# Patient Record
Sex: Female | Born: 1935 | Race: Black or African American | Hispanic: No | Smoking: Never smoker
Health system: Southern US, Community
[De-identification: ages and names within clinical notes are randomized; demographics above are authoritative.]

## PROBLEM LIST (undated history)

## (undated) ENCOUNTER — Emergency Department (HOSPITAL_COMMUNITY): Admission: EM | Payer: Medicare Other | Source: Home / Self Care

## (undated) DIAGNOSIS — I739 Peripheral vascular disease, unspecified: Secondary | ICD-10-CM

## (undated) DIAGNOSIS — F39 Unspecified mood [affective] disorder: Secondary | ICD-10-CM

## (undated) DIAGNOSIS — M1990 Unspecified osteoarthritis, unspecified site: Secondary | ICD-10-CM

## (undated) DIAGNOSIS — E7800 Pure hypercholesterolemia, unspecified: Secondary | ICD-10-CM

## (undated) DIAGNOSIS — I872 Venous insufficiency (chronic) (peripheral): Secondary | ICD-10-CM

## (undated) DIAGNOSIS — I69398 Other sequelae of cerebral infarction: Principal | ICD-10-CM

## (undated) DIAGNOSIS — R1310 Dysphagia, unspecified: Secondary | ICD-10-CM

## (undated) DIAGNOSIS — N390 Urinary tract infection, site not specified: Secondary | ICD-10-CM

## (undated) DIAGNOSIS — N189 Chronic kidney disease, unspecified: Secondary | ICD-10-CM

## (undated) DIAGNOSIS — R42 Dizziness and giddiness: Secondary | ICD-10-CM

## (undated) DIAGNOSIS — I6529 Occlusion and stenosis of unspecified carotid artery: Secondary | ICD-10-CM

## (undated) DIAGNOSIS — K219 Gastro-esophageal reflux disease without esophagitis: Secondary | ICD-10-CM

## (undated) DIAGNOSIS — T4145XA Adverse effect of unspecified anesthetic, initial encounter: Secondary | ICD-10-CM

## (undated) DIAGNOSIS — M109 Gout, unspecified: Secondary | ICD-10-CM

## (undated) DIAGNOSIS — I10 Essential (primary) hypertension: Secondary | ICD-10-CM

## (undated) DIAGNOSIS — T8859XA Other complications of anesthesia, initial encounter: Secondary | ICD-10-CM

## (undated) DIAGNOSIS — I69359 Hemiplegia and hemiparesis following cerebral infarction affecting unspecified side: Secondary | ICD-10-CM

## (undated) DIAGNOSIS — E1140 Type 2 diabetes mellitus with diabetic neuropathy, unspecified: Secondary | ICD-10-CM

## (undated) HISTORY — DX: Chronic kidney disease, unspecified: N18.9

## (undated) HISTORY — DX: Venous insufficiency (chronic) (peripheral): I87.2

## (undated) HISTORY — DX: Occlusion and stenosis of unspecified carotid artery: I65.29

## (undated) HISTORY — PX: ABDOMINAL HYSTERECTOMY: SHX81

## (undated) HISTORY — DX: Other sequelae of cerebral infarction: I69.398

## (undated) HISTORY — DX: Dizziness and giddiness: R42

## (undated) HISTORY — DX: Unspecified mood (affective) disorder: F39

## (undated) HISTORY — DX: Hemiplegia and hemiparesis following cerebral infarction affecting unspecified side: I69.359

## (undated) HISTORY — PX: CARDIAC CATHETERIZATION: SHX172

## (undated) HISTORY — DX: Unspecified osteoarthritis, unspecified site: M19.90

## (undated) HISTORY — DX: Dysphagia, unspecified: R13.10

## (undated) HISTORY — DX: Gastro-esophageal reflux disease without esophagitis: K21.9

## (undated) HISTORY — PX: HEMORRHOID BANDING: SHX5850

---

## 1997-08-10 ENCOUNTER — Emergency Department (HOSPITAL_COMMUNITY): Admission: EM | Admit: 1997-08-10 | Discharge: 1997-08-10 | Payer: Self-pay | Admitting: Emergency Medicine

## 2000-03-13 ENCOUNTER — Ambulatory Visit (HOSPITAL_COMMUNITY): Admission: RE | Admit: 2000-03-13 | Discharge: 2000-03-13 | Payer: Self-pay | Admitting: Family Medicine

## 2000-03-13 ENCOUNTER — Encounter: Payer: Self-pay | Admitting: Family Medicine

## 2000-03-18 ENCOUNTER — Emergency Department (HOSPITAL_COMMUNITY): Admission: EM | Admit: 2000-03-18 | Discharge: 2000-03-18 | Payer: Self-pay | Admitting: Emergency Medicine

## 2000-03-18 ENCOUNTER — Encounter: Payer: Self-pay | Admitting: Emergency Medicine

## 2000-03-20 ENCOUNTER — Emergency Department (HOSPITAL_COMMUNITY): Admission: EM | Admit: 2000-03-20 | Discharge: 2000-03-20 | Payer: Self-pay | Admitting: Emergency Medicine

## 2000-03-20 ENCOUNTER — Encounter: Payer: Self-pay | Admitting: Emergency Medicine

## 2000-04-10 ENCOUNTER — Encounter: Payer: Self-pay | Admitting: *Deleted

## 2000-04-10 ENCOUNTER — Emergency Department (HOSPITAL_COMMUNITY): Admission: EM | Admit: 2000-04-10 | Discharge: 2000-04-10 | Payer: Self-pay | Admitting: Emergency Medicine

## 2000-11-13 ENCOUNTER — Ambulatory Visit (HOSPITAL_COMMUNITY): Admission: RE | Admit: 2000-11-13 | Discharge: 2000-11-13 | Payer: Self-pay | Admitting: Family Medicine

## 2001-01-05 ENCOUNTER — Emergency Department (HOSPITAL_COMMUNITY): Admission: EM | Admit: 2001-01-05 | Discharge: 2001-01-05 | Payer: Self-pay

## 2001-01-09 ENCOUNTER — Encounter: Admission: RE | Admit: 2001-01-09 | Discharge: 2001-04-09 | Payer: Self-pay | Admitting: Family Medicine

## 2001-01-18 ENCOUNTER — Inpatient Hospital Stay (HOSPITAL_COMMUNITY): Admission: EM | Admit: 2001-01-18 | Discharge: 2001-01-19 | Payer: Self-pay

## 2001-04-26 ENCOUNTER — Encounter: Payer: Self-pay | Admitting: Emergency Medicine

## 2001-04-26 ENCOUNTER — Emergency Department (HOSPITAL_COMMUNITY): Admission: EM | Admit: 2001-04-26 | Discharge: 2001-04-26 | Payer: Self-pay | Admitting: Emergency Medicine

## 2001-05-27 ENCOUNTER — Emergency Department (HOSPITAL_COMMUNITY): Admission: EM | Admit: 2001-05-27 | Discharge: 2001-05-27 | Payer: Self-pay | Admitting: Emergency Medicine

## 2002-02-06 ENCOUNTER — Encounter: Payer: Self-pay | Admitting: Family Medicine

## 2002-02-06 ENCOUNTER — Ambulatory Visit (HOSPITAL_COMMUNITY): Admission: RE | Admit: 2002-02-06 | Discharge: 2002-02-06 | Payer: Self-pay | Admitting: Family Medicine

## 2004-01-25 ENCOUNTER — Encounter: Admission: RE | Admit: 2004-01-25 | Discharge: 2004-01-25 | Payer: Self-pay | Admitting: Family Medicine

## 2004-05-25 ENCOUNTER — Emergency Department (HOSPITAL_COMMUNITY): Admission: EM | Admit: 2004-05-25 | Discharge: 2004-05-25 | Payer: Self-pay | Admitting: Family Medicine

## 2004-07-23 ENCOUNTER — Observation Stay (HOSPITAL_COMMUNITY): Admission: EM | Admit: 2004-07-23 | Discharge: 2004-07-25 | Payer: Self-pay | Admitting: Emergency Medicine

## 2004-07-24 ENCOUNTER — Encounter: Payer: Self-pay | Admitting: Internal Medicine

## 2004-08-24 ENCOUNTER — Ambulatory Visit (HOSPITAL_COMMUNITY): Admission: RE | Admit: 2004-08-24 | Discharge: 2004-08-24 | Payer: Self-pay | Admitting: Orthopedic Surgery

## 2004-12-13 ENCOUNTER — Ambulatory Visit (HOSPITAL_COMMUNITY): Admission: RE | Admit: 2004-12-13 | Discharge: 2004-12-13 | Payer: Self-pay | Admitting: Neurosurgery

## 2004-12-19 ENCOUNTER — Inpatient Hospital Stay (HOSPITAL_COMMUNITY): Admission: AD | Admit: 2004-12-19 | Discharge: 2004-12-23 | Payer: Self-pay | Admitting: Internal Medicine

## 2004-12-19 ENCOUNTER — Ambulatory Visit: Payer: Self-pay | Admitting: Internal Medicine

## 2005-05-21 ENCOUNTER — Ambulatory Visit: Payer: Self-pay | Admitting: Infectious Diseases

## 2005-05-30 ENCOUNTER — Ambulatory Visit: Payer: Self-pay | Admitting: Infectious Diseases

## 2005-06-18 ENCOUNTER — Emergency Department (HOSPITAL_COMMUNITY): Admission: EM | Admit: 2005-06-18 | Discharge: 2005-06-19 | Payer: Self-pay | Admitting: Emergency Medicine

## 2005-07-23 ENCOUNTER — Emergency Department (HOSPITAL_COMMUNITY): Admission: EM | Admit: 2005-07-23 | Discharge: 2005-07-23 | Payer: Self-pay | Admitting: Emergency Medicine

## 2005-10-13 ENCOUNTER — Emergency Department (HOSPITAL_COMMUNITY): Admission: EM | Admit: 2005-10-13 | Discharge: 2005-10-14 | Payer: Self-pay | Admitting: Emergency Medicine

## 2005-10-27 ENCOUNTER — Encounter: Admission: RE | Admit: 2005-10-27 | Discharge: 2005-10-27 | Payer: Self-pay | Admitting: Neurosurgery

## 2005-12-19 ENCOUNTER — Ambulatory Visit (HOSPITAL_COMMUNITY): Admission: RE | Admit: 2005-12-19 | Discharge: 2005-12-19 | Payer: Self-pay | Admitting: Anesthesiology

## 2005-12-26 ENCOUNTER — Ambulatory Visit (HOSPITAL_COMMUNITY): Admission: RE | Admit: 2005-12-26 | Discharge: 2005-12-26 | Payer: Self-pay | Admitting: Anesthesiology

## 2006-02-22 ENCOUNTER — Encounter: Admission: RE | Admit: 2006-02-22 | Discharge: 2006-02-22 | Payer: Self-pay | Admitting: Family Medicine

## 2006-03-28 ENCOUNTER — Encounter: Admission: RE | Admit: 2006-03-28 | Discharge: 2006-03-28 | Payer: Self-pay | Admitting: Anesthesiology

## 2006-05-16 ENCOUNTER — Encounter: Admission: RE | Admit: 2006-05-16 | Discharge: 2006-05-16 | Payer: Self-pay | Admitting: Family Medicine

## 2006-05-19 ENCOUNTER — Encounter: Admission: RE | Admit: 2006-05-19 | Discharge: 2006-05-19 | Payer: Self-pay | Admitting: Family Medicine

## 2006-06-05 ENCOUNTER — Ambulatory Visit: Payer: Self-pay | Admitting: Vascular Surgery

## 2006-06-06 ENCOUNTER — Ambulatory Visit (HOSPITAL_COMMUNITY): Admission: RE | Admit: 2006-06-06 | Discharge: 2006-06-06 | Payer: Self-pay | Admitting: Gastroenterology

## 2006-06-06 ENCOUNTER — Encounter (INDEPENDENT_AMBULATORY_CARE_PROVIDER_SITE_OTHER): Payer: Self-pay | Admitting: Specialist

## 2006-08-22 ENCOUNTER — Inpatient Hospital Stay (HOSPITAL_COMMUNITY): Admission: EM | Admit: 2006-08-22 | Discharge: 2006-08-26 | Payer: Self-pay | Admitting: Emergency Medicine

## 2006-08-23 ENCOUNTER — Encounter (INDEPENDENT_AMBULATORY_CARE_PROVIDER_SITE_OTHER): Payer: Self-pay | Admitting: Gastroenterology

## 2006-08-28 ENCOUNTER — Ambulatory Visit: Payer: Self-pay | Admitting: Internal Medicine

## 2007-06-10 ENCOUNTER — Emergency Department (HOSPITAL_COMMUNITY): Admission: EM | Admit: 2007-06-10 | Discharge: 2007-06-10 | Payer: Self-pay | Admitting: *Deleted

## 2008-02-06 ENCOUNTER — Emergency Department (HOSPITAL_COMMUNITY): Admission: EM | Admit: 2008-02-06 | Discharge: 2008-02-06 | Payer: Self-pay | Admitting: Emergency Medicine

## 2008-05-04 ENCOUNTER — Emergency Department (HOSPITAL_COMMUNITY): Admission: EM | Admit: 2008-05-04 | Discharge: 2008-05-04 | Payer: Self-pay | Admitting: Emergency Medicine

## 2008-06-21 ENCOUNTER — Emergency Department (HOSPITAL_COMMUNITY): Admission: EM | Admit: 2008-06-21 | Discharge: 2008-06-22 | Payer: Self-pay | Admitting: Emergency Medicine

## 2009-01-29 ENCOUNTER — Emergency Department (HOSPITAL_COMMUNITY): Admission: EM | Admit: 2009-01-29 | Discharge: 2009-01-29 | Payer: Self-pay | Admitting: Emergency Medicine

## 2009-02-05 ENCOUNTER — Emergency Department (HOSPITAL_COMMUNITY): Admission: EM | Admit: 2009-02-05 | Discharge: 2009-02-05 | Payer: Self-pay | Admitting: Emergency Medicine

## 2009-03-11 ENCOUNTER — Emergency Department (HOSPITAL_COMMUNITY): Admission: EM | Admit: 2009-03-11 | Discharge: 2009-03-12 | Payer: Self-pay | Admitting: Emergency Medicine

## 2009-03-22 ENCOUNTER — Emergency Department (HOSPITAL_COMMUNITY): Admission: EM | Admit: 2009-03-22 | Discharge: 2009-03-22 | Payer: Self-pay | Admitting: Emergency Medicine

## 2009-12-24 ENCOUNTER — Emergency Department (HOSPITAL_COMMUNITY): Admission: EM | Admit: 2009-12-24 | Discharge: 2009-12-24 | Payer: Self-pay | Admitting: Emergency Medicine

## 2010-03-26 ENCOUNTER — Encounter: Payer: Self-pay | Admitting: Family Medicine

## 2010-05-17 LAB — BASIC METABOLIC PANEL
CO2: 28 mEq/L (ref 19–32)
Calcium: 9.5 mg/dL (ref 8.4–10.5)
Creatinine, Ser: 1.13 mg/dL (ref 0.4–1.2)
Glucose, Bld: 136 mg/dL — ABNORMAL HIGH (ref 70–99)
Sodium: 143 mEq/L (ref 135–145)

## 2010-05-17 LAB — DIFFERENTIAL
Basophils Relative: 0 % (ref 0–1)
Eosinophils Absolute: 0.1 10*3/uL (ref 0.0–0.7)
Eosinophils Relative: 2 % (ref 0–5)
Monocytes Relative: 5 % (ref 3–12)
Neutrophils Relative %: 64 % (ref 43–77)

## 2010-05-17 LAB — URINALYSIS, ROUTINE W REFLEX MICROSCOPIC
Bilirubin Urine: NEGATIVE
Glucose, UA: NEGATIVE mg/dL
Hgb urine dipstick: NEGATIVE
Ketones, ur: NEGATIVE mg/dL
Protein, ur: NEGATIVE mg/dL

## 2010-05-17 LAB — CBC
Hemoglobin: 13.3 g/dL (ref 12.0–15.0)
MCH: 29.8 pg (ref 26.0–34.0)
MCHC: 33.3 g/dL (ref 30.0–36.0)
MCV: 89.7 fL (ref 78.0–100.0)

## 2010-05-20 LAB — URINALYSIS, ROUTINE W REFLEX MICROSCOPIC
Glucose, UA: 250 mg/dL — AB
Hgb urine dipstick: NEGATIVE
Ketones, ur: 15 mg/dL — AB
Specific Gravity, Urine: 1.028 (ref 1.005–1.030)
pH: 5 (ref 5.0–8.0)

## 2010-05-20 LAB — CBC
HCT: 39.6 % (ref 36.0–46.0)
Hemoglobin: 13.1 g/dL (ref 12.0–15.0)
MCV: 91.2 fL (ref 78.0–100.0)
Platelets: 166 10*3/uL (ref 150–400)
RDW: 16 % — ABNORMAL HIGH (ref 11.5–15.5)

## 2010-05-20 LAB — URINE MICROSCOPIC-ADD ON

## 2010-05-20 LAB — COMPREHENSIVE METABOLIC PANEL
Albumin: 3.3 g/dL — ABNORMAL LOW (ref 3.5–5.2)
Alkaline Phosphatase: 51 U/L (ref 39–117)
BUN: 30 mg/dL — ABNORMAL HIGH (ref 6–23)
Creatinine, Ser: 1.68 mg/dL — ABNORMAL HIGH (ref 0.4–1.2)
Glucose, Bld: 197 mg/dL — ABNORMAL HIGH (ref 70–99)
Potassium: 3.2 mEq/L — ABNORMAL LOW (ref 3.5–5.1)
Total Bilirubin: 0.7 mg/dL (ref 0.3–1.2)
Total Protein: 7.3 g/dL (ref 6.0–8.3)

## 2010-05-20 LAB — URINE CULTURE

## 2010-05-20 LAB — DIFFERENTIAL
Basophils Absolute: 0 10*3/uL (ref 0.0–0.1)
Basophils Relative: 0 % (ref 0–1)
Lymphocytes Relative: 13 % (ref 12–46)
Monocytes Absolute: 0.4 10*3/uL (ref 0.1–1.0)
Neutro Abs: 9.6 10*3/uL — ABNORMAL HIGH (ref 1.7–7.7)
Neutrophils Relative %: 83 % — ABNORMAL HIGH (ref 43–77)

## 2010-05-21 LAB — COMPREHENSIVE METABOLIC PANEL
ALT: 32 U/L (ref 0–35)
AST: 32 U/L (ref 0–37)
Alkaline Phosphatase: 59 U/L (ref 39–117)
CO2: 28 mEq/L (ref 19–32)
Calcium: 9.1 mg/dL (ref 8.4–10.5)
Chloride: 102 mEq/L (ref 96–112)
GFR calc Af Amer: >60 mL/min (ref >60)
GFR calc non Af Amer: 52 mL/min — ABNORMAL LOW (ref >60)
Glucose, Bld: 142 mg/dL — ABNORMAL HIGH (ref 70–99)
Potassium: 3.7 mEq/L (ref 3.5–5.1)
Sodium: 140 mEq/L (ref 135–145)
Total Bilirubin: 0.1 mg/dL — ABNORMAL LOW (ref 0.3–1.2)

## 2010-05-21 LAB — GLUCOSE, CAPILLARY: Glucose-Capillary: 136 mg/dL — ABNORMAL HIGH (ref 70–99)

## 2010-05-21 LAB — URINALYSIS, ROUTINE W REFLEX MICROSCOPIC
Nitrite: NEGATIVE
Specific Gravity, Urine: 1.015 (ref 1.005–1.030)
Urobilinogen, UA: 0.2 mg/dL (ref 0.0–1.0)
pH: 7.5 (ref 5.0–8.0)

## 2010-05-21 LAB — DIFFERENTIAL
Basophils Relative: 0 % (ref 0–1)
Eosinophils Absolute: 0.2 10*3/uL (ref 0.0–0.7)
Eosinophils Relative: 3 % (ref 0–5)
Lymphs Abs: 1.4 10*3/uL (ref 0.7–4.0)

## 2010-05-21 LAB — CBC
Hemoglobin: 12.7 g/dL (ref 12.0–15.0)
RBC: 4.19 MIL/uL (ref 3.87–5.11)
WBC: 6.4 10*3/uL (ref 4.0–10.5)

## 2010-06-06 LAB — DIFFERENTIAL
Basophils Relative: 0 % (ref 0–1)
Lymphocytes Relative: 39 % (ref 12–46)
Monocytes Relative: 8 % (ref 3–12)
Neutro Abs: 3.3 10*3/uL (ref 1.7–7.7)
Neutrophils Relative %: 49 % (ref 43–77)

## 2010-06-06 LAB — CBC
Hemoglobin: 13.5 g/dL (ref 12.0–15.0)
MCHC: 33.3 g/dL (ref 30.0–36.0)
RBC: 4.44 MIL/uL (ref 3.87–5.11)
WBC: 6.8 10*3/uL (ref 4.0–10.5)

## 2010-06-06 LAB — URINALYSIS, ROUTINE W REFLEX MICROSCOPIC
Nitrite: NEGATIVE
Specific Gravity, Urine: 1.014 (ref 1.005–1.030)
Urobilinogen, UA: 0.2 mg/dL (ref 0.0–1.0)
pH: 7.5 (ref 5.0–8.0)

## 2010-06-06 LAB — BASIC METABOLIC PANEL
BUN: 17 mg/dL (ref 6–23)
Calcium: 9 mg/dL (ref 8.4–10.5)
Chloride: 102 mEq/L (ref 96–112)
Creatinine, Ser: 1.06 mg/dL (ref 0.4–1.2)

## 2010-06-06 LAB — URINE MICROSCOPIC-ADD ON

## 2010-06-06 LAB — POCT CARDIAC MARKERS
CKMB, poc: 3.2 ng/mL (ref 1.0–8.0)
Myoglobin, poc: 171 ng/mL (ref 12–200)

## 2010-06-07 LAB — DIFFERENTIAL
Basophils Absolute: 0 10*3/uL (ref 0.0–0.1)
Eosinophils Absolute: 0.2 10*3/uL (ref 0.0–0.7)
Eosinophils Relative: 3 % (ref 0–5)
Lymphs Abs: 2.7 10*3/uL (ref 0.7–4.0)

## 2010-06-07 LAB — URINALYSIS, ROUTINE W REFLEX MICROSCOPIC
Bilirubin Urine: NEGATIVE
Nitrite: NEGATIVE
Specific Gravity, Urine: 1.019 (ref 1.005–1.030)
pH: 7.5 (ref 5.0–8.0)

## 2010-06-07 LAB — POCT CARDIAC MARKERS
CKMB, poc: 4.9 ng/mL (ref 1.0–8.0)
Myoglobin, poc: 210 ng/mL (ref 12–200)
Troponin i, poc: <0.05 ng/mL (ref 0.00–0.09)

## 2010-06-07 LAB — LIPASE, BLOOD: Lipase: 36 U/L (ref 11–59)

## 2010-06-07 LAB — COMPREHENSIVE METABOLIC PANEL
ALT: 24 U/L (ref 0–35)
AST: 31 U/L (ref 0–37)
Alkaline Phosphatase: 62 U/L (ref 39–117)
CO2: 29 mEq/L (ref 19–32)
Chloride: 106 mEq/L (ref 96–112)
GFR calc Af Amer: 50 mL/min — ABNORMAL LOW (ref >60)
GFR calc non Af Amer: 41 mL/min — ABNORMAL LOW (ref >60)
Potassium: 3.8 mEq/L (ref 3.5–5.1)
Sodium: 142 mEq/L (ref 135–145)
Total Bilirubin: 0.2 mg/dL — ABNORMAL LOW (ref 0.3–1.2)

## 2010-06-07 LAB — CBC
RBC: 4.4 MIL/uL (ref 3.87–5.11)
WBC: 6.1 10*3/uL (ref 4.0–10.5)

## 2010-06-15 LAB — D-DIMER, QUANTITATIVE

## 2010-07-18 NOTE — H&P (Signed)
NAME:  [name], [name] NO.:  [id number]   MEDICAL RECORD NO.:  [id number]          PATIENT TYPE:  INP   LOCATION:  1232                         FACILITY:  [facility]   PHYSICIAN:  Beckey Rutter, MD  DATE OF BIRTH:  Apr 02, 1917   DATE OF ADMISSION:  08/22/2006  DATE OF DISCHARGE:                              HISTORY & PHYSICAL   CHIEF COMPLAINT:  Nausea and vomiting of blood.   HISTORY OF PRESENT ILLNESS:  This is an 77 year old female with past  medical history significant for diabetes, hypertension, and gout,  brought in today by EMS because of vomiting of blood.  The patient was  in her usual state of health up to about the middle of the day when she  started to suddenly feel abdominal discomfort and pain together with  nausea.  The patient then moved to her neighbor's house because she was  living alone and she was scared that she was not feeling well all of a  sudden, and there in her neighbor's place she started to vomit  profusely, black vomitus.  Then, the EMS was called, and the EMS stated  that the patient had blood-colored vomitus.  The patient then came to  the emergency room.  Here in the emergency room, she had a transient  period of blood pressure drop systolic to 83/49 from the first blood  pressure measured at 17:40 of 114/63.  On her review of systems, the  patient denied a similar condition, but stated that she had recently had  an upper endoscopy, and she could not tell the exact indication for the  endoscopy.  Endoscopy done by Dr. Elnoria Howard.  She denied headache, fever, or  diarrhea.   PAST MEDICAL HISTORY:  1. Diabetes.  2. Hypertension.  3. Hyperlipidemia.  4. Gout.   FAMILY HISTORY:  Her father's side is significant for diabetes.   SOCIAL HISTORY:  The patient is originally from [place].  She lives  alone.  Denied ethanol or tobacco or drug abuse.   DRUG ALLERGIES:  Not known to have drug allergies.   MEDICATIONS:  1. Allopurinol 300  mg once a day.  2. Glyburide 1.2/25 twice a day.  3. Norvasc 10 mg daily.  4. Aspirin 325 mg daily.   REVIEW OF SYSTEMS:  As per HPI.   PHYSICAL EXAMINATION:  VITAL SIGNS:  Temperature:  The last reading a is  97.3, blood pressure 119/68, pulse 69, respiratory rate 18.  GENERAL:  The patient was lying flat in bed. in no acute distress.  HEENT:  Head atraumatic, normocephalic.  Eyes: PERRL.  Mouth moist.  No  ulcer.  NECK:  Supple.  No JVD.  CHEST:  First and second heart sounds audible.  No added sounds.  LUNGS:  Bilateral fair air entry.  ABDOMEN:  Soft, nontender.  Bowel sounds present.  GU:  No CVA tenderness.  EXTREMITIES:  No lower extremity edema.  SKIN:  No significant hypo- or hyperpigmentation.  NEUROLOGIC:  The patient is alert, oriented x3, and moving all her  extremities spontaneously.   LABORATORIES:  White blood count 8.4, hemoglobin 10.9,  hematocrit 33.3,  platelets 209.  Sodium 146, potassium 3.8, chloride 106, carbon dioxide  34, glucose 140, BUN 51, creatinine 1.37, calcium 8.9, total protein  5.8, albumin 2.9.  Prothrombin time 13.4, INR 1, activated partial  thromboplastin time 25 seconds.  Occult blood positive.  Chest x-ray and  EKG pending.   ASSESSMENT AND PLAN:  1. This is an 77 year old with upper GI bleeding.  The patient will be      admitted for further assessment and management.  2. The patient will be admitted to step-down unit, hence the history      of hypotension here, for continuous monitoring of her blood      pressure.  The patient will have 2 units of blood/packed RBCs      prepared, but I will not transfuse yet, pending the monitoring of      her H&H.  I will start the patient on Protonix IV q.12 h., with a      stat dose.  The patient will remain n.p.o., and I will call GI      consultation, Dr. Elnoria Howard is her primary gastroenterologist, for      further assessment and EGD consideration.  I will obtain two large-      bore IV line.  We will  monitor H&H closely, and we will transfuse      blood as needed.  3. For her hypertension, the patient was taking Norvasc 10 mg once a      day.  I will hold the antihypertensive medication for now, since      the patient has history of transient hypotension here, pending      further testing for her upper GI bleeding.  I will hold aspirin as      well.  The patient was taking aspirin on a daily basis.  4. Diabetes.  The patient was taking glyburide.  I will discontinue      the glyburide tonight, and when the patient is n.p.o. I will      consider putting her back on her same dose of glyburide.  I will      also put the patient on a sliding scale of regular insulin      sensitive without night coverage, and we will obtain hemoglobin      A1c.  5. Gout.  The patient has no symptoms of gout now.  She is n.p.o.      Will start allopurinol when the patient is stable for diet.  6. For DVT prophylaxis, I will consider PAS.      Beckey Rutter, MD  Electronically Signed     EME/MEDQ  D:  08/22/2006  T:  08/23/2006  Job:  [id number]

## 2010-07-18 NOTE — Discharge Summary (Signed)
NAME:  [name], [name] NO.:  [id number]   MEDICAL RECORD NO.:  [id number]          PATIENT TYPE:  INP   LOCATION:  1410                         [facility]ACILITY:  [facility]   PHYSICIAN:  Altha Harm, MDDATE OF BIRTH:  [birth year]   DATE OF ADMISSION:  08/22/2006  DATE OF DISCHARGE:  08/26/2006                               DISCHARGE SUMMARY   DISCHARGE DISPOSITION:  Home.   FINAL DISCHARGE DIAGNOSES:  1. Gastrointestinal bleeding.  2. Acute blood loss anemia.  3. Diabetes type 2.  4. Hypertension.  5. History of gout.   DISCHARGE MEDICATIONS:  1. Allopurinol 300 mg p.o. daily.  2. Glucovance 12.5/250 mg p.o. b.i.d.  3. Norvasc 10 mg p.o. daily.  4. Vytorin 10/20 mg p.o. daily.   DISCONTINUED MEDICATIONS:  1. Aspirin 325 mg p.o. daily.  2. Tylenol Arthritis.   NEW MEDICATIONS:  Protonix 40 mg p.o. b.i.d.   CONSULTANT:  Dr. Elnoria Howard, Gastroenterology.   PROCEDURES:  Esophagogastroduodenoscopy, which showed erosive gastritis,  hiatal hernia and a normal duodenum, essentially unchanged from the  prior esophagogastroduodenoscopy.   DIAGNOSTIC STUDIES:  Portable chest x-ray done on June 19 which showed  no acute disease.  Impression:  Cardiomegaly and markedly tortuous  aorta.   CODE STATUS:  Full code.   ALLERGIES:  No known drug allergies.   PRIMARY CARE PHYSICIAN:  Dr. Renaye Rakers.   CHIEF PRESENTING COMPLAINT:  Hematemesis.   HISTORY OF PRESENT ILLNESS:  Please see the H&P dictated by Dr. Tamsen Roers  for details of the HPI.   HOSPITAL COURSE:  PROBLEM #1 - GASTROINTESTINAL BLEEDING:  The patient  was given bowel rest and placed on proton pump inhibitors.  Gastroenterology was consulted and an EGD was performed with the above-  stated findings.  The patient's hemoglobin stabilized at approximately  8.4 to 8.8 and remained stable throughout her hospital stay.  The  patient was resumed on diet and advanced to a full diet, which she  tolerated without any  difficulty.   PROBLEM #2 - ACUTE BLOOD LOSS ANEMIA:  The patient had acute blood loss  anemia secondary to her GI bleed.  The source of bleeding was not really  identified in the gastrointestinal investigations.  The patient did not  receive any transfusions, as her hemoglobin stabilized, and the patient  was asymptomatic as a result of it.  Please note that this patient may  need to have endoscopic camera to further investigate the source of  bleeding in her gastrointestinal tract.   PROBLEM #3 - DIABETES, TYPE 2:  The patient was resumed on a diet and  thus her Glucovance was also resumed with blood sugars under moderate  control.   PROBLEM #4 - HYPERTENSION:  The patient was resumed on her Norvasc 10 mg  with some moderate degree of control.   The patient tolerated her diet without any difficulty.  The patient has  had bowel movements since starting diet without any evidence of blood.   CONDITION ON DISCHARGE:  Stable.   DIETARY RESTRICTIONS:  The patient should be on a low-fiber diet.   PHYSICAL RESTRICTIONS:  None.  FOLLOWUP:  The patient is to follow up with Dr. Renaye Rakers in 1 week.      Altha Harm, MD  Electronically Signed     MAM/MEDQ  D:  08/26/2006  T:  08/27/2006  Job:  [id number]   cc:   Renaye Rakers, M.D.  Fax: [phone]   Jordan Hawks. Elnoria Howard, MD  Fax: [phone]

## 2010-07-18 NOTE — Consult Note (Signed)
NAME:  [name], [name] NO.:  [id number]   MEDICAL RECORD NO.:  [id number]          PATIENT TYPE:  INP   LOCATION:  1232                         FACILITY:  [facility]   PHYSICIAN:  Jordan Hawks. Elnoria Howard, MD    DATE OF BIRTH:  1917/07/01   DATE OF CONSULTATION:  08/23/2006  DATE OF DISCHARGE:                                 CONSULTATION   PRIMARY CARE Onisha Cedeno:  Renaye Rakers, M.D.   HISTORY OF PRESENT ILLNESS:  This is an 77 year old female with a past  medical history of hypertension, diabetes, hyperlipidemia, and gout who  was admitted to the hospital with complaints of hematemesis.  The  patient reports feeling well when she fell asleep and subsequently woke  up with complaints of feeling sick and subsequently vomited black  stuff.  At that point this was of concern.  She subsequently presented  to the emergency room for further evaluation and treatment.  Prior to  this time, approximately 1 week ago, she had complained of having  abdominal discomfort.  She was recommended to take Tylenol.  She denies  taking any other and NSAIDs for this abdominal pain.  To her knowledge,  she denies seeing any melena.  No reports of hematochezia or  hematemesis.  When the patient arrived to the emergency room, she was  noted to have a systolic blood pressure of 83/59, however, it has  subsequently increased with supportive care.  In April 2008, she  underwent an EGD, by Dr. Elnoria Howard, with findings of a mild gastritis and  surprisingly the biopsies were negative for any active inflammation.  She had undergone the EGD for findings of a abnormal CT scan of the  antrum.   PAST MEDICAL AND SURGICAL HISTORY:  As stated above.   FAMILY HISTORY:  Noncontributory.   SOCIAL HISTORY:  Negative for alcohol, tobacco, illicit drug use.   ALLERGIES:  NO KNOWN DRUG ALLERGIES.   MEDICATIONS:  1. Norvasc 10 mg p.o. daily.  2. Aspirin 325 mg p.o. daily.  3. Glyburide 1/2 tablet p.o. b.i.d.  4.  Allopurinol 300 mg p.o. daily.   REVIEW OF SYSTEMS:  As stated above in the history of present illness,  otherwise negative.   PHYSICAL EXAMINATION:  VITAL SIGNS:  Blood pressure is 119/68, heart  rate is 69, respirations 18, temperature is 97.3.  GENERAL:  The patient is in no acute distress, alert and oriented.  HEENT:  Normocephalic, atraumatic.  Extraocular muscles intact.  NECK:  Supple.  No lymphadenopathy.  LUNGS:  Clear to auscultation bilaterally.  CARDIOVASCULAR:  Regular rate rhythm.  ABDOMEN:  Flat, soft, nontender, nondistended.  EXTREMITIES:  No clubbing, cyanosis or edema.  RECTAL:  Positive for melena and heme-positive.   LABORATORY VALUES:  White blood cell count is 8.4, hemoglobin 10.7, MCV  is 90.9, platelets at 209.  PT is 13.4, INR 1, PTT 25.  Sodium 146,  potassium 3.8, chloride 106, CO2 34, glucose 140, BUN 51, creatinine  1.3.  Alk phos is 45, AST 22, ALT 19, total bilirubin 0.6.   IMPRESSION:  1. Possible upper gastrointestinal bleed.  2. History  of gastritis.   PLAN:  1. In light of her symptoms, it is not unreasonable to evaluate the      patient with a repeat EGD at this time.  Further recommendations      will be made after the EGD.  2. Agree with the use of PTIs      Jordan Hawks. Elnoria Howard, MD  Electronically Signed     PDH/MEDQ  D:  08/23/2006  T:  08/23/2006  Job:  [id number]   cc:   Renaye Rakers, M.D.  Fax: [phone]

## 2010-07-21 NOTE — Discharge Summary (Signed)
NAME:  [name], [name] NO.:  [id number]   MEDICAL RECORD NO.:  [id number]          PATIENT TYPE:  OBV   LOCATION:  6737                         FACILITY:  [facility]   PHYSICIAN:  Hettie Holstein, D.O.    DATE OF BIRTH:  [birth year]   DATE OF ADMISSION:  07/23/2004  DATE OF DISCHARGE:  07/25/2004                                 DISCHARGE SUMMARY   ADMISSION DIAGNOSES:  1.  Orthostasis.  2.  Dehydration.   DIAGNOSES AT TIME OF DISCHARGE:  1.  Volume depletion and dehydration, status post IV fluids administration      this admission.  Status post evaluation this admission with 2-D echo      revealing normal left ventricular function.  2.  Poorly-controlled diabetes mellitus with hemoglobin A1c 9.0.  3.  Constipation.  4.  Hypokalemia secondary to volume depletion.   MEDICATIONS ON DISCHARGE:  1.  Vytorin 10/20 mg daily as before.  2.  Glucovance 1.25/250 b.i.d.  3.  Allopurinol 300 mg daily as before.  4.  Avalide.  The patient was instructed to hold this one week following      discharge, and she was instructed to see her primary care physician,      Renaye Rakers, M.D., before restarting this medication.  5.  Norvasc 5 mg daily until seen by her primary care physician.  6.  K-Lor/K-Dur 40 mEq daily x5 days following discharge.   DISPOSITION:  The patient was discharged, not to drive until seen by primary  care physician.  Diet was recommended to be moderate consisting of  carbohydrates.  She is instructed to check her lab values in one week.  The  patient was instructed to follow up with Dr. Renaye Rakers within a week to  discuss diabetes management.   HISTORY OF PRESENTING ILLNESS:  Briefly, [name] is a pleasant 77-year-  old female from [place], who presented to the emergency room with  complaints of dizziness and states that she had collapsed.  She states that  she felt quite dizzy.  She vomited in the a.m. as well, but she had no  problems previously.   She denied any chest pain or shortness of breath.  Otherwise her only complaints were that of urinary frequency and bloating as  well as flatulence.  She has a significant history for diabetes mellitus.  She had not been taking insulin; however, she has been taking medications  though she did not know what these were at the time of evaluation in the  emergency department.  She was admitted for IV hydration and 23-hour  observation.   HOSPITAL COURSE:  The patient was provided with IV fluids.  Her course was  that of improvement.  Studies as noted above revealed poorly-controlled  diabetes with a hemoglobin A1C of 9.0.  She responded quite well to IV  hydration and was asymptomatic and was felt to be improved and medically  stable for discharge and close follow-up with her primary care physician.       ESS/MEDQ  D:  10/04/2004  T:  10/05/2004  Job:  [id number]  cc:   Renaye Rakers, M.D.  [phone] [address]  [place]  [place] [address]  Fax: [phone]

## 2010-07-21 NOTE — Discharge Summary (Signed)
NAME:  [name], [name] NO.:  [id number]   MEDICAL RECORD NO.:  [id number]          PATIENT TYPE:  INP   LOCATION:  3036                         FACILITY:  [facility]   PHYSICIAN:  Cristi Loron, M.D.DATE OF BIRTH:  1917/09/15   DATE OF ADMISSION:  12/19/2004  DATE OF DISCHARGE:  12/23/2004                                 DISCHARGE SUMMARY   HISTORY OF PRESENT ILLNESS:  For full details of this admission, please  refer to typed history and physical.   HOSPITAL COURSE:  The patient was admitted with diagnosis of a spontaneous  diskitis.  The patient had a needle biopsy of this and had Dr. Cliffton Asters  see the patient.  They recommended placement of PICC line and treatment with  home intravenous Ancef.  These arrangements were made and the patient was  discharged to home on December 23, 2004.   FINAL DIAGNOSES:  1.  L3-L4 diskitis.  2.  Diabetes mellitus type 2.  3.  Hypertension.  4.  Hyperlipidemia.  5.  Gout.  6.  Osteoarthritis.  7.  Disc degeneration.   PROCEDURE PERFORMED:  Needle biopsy of L3-L4 diskitis.   DISCHARGE INSTRUCTIONS:  The patient was given written discharge  instructions and is instructed to follow up with me and Dr. Cliffton Asters.      Cristi Loron, M.D.  Electronically Signed     JDJ/MEDQ  D:  02/01/2005  T:  02/01/2005  Job:  [id number]   cc:   Cliffton Asters, M.D.  Fax: [phone]

## 2010-07-21 NOTE — Discharge Summary (Signed)
NAME:  [name], [name] NO.:  [id number]   MEDICAL RECORD NO.:  [id number]          PATIENT TYPE:  OBV   LOCATION:  6737                         FACILITY:  [facility]   PHYSICIAN:  Hettie Holstein, D.O.    DATE OF BIRTH:  11-14-1917   DATE OF ADMISSION:  07/23/2004  DATE OF DISCHARGE:  07/25/2004                                 DISCHARGE SUMMARY   PRIMARY CARE PHYSICIAN:  Renaye Rakers, M.D.   ADMISSION DIAGNOSES:  1.  Orthostasis and dehydration.  2.  Poorly controlled diabetes.   DIAGNOSES AT THE TIME OF DISCHARGE:  1.  Orthostasis and near syncopal episode felt to be due to volume depletion      in the setting of hyperglycemia, polyuria and poorly-controlled diabetes      with dehydration.  Adequate response to volume repletion and adequate      control of her diabetes.  2.  Hypertension.  At present we are holding her antihypertensive      medications as she was volume depleted.  I have asked that she followup      with her primary care physician in the outpatient setting this week to      resume these if she  achieves her baseline.  3.  Urinary tract infection.  Status post treatment with Cipro to completion      this hospitalization.   DISCHARGE MEDICATIONS:  1.  Vytorin 10/20 daily as before.  2.  Glucovance was increased to 1.25 mg/250 to twice daily with      recommendations to increase this over the course of this coming week      possibly to two tablets b.i.d.  I have instructed her to followup with      her primary care physician to optimize her diabetic management in the      outpatient setting perhaps with home glucose monitoring.  3.  Allopurinol 300 mg daily as before.  4.  Avalide. The patient was instructed to hold her Avalide for one week      until she can see her primary care physician.  5.  She was instructed to decrease her Norvasc to 5 mg daily until she sees      her primary care physician.  6.  She is given five days of potassium supplement  as she is hypokalemic at      present.   DISPOSITION:  [name] is medically stable for discharge home with the  recommendations to followup with Dr. Renaye Rakers this week and have a  followup basic metabolic panel in addition and resume her antihypertensive  medications and further up titrate her diabetic medications.   HISTORY OF PRESENT ILLNESS:  [name] is a pleasant 77 year old female  who presented to the emergency department after she had awoken around 5 a.m.  and collapsed.  She stated she had felt dizzy when she woke up.  She had  some vomiting in the morning as well and some increased urinary frequency.  In the emergency department she was found to be orthostatic.  She was  admitted for IV hydration and  further evaluation.   HOSPITAL COURSE:  The patient was admitted and her course was uneventful.  She underwent 2-D echocardiogram that revealed normal left ventricular  ejection fraction as well as a normal chest x-ray.  Her  hemoglobin A1C, however, was 9.0, her LDL was 75 range, HDL was 45,  hemoglobin 10.6 with an MCV of 87.  Glucose monitoring in the inpatient  setting revealed a poor control with her oral dosing of Glucovance.  She is  recommended to increase this to twice daily and followup with her primary  care physician.       ESS/MEDQ  D:  07/25/2004  T:  07/25/2004  Job:  [id number]   cc:   Renaye Rakers, M.D.  [phone] [address], [address]  [place]  [place] [address]  Fax: [phone]

## 2010-07-21 NOTE — H&P (Signed)
NAME:  SHANIQUIA, BRAFFORD NO.:  [id number]   MEDICAL RECORD NO.:  [id number]          PATIENT TYPE:  OBV   LOCATION:  1826                         FACILITY:  [facility]   PHYSICIAN:  Hettie Holstein, D.O.    DATE OF BIRTH:  1917/05/19   DATE OF ADMISSION:  07/23/2004  DATE OF DISCHARGE:                                HISTORY & PHYSICAL   PRIMARY CARE PHYSICIAN:  Renaye Rakers, M.D.   CHIEF COMPLAINT:  Collapsed at home.   HISTORY OF PRESENT ILLNESS:  Ms. Gawronski is a pleasant 77 year old female  from [place] who presented to the emergency room today via ambulance she  had awoken this morning around 5 a.m., ambulated to the kitchen, and  collapsed. She stated that she felt quite dizzy. She had vomited this  morning as well. She had no problems yesterday and denied any chest pain or  shortness of breath. Otherwise, the only complaints include urinary  frequency, bloating, as well as flatulence.   PAST MEDICAL HISTORY:  Significant only for diabetes mellitus.  She states  she does not take insulin, however her complete list of medications is not  available in the bag full of medications she has brought with her to the  emergency department. No known coronary artery disease or cerebrovascular  disease. No prior history of irregular heart beat. She did have previous  shoulder surgery and has been taking medication for sciatica for several  months now. She states that this has been quite severe. She also has gout.   MEDICATIONS:  This is an incomplete list. The family states that the bag  that they have brought in is not all of her medications. She goes to [facility] on [address], whose phone number is [phone]. However, they are not  open at this time. We will defer this to the rounding physician. She takes  allopurinol 300 mg daily, Norvasc 10 mg daily, terazosin 2 mg q.h.s., and  Vytorin 10/20 daily. Again a complete list is pending reconciliation with  her pharmacy as  well as her primary care physician.   SOCIAL HISTORY:  The patient denies tobacco. She is from [place]. She was  formerly a Advertising account executive. She has four children. Her husband is deceased.   FAMILY HISTORY:  Significant for diabetes in her father who passed away due  to unattended foot infection. Her mother died at age 30 with no known  medical problems.   ALLERGIES:  No known drug allergies.   REVIEW OF SYSTEMS:  The patient reports that she has some flatulence. No  nausea. Only one episode of vomiting. No anorexia. No weight loss. She has  some bowel discomfort and distention. No reports of constipation,  hematochezia, coffee ground emesis, or melena. She does have intermittent  swelling of her lower extremities.  Otherwise, there are no other complaints  with the exception of urinary frequency.   PHYSICAL EXAMINATION:  VITAL SIGNS: Her blood pressure initially was 91/65  in the emergency department. She was tachycardic with a heart rate in the  120s to 130s. Dr. Stacie Acres requested admission. It was felt  that she was in  atrial fibrillation, however after 500 cc of normal saline bolus her blood  pressure normalized to 90s to 100s and her heart rate subsequently responded  to normal sinus rhythm in the 80s. She was afebrile with a temperature of  97.5, respirations 16, O2 saturation 95%.  GENERAL: The patient is a pleasant, alert, completely oriented 77 year old  spry female in no acute distress.  HEENT: Head is normocephalic and atraumatic. Extraocular muscles are intact.  Oropharynx is clear.  NECK: Supple and nontender.  CARDIOVASCULAR: Normal S1 and S2 without appreciable murmurs, rubs, gallops,  or carotid bruits.  LUNGS: Clear to auscultation bilaterally. There is normal effort. There is  no dullness to percussion.  ABDOMEN: Slightly distended. No shift in dullness. She does have normoactive  bowel sounds.  EXTREMITIES: She exhibits trace lower extremity edema, nonpitting.   Peripheral pulses are palpable and symmetrical.  NEUROLOGIC: The patient is alert and oriented. She is euthymic and her  affect is stable.   LABORATORY DATA:  Only I-STAT is available in the emergency department. She  had a sodium of 141, potassium 3, BUN 26, creatinine 0.9.   Chest x-ray reveals mild vascular congestion, increased heart size, no acute  findings are noted. EKG #1 revealed normal sinus rhythm with a left anterior  fascicular block. EKG #2 revealed sinus tachycardia. Glucose was elevated  over 200 in the emergency room as well.   ASSESSMENT:  1.  Orthostasis.  2.  Dehydration/volume depletion.  3.  Diabetes mellitus, I suspect poorly controlled.  4.  Hypertension history.  5.  Hypercholesterolemia history.   PLAN:  At this time we are going to admit Ms. Barkalow to outpatient  observation and follow her course overnight. Replete IV fluids. Replete her  potassium and electrolytes. Check her hemoglobin A1C. Check her urinalysis  and treat accordingly. Follow her course clinically. Check a KUB and  administer some Reglan caps to assist with dysmotility as a cause for her  abdominal distention.      ESS/MEDQ  D:  07/23/2004  T:  07/23/2004  Job:  [id number]   cc:   Renaye Rakers, M.D.  [phone] [address]  [place]  [place] [address]  Fax: [phone]

## 2010-10-22 ENCOUNTER — Emergency Department (HOSPITAL_COMMUNITY): Payer: Medicare Other

## 2010-10-22 ENCOUNTER — Emergency Department (HOSPITAL_COMMUNITY)
Admission: EM | Admit: 2010-10-22 | Discharge: 2010-10-22 | Disposition: A | Discharge status: Home / Self Care | Payer: Medicare Other | Attending: Emergency Medicine | Admitting: Emergency Medicine

## 2010-10-22 DIAGNOSIS — K5900 Constipation, unspecified: Secondary | ICD-10-CM | POA: Insufficient documentation

## 2010-10-22 DIAGNOSIS — Z862 Personal history of diseases of the blood and blood-forming organs and certain disorders involving the immune mechanism: Secondary | ICD-10-CM | POA: Insufficient documentation

## 2010-10-22 DIAGNOSIS — I10 Essential (primary) hypertension: Secondary | ICD-10-CM | POA: Insufficient documentation

## 2010-10-22 DIAGNOSIS — E7800 Pure hypercholesterolemia, unspecified: Secondary | ICD-10-CM | POA: Insufficient documentation

## 2010-10-22 DIAGNOSIS — Z8639 Personal history of other endocrine, nutritional and metabolic disease: Secondary | ICD-10-CM | POA: Insufficient documentation

## 2010-10-22 DIAGNOSIS — Z79899 Other long term (current) drug therapy: Secondary | ICD-10-CM | POA: Insufficient documentation

## 2010-10-22 DIAGNOSIS — E119 Type 2 diabetes mellitus without complications: Secondary | ICD-10-CM | POA: Insufficient documentation

## 2010-10-22 DIAGNOSIS — R109 Unspecified abdominal pain: Secondary | ICD-10-CM | POA: Insufficient documentation

## 2010-10-22 LAB — COMPREHENSIVE METABOLIC PANEL
ALT: 16 U/L (ref 0–35)
AST: 22 U/L (ref 0–37)
Calcium: 9.2 mg/dL (ref 8.4–10.5)
Sodium: 144 mEq/L (ref 135–145)
Total Protein: 7 g/dL (ref 6.0–8.3)

## 2010-10-22 LAB — CBC
MCH: 29.9 pg (ref 26.0–34.0)
MCHC: 33.7 g/dL (ref 30.0–36.0)
Platelets: 206 10*3/uL (ref 150–400)
RBC: 4.18 MIL/uL (ref 3.87–5.11)
RDW: 15.4 % (ref 11.5–15.5)

## 2010-10-22 LAB — DIFFERENTIAL
Basophils Relative: 0 % (ref 0–1)
Eosinophils Absolute: 0.2 10*3/uL (ref 0.0–0.7)
Monocytes Absolute: 0.4 10*3/uL (ref 0.1–1.0)
Monocytes Relative: 6 % (ref 3–12)
Neutrophils Relative %: 45 % (ref 43–77)

## 2010-11-28 LAB — CBC
HCT: 39.5
Hemoglobin: 13.2
MCHC: 33.5
MCV: 91.2
Platelets: 225
RBC: 4.33
RDW: 14.9
WBC: 10.4

## 2010-11-28 LAB — COMPREHENSIVE METABOLIC PANEL
Alkaline Phosphatase: 64
BUN: 16
CO2: 29
Calcium: 9.2
GFR calc non Af Amer: 46 — ABNORMAL LOW
Glucose, Bld: 194 — ABNORMAL HIGH
Potassium: 3.6
Total Protein: 6.6

## 2010-11-28 LAB — DIFFERENTIAL
Basophils Absolute: 0
Basophils Relative: 0
Eosinophils Absolute: 0.2
Eosinophils Relative: 2
Lymphocytes Relative: 14
Lymphs Abs: 1.4
Monocytes Absolute: 0.3
Monocytes Relative: 3
Neutro Abs: 8.5 — ABNORMAL HIGH
Neutrophils Relative %: 82 — ABNORMAL HIGH

## 2010-11-28 LAB — COMPREHENSIVE METABOLIC PANEL WITH GFR
ALT: 20
AST: 28
Albumin: 3.4 — ABNORMAL LOW
Chloride: 104
Creatinine, Ser: 1.11
GFR calc Af Amer: 56 — ABNORMAL LOW
Sodium: 142
Total Bilirubin: 0.6

## 2010-11-28 LAB — URINALYSIS, ROUTINE W REFLEX MICROSCOPIC
Bilirubin Urine: NEGATIVE
Glucose, UA: 100 — AB
Hgb urine dipstick: NEGATIVE
Ketones, ur: NEGATIVE
Nitrite: NEGATIVE
Protein, ur: NEGATIVE
Specific Gravity, Urine: 1.013
Urobilinogen, UA: 0.2
pH: 7

## 2010-11-28 LAB — LIPASE, BLOOD: Lipase: 29

## 2010-12-07 LAB — CBC
Platelets: 215 10*3/uL (ref 150–400)
RDW: 15.6 % — ABNORMAL HIGH (ref 11.5–15.5)
WBC: 6.9 10*3/uL (ref 4.0–10.5)

## 2010-12-07 LAB — POCT I-STAT, CHEM 8
BUN: 31 mg/dL — ABNORMAL HIGH (ref 6–23)
Creatinine, Ser: 1.6 mg/dL — ABNORMAL HIGH (ref 0.4–1.2)
Hemoglobin: 13.9 g/dL (ref 12.0–15.0)
Potassium: 3.6 mEq/L (ref 3.5–5.1)
Sodium: 144 mEq/L (ref 135–145)
TCO2: 34 mmol/L (ref 0–100)

## 2010-12-07 LAB — URINALYSIS, ROUTINE W REFLEX MICROSCOPIC
Ketones, ur: NEGATIVE mg/dL
Protein, ur: NEGATIVE mg/dL
Urobilinogen, UA: 0.2 mg/dL (ref 0.0–1.0)

## 2010-12-07 LAB — DIFFERENTIAL
Basophils Absolute: 0 10*3/uL (ref 0.0–0.1)
Eosinophils Absolute: 0.2 10*3/uL (ref 0.0–0.7)
Lymphocytes Relative: 36 % (ref 12–46)
Lymphs Abs: 2.5 10*3/uL (ref 0.7–4.0)
Neutrophils Relative %: 55 % (ref 43–77)

## 2010-12-20 LAB — CROSSMATCH

## 2010-12-20 LAB — COMPREHENSIVE METABOLIC PANEL
ALT: 19
AST: 22
Alkaline Phosphatase: 34 — ABNORMAL LOW
Alkaline Phosphatase: 45
BUN: 20
CO2: 34 — ABNORMAL HIGH
Calcium: 8.9
Chloride: 106
GFR calc Af Amer: 44 — ABNORMAL LOW
GFR calc non Af Amer: 37 — ABNORMAL LOW
Glucose, Bld: 121 — ABNORMAL HIGH
Glucose, Bld: 140 — ABNORMAL HIGH
Potassium: 3.3 — ABNORMAL LOW
Sodium: 146 — ABNORMAL HIGH
Total Bilirubin: 0.6
Total Protein: 5.2 — ABNORMAL LOW

## 2010-12-20 LAB — OCCULT BLOOD X 1 CARD TO LAB, STOOL: Fecal Occult Bld: POSITIVE

## 2010-12-20 LAB — CBC
HCT: 26.8 — ABNORMAL LOW
Hemoglobin: 10.9 — ABNORMAL LOW
Hemoglobin: 8.8 — ABNORMAL LOW
MCHC: 32.7
RBC: 2.93 — ABNORMAL LOW
RBC: 3.67 — ABNORMAL LOW
WBC: 7.1
WBC: 8.4

## 2010-12-20 LAB — HEMOGLOBIN AND HEMATOCRIT, BLOOD
HCT: 25.5 — ABNORMAL LOW
HCT: 25.6 — ABNORMAL LOW
HCT: 26.1 — ABNORMAL LOW
Hemoglobin: 10.7 — ABNORMAL LOW
Hemoglobin: 8.4 — ABNORMAL LOW
Hemoglobin: 8.6 — ABNORMAL LOW
Hemoglobin: 8.6 — ABNORMAL LOW
Hemoglobin: 8.8 — ABNORMAL LOW

## 2010-12-20 LAB — DIFFERENTIAL
Basophils Absolute: 0
Basophils Relative: 0
Eosinophils Absolute: 0.1
Eosinophils Relative: 2
Neutrophils Relative %: 64

## 2010-12-20 LAB — PROTIME-INR
INR: 1
Prothrombin Time: 13.4

## 2010-12-20 LAB — BASIC METABOLIC PANEL
CO2: 30
Chloride: 112
GFR calc Af Amer: >60
Sodium: 144

## 2010-12-20 LAB — SAMPLE TO BLOOD BANK

## 2010-12-20 LAB — URINE MICROSCOPIC-ADD ON

## 2010-12-20 LAB — ABO/RH: ABO/RH(D): O POS

## 2010-12-20 LAB — URINALYSIS, ROUTINE W REFLEX MICROSCOPIC
Glucose, UA: 250 — AB
Leukocytes, UA: NEGATIVE
Protein, ur: NEGATIVE
Specific Gravity, Urine: 1.022
Urobilinogen, UA: 0.2

## 2010-12-20 LAB — LIPID PANEL
Cholesterol: 145
Triglycerides: 135

## 2010-12-20 LAB — FERRITIN: Ferritin: 36 (ref 10–291)

## 2010-12-20 LAB — IRON: Iron: <10 — ABNORMAL LOW

## 2010-12-20 LAB — HEMOGLOBIN A1C: Hgb A1c MFr Bld: 7 — ABNORMAL HIGH

## 2010-12-22 ENCOUNTER — Emergency Department (HOSPITAL_COMMUNITY)
Admission: EM | Admit: 2010-12-22 | Discharge: 2010-12-22 | Disposition: A | Discharge status: Home / Self Care | Payer: Medicare Other | Attending: Emergency Medicine | Admitting: Emergency Medicine

## 2010-12-22 ENCOUNTER — Emergency Department (HOSPITAL_COMMUNITY): Payer: Medicare Other

## 2010-12-22 DIAGNOSIS — I498 Other specified cardiac arrhythmias: Secondary | ICD-10-CM | POA: Insufficient documentation

## 2010-12-22 DIAGNOSIS — I4460 Unspecified fascicular block: Secondary | ICD-10-CM | POA: Insufficient documentation

## 2010-12-22 DIAGNOSIS — E7800 Pure hypercholesterolemia, unspecified: Secondary | ICD-10-CM | POA: Insufficient documentation

## 2010-12-22 DIAGNOSIS — R42 Dizziness and giddiness: Secondary | ICD-10-CM | POA: Insufficient documentation

## 2010-12-22 DIAGNOSIS — R110 Nausea: Secondary | ICD-10-CM | POA: Insufficient documentation

## 2010-12-22 DIAGNOSIS — G319 Degenerative disease of nervous system, unspecified: Secondary | ICD-10-CM | POA: Insufficient documentation

## 2010-12-22 DIAGNOSIS — R269 Unspecified abnormalities of gait and mobility: Secondary | ICD-10-CM | POA: Insufficient documentation

## 2010-12-22 DIAGNOSIS — I440 Atrioventricular block, first degree: Secondary | ICD-10-CM | POA: Insufficient documentation

## 2010-12-22 DIAGNOSIS — I10 Essential (primary) hypertension: Secondary | ICD-10-CM | POA: Insufficient documentation

## 2010-12-22 DIAGNOSIS — E119 Type 2 diabetes mellitus without complications: Secondary | ICD-10-CM | POA: Insufficient documentation

## 2010-12-22 LAB — CBC
HCT: 39.9 % (ref 36.0–46.0)
MCV: 88.5 fL (ref 78.0–100.0)
Platelets: 236 10*3/uL (ref 150–400)
RBC: 4.51 MIL/uL (ref 3.87–5.11)
WBC: 6.9 10*3/uL (ref 4.0–10.5)

## 2010-12-22 LAB — BASIC METABOLIC PANEL
CO2: 25 mEq/L (ref 19–32)
Chloride: 104 mEq/L (ref 96–112)
Creatinine, Ser: 1.16 mg/dL — ABNORMAL HIGH (ref 0.50–1.10)
Potassium: 4.1 mEq/L (ref 3.5–5.1)

## 2010-12-22 LAB — URINALYSIS, ROUTINE W REFLEX MICROSCOPIC
Bilirubin Urine: NEGATIVE
Hgb urine dipstick: NEGATIVE
Specific Gravity, Urine: 1.016 (ref 1.005–1.030)
pH: 6 (ref 5.0–8.0)

## 2010-12-22 LAB — DIFFERENTIAL
Eosinophils Absolute: 0.1 10*3/uL (ref 0.0–0.7)
Lymphocytes Relative: 38 % (ref 12–46)
Lymphs Abs: 2.6 10*3/uL (ref 0.7–4.0)
Neutrophils Relative %: 55 % (ref 43–77)

## 2010-12-22 LAB — POCT I-STAT TROPONIN I

## 2011-09-20 ENCOUNTER — Encounter (HOSPITAL_COMMUNITY): Payer: Self-pay | Admitting: *Deleted

## 2011-09-20 ENCOUNTER — Emergency Department (HOSPITAL_COMMUNITY)
Admission: EM | Admit: 2011-09-20 | Discharge: 2011-09-20 | Disposition: A | Discharge status: Home / Self Care | Payer: Medicare Other | Attending: Emergency Medicine | Admitting: Emergency Medicine

## 2011-09-20 ENCOUNTER — Emergency Department (HOSPITAL_COMMUNITY): Payer: Medicare Other

## 2011-09-20 DIAGNOSIS — E119 Type 2 diabetes mellitus without complications: Secondary | ICD-10-CM | POA: Insufficient documentation

## 2011-09-20 DIAGNOSIS — R1032 Left lower quadrant pain: Secondary | ICD-10-CM | POA: Insufficient documentation

## 2011-09-20 DIAGNOSIS — K769 Liver disease, unspecified: Secondary | ICD-10-CM

## 2011-09-20 DIAGNOSIS — R109 Unspecified abdominal pain: Secondary | ICD-10-CM

## 2011-09-20 DIAGNOSIS — IMO0001 Reserved for inherently not codable concepts without codable children: Secondary | ICD-10-CM

## 2011-09-20 DIAGNOSIS — R110 Nausea: Secondary | ICD-10-CM | POA: Insufficient documentation

## 2011-09-20 DIAGNOSIS — I712 Thoracic aortic aneurysm, without rupture, unspecified: Secondary | ICD-10-CM | POA: Insufficient documentation

## 2011-09-20 DIAGNOSIS — K7689 Other specified diseases of liver: Secondary | ICD-10-CM | POA: Insufficient documentation

## 2011-09-20 DIAGNOSIS — J90 Pleural effusion, not elsewhere classified: Secondary | ICD-10-CM

## 2011-09-20 DIAGNOSIS — E7800 Pure hypercholesterolemia, unspecified: Secondary | ICD-10-CM | POA: Insufficient documentation

## 2011-09-20 DIAGNOSIS — I10 Essential (primary) hypertension: Secondary | ICD-10-CM | POA: Insufficient documentation

## 2011-09-20 DIAGNOSIS — M109 Gout, unspecified: Secondary | ICD-10-CM | POA: Insufficient documentation

## 2011-09-20 HISTORY — DX: Urinary tract infection, site not specified: N39.0

## 2011-09-20 HISTORY — DX: Gout, unspecified: M10.9

## 2011-09-20 HISTORY — DX: Pure hypercholesterolemia, unspecified: E78.00

## 2011-09-20 HISTORY — DX: Essential (primary) hypertension: I10

## 2011-09-20 LAB — CBC
HCT: 36.2 % (ref 36.0–46.0)
Hemoglobin: 12.1 g/dL (ref 12.0–15.0)
MCV: 88.7 fL (ref 78.0–100.0)
RDW: 14.8 % (ref 11.5–15.5)
WBC: 5.7 10*3/uL (ref 4.0–10.5)

## 2011-09-20 LAB — COMPREHENSIVE METABOLIC PANEL
BUN: 20 mg/dL (ref 6–23)
CO2: 33 mEq/L — ABNORMAL HIGH (ref 19–32)
Calcium: 9.2 mg/dL (ref 8.4–10.5)
Chloride: 104 mEq/L (ref 96–112)
Creatinine, Ser: 1.12 mg/dL — ABNORMAL HIGH (ref 0.50–1.10)
GFR calc Af Amer: 48 mL/min — ABNORMAL LOW (ref >90)
GFR calc non Af Amer: 42 mL/min — ABNORMAL LOW (ref >90)
Glucose, Bld: 178 mg/dL — ABNORMAL HIGH (ref 70–99)
Total Bilirubin: 0.2 mg/dL — ABNORMAL LOW (ref 0.3–1.2)

## 2011-09-20 LAB — DIFFERENTIAL
Basophils Absolute: 0 10*3/uL (ref 0.0–0.1)
Eosinophils Relative: 2 % (ref 0–5)
Lymphocytes Relative: 35 % (ref 12–46)
Lymphs Abs: 2 10*3/uL (ref 0.7–4.0)
Monocytes Absolute: 0.3 10*3/uL (ref 0.1–1.0)
Monocytes Relative: 6 % (ref 3–12)

## 2011-09-20 LAB — URINALYSIS, ROUTINE W REFLEX MICROSCOPIC
Ketones, ur: NEGATIVE mg/dL
Protein, ur: NEGATIVE mg/dL
Urobilinogen, UA: 0.2 mg/dL (ref 0.0–1.0)

## 2011-09-20 LAB — URINE MICROSCOPIC-ADD ON

## 2011-09-20 MED ORDER — SODIUM CHLORIDE 0.9 % IV SOLN
1000.0000 mL | Freq: Once | INTRAVENOUS | Status: AC
  Administered 2011-09-20: 1000 mL via INTRAVENOUS

## 2011-09-20 MED ORDER — FAMOTIDINE 20 MG PO TABS: 20.0000 mg | ORAL_TABLET | Freq: Two times a day (BID) | ORAL | Status: DC

## 2011-09-20 MED ORDER — IOHEXOL 300 MG/ML  SOLN
20.0000 mL | INTRAMUSCULAR | Status: AC
  Administered 2011-09-20: 20 mL via ORAL

## 2011-09-20 MED ORDER — ONDANSETRON HCL 4 MG/2ML IJ SOLN
INTRAMUSCULAR | Status: AC
  Filled 2011-09-20: qty 2

## 2011-09-20 MED ORDER — IOHEXOL 300 MG/ML  SOLN
60.0000 mL | Freq: Once | INTRAMUSCULAR | Status: AC | PRN
  Administered 2011-09-20: 60 mL via INTRAVENOUS

## 2011-09-20 MED ORDER — SODIUM CHLORIDE 0.9 % IV SOLN
1000.0000 mL | INTRAVENOUS | Status: DC
  Administered 2011-09-20: 1000 mL via INTRAVENOUS

## 2011-09-20 MED ORDER — ONDANSETRON HCL 4 MG/2ML IJ SOLN
4.0000 mg | Freq: Once | INTRAMUSCULAR | Status: AC
  Administered 2011-09-20: 4 mg via INTRAVENOUS

## 2011-09-20 NOTE — ED Provider Notes (Signed)
11:05 AM Patient has been moved to CDU holding for labs and abdominal CT.  Sign out received from Dr Roselyn Bering.  Patient with 1 month of abdominal pain, seen by PCP and dx with UTI, presents to ED for lack of improvement.  Pt currently in CT.    11:37 AM Patient denies any current abdominal pain.  No needs at this time.  On exam, pt is A&Ox4, NAD, RRR, CTAB, abdomen is soft, NT, no guarding, no rebound.  Labs do not reveal cause for patient's symptoms.  Awaiting CT results.    12:59 PM Discussed results with Dr Lynelle Doctor and with patient and patient's daughter.  Plan is for d/c home with GI and PCP follow up.  Pt has seen Dr Elnoria Howard in the past. Pt given presciption for pepcid, return precautions.  Patient verbalizes understanding and agrees with plan.     Results for orders placed during the hospital encounter of 09/20/11  CBC      Component Value Range   WBC 5.7  4.0 - 10.5 K/uL   RBC 4.08  3.87 - 5.11 MIL/uL   Hemoglobin 12.1  12.0 - 15.0 g/dL   HCT 46.9  62.9 - 52.8 %   MCV 88.7  78.0 - 100.0 fL   MCH 29.7  26.0 - 34.0 pg   MCHC 33.4  30.0 - 36.0 g/dL   RDW 41.3  24.4 - 01.0 %   Platelets 202  150 - 400 K/uL  DIFFERENTIAL      Component Value Range   Neutrophils Relative 58  43 - 77 %   Neutro Abs 3.3  1.7 - 7.7 K/uL   Lymphocytes Relative 35  12 - 46 %   Lymphs Abs 2.0  0.7 - 4.0 K/uL   Monocytes Relative 6  3 - 12 %   Monocytes Absolute 0.3  0.1 - 1.0 K/uL   Eosinophils Relative 2  0 - 5 %   Eosinophils Absolute 0.1  0.0 - 0.7 K/uL   Basophils Relative 0  0 - 1 %   Basophils Absolute 0.0  0.0 - 0.1 K/uL  COMPREHENSIVE METABOLIC PANEL      Component Value Range   Sodium 147 (*) 135 - 145 mEq/L   Potassium 3.4 (*) 3.5 - 5.1 mEq/L   Chloride 104  96 - 112 mEq/L   CO2 33 (*) 19 - 32 mEq/L   Glucose, Bld 178 (*) 70 - 99 mg/dL   BUN 20  6 - 23 mg/dL   Creatinine, Ser 2.72 (*) 0.50 - 1.10 mg/dL   Calcium 9.2  8.4 - 53.6 mg/dL   Total Protein 6.4  6.0 - 8.3 g/dL   Albumin 3.2 (*) 3.5  - 5.2 g/dL   AST 20  0 - 37 U/L   ALT 14  0 - 35 U/L   Alkaline Phosphatase 66  39 - 117 U/L   Total Bilirubin 0.2 (*) 0.3 - 1.2 mg/dL   GFR calc non Af Amer 42 (*) >90 mL/min   GFR calc Af Amer 48 (*) >90 mL/min  LIPASE, BLOOD      Component Value Range   Lipase 50  11 - 59 U/L  URINALYSIS, ROUTINE W REFLEX MICROSCOPIC      Component Value Range   Color, Urine YELLOW  YELLOW   APPearance CLOUDY (*) CLEAR   Specific Gravity, Urine 1.010  1.005 - 1.030   pH 8.5 (*) 5.0 - 8.0   Glucose, UA NEGATIVE  NEGATIVE  mg/dL   Hgb urine dipstick NEGATIVE  NEGATIVE   Bilirubin Urine NEGATIVE  NEGATIVE   Ketones, ur NEGATIVE  NEGATIVE mg/dL   Protein, ur NEGATIVE  NEGATIVE mg/dL   Urobilinogen, UA 0.2  0.0 - 1.0 mg/dL   Nitrite NEGATIVE  NEGATIVE   Leukocytes, UA TRACE (*) NEGATIVE  URINE MICROSCOPIC-ADD ON      Component Value Range   Squamous Epithelial / LPF RARE  RARE   WBC, UA 0-2  <3 WBC/hpf   RBC / HPF 0-2  <3 RBC/hpf   Bacteria, UA RARE  RARE   Urine-Other AMORPHOUS URATES/PHOSPHATES     Ct Abdomen Pelvis W Contrast  09/20/2011  *RADIOLOGY REPORT*  Clinical Data: Left mid abdominal and left lower quadrant pain. Nausea.  CT ABDOMEN AND PELVIS WITH CONTRAST  Technique:  Multidetector CT imaging of the abdomen and pelvis was performed following the standard protocol during bolus administration of intravenous contrast.  Contrast: 60mL OMNIPAQUE IOHEXOL 300 MG/ML  SOLN  Comparison: 06/10/2007.  Findings: Lung bases show scattered scarring and volume loss. Heart is enlarged.  Descending thoracic aorta measures 3.4 cm.  No pericardial effusion.  Trace right pleural fluid.  A 5 mm low attenuation lesion in the inferior aspect of the left hepatic lobe is too small to characterize.  The liver margin may be slightly irregular and the left hepatic lobe appears somewhat hypertrophied.  Small perihepatic fluid collection, unchanged 06/10/2007.  Gallbladder, adrenal glands, kidneys, spleen, pancreas and  stomach are unremarkable.  The pylorus appears thickened (image 40).  Small bowel is unremarkable.  Extensive colonic diverticulosis.  Calcifications are seen in the uterus.  Ovaries are visualized.  No pathologically enlarged lymph nodes.  No free fluid. Atherosclerotic calcification of the arterial vasculature without abdominal aortic aneurysm.  No worrisome lytic or sclerotic lesions.  Advanced degenerative disc disease at L3-4.  IMPRESSION:  1.  Possible wall thickening involving the pylorus.  Difficult to exclude a mass.  Upper endoscopy may be helpful in further evaluation, as clinically indicated. 2.  Suspect cirrhosis.  Correlation with liver function tests would likely be helpful, as clinically indicated. 3.  Trace right pleural effusion. 4.  Mildly aneurysmal descending thoracic aorta. 5.  Small perihepatic fluid collection is unchanged from 06/10/2007.  Original Report Authenticated By: Reyes Ivan, M.D.      [facility]) [place], [place] 09/20/11 [phone]

## 2011-09-20 NOTE — ED Provider Notes (Signed)
History     CSN: [id number]  Arrival date & time 09/20/11  4098   First MD Initiated Contact with Patient 09/20/11 0920      Chief Complaint  Patient presents with  . Abdominal Pain    (Consider location/radiation/quality/duration/timing/severity/associated sxs/prior treatment) HPI Comments: Pt was seen one month ago and treated for a UTI.  The symptoms have not gotten any better.  She was still having pain and unable to see her doctor today so she came to the ED.  Patient is a 78 y.o. female presenting with abdominal pain. The history is provided by the patient.  Abdominal Pain The primary symptoms of the illness include abdominal pain. The primary symptoms of the illness do not include fever, nausea, vomiting, diarrhea or dysuria. The current episode started more than 2 days ago (1 month ago). The onset of the illness was gradual. Progression since onset: waxing and waning.  Onset quality: it feel like gas and comes and goes. The abdominal pain is located in the LLQ. The abdominal pain does not radiate. The abdominal pain is relieved by nothing. Exacerbated by: not worse with eating or urinating.  Symptoms associated with the illness do not include urgency or hematuria.    Past Medical History  Diagnosis Date  . UTI (urinary tract infection)   . Hypertension   . Diabetes mellitus   . Gout   . High cholesterol     History reviewed. No pertinent past surgical history.  History reviewed. No pertinent family history.  History  Substance Use Topics  . Smoking status: Not on file  . Smokeless tobacco: Not on file  . Alcohol Use: No    OB History    Grav Para Term Preterm Abortions TAB SAB Ect Mult Living                  Review of Systems  Constitutional: Negative for fever.  Respiratory: Negative for chest tightness.   Cardiovascular: Negative for chest pain.  Gastrointestinal: Positive for abdominal pain. Negative for nausea, vomiting and diarrhea.  Genitourinary:  Negative for dysuria, urgency and hematuria.  Neurological: Negative for weakness.  All other systems reviewed and are negative.    Allergies  Review of patient's allergies indicates no known allergies.  Home Medications  No current outpatient prescriptions on file.  BP 156/87  Pulse 63  Temp 97.6 F (36.4 C) (Oral)  Resp 22  SpO2 97%  Physical Exam  Nursing note and vitals reviewed. Constitutional: She appears well-developed and well-nourished. No distress.  HENT:  Head: Normocephalic and atraumatic.  Right Ear: External ear normal.  Left Ear: External ear normal.  Eyes: Conjunctivae are normal. Right eye exhibits no discharge. Left eye exhibits no discharge. No scleral icterus.  Neck: Neck supple. No tracheal deviation present.  Cardiovascular: Normal rate, regular rhythm and intact distal pulses.   Pulmonary/Chest: Effort normal and breath sounds normal. No stridor. No respiratory distress. She has no wheezes. She has no rales.  Abdominal: Soft. Bowel sounds are normal. She exhibits no distension. There is no tenderness. There is no rebound and no guarding.  Musculoskeletal: She exhibits no edema and no tenderness.  Neurological: She is alert. She has normal strength. No sensory deficit. Cranial nerve deficit:  no gross defecits noted. She exhibits normal muscle tone. She displays no seizure activity. Coordination normal.  Skin: Skin is warm and dry. No rash noted.  Psychiatric: She has a normal mood and affect.    ED Course  Procedures (  including critical care time)  Labs Reviewed  COMPREHENSIVE METABOLIC PANEL - Abnormal; Notable for the following:    Sodium 147 (*)     Potassium 3.4 (*)     CO2 33 (*)     Glucose, Bld 178 (*)     Creatinine, Ser 1.12 (*)     Albumin 3.2 (*)     Total Bilirubin 0.2 (*)     GFR calc non Af Amer 42 (*)     GFR calc Af Amer 48 (*)     All other components within normal limits  URINALYSIS, ROUTINE W REFLEX MICROSCOPIC - Abnormal;  Notable for the following:    APPearance CLOUDY (*)     pH 8.5 (*)     Leukocytes, UA TRACE (*)     All other components within normal limits  CBC  DIFFERENTIAL  LIPASE, BLOOD  URINE MICROSCOPIC-ADD ON   Ct Abdomen Pelvis W Contrast  09/20/2011  *RADIOLOGY REPORT*  Clinical Data: Left mid abdominal and left lower quadrant pain. Nausea.  CT ABDOMEN AND PELVIS WITH CONTRAST  Technique:  Multidetector CT imaging of the abdomen and pelvis was performed following the standard protocol during bolus administration of intravenous contrast.  Contrast: 60mL OMNIPAQUE IOHEXOL 300 MG/ML  SOLN  Comparison: 06/10/2007.  Findings: Lung bases show scattered scarring and volume loss. Heart is enlarged.  Descending thoracic aorta measures 3.4 cm.  No pericardial effusion.  Trace right pleural fluid.  A 5 mm low attenuation lesion in the inferior aspect of the left hepatic lobe is too small to characterize.  The liver margin may be slightly irregular and the left hepatic lobe appears somewhat hypertrophied.  Small perihepatic fluid collection, unchanged 06/10/2007.  Gallbladder, adrenal glands, kidneys, spleen, pancreas and stomach are unremarkable.  The pylorus appears thickened (image 40).  Small bowel is unremarkable.  Extensive colonic diverticulosis.  Calcifications are seen in the uterus.  Ovaries are visualized.  No pathologically enlarged lymph nodes.  No free fluid. Atherosclerotic calcification of the arterial vasculature without abdominal aortic aneurysm.  No worrisome lytic or sclerotic lesions.  Advanced degenerative disc disease at L3-4.  IMPRESSION:  1.  Possible wall thickening involving the pylorus.  Difficult to exclude a mass.  Upper endoscopy may be helpful in further evaluation, as clinically indicated. 2.  Suspect cirrhosis.  Correlation with liver function tests would likely be helpful, as clinically indicated. 3.  Trace right pleural effusion. 4.  Mildly aneurysmal descending thoracic aorta. 5.   Small perihepatic fluid collection is unchanged from 06/10/2007.  Original Report Authenticated By: Reyes Ivan, M.D.     MDM  Pt denies any pain now however has been having pain for several weeks in llq.  Will check labs and urine as well as CT abdomen to assess for other etiology, diverticulitis, aneurysm.  12:02 PM Findings reviewed.  No signs of acute abnormality on CT scan that requires emergent /urgent intervention.  Descending thoracic aorta noted by incidental.  Will have pt follow up with PCP consider vascular surgeon consult.  No uti.  No diverticulitis.  Would recommend GI follow up.  Start antacids.        Celene Kras, MD 09/20/11 [phone]

## 2011-09-20 NOTE — ED Notes (Signed)
PT CONTINUES TO WALK BACK AND FORTH TO RESTROOM TO VOID. PT TO RESTROOM Q 20-30 MIN TO VOID

## 2011-09-20 NOTE — ED Notes (Signed)
Family at bedside. 

## 2011-09-20 NOTE — ED Notes (Signed)
PT HAS FINISHED PO CT PREP. TOLERATED WELL CT IS AWARE.

## 2011-09-20 NOTE — ED Notes (Signed)
Pt reports seeing pcp approx 3 weeks ago for abd pain, dx with uti, finished antibiotic tx and still having LLQ pain and nausea. Had small bowel movement today.

## 2011-09-20 NOTE — ED Notes (Signed)
PT AMBULATORY TO RESTROOM.

## 2011-09-21 NOTE — ED Provider Notes (Signed)
Medical screening examination/treatment/procedure(s) were conducted as a shared visit with non-physician practitioner(s) and myself.  I personally evaluated the patient during the encounter  Please see my note from same visit  Celene Kras, MD 09/21/11 [phone]

## 2011-10-15 ENCOUNTER — Other Ambulatory Visit: Payer: Self-pay

## 2011-10-15 ENCOUNTER — Encounter (HOSPITAL_COMMUNITY): Payer: Self-pay | Admitting: *Deleted

## 2011-10-15 ENCOUNTER — Emergency Department (HOSPITAL_COMMUNITY)
Admission: EM | Admit: 2011-10-15 | Discharge: 2011-10-15 | Disposition: A | Discharge status: Home / Self Care | Payer: Medicare Other | Attending: Emergency Medicine | Admitting: Emergency Medicine

## 2011-10-15 DIAGNOSIS — E7800 Pure hypercholesterolemia, unspecified: Secondary | ICD-10-CM | POA: Insufficient documentation

## 2011-10-15 DIAGNOSIS — E119 Type 2 diabetes mellitus without complications: Secondary | ICD-10-CM | POA: Insufficient documentation

## 2011-10-15 DIAGNOSIS — M109 Gout, unspecified: Secondary | ICD-10-CM | POA: Insufficient documentation

## 2011-10-15 DIAGNOSIS — I10 Essential (primary) hypertension: Secondary | ICD-10-CM | POA: Insufficient documentation

## 2011-10-15 DIAGNOSIS — R002 Palpitations: Secondary | ICD-10-CM | POA: Insufficient documentation

## 2011-10-15 LAB — BASIC METABOLIC PANEL
BUN: 27 mg/dL — ABNORMAL HIGH (ref 6–23)
CO2: 32 mEq/L (ref 19–32)
Calcium: 9.8 mg/dL (ref 8.4–10.5)
Creatinine, Ser: 1.01 mg/dL (ref 0.50–1.10)
Glucose, Bld: 206 mg/dL — ABNORMAL HIGH (ref 70–99)

## 2011-10-15 LAB — CBC WITH DIFFERENTIAL/PLATELET
Lymphs Abs: 2.2 10*3/uL (ref 0.7–4.0)
MCH: 29.4 pg (ref 26.0–34.0)
MCHC: 33.2 g/dL (ref 30.0–36.0)
MCV: 88.8 fL (ref 78.0–100.0)
Monocytes Absolute: 0.4 10*3/uL (ref 0.1–1.0)
Monocytes Relative: 6 % (ref 3–12)
Platelets: 211 10*3/uL (ref 150–400)
RDW: 14.6 % (ref 11.5–15.5)
WBC: 6.4 10*3/uL (ref 4.0–10.5)

## 2011-10-15 LAB — POCT I-STAT TROPONIN I: Troponin i, poc: 0 ng/mL (ref 0.00–0.08)

## 2011-10-15 NOTE — ED Provider Notes (Signed)
History     CSN: [id number]  Arrival date & time 10/15/11  6213   None     Chief Complaint  Patient presents with  . Irregular Heart Beat    (Consider location/radiation/quality/duration/timing/severity/associated sxs/prior treatment) HPI  This is a 78 year old woman, who presents with a pounding heartbeat this morning for an about hour. The patient has a past medical history of diabetes, gout, and a remote history of GI bleeding. She reports that she was awakened this morning with her heart beating fast and pounding without any chest pain, or difficulty in breathing. She denies history of dizziness or passing out associated with the pounding heartbeat. She is not sure whether she has ever felt like this before. She doesn't have fever or chills. She does not have cough. The patient is very concerned about her recent loss of taste, which has reduced her food intake over the last 2-3 weeks. Significantly, the patient has a history of passing black stools for a nonspecific time. She has no prior history of heart disease, no history of atrial fibrillation   Past Medical History  Diagnosis Date  . UTI (urinary tract infection)   . Hypertension   . Diabetes mellitus   . Gout   . High cholesterol     History reviewed. No pertinent past surgical history.  History reviewed. No pertinent family history.  History  Substance Use Topics  . Smoking status: Not on file  . Smokeless tobacco: Not on file  . Alcohol Use: No    OB History    Grav Para Term Preterm Abortions TAB SAB Ect Mult Living                  Review of Systems  Constitutional: Positive for activity change and appetite change. Negative for fever, chills, diaphoresis, fatigue and unexpected weight change.  HENT: Negative for hearing loss, facial swelling, neck pain, neck stiffness and ear discharge.   Eyes: Negative for visual disturbance.  Respiratory: Negative for wheezing and stridor.   Cardiovascular: Negative  for leg swelling.  Gastrointestinal: Positive for abdominal distention.  Genitourinary: Negative.   Neurological: Negative for tremors, seizures, syncope, speech difficulty, weakness, numbness and headaches.  Psychiatric/Behavioral: Negative.     Allergies  Review of patient's allergies indicates no known allergies.  Home Medications   Current Outpatient Rx  Name Route Sig Dispense Refill  . ALLOPURINOL 100 MG PO TABS Oral Take 100 mg by mouth 2 (two) times daily.    Marland Kitchen AMLODIPINE BESYLATE 10 MG PO TABS Oral Take 10 mg by mouth daily.    . ASPIRIN EC 81 MG PO TBEC Oral Take 81 mg by mouth daily.    . BUMETANIDE 1 MG PO TABS Oral Take 1 mg by mouth daily.    Marland Kitchen EZETIMIBE 10 MG PO TABS Oral Take 10 mg by mouth daily.    Marland Kitchen GABAPENTIN 300 MG PO CAPS Oral Take 300 mg by mouth daily.     . GLYBURIDE-METFORMIN 1.25-250 MG PO TABS Oral Take 1 tablet by mouth 2 (two) times daily with a meal.    . SIMVASTATIN 10 MG PO TABS  once.      BP 164/98  Pulse 73  Temp 98.5 F (36.9 C) (Oral)  Resp 16  SpO2 95%  Physical Exam  Constitutional: She is oriented to person, place, and time. She appears well-developed and well-nourished. No distress.  HENT:  Head: Normocephalic and atraumatic.  Eyes: Conjunctivae are normal. Pupils are equal, round, and  reactive to light.  Neck: Normal range of motion. Neck supple.  Cardiovascular: Normal rate, regular rhythm and intact distal pulses.  Exam reveals no gallop and no friction rub.   No murmur heard. Pulmonary/Chest: Effort normal and breath sounds normal. No respiratory distress. She has no wheezes. She has no rales. She exhibits no tenderness.  Abdominal: Normal appearance and bowel sounds are normal. She exhibits distension. She exhibits no abdominal bruit, no pulsatile midline mass and no mass. There is no tenderness. There is no rebound and no guarding. No hernia.  Genitourinary: Rectal exam shows external hemorrhoid. Rectal exam shows no internal  hemorrhoid, no fissure, no tenderness and anal tone normal.  Musculoskeletal: She exhibits no edema and no tenderness.  Neurological: She is alert and oriented to person, place, and time. No cranial nerve deficit. Coordination normal.  Skin: She is not diaphoretic.    ED Course  Procedures (including critical care time)  Labs Reviewed - No data to display No results found.   EKG:  Normal sinus rate rhythm, with a heart rate of about 75 beats per minute PR, QT, QRS intervals are within normal range Normal axis with a left anterior vascular block No ST or T wave changes to suggest ischemia.    MDM  In this patient who is 78 years old and presents with an episode of palpitations without chest pain or, difficulty in breathing, possible cause of this includes paroxysmal atrial fibrillation even though she has no prior history of atrial fibrillation. The EKG done in the ED his normal. On arrival to the ED the patient's symptoms have resolved. Of note, the patient has a history of GI bleeding, and she reports black stools. However, on rectal exam there is no frank bleeding. It is possible that she has chronic upper GI bleeding and a FOBT and CBC has been ordered. The CBC, fecal occult blood, and BMET have all come back normal. The patient will followup with her PCP.      Dow Adolph, MD 10/15/11 1531

## 2011-10-15 NOTE — ED Notes (Signed)
Pt states she was in bed about an hour an a half ago and felt like her heart was beating fast. Pt denies pain. Pt denies SOB. Pt denies N/V. Pt just had a few min or irregular heart rate.

## 2011-10-15 NOTE — ED Provider Notes (Signed)
I have personally seen and examined the patient.  I have discussed the plan of care with the resident.  I have reviewed the documentation on PMH/FH/Soc. History.  I have reviewed the documentation of the resident and agree.  Doug Sou, MD 10/15/11 1659

## 2011-10-15 NOTE — ED Notes (Signed)
Dr K at bedside.

## 2011-10-15 NOTE — ED Provider Notes (Signed)
Complains of irregular heartbeat onset this morning also possibly one hour resolved upon arrival here patient also reports diminished appetite for approximately the past 2 weeks and has had some black stools she is presently asymptomatic without treatment on exam alert awake Glasgow Coma Score 15 lungs clear auscultation heart regular rate and rhythm abdomen nondistended nontender  Doug Sou, MD 10/15/11 [phone]

## 2011-11-16 ENCOUNTER — Emergency Department (HOSPITAL_COMMUNITY): Payer: Medicare Other

## 2011-11-16 ENCOUNTER — Encounter (HOSPITAL_COMMUNITY): Payer: Self-pay

## 2011-11-16 ENCOUNTER — Emergency Department (HOSPITAL_COMMUNITY)
Admission: EM | Admit: 2011-11-16 | Discharge: 2011-11-16 | Disposition: A | Discharge status: Home / Self Care | Payer: Medicare Other | Attending: Emergency Medicine | Admitting: Emergency Medicine

## 2011-11-16 DIAGNOSIS — R109 Unspecified abdominal pain: Secondary | ICD-10-CM | POA: Insufficient documentation

## 2011-11-16 DIAGNOSIS — I10 Essential (primary) hypertension: Secondary | ICD-10-CM | POA: Insufficient documentation

## 2011-11-16 DIAGNOSIS — Z7982 Long term (current) use of aspirin: Secondary | ICD-10-CM | POA: Insufficient documentation

## 2011-11-16 DIAGNOSIS — K5730 Diverticulosis of large intestine without perforation or abscess without bleeding: Secondary | ICD-10-CM | POA: Insufficient documentation

## 2011-11-16 DIAGNOSIS — E119 Type 2 diabetes mellitus without complications: Secondary | ICD-10-CM | POA: Insufficient documentation

## 2011-11-16 DIAGNOSIS — Z79899 Other long term (current) drug therapy: Secondary | ICD-10-CM | POA: Insufficient documentation

## 2011-11-16 DIAGNOSIS — R9431 Abnormal electrocardiogram [ECG] [EKG]: Secondary | ICD-10-CM | POA: Insufficient documentation

## 2011-11-16 DIAGNOSIS — E7800 Pure hypercholesterolemia, unspecified: Secondary | ICD-10-CM | POA: Insufficient documentation

## 2011-11-16 LAB — URINE MICROSCOPIC-ADD ON

## 2011-11-16 LAB — CBC WITH DIFFERENTIAL/PLATELET
Basophils Relative: 0 % (ref 0–1)
Hemoglobin: 13.3 g/dL (ref 12.0–15.0)
Lymphocytes Relative: 39 % (ref 12–46)
Lymphs Abs: 2.6 10*3/uL (ref 0.7–4.0)
MCHC: 33.8 g/dL (ref 30.0–36.0)
Monocytes Relative: 5 % (ref 3–12)
Neutro Abs: 3.6 10*3/uL (ref 1.7–7.7)
Neutrophils Relative %: 54 % (ref 43–77)
RBC: 4.46 MIL/uL (ref 3.87–5.11)
WBC: 6.7 10*3/uL (ref 4.0–10.5)

## 2011-11-16 LAB — GLUCOSE, CAPILLARY

## 2011-11-16 LAB — URINALYSIS, ROUTINE W REFLEX MICROSCOPIC
Glucose, UA: 250 mg/dL — AB
Ketones, ur: NEGATIVE mg/dL
Protein, ur: 30 mg/dL — AB

## 2011-11-16 LAB — COMPREHENSIVE METABOLIC PANEL
Albumin: 3.7 g/dL (ref 3.5–5.2)
Alkaline Phosphatase: 84 U/L (ref 39–117)
BUN: 20 mg/dL (ref 6–23)
CO2: 30 mEq/L (ref 19–32)
Chloride: 103 mEq/L (ref 96–112)
Potassium: 3.4 mEq/L — ABNORMAL LOW (ref 3.5–5.1)
Total Bilirubin: 0.2 mg/dL — ABNORMAL LOW (ref 0.3–1.2)

## 2011-11-16 LAB — POCT I-STAT TROPONIN I

## 2011-11-16 LAB — OCCULT BLOOD, POC DEVICE: Fecal Occult Bld: POSITIVE

## 2011-11-16 MED ORDER — IOHEXOL 300 MG/ML  SOLN
20.0000 mL | INTRAMUSCULAR | Status: AC
  Administered 2011-11-16 (×2): 20 mL via ORAL

## 2011-11-16 MED ORDER — HYDRALAZINE HCL 20 MG/ML IJ SOLN: 10.0000 mg | Freq: Once | INTRAMUSCULAR | Status: DC

## 2011-11-16 MED ORDER — HYDRALAZINE HCL 20 MG/ML IJ SOLN
5.0000 mg | Freq: Once | INTRAMUSCULAR | Status: AC
  Administered 2011-11-16: 5 mg via INTRAVENOUS
  Filled 2011-11-16: qty 1

## 2011-11-16 MED ORDER — SODIUM CHLORIDE 0.9 % IV BOLUS (SEPSIS)
500.0000 mL | Freq: Once | INTRAVENOUS | Status: AC
  Administered 2011-11-16: 500 mL via INTRAVENOUS

## 2011-11-16 MED ORDER — AMLODIPINE BESYLATE 5 MG PO TABS
5.0000 mg | ORAL_TABLET | Freq: Every day | ORAL | Status: DC
  Administered 2011-11-16: 5 mg via ORAL
  Filled 2011-11-16: qty 1

## 2011-11-16 MED ORDER — IOHEXOL 300 MG/ML  SOLN
80.0000 mL | Freq: Once | INTRAMUSCULAR | Status: AC | PRN
  Administered 2011-11-16: 80 mL via INTRAVENOUS

## 2011-11-16 NOTE — ED Notes (Signed)
Delice Bison, CSW, brought pt daughter back with pt permission.

## 2011-11-16 NOTE — ED Notes (Signed)
As I was leaving pt triage room. Pt sts that she is being fed too much medication by her daughters and that she is scared to say too much and get in trouble.

## 2011-11-16 NOTE — ED Notes (Signed)
Pt complains of abd pain, sts started today, vomtiing yesterday and no problems with bowel and bladder, pt sts she cant talk and appears paranoid in traige, pt sts "too much medicine" repeatedly in triage.
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
## 2011-11-16 NOTE — ED Provider Notes (Signed)
History     CSN: [id number]  Arrival date & time 11/16/11  1205   First MD Initiated Contact with Patient 11/16/11 1215      Chief Complaint  Patient presents with  . Abdominal Pain    (Consider location/radiation/quality/duration/timing/severity/associated sxs/prior treatment) The history is provided by the patient and a relative.    78 year old female in no acute distress presenting with abdominal pain onset last night. She reports single episode of vomiting last night. Patient states the abdominal pain is severe at onset, cannot quantitate, but states it is significantly better now. Patient is poor historian, states that she doesn't think she has been constipated. Points to the epigastric region for location. Patient denies fever, chest pain, shortness of breath, headache, change in bowel or bladder habits. Patient states she is not happy about the number of pills that she has to take.  Past Medical History  Diagnosis Date  . UTI (urinary tract infection)   . Hypertension   . Diabetes mellitus   . Gout   . High cholesterol     No past surgical history on file.  No family history on file.  History  Substance Use Topics  . Smoking status: Not on file  . Smokeless tobacco: Not on file  . Alcohol Use: No    OB History    Grav Para Term Preterm Abortions TAB SAB Ect Mult Living                  Review of Systems  Constitutional: Negative for fever.  Respiratory: Negative for shortness of breath.   Cardiovascular: Negative for chest pain.  Gastrointestinal: Positive for vomiting and abdominal pain. Negative for nausea and diarrhea.  All other systems reviewed and are negative.    Allergies  Review of patient's allergies indicates no known allergies.  Home Medications   Current Outpatient Rx  Name Route Sig Dispense Refill  . ALLOPURINOL 100 MG PO TABS Oral Take 100 mg by mouth 2 (two) times daily.    Marland Kitchen AMLODIPINE BESYLATE 10 MG PO TABS Oral Take 10 mg by  mouth daily.    . ASPIRIN EC 81 MG PO TBEC Oral Take 81 mg by mouth daily.    . BUMETANIDE 1 MG PO TABS Oral Take 1 mg by mouth daily.    Marland Kitchen EZETIMIBE 10 MG PO TABS Oral Take 10 mg by mouth daily.    Marland Kitchen GABAPENTIN 300 MG PO CAPS Oral Take 300 mg by mouth daily.     . GLYBURIDE-METFORMIN 1.25-250 MG PO TABS Oral Take 1 tablet by mouth 2 (two) times daily with a meal.    . SIMVASTATIN 10 MG PO TABS  once.      BP 211/112  Pulse 81  Temp 98.5 F (36.9 C) (Oral)  Resp 18  SpO2 96%  Physical Exam  Nursing note and vitals reviewed. Constitutional: She appears well-developed and well-nourished. No distress.  HENT:  Head: Normocephalic and atraumatic.  Mouth/Throat: Oropharynx is clear and moist.  Eyes: Conjunctivae normal and EOM are normal. Pupils are equal, round, and reactive to light.  Neck: No JVD present.  Cardiovascular: Normal rate, regular rhythm, normal heart sounds and intact distal pulses.   Pulmonary/Chest: Effort normal and breath sounds normal. No stridor. No respiratory distress. She has no wheezes. She has no rales. She exhibits no tenderness.  Abdominal: Soft. Bowel sounds are normal. She exhibits no distension and no mass. There is no tenderness. There is no rebound and no guarding.  Genitourinary:       External hemorrhoid, good rectal tone, stool is normal color.  Musculoskeletal: Normal range of motion. She exhibits no edema.  Neurological: She is alert.       Patient is a little confused state she is 48 and said of her actual age of 56  Skin: Skin is warm.  Psychiatric: She has a normal mood and affect.    ED Course  Procedures (including critical care time)  Labs Reviewed  COMPREHENSIVE METABOLIC PANEL - Abnormal; Notable for the following:    Potassium 3.4 (*)     Glucose, Bld 216 (*)     Total Bilirubin 0.2 (*)     GFR calc non Af Amer 45 (*)     GFR calc Af Amer 52 (*)     All other components within normal limits  URINALYSIS, ROUTINE W REFLEX  MICROSCOPIC - Abnormal; Notable for the following:    APPearance CLOUDY (*)     Glucose, UA 250 (*)     Hgb urine dipstick SMALL (*)     Protein, ur 30 (*)     All other components within normal limits  GLUCOSE, CAPILLARY - Abnormal; Notable for the following:    Glucose-Capillary 162 (*)     All other components within normal limits  URINE MICROSCOPIC-ADD ON - Abnormal; Notable for the following:    Squamous Epithelial / LPF FEW (*)     Bacteria, UA FEW (*)     All other components within normal limits  CBC WITH DIFFERENTIAL  LIPASE, BLOOD  POCT I-STAT TROPONIN I  POCT I-STAT TROPONIN I  LACTIC ACID, PLASMA  OCCULT BLOOD X 1 CARD TO LAB, STOOL   Ct Abdomen Pelvis W Contrast  11/16/2011  *RADIOLOGY REPORT*  Clinical Data: Abdominal pain, vomiting  CT ABDOMEN AND PELVIS WITH CONTRAST  Technique:  Multidetector CT imaging of the abdomen and pelvis was performed following the standard protocol during bolus administration of intravenous contrast.  Contrast: 80mL OMNIPAQUE IOHEXOL 300 MG/ML  SOLN  Comparison: CT abdomen pelvis of 09/20/2011  Findings: The lung bases are clear with no change in probable calcified granuloma at the right lung base above hemidiaphragm. Cardiomegaly is stable.  There is atheromatous change throughout the distal thoracic aorta.  The liver enhances with no significant interval change noted.  No ductal dilatation is present.  No calcified gallstones are noted.  The common bile duct and pancreatic duct are slightly prominent but stable, and the pancreas is normal in size with no interval change evident.  The adrenal glands and spleen are unremarkable.  The stomach is not well distended.  The kidneys enhance with no calculus or mass and no hydronephrosis is seen.  On delayed images, the pelvocaliceal systems are unremarkable.  There is fusiform dilatation of the abdominal aorta but no focal aneurysm is seen.  Moderate atheromatous changes noted.  The common iliac arteries are  very tortuous.  The uterus is to the left midline and contains dense calcification consistent with uterine fibroids.  No adnexal lesion is seen.  No free fluid is noted within the pelvis.  The urinary bladder is moderately distended with no abnormality noted.  Multiple rectosigmoid colonic diverticula are present.  The appendix fills with contrast and is unremarkable.  No distention of small bowel loops is seen.  There is degenerative disc disease at the L3-4 level.  IMPRESSION:  1.  No explanation for the patient's pain is seen. 2.  Somewhat prominent pancreatic duct which  appears stable when compared to the prior exam. 3.  Fusiform dilatation of the abdominal aorta with diffuse atheromatous change. 4.  Multiple rectosigmoid colonic diverticula.   Original Report Authenticated By: Juline Patch, M.D.      Date: 11/16/2011  Rate: 72  Rhythm: normal sinus rhythm  QRS Axis: left  Intervals: normal and QT prolonged  ST/T Wave abnormalities: nonspecific ST changes  Conduction Disutrbances:left anterior fascicular block  Narrative Interpretation:   Old EKG Reviewed: unchanged    No diagnosis found.    MDM   78 year old female complaining of abdominal pain and single episode of vomiting last night. Abdominal exam is nonsurgical EKGs unremarkable and troponin is negative. Urine is clean electrolytes are normal lipase and CBC are also normal there was an air her in obtaining the lactic acid and sample will have to be redrawn.  Patient will be transferred to CDU pending lactic acid and guaiac results. Report given to PA [facility]. Plan is to DC with primary care followup unless there is a significant abnormality with the lactic acid.         Wynetta Emery, PA-C 11/16/11 1642

## 2011-11-16 NOTE — ED Provider Notes (Signed)
Medical screening examination/treatment/procedure(s) were conducted as a shared visit with non-physician practitioner(s) and myself.  I personally evaluated the patient during the encounter  Poor historian. Diffuse abdominal pain x 12 hours.  One episode vomiting last night.  Abdomen benign on exam, no guarding or rebound.  Hypertensive and did not take meds today.  Glynn Octave, MD 11/16/11 [phone]

## 2011-11-16 NOTE — ED Notes (Signed)
Hydralazine given iv and food requested for the pt

## 2011-11-16 NOTE — ED Provider Notes (Signed)
5:19 PM Care assumed in the CDU for abdominal pain work up. Patient's lactate and guaiac results are pending. Will reassess after results. Patient also recommended to follow up with PCP regarding prolonged QT shown here in the ED.   7:45 PM Patient's lactate, troponin, and guaiac have resulted. The lactate and troponin are both negative. The patient has a positive guaiac but is hemodynamically stable currently with a hemoglobin of 13.3. She would like to go home. She will have recommended follow up with her PCP. No further evaluation needed at this time.   Filed Vitals:   11/16/11 1921  BP: 173/83  Pulse:   Temp: 98 F (36.7 C)  Resp: [address] PA-C 11/16/11 1947

## 2011-11-16 NOTE — ED Notes (Signed)
Social work called for consult.
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
## 2011-11-16 NOTE — ED Notes (Signed)
[name] with social work at bedside

## 2011-11-16 NOTE — ED Notes (Signed)
Report given to Chris, RN

## 2011-11-16 NOTE — ED Notes (Signed)
Food requested just arrived pt will eat before i discharge her

## 2011-11-17 NOTE — ED Provider Notes (Signed)
Medical screening examination/treatment/procedure(s) were conducted as a shared visit with non-physician practitioner(s) and myself.  I personally evaluated the patient during the encounter   [name] Octave, MD 11/17/11 1042

## 2011-11-19 NOTE — Progress Notes (Signed)
   CARE MANAGEMENT ED NOTE 11/19/2011  Patient:  [name], [name]   Account Number:  [id number]  Date Initiated:  11/19/2011  Documentation initiated by:  Fransico Michael  Subjective/Objective Assessment:   need for home health services.     Subjective/Objective Assessment Detail:     Action/Plan:   Action/Plan Detail:   Anticipated DC Date:  11/16/2011     Status Recommendation to Physician:   Result of Recommendation:      DC Planning Services  CM consult   [facility] Choice  HOME HEALTH   Choice offered to / List presented to:  [name]     HH arranged  HH-1 RN  HH-10 DISEASE MANAGEMENT      HH agency  [facility]    Status of service:    ED Comments:   ED Comments Detail:  11/19/11-1041-[name] Raap,RN,BSN [phone] **Late Entry**   On Friday, 11/16/11 at approximately 1630, NCM received a call from ED RN [name], with request for home health. Per report, social worker had interviewed patient and daughter and requested case management to arrange  home health for home safety check. Spoke with RN [name] regarding need for order from EDP. Spoke with patient via telephone, choice of hh agencies offered to patient. [facility]. [name] Lias, RN with [facility] notified of referral. No further needs identified.

## 2011-11-22 ENCOUNTER — Emergency Department (HOSPITAL_COMMUNITY): Payer: Medicare Other

## 2011-11-22 ENCOUNTER — Inpatient Hospital Stay (HOSPITAL_COMMUNITY): Payer: Medicare Other

## 2011-11-22 ENCOUNTER — Inpatient Hospital Stay (HOSPITAL_COMMUNITY)
Admission: EM | Admit: 2011-11-22 | Discharge: 2011-11-26 | DRG: 682 | Disposition: A | Discharge status: Skilled Nursing Facility | Payer: Medicare Other | Attending: Internal Medicine | Admitting: Internal Medicine

## 2011-11-22 ENCOUNTER — Encounter (HOSPITAL_COMMUNITY): Payer: Self-pay | Admitting: Emergency Medicine

## 2011-11-22 DIAGNOSIS — N289 Disorder of kidney and ureter, unspecified: Secondary | ICD-10-CM

## 2011-11-22 DIAGNOSIS — M109 Gout, unspecified: Secondary | ICD-10-CM | POA: Diagnosis present

## 2011-11-22 DIAGNOSIS — E7800 Pure hypercholesterolemia, unspecified: Secondary | ICD-10-CM

## 2011-11-22 DIAGNOSIS — K648 Other hemorrhoids: Secondary | ICD-10-CM | POA: Diagnosis not present

## 2011-11-22 DIAGNOSIS — G9340 Encephalopathy, unspecified: Secondary | ICD-10-CM | POA: Diagnosis present

## 2011-11-22 DIAGNOSIS — Z23 Encounter for immunization: Secondary | ICD-10-CM

## 2011-11-22 DIAGNOSIS — R1310 Dysphagia, unspecified: Secondary | ICD-10-CM | POA: Diagnosis present

## 2011-11-22 DIAGNOSIS — K623 Rectal prolapse: Secondary | ICD-10-CM | POA: Diagnosis present

## 2011-11-22 DIAGNOSIS — Z7982 Long term (current) use of aspirin: Secondary | ICD-10-CM

## 2011-11-22 DIAGNOSIS — E11649 Type 2 diabetes mellitus with hypoglycemia without coma: Secondary | ICD-10-CM

## 2011-11-22 DIAGNOSIS — Z79899 Other long term (current) drug therapy: Secondary | ICD-10-CM

## 2011-11-22 DIAGNOSIS — I10 Essential (primary) hypertension: Secondary | ICD-10-CM

## 2011-11-22 DIAGNOSIS — F0390 Unspecified dementia without behavioral disturbance: Secondary | ICD-10-CM | POA: Diagnosis present

## 2011-11-22 DIAGNOSIS — E876 Hypokalemia: Secondary | ICD-10-CM | POA: Diagnosis present

## 2011-11-22 DIAGNOSIS — E119 Type 2 diabetes mellitus without complications: Secondary | ICD-10-CM

## 2011-11-22 DIAGNOSIS — E860 Dehydration: Secondary | ICD-10-CM

## 2011-11-22 DIAGNOSIS — E785 Hyperlipidemia, unspecified: Secondary | ICD-10-CM | POA: Diagnosis present

## 2011-11-22 DIAGNOSIS — N179 Acute kidney failure, unspecified: Principal | ICD-10-CM | POA: Diagnosis present

## 2011-11-22 DIAGNOSIS — R5381 Other malaise: Secondary | ICD-10-CM | POA: Diagnosis present

## 2011-11-22 HISTORY — DX: Other complications of anesthesia, initial encounter: T88.59XA

## 2011-11-22 HISTORY — DX: Adverse effect of unspecified anesthetic, initial encounter: T41.45XA

## 2011-11-22 LAB — COMPREHENSIVE METABOLIC PANEL
ALT: 23 U/L (ref 0–35)
AST: 30 U/L (ref 0–37)
Albumin: 3.5 g/dL (ref 3.5–5.2)
Alkaline Phosphatase: 59 U/L (ref 39–117)
BUN: 38 mg/dL — ABNORMAL HIGH (ref 6–23)
Chloride: 100 mEq/L (ref 96–112)
Potassium: 3.2 mEq/L — ABNORMAL LOW (ref 3.5–5.1)
Total Bilirubin: 0.2 mg/dL — ABNORMAL LOW (ref 0.3–1.2)

## 2011-11-22 LAB — GLUCOSE, CAPILLARY: Glucose-Capillary: 130 mg/dL — ABNORMAL HIGH (ref 70–99)

## 2011-11-22 LAB — URINE MICROSCOPIC-ADD ON

## 2011-11-22 LAB — TROPONIN I: Troponin I: <0.3 ng/mL (ref <0.30)

## 2011-11-22 LAB — URINALYSIS, ROUTINE W REFLEX MICROSCOPIC
Glucose, UA: NEGATIVE mg/dL
Protein, ur: NEGATIVE mg/dL

## 2011-11-22 LAB — CBC
HCT: 39.9 % (ref 36.0–46.0)
Platelets: 212 10*3/uL (ref 150–400)
RDW: 13.8 % (ref 11.5–15.5)
WBC: 6 10*3/uL (ref 4.0–10.5)

## 2011-11-22 MED ORDER — ACETAMINOPHEN 650 MG RE SUPP: 650.0000 mg | Freq: Four times a day (QID) | RECTAL | Status: DC | PRN

## 2011-11-22 MED ORDER — SODIUM CHLORIDE 0.9 % IV BOLUS (SEPSIS)
500.0000 mL | Freq: Once | INTRAVENOUS | Status: AC
  Administered 2011-11-22: 500 mL via INTRAVENOUS

## 2011-11-22 MED ORDER — ASPIRIN EC 81 MG PO TBEC
81.0000 mg | DELAYED_RELEASE_TABLET | Freq: Every day | ORAL | Status: DC
  Administered 2011-11-23 – 2011-11-26 (×4): 81 mg via ORAL
  Filled 2011-11-22 (×5): qty 1

## 2011-11-22 MED ORDER — ENOXAPARIN SODIUM 30 MG/0.3ML ~~LOC~~ SOLN
30.0000 mg | SUBCUTANEOUS | Status: DC
  Administered 2011-11-23 – 2011-11-25 (×3): 30 mg via SUBCUTANEOUS
  Filled 2011-11-22 (×5): qty 0.3

## 2011-11-22 MED ORDER — ASPIRIN EC 81 MG PO TBEC: 81.0000 mg | DELAYED_RELEASE_TABLET | Freq: Every day | ORAL | Status: DC

## 2011-11-22 MED ORDER — INSULIN ASPART 100 UNIT/ML ~~LOC~~ SOLN: 0.0000 [IU] | Freq: Every day | SUBCUTANEOUS | Status: DC

## 2011-11-22 MED ORDER — POTASSIUM CHLORIDE 20 MEQ/15ML (10%) PO LIQD: 20.0000 meq | Freq: Once | ORAL | Status: DC

## 2011-11-22 MED ORDER — EZETIMIBE 10 MG PO TABS
10.0000 mg | ORAL_TABLET | Freq: Every day | ORAL | Status: DC
  Administered 2011-11-23 – 2011-11-25 (×3): 10 mg via ORAL
  Filled 2011-11-22 (×4): qty 1

## 2011-11-22 MED ORDER — ACETAMINOPHEN 325 MG PO TABS
650.0000 mg | ORAL_TABLET | Freq: Four times a day (QID) | ORAL | Status: DC | PRN
  Administered 2011-11-24: 650 mg via ORAL
  Filled 2011-11-22: qty 2

## 2011-11-22 MED ORDER — SODIUM CHLORIDE 0.9 % IV SOLN: INTRAVENOUS | Status: AC

## 2011-11-22 MED ORDER — SODIUM CHLORIDE 0.9 % IV SOLN
INTRAVENOUS | Status: DC
  Administered 2011-11-22: 75 mL/h via INTRAVENOUS
  Administered 2011-11-22: 16:00:00 via INTRAVENOUS
  Administered 2011-11-23: 1000 mL via INTRAVENOUS

## 2011-11-22 MED ORDER — SIMVASTATIN 20 MG PO TABS
20.0000 mg | ORAL_TABLET | Freq: Every evening | ORAL | Status: DC
  Administered 2011-11-23 – 2011-11-24 (×2): 20 mg via ORAL
  Filled 2011-11-22 (×4): qty 1

## 2011-11-22 MED ORDER — POTASSIUM CHLORIDE 20 MEQ/15ML (10%) PO LIQD
20.0000 meq | Freq: Once | ORAL | Status: AC
  Administered 2011-11-22: 20 meq via ORAL
  Filled 2011-11-22: qty 15

## 2011-11-22 MED ORDER — POTASSIUM CHLORIDE 10 MEQ/100ML IV SOLN
10.0000 meq | Freq: Once | INTRAVENOUS | Status: AC
  Administered 2011-11-22: 10 meq via INTRAVENOUS
  Filled 2011-11-22: qty 100

## 2011-11-22 MED ORDER — INSULIN ASPART 100 UNIT/ML ~~LOC~~ SOLN
0.0000 [IU] | Freq: Three times a day (TID) | SUBCUTANEOUS | Status: DC
  Administered 2011-11-23 (×3): 2 [IU] via SUBCUTANEOUS
  Administered 2011-11-24 (×2): 1 [IU] via SUBCUTANEOUS
  Administered 2011-11-24 – 2011-11-25 (×3): 2 [IU] via SUBCUTANEOUS

## 2011-11-22 NOTE — ED Notes (Signed)
Was seen at dr office cbg was 235 there sent for ? Admission and ? Nursing home placement

## 2011-11-22 NOTE — ED Notes (Signed)
Pt was seen at [facility] Friday for weakness - Daughter states "they didn't find anything wrong." States Pt is weaker today than Friday.

## 2011-11-22 NOTE — ED Notes (Signed)
Family at bedside. 

## 2011-11-22 NOTE — ED Notes (Signed)
Report called to [facility]

## 2011-11-22 NOTE — H&P (Signed)
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 The patient is a poor historian partly due to her encephalopathy at this time. Much of the history is obtained from the daughter who is at the bedside. The daughter relates that the patient's weakness has progressively gotten worse over the past week. In fact, the patient has had much difficulty getting out of bed and has required her assistance in this past week. In addition, the patient has been very unstable on her feet, but she has not fallen. The daughter states that the patient has been using a number of to aid her ambulation as well as a shopping cart at home. The patient lives alone at home. Daughter states that she lives 5 minutes away, and she visits twice a day. There has been no new medications introduced in the past few weeks.  The patient denies any fevers, chills, chest pain, shortness of breath, nausea, vomiting, abdominal pain, dysuria, headaches, falls, syncope. The patient complains of difficulty swallowing. An EGD was performed by Dr. Jeani Hawking about one month ago which was negative according to the patient's daughter. The patient frequently eats without her dentures, but the daughter states that the patient continues to have difficulty swallowing even when she has dentures. Most of the difficulty swallowing involves solid foods, but not to liquids. The patient also relates new onset right arm weakness that started this morning around 8 to 9 am. She complains some dizziness but denies any slurred speech or visual changes at this time. The daughter also relates that the patient frequently forgets to take her medications on a regular basis. Assessment/Plan: Acute kidney injury -A combination of volume depletion, ARB,  Bumex at home, and recent CT contrast approximately 6 days ago. -Gentle hydration with normal saline Generalized weakness/right arm weakness -Check TSH, UA and urine culture -MRI brain -Carotid ultrasound Hypercalcemia -Likely due to the patient's acute kidney injury and dehydration Dysphagia -I have consulted Dr. Jeani Hawking, gastroenterology -Speech eval Diabetes mellitus type 2 -Discontinue glyburide and metformin -NovoLog sliding scale for now -Check hemoglobin A1c  Hyperlipidemia -Continue Zocor for now Hypertension -Hold losartan and Bumex for now -Consider non-dihydropyridine calcium channel blocker     Past surgical history: Hysterectomy, shoulder surgery   Past Medical History  Diagnosis Date  . UTI (urinary tract infection)   . Hypertension   . Diabetes mellitus   . Gout   . High cholesterol    History reviewed. No pertinent past surgical history. Social History:  does not have a smoking history on file. She does not have any smokeless tobacco history on file. She reports that she does not drink alcohol or use illicit drugs.  No Known Allergies  No family history on file.  Prior to Admission medications   Medication Sig Start Date End Date Taking? Authorizing Provider  allopurinol (ZYLOPRIM) 100 MG tablet Take 100 mg by mouth 2 (two) times daily.   Yes Historical Provider, MD  amLODipine (NORVASC) 5 MG tablet Take 5 mg by mouth daily.   Yes Historical Provider, MD  aspirin EC 81 MG tablet Take 81 mg by mouth daily.   Yes Historical Provider, MD  bumetanide (BUMEX) 1 MG tablet Take 1 mg by mouth daily.   Yes Historical Provider, MD  ezetimibe (ZETIA) 10 MG tablet Take 10 mg  by mouth daily.   Yes Historical Provider, MD  gabapentin (NEURONTIN) 300 MG capsule Take 300 mg by mouth 2 (two) times daily.    Yes Historical Provider, MD  glyBURIDE-metformin (GLUCOVANCE) 1.25-250 MG per tablet Take 1 tablet by mouth 2 (two) times daily with a meal.   Yes Historical  Provider, MD  glycopyrrolate (ROBINUL) 1 MG tablet Take 1 mg by mouth 2 (two) times daily.   Yes Historical Provider, MD  omeprazole (PRILOSEC) 40 MG capsule Take 40 mg by mouth daily.   Yes Historical Provider, MD  simvastatin (ZOCOR) 40 MG tablet Take 20 mg by mouth every evening.   Yes Historical Provider, MD    Review of Systems:  Constitutional:  9 pound weight loss, night sweats, Fevers, chills, fatigue.  Head&Eyes: No headache.  No vision loss.  No eye pain or scotoma ENT:  No Difficulty swallowing,Tooth/dental problems,Sore throat,  No ear ache, post nasal drip,  Cardio-vascular:  No chest pain, Orthopnea, PND, swelling in lower extremities,  dizziness, palpitations  GI:  No heartburn, indigestion, abdominal pain, nausea, vomiting, diarrhea,loss of appetite, hematochezia, melena Resp:  No shortness of breath with exertion or at rest. No excess mucus, no productive cough, No non-productive cough, No coughing up of blood.No change in color of mucus.No wheezing.No chest wall deformity  Skin:  no rash or lesions.  GU:  no dysuria, change in color of urine, no urgency or frequency. No flank pain.  Musculoskeletal:  No joint pain or swelling. No decreased range of motion. No back pain.  Psych:  No change in mood or affect. No depression or anxiety. Neurologic: No headache, no dysesthesia, no focal weakness, no vision loss. No syncope  Physical Exam: Filed Vitals:   11/22/11 1134 11/22/11 1411 11/22/11 1527  BP: 124/79 153/78 160/85  Pulse: 74 60 60  Temp: 98.4 F (36.9 C)    TempSrc: Oral    Resp: 20 20 20   SpO2: 97% 96% 96%   General:  A&O x 3, NAD, nontoxic, pleasant/cooperative Head/Eye: No conjunctival hemorrhage, no icterus, [place]/AT, No nystagmus ENT:  No icterus,  No thrush, no pharyngeal exudate. Edentulous Neck:  No masses, no lymphadenpathy, no bruits CV:  RRR, no rub, no gallop, no S3 Lung:  CTAB, good air movement, no wheeze, no rhonchi Abdomen: soft/NT,  +BS, nondistended, no peritoneal signs Ext: No cyanosis, No rashes, No petechiae, No lymphangitis, No edema Neuro: CNII-XII intact, strength 4-/5 in bilateral upper and lower extremities  Labs on Admission:  Basic Metabolic Panel:  Lab 11/22/11 1610 11/16/11 1248  NA 142 145  K 3.2* 3.4*  CL 100 103  CO2 34* 30  GLUCOSE 203* 216*  BUN 38* 20  CREATININE 1.75* 1.05  CALCIUM 10.4 10.0  MG -- --  PHOS -- --   Liver Function Tests:  Lab 11/22/11 1242 11/16/11 1248  AST 30 24  ALT 23 21  ALKPHOS 59 84  BILITOT 0.2* 0.2*  PROT 7.0 7.5  ALBUMIN 3.5 3.7    Lab 11/16/11 1247  LIPASE 57  AMYLASE --   No results found for this basename: AMMONIA:5 in the last 168 hours CBC:  Lab 11/22/11 1242 11/16/11 1248  WBC 6.0 6.7  NEUTROABS -- 3.6  HGB 13.5 13.3  HCT 39.9 39.3  MCV 88.3 88.1  PLT 212 230   Cardiac Enzymes:  Lab 11/22/11 1242  CKTOTAL --  CKMB --  CKMBINDEX --  TROPONINI <0.30   BNP: No components found with this basename: POCBNP:5  CBG:  Lab 11/16/11 1414  GLUCAP 162*    Radiological Exams on Admission: Dg Chest 2 View  11/22/2011  *RADIOLOGY REPORT*  Clinical Data: Weakness  CHEST - 2 VIEW  Comparison: 12/22/2010  Findings: Cardiomediastinal silhouette is stable.  No acute infiltrate or pulmonary edema.  Bony thorax is stable.  Again noted ectatic tortuous thoracic aorta.  Atherosclerotic calcifications of thoracic aorta are stable. Stable mild degenerative changes bilateral shoulders.  IMPRESSION: No active disease.  No significant change.   Original Report Authenticated By: Natasha Mead, M.D.    Ct Head Wo Contrast  11/22/2011  *RADIOLOGY REPORT*  Clinical Data: Pain, weakness, weight loss  CT HEAD WITHOUT CONTRAST  Technique:  Contiguous axial images were obtained from the base of the skull through the vertex without contrast.  Comparison: 12/22/2010  Findings: No evidence of parenchymal hemorrhage or extra-axial fluid collection. No mass lesion, mass  effect, or midline shift.  No CT evidence of acute infarction.  Subcortical white matter and periventricular small vessel ischemic changes.  Intracranial atherosclerosis.  Global cortical atrophy, possibly age appropriate.  Secondary ventricular prominence.  Mild mucosal thickening in the right maxillary sinus.  Visualized paranasal sinuses and mastoid air cells are otherwise clear.  No evidence of calvarial fracture.  IMPRESSION: No evidence of acute intracranial abnormality.  Atrophy with small vessel ischemic changes and intracranial atherosclerosis.   Original Report Authenticated By: Charline Bills, M.D.     EKG: Sinus rhythm, T-wave inversion V3-V6   Time spend:70 minutes Code Status: full Family Communication: daughter at bedside   Markees Carns, DO  [facility] Pager [phone]  If 7PM-7AM, please contact night-coverage [email or web address] Password [facility] 11/22/2011, 3:57 PM

## 2011-11-22 NOTE — ED Notes (Signed)
PT AND DAUGHTER MADE COMFORTABLE. PT IS ALERT AND VERY PERSONABLE. SHE STATES SHE CANT EAT AT HOME BECAUSE SHE FEELS LIKE THINGS ARE GETTING STUCK IN HER THROAT AND SHE FEELS LIKE IT IS STUCK OR SHE IS CHOKING. STATES THIS IS WHY SHE CANT EAT.

## 2011-11-22 NOTE — Progress Notes (Signed)
Disposition Note  [name], is a 78 y.o. female,   MRN: [id number]  -  DOB - 07-17-1917  Outpatient Primary MD for the patient is AVBUERE,EDWIN A, MD   Blood pressure 124/79, pulse 74, temperature 98.4 F (36.9 C), temperature source Oral, resp. rate 20, SpO2 97.00%.  Active Problems:  Dysphagia  Acute renal failure  Hypokalemia   78 yo female comes to the ED complaining generalized weakness (worse over the last several weeks), difficulty swallowing, weight loss and impaired memory.  Patient visited the ED on 11/16/11 for the same issues.  Family states she is even more weak today.  In the ED the patient is found to have acute renal failure (creatinine was 1.01 on 9/13 and today is 1.75), and hypokalemia.  U/A is still pending.  She has had a recent work up started for dysphagia - with EGD done by Dr. Elnoria Howard.  Family states results of the EGD were non-revealing.   They are fearful she will need placement in ALF or SNF.  I have requested a med surge bed for Acute Renal Failure and Hypokalemia.    Algis Downs, PA-C [facility] Pager: [phone]

## 2011-11-22 NOTE — ED Provider Notes (Addendum)
History     CSN: [id number]  Arrival date & time 11/22/11  1130   First MD Initiated Contact with Patient 11/22/11 1223      Chief Complaint  Patient presents with  . Weakness  . Dysphagia    (Consider location/radiation/quality/duration/timing/severity/associated sxs/prior treatment) Patient is a 78 y.o. female presenting with weakness. The history is provided by the patient and a relative.  Weakness Primary symptoms do not include headaches, fever or vomiting.  Additional symptoms include weakness.  pt w generalized weakness for past few weeks, slowly worse. Pt eating and drinking relatively little. Reports difficulty swallowing solids. Denies choking or gagging. Had gi eval last month for same including egd, Dr Elnoria Howard - no specific dx made. Approximately 10 lb wt loss. Family also notes impaired memory in past few weeks. Pt lives independently, but family feels may need ecf.  Pt denies headache. No sore throat. No cp or sob. No abd pain. No nvd. No gu c/o. No fever or chills. No change in speech or vision, no numbness/weakness. Denies recent change in meds.     Past Medical History  Diagnosis Date  . UTI (urinary tract infection)   . Hypertension   . Diabetes mellitus   . Gout   . High cholesterol     History reviewed. No pertinent past surgical history.  No family history on file.  History  Substance Use Topics  . Smoking status: Not on file  . Smokeless tobacco: Not on file  . Alcohol Use: No    OB History    Grav Para Term Preterm Abortions TAB SAB Ect Mult Living                  Review of Systems  Constitutional: Negative for fever and chills.  HENT: Negative for sore throat and neck pain.   Eyes: Negative for redness.  Respiratory: Negative for cough and shortness of breath.   Cardiovascular: Negative for chest pain.  Gastrointestinal: Negative for vomiting, abdominal pain and diarrhea.  Genitourinary: Negative for dysuria and flank pain.    Musculoskeletal: Negative for back pain.  Skin: Negative for rash.  Neurological: Positive for weakness. Negative for headaches.  Hematological: Does not bruise/bleed easily.  Psychiatric/Behavioral: Negative for agitation.    Allergies  Review of patient's allergies indicates no known allergies.  Home Medications   Current Outpatient Rx  Name Route Sig Dispense Refill  . ALLOPURINOL 100 MG PO TABS Oral Take 100 mg by mouth 2 (two) times daily.    Marland Kitchen AMLODIPINE BESYLATE 5 MG PO TABS Oral Take 5 mg by mouth daily.    . ASPIRIN EC 81 MG PO TBEC Oral Take 81 mg by mouth daily.    . BUMETANIDE 1 MG PO TABS Oral Take 1 mg by mouth daily.    Marland Kitchen EZETIMIBE 10 MG PO TABS Oral Take 10 mg by mouth daily.    Marland Kitchen GABAPENTIN 300 MG PO CAPS Oral Take 300 mg by mouth 2 (two) times daily.     . GLYBURIDE-METFORMIN 1.25-250 MG PO TABS Oral Take 1 tablet by mouth 2 (two) times daily with a meal.    . GLYCOPYRROLATE 1 MG PO TABS Oral Take 1 mg by mouth 2 (two) times daily.    Marland Kitchen OMEPRAZOLE 40 MG PO CPDR Oral Take 40 mg by mouth daily.    Marland Kitchen SIMVASTATIN 40 MG PO TABS Oral Take 20 mg by mouth every evening.      BP 124/79  Pulse 74  Temp 98.4 F (36.9 C) (Oral)  Resp 20  SpO2 97%  Physical Exam  Nursing note and vitals reviewed. Constitutional: She appears well-developed. No distress.  HENT:  Nose: Nose normal.       Dry mm.   Eyes: Conjunctivae normal are normal. Pupils are equal, round, and reactive to light. No scleral icterus.  Neck: Neck supple. No tracheal deviation present. No thyromegaly present.  Cardiovascular: Normal rate, regular rhythm, normal heart sounds and intact distal pulses.   Pulmonary/Chest: Effort normal and breath sounds normal. No respiratory distress.  Abdominal: Soft. Normal appearance and bowel sounds are normal. She exhibits no distension and no mass. There is no tenderness. There is no rebound and no guarding.  Musculoskeletal: She exhibits no edema and no tenderness.   Neurological: She is alert.       Alert, oriented. Speech fluent. Motor intact bil.   Skin: Skin is warm and dry. No rash noted.  Psychiatric: She has a normal mood and affect.    ED Course  Procedures (including critical care time)   Labs Reviewed  URINALYSIS, ROUTINE W REFLEX MICROSCOPIC  CBC  COMPREHENSIVE METABOLIC PANEL  TROPONIN I   Results for orders placed during the hospital encounter of 11/22/11  CBC      Component Value Range   WBC 6.0  4.0 - 10.5 K/uL   RBC 4.52  3.87 - 5.11 MIL/uL   Hemoglobin 13.5  12.0 - 15.0 g/dL   HCT 16.1  09.6 - 04.5 %   MCV 88.3  78.0 - 100.0 fL   MCH 29.9  26.0 - 34.0 pg   MCHC 33.8  30.0 - 36.0 g/dL   RDW 40.9  81.1 - 91.4 %   Platelets 212  150 - 400 K/uL  COMPREHENSIVE METABOLIC PANEL      Component Value Range   Sodium 142  135 - 145 mEq/L   Potassium 3.2 (*) 3.5 - 5.1 mEq/L   Chloride 100  96 - 112 mEq/L   CO2 34 (*) 19 - 32 mEq/L   Glucose, Bld 203 (*) 70 - 99 mg/dL   BUN 38 (*) 6 - 23 mg/dL   Creatinine, Ser 7.82 (*) 0.50 - 1.10 mg/dL   Calcium 95.6  8.4 - 21.3 mg/dL   Total Protein 7.0  6.0 - 8.3 g/dL   Albumin 3.5  3.5 - 5.2 g/dL   AST 30  0 - 37 U/L   ALT 23  0 - 35 U/L   Alkaline Phosphatase 59  39 - 117 U/L   Total Bilirubin 0.2 (*) 0.3 - 1.2 mg/dL   GFR calc non Af Amer 24 (*) >90 mL/min   GFR calc Af Amer 28 (*) >90 mL/min  TROPONIN I      Component Value Range   Troponin I <0.30  <0.30 ng/mL   Dg Chest 2 View  11/22/2011  *RADIOLOGY REPORT*  Clinical Data: Weakness  CHEST - 2 VIEW  Comparison: 12/22/2010  Findings: Cardiomediastinal silhouette is stable.  No acute infiltrate or pulmonary edema.  Bony thorax is stable.  Again noted ectatic tortuous thoracic aorta.  Atherosclerotic calcifications of thoracic aorta are stable. Stable mild degenerative changes bilateral shoulders.  IMPRESSION: No active disease.  No significant change.   Original Report Authenticated By: Natasha Mead, M.D.    Ct Head Wo  Contrast  11/22/2011  *RADIOLOGY REPORT*  Clinical Data: Pain, weakness, weight loss  CT HEAD WITHOUT CONTRAST  Technique:  Contiguous axial images were obtained  from the base of the skull through the vertex without contrast.  Comparison: 12/22/2010  Findings: No evidence of parenchymal hemorrhage or extra-axial fluid collection. No mass lesion, mass effect, or midline shift.  No CT evidence of acute infarction.  Subcortical white matter and periventricular small vessel ischemic changes.  Intracranial atherosclerosis.  Global cortical atrophy, possibly age appropriate.  Secondary ventricular prominence.  Mild mucosal thickening in the right maxillary sinus.  Visualized paranasal sinuses and mastoid air cells are otherwise clear.  No evidence of calvarial fracture.  IMPRESSION: No evidence of acute intracranial abnormality.  Atrophy with small vessel ischemic changes and intracranial atherosclerosis.   Original Report Authenticated By: Charline Bills, M.D.    Ct Abdomen Pelvis W Contrast  11/16/2011  *RADIOLOGY REPORT*  Clinical Data: Abdominal pain, vomiting  CT ABDOMEN AND PELVIS WITH CONTRAST  Technique:  Multidetector CT imaging of the abdomen and pelvis was performed following the standard protocol during bolus administration of intravenous contrast.  Contrast: 80mL OMNIPAQUE IOHEXOL 300 MG/ML  SOLN  Comparison: CT abdomen pelvis of 09/20/2011  Findings: The lung bases are clear with no change in probable calcified granuloma at the right lung base above hemidiaphragm. Cardiomegaly is stable.  There is atheromatous change throughout the distal thoracic aorta.  The liver enhances with no significant interval change noted.  No ductal dilatation is present.  No calcified gallstones are noted.  The common bile duct and pancreatic duct are slightly prominent but stable, and the pancreas is normal in size with no interval change evident.  The adrenal glands and spleen are unremarkable.  The stomach is not well  distended.  The kidneys enhance with no calculus or mass and no hydronephrosis is seen.  On delayed images, the pelvocaliceal systems are unremarkable.  There is fusiform dilatation of the abdominal aorta but no focal aneurysm is seen.  Moderate atheromatous changes noted.  The common iliac arteries are very tortuous.  The uterus is to the left midline and contains dense calcification consistent with uterine fibroids.  No adnexal lesion is seen.  No free fluid is noted within the pelvis.  The urinary bladder is moderately distended with no abnormality noted.  Multiple rectosigmoid colonic diverticula are present.  The appendix fills with contrast and is unremarkable.  No distention of small bowel loops is seen.  There is degenerative disc disease at the L3-4 level.  IMPRESSION:  1.  No explanation for the patient's pain is seen. 2.  Somewhat prominent pancreatic duct which appears stable when compared to the prior exam. 3.  Fusiform dilatation of the abdominal aorta with diffuse atheromatous change. 4.  Multiple rectosigmoid colonic diverticula.   Original Report Authenticated By: Juline Patch, M.D.       MDM  Iv ns bolus. Labs.  Reviewed nursing notes and prior charts for additional history.  Recent egd neg.      Date: 11/22/2011  Rate: 64  Rhythm: normal sinus rhythm  QRS Axis: left  Intervals: normal  ST/T Wave abnormalities: nonspecific ST/T changes  Conduction Disutrbances:left anterior fascicular block  Narrative Interpretation:   Old EKG Reviewed: changes noted   Pt with dehydration.  Additional iv ns bolus.  kcl po.   Med service called to admit.   Discussed w [facility], admit to team 10, gen bed.        Suzi Roots, MD 11/22/11 1350  Suzi Roots, MD 11/22/11 [phone]

## 2011-11-22 NOTE — ED Notes (Signed)
Family states that pt was here last Friday for same issues , weakness amd not eating and falling and weight loss. Pt may need home placement pt lives by herself

## 2011-11-22 NOTE — Consult Note (Signed)
Reason for Consult: Dysphagia Referring Physician: [facility]  [name] HPI: This is a 78 year old female who is well known to me for her complaints of dysphagia and dysgeusia.  This is not a new complaint, in fact, it has been ongoing for the past couple of years.  However, of late she reports having more episodes of dysphagia and spitting up.  I performed an EGD recently and it was negative for any evidence of strictures.  Further discussion with her daughter suggested that she spit out the food that are not palatable to her and she had some issues with her dentures.  Since the EGD she has been using her dentures and the dysphagia issue persists.  Currently the patient is confused and she was admitted with generalized weakness.  I was only able to obtain the history for a family member.   Past Medical History  Diagnosis Date  . UTI (urinary tract infection)   . Hypertension   . Diabetes mellitus   . Gout   . High cholesterol     History reviewed. No pertinent past surgical history.  No family history on file.  Social History:  does not have a smoking history on file. She does not have any smokeless tobacco history on file. She reports that she does not drink alcohol or use illicit drugs.  Allergies: No Known Allergies  Medications:  Scheduled:   . sodium chloride   Intravenous STAT  . aspirin EC  81 mg Oral Daily  . enoxaparin (LOVENOX) injection  30 mg Subcutaneous Q24H  . ezetimibe  10 mg Oral Daily  . insulin aspart  0-5 Units Subcutaneous QHS  . insulin aspart  0-9 Units Subcutaneous TID WC  . potassium chloride  10 mEq Intravenous Once  . potassium chloride  20 mEq Oral Once  . simvastatin  20 mg Oral QPM  . sodium chloride  500 mL Intravenous Once  . sodium chloride  500 mL Intravenous Once  . DISCONTD: aspirin EC  81 mg Oral Daily  . DISCONTD: potassium chloride  20 mEq Oral Once   Continuous:   . sodium chloride 75 mL/hr at 11/22/11 1628    Results  for orders placed during the hospital encounter of 11/22/11 (from the past 24 hour(s))  CBC     Status: Normal   Collection Time   11/22/11 12:42 PM      Component Value Range   WBC 6.0  4.0 - 10.5 K/uL   RBC 4.52  3.87 - 5.11 MIL/uL   Hemoglobin 13.5  12.0 - 15.0 g/dL   HCT 96.0  45.4 - 09.8 %   MCV 88.3  78.0 - 100.0 fL   MCH 29.9  26.0 - 34.0 pg   MCHC 33.8  30.0 - 36.0 g/dL   RDW 11.9  14.7 - 82.9 %   Platelets 212  150 - 400 K/uL  COMPREHENSIVE METABOLIC PANEL     Status: Abnormal   Collection Time   11/22/11 12:42 PM      Component Value Range   Sodium 142  135 - 145 mEq/L   Potassium 3.2 (*) 3.5 - 5.1 mEq/L   Chloride 100  96 - 112 mEq/L   CO2 34 (*) 19 - 32 mEq/L   Glucose, Bld 203 (*) 70 - 99 mg/dL   BUN 38 (*) 6 - 23 mg/dL   Creatinine, Ser 5.62 (*) 0.50 - 1.10 mg/dL   Calcium 13.0  8.4 - 86.5 mg/dL  Total Protein 7.0  6.0 - 8.3 g/dL   Albumin 3.5  3.5 - 5.2 g/dL   AST 30  0 - 37 U/L   ALT 23  0 - 35 U/L   Alkaline Phosphatase 59  39 - 117 U/L   Total Bilirubin 0.2 (*) 0.3 - 1.2 mg/dL   GFR calc non Af Amer 24 (*) >90 mL/min   GFR calc Af Amer 28 (*) >90 mL/min  TROPONIN I     Status: Normal   Collection Time   11/22/11 12:42 PM      Component Value Range   Troponin I <0.30  <0.30 ng/mL  URINALYSIS, ROUTINE W REFLEX MICROSCOPIC     Status: Abnormal   Collection Time   11/22/11  3:04 PM      Component Value Range   Color, Urine YELLOW  YELLOW   APPearance CLEAR  CLEAR   Specific Gravity, Urine 1.013  1.005 - 1.030   pH 6.5  5.0 - 8.0   Glucose, UA NEGATIVE  NEGATIVE mg/dL   Hgb urine dipstick TRACE (*) NEGATIVE   Bilirubin Urine NEGATIVE  NEGATIVE   Ketones, ur NEGATIVE  NEGATIVE mg/dL   Protein, ur NEGATIVE  NEGATIVE mg/dL   Urobilinogen, UA 0.2  0.0 - 1.0 mg/dL   Nitrite NEGATIVE  NEGATIVE   Leukocytes, UA SMALL (*) NEGATIVE  URINE MICROSCOPIC-ADD ON     Status: Abnormal   Collection Time   11/22/11  3:04 PM      Component Value Range   Squamous  Epithelial / LPF MANY (*) RARE   WBC, UA 3-6  <3 WBC/hpf   RBC / HPF 0-2  <3 RBC/hpf   Bacteria, UA RARE  RARE  GLUCOSE, CAPILLARY     Status: Normal   Collection Time   11/22/11  4:18 PM      Component Value Range   Glucose-Capillary 89  70 - 99 mg/dL  CK     Status: Abnormal   Collection Time   11/22/11  5:26 PM      Component Value Range   Total CK 374 (*) 7 - 177 U/L  TROPONIN I     Status: Normal   Collection Time   11/22/11  5:26 PM      Component Value Range   Troponin I <0.30  <0.30 ng/mL     Dg Chest 2 View  11/22/2011  *RADIOLOGY REPORT*  Clinical Data: Weakness  CHEST - 2 VIEW  Comparison: 12/22/2010  Findings: Cardiomediastinal silhouette is stable.  No acute infiltrate or pulmonary edema.  Bony thorax is stable.  Again noted ectatic tortuous thoracic aorta.  Atherosclerotic calcifications of thoracic aorta are stable. Stable mild degenerative changes bilateral shoulders.  IMPRESSION: No active disease.  No significant change.   Original Report Authenticated By: Natasha Mead, M.D.    Ct Head Wo Contrast  11/22/2011  *RADIOLOGY REPORT*  Clinical Data: Pain, weakness, weight loss  CT HEAD WITHOUT CONTRAST  Technique:  Contiguous axial images were obtained from the base of the skull through the vertex without contrast.  Comparison: 12/22/2010  Findings: No evidence of parenchymal hemorrhage or extra-axial fluid collection. No mass lesion, mass effect, or midline shift.  No CT evidence of acute infarction.  Subcortical white matter and periventricular small vessel ischemic changes.  Intracranial atherosclerosis.  Global cortical atrophy, possibly age appropriate.  Secondary ventricular prominence.  Mild mucosal thickening in the right maxillary sinus.  Visualized paranasal sinuses and mastoid air cells are otherwise clear.  No evidence of calvarial fracture.  IMPRESSION: No evidence of acute intracranial abnormality.  Atrophy with small vessel ischemic changes and intracranial  atherosclerosis.   Original Report Authenticated By: Charline Bills, M.D.     ROS:  As stated above in the HPI otherwise negative.  Blood pressure 160/85, pulse 60, temperature 98.4 F (36.9 C), temperature source Oral, resp. rate 20, SpO2 96.00%.    PE: Gen: NAD, Alert and Oriented HEENT:  [place]/AT, EOMI Neck: Supple, no LAD Lungs: CTA Bilaterally CV: RRR without M/G/R ABM: Soft, NTND, +BS Ext: No C/C/E  Assessment/Plan: 1) Dysphagia 2)Dysgeusia. 3) Weakness.   Her current GI issues are difficult to resolve.  She may have a motility disorder and further evaluation with a manometry will be performed once her acute issues have resolved.  At this time, I think it is fine for her to tolerate a regular diet and she can pick and choose what she desires to eat.    Plan: 1) I will arrange for outpatient work up of her dysphagia with a manometry. 2) No need for EGD at this time. 3) I will wait until her acute issues have resolved before pursuing further work up.  Leocadio Heal D 11/22/2011, 6:33 PM

## 2011-11-23 DIAGNOSIS — R42 Dizziness and giddiness: Secondary | ICD-10-CM

## 2011-11-23 LAB — CBC
HCT: 36.7 % (ref 36.0–46.0)
Hemoglobin: 12.2 g/dL (ref 12.0–15.0)
MCH: 29.6 pg (ref 26.0–34.0)
MCV: 89.1 fL (ref 78.0–100.0)
RBC: 4.12 MIL/uL (ref 3.87–5.11)

## 2011-11-23 LAB — GLUCOSE, CAPILLARY
Glucose-Capillary: 198 mg/dL — ABNORMAL HIGH (ref 70–99)
Glucose-Capillary: 127 mg/dL — ABNORMAL HIGH (ref 70–99)

## 2011-11-23 LAB — BASIC METABOLIC PANEL
CO2: 30 mEq/L (ref 19–32)
Calcium: 9.4 mg/dL (ref 8.4–10.5)
Creatinine, Ser: 1.33 mg/dL — ABNORMAL HIGH (ref 0.50–1.10)
Glucose, Bld: 180 mg/dL — ABNORMAL HIGH (ref 70–99)

## 2011-11-23 LAB — VITAMIN B12: Vitamin B-12: 1526 pg/mL — ABNORMAL HIGH (ref 211–911)

## 2011-11-23 LAB — TSH: TSH: 1.402 u[IU]/mL (ref 0.350–4.500)

## 2011-11-23 MED ORDER — HYDROCORTISONE 1 % EX CREA
TOPICAL_CREAM | Freq: Two times a day (BID) | CUTANEOUS | Status: DC
  Administered 2011-11-23 – 2011-11-26 (×5): via TOPICAL
  Filled 2011-11-23: qty 28

## 2011-11-23 MED ORDER — AMLODIPINE BESYLATE 5 MG PO TABS
5.0000 mg | ORAL_TABLET | Freq: Every day | ORAL | Status: DC
  Administered 2011-11-23 – 2011-11-24 (×2): 5 mg via ORAL
  Filled 2011-11-23 (×3): qty 1

## 2011-11-23 NOTE — Progress Notes (Signed)
Utilization review completed.  

## 2011-11-23 NOTE — Evaluation (Signed)
Clinical/Bedside Swallow Evaluation Patient Details  Name: [name] MRN: [id number] Date of Birth: 05/31/1917  Today's Date: 11/23/2011 Time: 8295-6213 SLP Time Calculation (min): 27 min  Past Medical History:  Past Medical History  Diagnosis Date  . UTI (urinary tract infection)   . Hypertension   . Diabetes mellitus   . Gout   . High cholesterol    Past Surgical History: History reviewed. No pertinent past surgical history. HPI:  This is a 78 year old female who is well known to me for her complaints of dysphagia and dysgeusia.  This is not a new complaint, in fact, it has been ongoing for the past couple of years.  However, of late she reports having more episodes of dysphagia and spitting up.  I performed an EGD recently and it was negative for any evidence of strictures.  Further discussion with her daughter suggested that she spit out the food that are not palatable to her and she had some issues with her dentures.  Since the EGD she has been using her dentures and the dysphagia issue persists.  Currently the patient is confused and she was admitted with generalized weakness.  I was only able to obtain the history for a family member.    Assessment / Plan / Recommendation Clinical Impression  Patient's symptoms are consistent with an esophageal dysphagia, and she is currently followed by Dr. Elnoria Howard, GI.  Patient c/o solid food dysphagia, reporting that food sticks and will not go into her stomach.  This is apparently an intermittent problem, and patient states she has "tried everything."  Nothing works.    Aspiration Risk  Mild    Diet Recommendation Dysphagia 2 (Fine chop);Thin liquid (Add gravies)   Liquid Administration via: Straw;Cup Medication Administration: Whole meds with puree Supervision: Patient able to self feed;Full supervision/cueing for compensatory strategies Compensations: Slow rate;Small sips/bites;Follow solids with liquid Postural Changes and/or  Swallow Maneuvers: Seated upright 90 degrees;Out of bed for meals    Other  Recommendations Recommended Consults: Consider esophageal assessment (Question if decreased UES relaxation) Oral Care Recommendations: Oral care QID Other Recommendations: Clarify dietary restrictions   Follow Up Recommendations  24 hour supervision/assistance    Frequency and Duration min 1 x/week  1 week   Pertinent Vitals/Pain n/a    SLP Swallow Goals Patient will consume recommended diet without observed clinical signs of aspiration with: Supervision/safety;Set-up Patient will utilize recommended strategies during swallow to increase swallowing safety with: Minimal assistance   Swallow Study Prior Functional Status       General Date of Onset:  (Patient unable to state.  Evidently a chronic issue per hx.) HPI: This is a 78 year old female who is well known to me for her complaints of dysphagia and dysgeusia.  This is not a new complaint, in fact, it has been ongoing for the past couple of years.  However, of late she reports having more episodes of dysphagia and spitting up.  I performed an EGD recently and it was negative for any evidence of strictures.  Further discussion with her daughter suggested that she spit out the food that are not palatable to her and she had some issues with her dentures.  Since the EGD she has been using her dentures and the dysphagia issue persists.  Currently the patient is confused and she was admitted with generalized weakness.  I was only able to obtain the history for a family member.  Type of Study: Bedside swallow evaluation Diet Prior to this  Study: Dysphagia 3 (soft);Thin liquids Temperature Spikes Noted: No History of Recent Intubation: No Behavior/Cognition: Alert;Distractible;Requires cueing;Decreased sustained attention Oral Cavity - Dentition: Dentures, top;Dentures, bottom (Dentures beside her on bed.  ) Self-Feeding Abilities: Able to feed self;Needs set  up Patient Positioning: Upright in bed Baseline Vocal Quality: Clear Volitional Cough: Strong Volitional Swallow: Able to elicit    Oral/Motor/Sensory Function Overall Oral Motor/Sensory Function: Appears within functional limits for tasks assessed   Ice Chips Ice chips: Not tested   Thin Liquid Thin Liquid: Within functional limits    Nectar Thick Nectar Thick Liquid: Not tested   Honey Thick Honey Thick Liquid: Not tested   Puree Puree: Within functional limits ("That's no problem.") Presentation: Spoon   Solid   GO    Solid: Impaired Presentation: Self Fed Oral Phase Functional Implications:  (Prolonged chewing with bread.)       [name] T 11/23/2011,2:31 PM

## 2011-11-23 NOTE — Progress Notes (Signed)
[facility] PROGRESS NOTE  [name] ZOX:[id number] DOB: 05/28/1917 DOA: 11/22/2011 PCP: [name], MD  Assessment/Plan: Acute kidney injury  -A combination of volume depletion, ARB, Bumex at home, and recent CT contrast approximately 6 days ago.  -Gentle hydration with normal saline  Generalized weakness/right arm weakness  -UA is negative  -MRI brain shows advanced atrophy, no acute stroke -Carotid ultrasound negative Memory loss -Suspect underlying dementia -Check B12, folate, RPR -Followup TSH Hypercalcemia  -Likely due to the patient's acute kidney injury and dehydration  -Improved with intravenous fluids Dysphagia  -Appreciate Dr. Jeani Hawking, gastroenterology  -Speech eval pending Diabetes mellitus type 2  -Discontinue glyburide and metformin  -NovoLog sliding scale for now  -Awaiting hemoglobin A1c  Hyperlipidemia  -Continue Zocor for now  Hypertension  -Hold losartan and Bumex for now  -Start amlodipine Deconditioning -PT/OT eval -Plan for skilled nursing facility; consult social work Rectal prolapse -Prolapse is very mild and is reducible -Patient has internal hemorrhoids -Prescribed Preparation H -Discontinue aspirin if bleeding worsens     Procedures/Studies: Dg Chest 2 View  11/22/2011  *RADIOLOGY REPORT*  Clinical Data: Weakness  CHEST - 2 VIEW  Comparison: 12/22/2010  Findings: Cardiomediastinal silhouette is stable.  No acute infiltrate or pulmonary edema.  Bony thorax is stable.  Again noted ectatic tortuous thoracic aorta.  Atherosclerotic calcifications of thoracic aorta are stable. Stable mild degenerative changes bilateral shoulders.  IMPRESSION: No active disease.  No significant change.   Original Report Authenticated By: Natasha Mead, M.D.    Ct Head Wo Contrast  11/22/2011  *RADIOLOGY REPORT*  Clinical Data: Pain, weakness, weight loss  CT HEAD WITHOUT CONTRAST  Technique:  Contiguous axial images were obtained from the base of  the skull through the vertex without contrast.  Comparison: 12/22/2010  Findings: No evidence of parenchymal hemorrhage or extra-axial fluid collection. No mass lesion, mass effect, or midline shift.  No CT evidence of acute infarction.  Subcortical white matter and periventricular small vessel ischemic changes.  Intracranial atherosclerosis.  Global cortical atrophy, possibly age appropriate.  Secondary ventricular prominence.  Mild mucosal thickening in the right maxillary sinus.  Visualized paranasal sinuses and mastoid air cells are otherwise clear.  No evidence of calvarial fracture.  IMPRESSION: No evidence of acute intracranial abnormality.  Atrophy with small vessel ischemic changes and intracranial atherosclerosis.   Original Report Authenticated By: Charline Bills, M.D.    Mr Brain Wo Contrast  11/22/2011  *RADIOLOGY REPORT*  Clinical Data: Increasing right arm weakness with mental declined.  MRI HEAD WITHOUT CONTRAST  Technique:  Multiplanar, multiecho pulse sequences of the brain and surrounding structures were obtained according to standard protocol without intravenous contrast.  Comparison: CT head 11/22/2011  Findings: The patient had difficulty remaining motionless for the study.  Images are suboptimal.  Small or subtle lesions could be overlooked.  There is no evidence for acute infarction, intracranial hemorrhage, mass lesion, hydrocephalus, or extra-axial fluid. Moderate to severe atrophy is present.  Advanced chronic microvascular ischemic change is present in the periventricular and subcortical white matter.  Dolichoectatic cerebral vasculature.  Both vertebrals contribute to formation of the basilar.  The basilar appears thick-walled in the prepontine region (images 6-8, series 5-7  No definite thrombus or narrowing. Central flow void maintained.  Nonocclusive basilar dissection not excluded.  Formal catheter angiogram could provide additional information.  Flow voids are maintained in  both carotids.  Bilateral cataract extraction.  Normal pituitary.  No cerebellar tonsillar herniation.  Moderate spondylosis. Mild bilateral chronic sinus  disease.  No acute mastoid fluid.  IMPRESSION: Motion degraded images.  Small or subtle lesions could be overlooked.  No acute stroke or bleed.  Advanced atrophy and chronic microvascular ischemic change.  The moderate thick-walled basilar artery.  Nonocclusive dissection not excluded.   Original Report Authenticated By: Elsie Stain, M.D.    Ct Abdomen Pelvis W Contrast  11/16/2011  *RADIOLOGY REPORT*  Clinical Data: Abdominal pain, vomiting  CT ABDOMEN AND PELVIS WITH CONTRAST  Technique:  Multidetector CT imaging of the abdomen and pelvis was performed following the standard protocol during bolus administration of intravenous contrast.  Contrast: 80mL OMNIPAQUE IOHEXOL 300 MG/ML  SOLN  Comparison: CT abdomen pelvis of 09/20/2011  Findings: The lung bases are clear with no change in probable calcified granuloma at the right lung base above hemidiaphragm. Cardiomegaly is stable.  There is atheromatous change throughout the distal thoracic aorta.  The liver enhances with no significant interval change noted.  No ductal dilatation is present.  No calcified gallstones are noted.  The common bile duct and pancreatic duct are slightly prominent but stable, and the pancreas is normal in size with no interval change evident.  The adrenal glands and spleen are unremarkable.  The stomach is not well distended.  The kidneys enhance with no calculus or mass and no hydronephrosis is seen.  On delayed images, the pelvocaliceal systems are unremarkable.  There is fusiform dilatation of the abdominal aorta but no focal aneurysm is seen.  Moderate atheromatous changes noted.  The common iliac arteries are very tortuous.  The uterus is to the left midline and contains dense calcification consistent with uterine fibroids.  No adnexal lesion is seen.  No free fluid is noted  within the pelvis.  The urinary bladder is moderately distended with no abnormality noted.  Multiple rectosigmoid colonic diverticula are present.  The appendix fills with contrast and is unremarkable.  No distention of small bowel loops is seen.  There is degenerative disc disease at the L3-4 level.  IMPRESSION:  1.  No explanation for the patient's pain is seen. 2.  Somewhat prominent pancreatic duct which appears stable when compared to the prior exam. 3.  Fusiform dilatation of the abdominal aorta with diffuse atheromatous change. 4.  Multiple rectosigmoid colonic diverticula.   Original Report Authenticated By: Juline Patch, M.D.       Code Status: Full Family Communication:daughter at bedside Disposition Plan: SNF when medically stable  Subjective: Patient is more alert today. Daughter is at the bedside. Daughter states that patient is also more alert. Patient denies chest pain, shortness of breath, nausea, vomiting, abdominal pain, fevers, chills. Daughter expressed concerns about rectal bleeding from hemorrhoids.  Objective: Filed Vitals:   11/22/11 1527 11/22/11 2138 11/23/11 0247 11/23/11 0435  BP: 160/85 173/91 136/86 144/93  Pulse: 60 63 65 60  Temp:  97.6 F (36.4 C) 97.8 F (36.6 C) 98.4 F (36.9 C)  TempSrc:  Oral Oral Oral  Resp: 20 18 18 18   Weight:   50.984 kg (112 lb 6.4 oz)   SpO2: 96% 100% 95% 96%    Intake/Output Summary (Last 24 hours) at 11/23/11 1132 Last data filed at 11/23/11 0900  Gross per 24 hour  Intake    480 ml  Output    500 ml  Net    -20 ml   Weight change:  Exam:   General:  Pt is alert, follows commands appropriately, not in acute distress  HEENT: No icterus, No thrush,  [place]/AT  Cardiovascular: Regular rate and rhythm, S1/S2, no rubs, no gallops  Respiratory: Clear to auscultation bilaterally, no wheezing, no crackles, no rhonchi  Abdomen: Soft, non tender, non distended, bowel sounds present, no guarding  Extremities: No edema, No  lymphangitis, No petechiae, No rashes, no synovitis  Rectal: Minimal rectal prolapse easily reducible, internal hemorrhoids visible with scant amount of blood; no skin breakdown on the buttock  Data Reviewed: Basic Metabolic Panel:  Lab 11/23/11 7829 11/22/11 1242 11/16/11 1248  NA 143 142 145  K 3.0* 3.2* 3.4*  CL 104 100 103  CO2 30 34* 30  GLUCOSE 180* 203* 216*  BUN 28* 38* 20  CREATININE 1.33* 1.75* 1.05  CALCIUM 9.4 10.4 10.0  MG 1.8 -- --  PHOS -- -- --   Liver Function Tests:  Lab 11/22/11 1242 11/16/11 1248  AST 30 24  ALT 23 21  ALKPHOS 59 84  BILITOT 0.2* 0.2*  PROT 7.0 7.5  ALBUMIN 3.5 3.7    Lab 11/16/11 1247  LIPASE 57  AMYLASE --   No results found for this basename: AMMONIA:5 in the last 168 hours CBC:  Lab 11/23/11 0550 11/22/11 1242 11/16/11 1248  WBC 6.3 6.0 6.7  NEUTROABS -- -- 3.6  HGB 12.2 13.5 13.3  HCT 36.7 39.9 39.3  MCV 89.1 88.3 88.1  PLT 187 212 230   Cardiac Enzymes:  Lab 11/22/11 2154 11/22/11 1726 11/22/11 1242  CKTOTAL -- 374* --  CKMB -- -- --  CKMBINDEX -- -- --  TROPONINI <0.30 <0.30 <0.30   BNP: No components found with this basename: POCBNP:5 CBG:  Lab 11/23/11 1011 11/22/11 2231 11/22/11 1618 11/16/11 1414  GLUCAP 198* 130* 89 162*    No results found for this or any previous visit (from the past 240 hour(s)).   Scheduled Meds:   . sodium chloride   Intravenous STAT  . aspirin EC  81 mg Oral Daily  . enoxaparin (LOVENOX) injection  30 mg Subcutaneous Q24H  . ezetimibe  10 mg Oral Daily  . insulin aspart  0-5 Units Subcutaneous QHS  . insulin aspart  0-9 Units Subcutaneous TID WC  . potassium chloride  10 mEq Intravenous Once  . potassium chloride  20 mEq Oral Once  . simvastatin  20 mg Oral QPM  . sodium chloride  500 mL Intravenous Once  . sodium chloride  500 mL Intravenous Once  . DISCONTD: aspirin EC  81 mg Oral Daily  . DISCONTD: potassium chloride  20 mEq Oral Once   Continuous Infusions:   .  sodium chloride 75 mL/hr (11/22/11 2200)     [name] Vogelgesang, DO  [facility] Pager [phone]  If 7PM-7AM, please contact night-coverage [email or web address] Password [email or web address] 11/23/2011, 11:32 AM   LOS: 1 day

## 2011-11-23 NOTE — Progress Notes (Signed)
Patient daughter [name] Person notified of transfer to room 6715. me

## 2011-11-23 NOTE — Clinical Social Work Placement (Addendum)
    Clinical Social Work Department CLINICAL SOCIAL WORK PLACEMENT NOTE 11/23/2011  Patient:  [name], [name]  Account Number:  [id number] Admit date:  11/22/2011  Clinical Social Worker:  Genelle Bal, LCSW  Date/time:  11/23/2011 05:39 AM  Clinical Social Work is seeking post-discharge placement for this patient at the following level of care:   SKILLED NURSING   (*CSW will update this form in Epic as items are completed)   11/23/2011  Patient/family provided with [facility] Department of Clinical Social Work's list of facilities offering this level of care within the geographic area requested by the patient (or if unable, by the patient's family).  11/23/2011  Patient/family informed of their freedom to choose among providers that offer the needed level of care, that participate in Medicare, Medicaid or managed care program needed by the patient, have an available bed and are willing to accept the patient.    Patient/family informed of [facility]' ownership interest in [facility], as well as of the fact that they are under no obligation to receive care at this facility.  PASARR submitted to EDS on 11/23/11 PASARR number received from EDS on 11/23/11  FL2 transmitted to all facilities in geographic area requested by pt/family on 11/23/11  FL2 transmitted to all facilities within larger geographic area on   Patient informed that his/her managed care company has contracts with or will negotiate with  certain facilities, including the following:     Patient/family informed of bed offers received: 11/26/11  Patient chooses bed at [facility] Physician recommends and patient chooses bed at    Patient to be transferred to [place] on 11/26/11  Patient to be transferred to facility by ambulance  The following physician request were entered in Epic:   Additional Comments: 11/26/11 Alvina Chou, LCSW): Daughter-[name] at bedside and is aware of  discharge.

## 2011-11-23 NOTE — Clinical Social Work Psychosocial (Signed)
     Clinical Social Work Department BRIEF PSYCHOSOCIAL ASSESSMENT 11/23/2011  Patient:  [name], [name]     Account Number:  [id number]     Admit date:  11/22/2011  Clinical Social Worker:  Delmer Islam  Date/Time:  11/23/2011 05:31 AM  Referred by:  Physician  Date Referred:  11/23/2011 Referred for  SNF Placement   Other Referral:   Interview type:  Family Other interview type:    PSYCHOSOCIAL DATA Living Status:  ALONE Admitted from facility:   Level of care:   Primary support name:  [name] Person Primary support relationship to patient:  CHILD, ADULT Degree of support available:   Strong support - daughter lives very close by and visit at least twice daily. [name] phone number [phone] (h) and [phone] (c).    CURRENT CONCERNS Current Concerns  Post-Acute Placement   Other Concerns:    SOCIAL WORK ASSESSMENT / PLAN CSW talked with daghter by phone regarding MD's consult for SNF placement. Daughter reported that MD talked with them and she and patient are in agreement with [facility] rehab. CSW explained SNF search process and asked about the best way to get skilled facility list to her and daughter requested it be faxed to her.   Assessment/plan status:   Other assessment/ plan:   Information/referral to community resources:   Daughter faxed [facility] skilled facility list.    PATIENTS/FAMILYS RESPONSE TO PLAN OF CARE: Dughter was open and receptive to talking with CSW. Daughter advised that she will be contacted with bed offers.

## 2011-11-23 NOTE — Progress Notes (Signed)
*  PRELIMINARY RESULTS* Vascular Ultrasound Carotid Duplex (Doppler) has been completed.  No obvious evidence of hemodynamically significant internal carotid artery stenosis bilaterally. Internal carotid arteries are moderately tortuous. Vertebral arteries are patent with antegrade flow.  11/23/2011 10:24 AM Gertie Fey, RDMS, RDCS

## 2011-11-23 NOTE — Progress Notes (Signed)
Report called to Nancy Marus RN patient to  be transferred [id number] telemetry bed . me

## 2011-11-24 ENCOUNTER — Encounter (HOSPITAL_COMMUNITY): Payer: Self-pay

## 2011-11-24 LAB — LIPID PANEL
Cholesterol: 134 mg/dL (ref 0–200)
HDL: 54 mg/dL (ref >39)
Total CHOL/HDL Ratio: 2.5 RATIO
Triglycerides: 125 mg/dL (ref <150)
VLDL: 25 mg/dL (ref 0–40)

## 2011-11-24 LAB — BASIC METABOLIC PANEL
BUN: 17 mg/dL (ref 6–23)
Calcium: 8.8 mg/dL (ref 8.4–10.5)
GFR calc Af Amer: 45 mL/min — ABNORMAL LOW (ref >90)
GFR calc non Af Amer: 39 mL/min — ABNORMAL LOW (ref >90)
Potassium: 2.9 mEq/L — ABNORMAL LOW (ref 3.5–5.1)

## 2011-11-24 LAB — GLUCOSE, CAPILLARY: Glucose-Capillary: 200 mg/dL — ABNORMAL HIGH (ref 70–99)

## 2011-11-24 MED ORDER — DOCUSATE SODIUM 100 MG PO CAPS
100.0000 mg | ORAL_CAPSULE | Freq: Two times a day (BID) | ORAL | Status: DC
  Administered 2011-11-24 – 2011-11-26 (×4): 100 mg via ORAL
  Filled 2011-11-24 (×5): qty 1

## 2011-11-24 MED ORDER — AMLODIPINE BESYLATE 10 MG PO TABS
10.0000 mg | ORAL_TABLET | Freq: Every day | ORAL | Status: DC
  Administered 2011-11-25 – 2011-11-26 (×2): 10 mg via ORAL
  Filled 2011-11-24 (×2): qty 1

## 2011-11-24 MED ORDER — PNEUMOCOCCAL VAC POLYVALENT 25 MCG/0.5ML IJ INJ
0.5000 mL | INJECTION | INTRAMUSCULAR | Status: AC
  Administered 2011-11-25: 0.5 mL via INTRAMUSCULAR
  Filled 2011-11-24: qty 0.5

## 2011-11-24 MED ORDER — AMLODIPINE BESYLATE 5 MG PO TABS
5.0000 mg | ORAL_TABLET | Freq: Once | ORAL | Status: AC
  Administered 2011-11-24: 5 mg via ORAL
  Filled 2011-11-24: qty 1

## 2011-11-24 MED ORDER — SODIUM CHLORIDE 0.45 % IV SOLN
INTRAVENOUS | Status: DC
  Administered 2011-11-24 – 2011-11-26 (×4): via INTRAVENOUS
  Filled 2011-11-24 (×6): qty 1000

## 2011-11-24 MED ORDER — POTASSIUM CHLORIDE 20 MEQ/15ML (10%) PO LIQD
40.0000 meq | Freq: Once | ORAL | Status: AC
  Administered 2011-11-24: 40 meq via ORAL
  Filled 2011-11-24: qty 30

## 2011-11-24 NOTE — Progress Notes (Signed)
[facility] PROGRESS NOTE  [name] XBJ:[id number] DOB: 02/12/1918 DOA: 11/22/2011 PCP: [name], MD  Assessment/Plan: Acute kidney injury  -A combination of volume depletion, ARB, Bumex at home, and recent CT contrast approximately 6 days ago.  -Switch normal saline to half-normal due to hypernatremia Generalized weakness/right arm weakness  -UA is negative  -MRI brain shows advanced atrophy, no acute stroke  -Carotid ultrasound negative  -Likely multifactorial including deconditioning Memory loss  -Suspect underlying dementia  -B12, TSH, RPR negative Hypercalcemia  -Likely due to the patient's acute kidney injury and dehydration  -Improved with intravenous fluids  Dysphagia  -Appreciate Dr. Jeani Hawking, gastroenterology, plans noted for outpatient manometry -Dysphagia 2 diet per speech therapy recommendation Diabetes mellitus type 2  -Discontinue glyburide and metformin  -NovoLog sliding scale for now  -Hemoglobin A1c 9.1, sugar remarkably controlled during the hospitalization -May need to pre-meal insulin as her by mouth intake increases Hyperlipidemia  -Continue Zocor for now  Hypertension  -Hold losartan and Bumex for now  -Increase amlodipine to 10 mg daily Deconditioning  -PT/OT eval  -Plan for skilled nursing facility; consult social work  Rectal prolapse  -Prolapse is very mild and is reducible  -Patient has internal hemorrhoids  -Prescribed Preparation H  -Start Colace -Discontinue aspirin if bleeding worsens Hypokalemia -Replace potassium      Family Communication:   Pt at beside Disposition Plan:   SNF when medically stable    Procedures/Studies: Dg Chest 2 View  11/22/2011  *RADIOLOGY REPORT*  Clinical Data: Weakness  CHEST - 2 VIEW  Comparison: 12/22/2010  Findings: Cardiomediastinal silhouette is stable.  No acute infiltrate or pulmonary edema.  Bony thorax is stable.  Again noted ectatic tortuous thoracic aorta.   Atherosclerotic calcifications of thoracic aorta are stable. Stable mild degenerative changes bilateral shoulders.  IMPRESSION: No active disease.  No significant change.   Original Report Authenticated By: Natasha Mead, M.D.    Ct Head Wo Contrast  11/22/2011  *RADIOLOGY REPORT*  Clinical Data: Pain, weakness, weight loss  CT HEAD WITHOUT CONTRAST  Technique:  Contiguous axial images were obtained from the base of the skull through the vertex without contrast.  Comparison: 12/22/2010  Findings: No evidence of parenchymal hemorrhage or extra-axial fluid collection. No mass lesion, mass effect, or midline shift.  No CT evidence of acute infarction.  Subcortical white matter and periventricular small vessel ischemic changes.  Intracranial atherosclerosis.  Global cortical atrophy, possibly age appropriate.  Secondary ventricular prominence.  Mild mucosal thickening in the right maxillary sinus.  Visualized paranasal sinuses and mastoid air cells are otherwise clear.  No evidence of calvarial fracture.  IMPRESSION: No evidence of acute intracranial abnormality.  Atrophy with small vessel ischemic changes and intracranial atherosclerosis.   Original Report Authenticated By: Charline Bills, M.D.    Mr Brain Wo Contrast  11/22/2011  *RADIOLOGY REPORT*  Clinical Data: Increasing right arm weakness with mental declined.  MRI HEAD WITHOUT CONTRAST  Technique:  Multiplanar, multiecho pulse sequences of the brain and surrounding structures were obtained according to standard protocol without intravenous contrast.  Comparison: CT head 11/22/2011  Findings: The patient had difficulty remaining motionless for the study.  Images are suboptimal.  Small or subtle lesions could be overlooked.  There is no evidence for acute infarction, intracranial hemorrhage, mass lesion, hydrocephalus, or extra-axial fluid. Moderate to severe atrophy is present.  Advanced chronic microvascular ischemic change is present in the periventricular  and subcortical white matter.  Dolichoectatic cerebral vasculature.  Both vertebrals contribute to  formation of the basilar.  The basilar appears thick-walled in the prepontine region (images 6-8, series 5-7  No definite thrombus or narrowing. Central flow void maintained.  Nonocclusive basilar dissection not excluded.  Formal catheter angiogram could provide additional information.  Flow voids are maintained in both carotids.  Bilateral cataract extraction.  Normal pituitary.  No cerebellar tonsillar herniation.  Moderate spondylosis. Mild bilateral chronic sinus disease.  No acute mastoid fluid.  IMPRESSION: Motion degraded images.  Small or subtle lesions could be overlooked.  No acute stroke or bleed.  Advanced atrophy and chronic microvascular ischemic change.  The moderate thick-walled basilar artery.  Nonocclusive dissection not excluded.   Original Report Authenticated By: Elsie Stain, M.D.    Ct Abdomen Pelvis W Contrast  11/16/2011  *RADIOLOGY REPORT*  Clinical Data: Abdominal pain, vomiting  CT ABDOMEN AND PELVIS WITH CONTRAST  Technique:  Multidetector CT imaging of the abdomen and pelvis was performed following the standard protocol during bolus administration of intravenous contrast.  Contrast: 80mL OMNIPAQUE IOHEXOL 300 MG/ML  SOLN  Comparison: CT abdomen pelvis of 09/20/2011  Findings: The lung bases are clear with no change in probable calcified granuloma at the right lung base above hemidiaphragm. Cardiomegaly is stable.  There is atheromatous change throughout the distal thoracic aorta.  The liver enhances with no significant interval change noted.  No ductal dilatation is present.  No calcified gallstones are noted.  The common bile duct and pancreatic duct are slightly prominent but stable, and the pancreas is normal in size with no interval change evident.  The adrenal glands and spleen are unremarkable.  The stomach is not well distended.  The kidneys enhance with no calculus or mass and  no hydronephrosis is seen.  On delayed images, the pelvocaliceal systems are unremarkable.  There is fusiform dilatation of the abdominal aorta but no focal aneurysm is seen.  Moderate atheromatous changes noted.  The common iliac arteries are very tortuous.  The uterus is to the left midline and contains dense calcification consistent with uterine fibroids.  No adnexal lesion is seen.  No free fluid is noted within the pelvis.  The urinary bladder is moderately distended with no abnormality noted.  Multiple rectosigmoid colonic diverticula are present.  The appendix fills with contrast and is unremarkable.  No distention of small bowel loops is seen.  There is degenerative disc disease at the L3-4 level.  IMPRESSION:  1.  No explanation for the patient's pain is seen. 2.  Somewhat prominent pancreatic duct which appears stable when compared to the prior exam. 3.  Fusiform dilatation of the abdominal aorta with diffuse atheromatous change. 4.  Multiple rectosigmoid colonic diverticula.   Original Report Authenticated By: Juline Patch, M.D.        Subjective: Patient is feeling stronger. She denies any chest pain, shortness of breath, vomiting, diarrhea, abdominal pain, dizziness, headache, fevers, chills  Objective: Filed Vitals:   11/23/11 2000 11/24/11 0920 11/24/11 1349 11/24/11 1500  BP: 150/68 167/98 152/91   Pulse: 62 87 63   Temp: 97.7 F (36.5 C) 98 F (36.7 C) 98.4 F (36.9 C)   TempSrc: Oral Oral Oral   Resp: 18 20 18    Height:    5\' 4"  (1.626 m)  Weight: 50.984 kg (112 lb 6.4 oz)     SpO2: 95% 95% 99%     Intake/Output Summary (Last 24 hours) at 11/24/11 1552 Last data filed at 11/24/11 1153  Gross per 24 hour  Intake 486.25  ml  Output      0 ml  Net 486.25 ml   Weight change: 0 kg (0 lb) Exam:   General:  Pt is alert, follows commands appropriately, not in acute distress  HEENT: No icterus, No thrush, No neck mass, Bruni/AT  Cardiovascular: RRR, S1/S2, no rubs, no  gallops  Respiratory: Clear to auscultation bilaterally, no wheezing, no crackles, no rhonchi  Abdomen: Soft, non tender, non distended, bowel sounds present, no guarding  Extremities: No edema, No lymphangitis, No petechiae, No rashes, no synovitis  Data Reviewed: Basic Metabolic Panel:  Lab 11/24/11 6578 11/23/11 0550 11/22/11 1242  NA 147* 143 142  K 2.9* 3.0* 3.2*  CL 109 104 100  CO2 28 30 34*  GLUCOSE 145* 180* 203*  BUN 17 28* 38*  CREATININE 1.19* 1.33* 1.75*  CALCIUM 8.8 9.4 10.4  MG -- 1.8 --  PHOS -- -- --   Liver Function Tests:  Lab 11/22/11 1242  AST 30  ALT 23  ALKPHOS 59  BILITOT 0.2*  PROT 7.0  ALBUMIN 3.5   No results found for this basename: LIPASE:5,AMYLASE:5 in the last 168 hours No results found for this basename: AMMONIA:5 in the last 168 hours CBC:  Lab 11/23/11 0550 11/22/11 1242  WBC 6.3 6.0  NEUTROABS -- --  HGB 12.2 13.5  HCT 36.7 39.9  MCV 89.1 88.3  PLT 187 212   Cardiac Enzymes:  Lab 11/24/11 0458 11/22/11 2154 11/22/11 1726 11/22/11 1242  CKTOTAL 293* -- 374* --  CKMB -- -- -- --  CKMBINDEX -- -- -- --  TROPONINI -- <0.30 <0.30 <0.30   BNP: No components found with this basename: POCBNP:5 CBG:  Lab 11/24/11 1132 11/24/11 0742 11/23/11 2148 11/23/11 1708 11/23/11 1110  GLUCAP 171* 139* 128* 127* 188*    Recent Results (from the past 240 hour(s))  URINE CULTURE     Status: Normal (Preliminary result)   Collection Time   11/22/11  4:20 PM      Component Value Range Status Comment   Specimen Description URINE, CATHETERIZED   Final    Special Requests [facility] ADD [id number]   Final    Culture  Setup Time 11/23/2011 11:09   Final    Colony Count >=100,000 COLONIES/ML   Final    Culture ESCHERICHIA COLI   Final    Report Status PENDING   Incomplete      Scheduled Meds:   . amLODipine  10 mg Oral Daily  . amLODipine  5 mg Oral Once  . aspirin EC  81 mg Oral Daily  . docusate sodium  100 mg Oral BID  . enoxaparin  (LOVENOX) injection  30 mg Subcutaneous Q24H  . ezetimibe  10 mg Oral Daily  . hydrocortisone cream   Topical BID  . insulin aspart  0-5 Units Subcutaneous QHS  . insulin aspart  0-9 Units Subcutaneous TID WC  . simvastatin  20 mg Oral QPM  . DISCONTD: amLODipine  5 mg Oral Daily   Continuous Infusions:   . DISCONTD: sodium chloride 75 mL/hr at 11/24/11 1153     Nixxon Faria, DO  [facility] Pager [phone]  If 7PM-7AM, please contact night-coverage [email or web address] Password [email or web address] 11/24/2011, 3:52 PM   LOS: 2 days

## 2011-11-24 NOTE — Evaluation (Signed)
Physical Therapy Evaluation Patient Details Name: [name] MRN: [id number] DOB: June 07, 1917 Today's Date: 11/24/2011 Time: 3086-5784 PT Time Calculation (min): 25 min  PT Assessment / Plan / Recommendation Clinical Impression  Pt. was admitted on 9/19 with with progressive weakness, difficulty with mobility, increased memor loss, acute kidney injury.  Presents to PT with decreased mobility and gait from usual baseline.  Will benefit from acute PT to address these and below issues.  Pt. is for SNF placement and will benefit from 5X/wk. therapies at that level.    PT Assessment  Patient needs continued PT services    Follow Up Recommendations  Skilled nursing facility    Barriers to Discharge Decreased caregiver support      Equipment Recommendations  Defer to next venue    Recommendations for Other Services     Frequency Min 3X/week    Precautions / Restrictions Precautions Precautions: Fall Restrictions Weight Bearing Restrictions: No   Pertinent Vitals/Pain No pain, no distress      Mobility  Bed Mobility Bed Mobility: Not assessed (pt. seated at EOB on arrival) Transfers Transfers: Sit to Stand;Stand to Sit Sit to Stand: 4: Min assist;With upper extremity assist;From bed Stand to Sit: 4: Min assist;With upper extremity assist;To bed Details for Transfer Assistance: cues for safe techjnique, hand placement and encouragement for task completion Ambulation/Gait Ambulation/Gait Assistance: 3: Mod assist Ambulation Distance (Feet): 12 Feet Assistive device: 1 person hand held assist Ambulation/Gait Assistance Details: pt. needed assist for stability and balance and to manage IV pole Gait Pattern: Step-through pattern;Decreased step length - right;Decreased step length - left;Trunk flexed Gait velocity: slowed Stairs: No    Exercises     PT Diagnosis: Difficulty walking;Generalized weakness  PT Problem List: Decreased strength;Decreased activity  tolerance;Decreased balance;Decreased mobility;Decreased knowledge of use of DME;Decreased safety awareness;Decreased knowledge of precautions PT Treatment Interventions: DME instruction;Gait training;Functional mobility training;Therapeutic activities;Therapeutic exercise;Balance training;Patient/family education   PT Goals Acute Rehab PT Goals PT Goal Formulation: Patient unable to participate in goal setting Time For Goal Achievement: 12/08/11 Potential to Achieve Goals: Fair Pt will go Supine/Side to Sit: with supervision PT Goal: Supine/Side to Sit - Progress: Goal set today Pt will go Sit to Supine/Side: with supervision PT Goal: Sit to Supine/Side - Progress: Goal set today Pt will go Sit to Stand: with supervision PT Goal: Sit to Stand - Progress: Goal set today Pt will go Stand to Sit: with supervision PT Goal: Stand to Sit - Progress: Goal set today Pt will Transfer Bed to Chair/Chair to Bed: with supervision PT Transfer Goal: Bed to Chair/Chair to Bed - Progress: Goal set today Pt will Ambulate: 51 - 150 feet;with min assist;with least restrictive assistive device PT Goal: Ambulate - Progress: Goal set today  Visit Information  Last PT Received On: 11/24/11 Assistance Needed: +1    Subjective Data  Subjective: "I am not myself today at all" Patient Stated Goal: Pt. unable to state goals due to mental state   Prior Functioning  Home Living Lives With: Alone Available Help at Discharge: Skilled Nursing Facility Home Adaptive Equipment: Walker - rolling Prior Function Level of Independence: Independent with assistive device(s) Driving: No Communication Communication: No difficulties;Other (comment) (Pt. from [place] with thick accent)    Cognition  Overall Cognitive Status: History of cognitive impairments - further impaired (likely underlying dememtia with worsening memory loss) Area of Impairment: Memory;Safety/judgement;Awareness of deficits;Problem  solving Arousal/Alertness: Awake/alert Orientation Level: Disoriented to;Time;Situation Behavior During Session: Restless Memory:  (unable to recall daughter's phone #)  Safety/Judgement: Decreased awareness of safety precautions;Decreased safety judgement for tasks assessed    Extremity/Trunk Assessment Right Upper Extremity Assessment RUE ROM/Strength/Tone: WFL for tasks assessed Left Upper Extremity Assessment LUE ROM/Strength/Tone: WFL for tasks assessed Right Lower Extremity Assessment RLE ROM/Strength/Tone: Within functional levels RLE Sensation: WFL - Light Touch Left Lower Extremity Assessment LLE ROM/Strength/Tone: Within functional levels LLE Sensation: WFL - Light Touch Trunk Assessment Trunk Assessment: Normal   Balance    End of Session PT - End of Session Equipment Utilized During Treatment: Gait belt Activity Tolerance: Patient limited by fatigue;Other (comment) (limited by restlessness) Patient left: in bed;with bed alarm set;with call bell/phone within reach Nurse Communication: Mobility status  GP     Ferman Hamming 11/24/2011, 9:21 AM Weldon Picking PT Acute Rehab Services [phone] [place] [phone]

## 2011-11-25 DIAGNOSIS — I10 Essential (primary) hypertension: Secondary | ICD-10-CM

## 2011-11-25 LAB — URINE CULTURE: Colony Count: >=100000

## 2011-11-25 LAB — BASIC METABOLIC PANEL
BUN: 14 mg/dL (ref 6–23)
CO2: 26 mEq/L (ref 19–32)
Calcium: 8.6 mg/dL (ref 8.4–10.5)
Chloride: 107 mEq/L (ref 96–112)
Creatinine, Ser: 1.04 mg/dL (ref 0.50–1.10)
Glucose, Bld: 159 mg/dL — ABNORMAL HIGH (ref 70–99)

## 2011-11-25 LAB — CK: Total CK: 333 U/L — ABNORMAL HIGH (ref 7–177)

## 2011-11-25 LAB — GLUCOSE, CAPILLARY
Glucose-Capillary: 174 mg/dL — ABNORMAL HIGH (ref 70–99)
Glucose-Capillary: 136 mg/dL — ABNORMAL HIGH (ref 70–99)

## 2011-11-25 MED ORDER — INSULIN ASPART 100 UNIT/ML ~~LOC~~ SOLN
0.0000 [IU] | Freq: Three times a day (TID) | SUBCUTANEOUS | Status: DC
  Administered 2011-11-25 – 2011-11-26 (×3): 2 [IU] via SUBCUTANEOUS

## 2011-11-25 MED ORDER — INSULIN ASPART 100 UNIT/ML ~~LOC~~ SOLN
0.0000 [IU] | Freq: Every day | SUBCUTANEOUS | Status: DC
Start: 2011-11-25 — End: 2011-11-25

## 2011-11-25 NOTE — Progress Notes (Signed)
[facility] PROGRESS NOTE  Jovita Ibarra ZHY:[id number] DOB: [birth year] DOA: 11/22/2011 PCP: [name], MD  Assessment/Plan: Acute kidney injury  -A combination of volume depletion, ARB, Bumex at home, and recent CT contrast approximately 5 days prior to admission.  Generalized weakness/right arm weakness  -gradually improving -UA is negative  -MRI brain shows advanced atrophy, no acute stroke  -Carotid ultrasound negative  -Likely multifactorial including deconditioning  Memory loss  -stable/improving -Suspect underlying dementia  -B12, TSH, RPR negative  Hypercalcemia  -Likely due to the patient's acute kidney injury and dehydration  -Improved with intravenous fluids  Dysphagia  -Appreciate Dr. Jeani Hawking, gastroenterology, plans noted for outpatient manometry  -Dysphagia 2 diet per speech therapy recommendation  Diabetes mellitus type 2  -Discontinue glyburide and metformin  - adjust NovoLog sliding scale to more aggressive scale -Hemoglobin A1c 9.1, sugar remarkably controlled during the hospitalization  -May need to pre-meal insulin as her by mouth intake increases  Hyperlipidemia  -Continue Zocor for now  Hypertension  -Hold losartan and Bumex for now  -continue amlodipine to 10 mg daily  Deconditioning  -PT/OT eval  -Plan for skilled nursing facility; consult social work  Rectal prolapse  -Prolapse is very mild and is reducible  -Patient has internal hemorrhoids  -Prescribed Preparation H  -Start Colace  -Discontinue aspirin if bleeding worsens  Elevated CPK -discontinue zocor, zetia     Family Communication:   Updated daughter Disposition Plan:   Home when medically stable    Procedures/Studies: Dg Chest 2 View  11/22/2011  *RADIOLOGY REPORT*  Clinical Data: Weakness  CHEST - 2 VIEW  Comparison: 12/22/2010  Findings: Cardiomediastinal silhouette is stable.  No acute infiltrate or pulmonary edema.  Bony thorax is stable.  Again noted  ectatic tortuous thoracic aorta.  Atherosclerotic calcifications of thoracic aorta are stable. Stable mild degenerative changes bilateral shoulders.  IMPRESSION: No active disease.  No significant change.   Original Report Authenticated By: Natasha Mead, M.D.    Ct Head Wo Contrast  11/22/2011  *RADIOLOGY REPORT*  Clinical Data: Pain, weakness, weight loss  CT HEAD WITHOUT CONTRAST  Technique:  Contiguous axial images were obtained from the base of the skull through the vertex without contrast.  Comparison: 12/22/2010  Findings: No evidence of parenchymal hemorrhage or extra-axial fluid collection. No mass lesion, mass effect, or midline shift.  No CT evidence of acute infarction.  Subcortical white matter and periventricular small vessel ischemic changes.  Intracranial atherosclerosis.  Global cortical atrophy, possibly age appropriate.  Secondary ventricular prominence.  Mild mucosal thickening in the right maxillary sinus.  Visualized paranasal sinuses and mastoid air cells are otherwise clear.  No evidence of calvarial fracture.  IMPRESSION: No evidence of acute intracranial abnormality.  Atrophy with small vessel ischemic changes and intracranial atherosclerosis.   Original Report Authenticated By: Charline Bills, M.D.    Mr Brain Wo Contrast  11/22/2011  *RADIOLOGY REPORT*  Clinical Data: Increasing right arm weakness with mental declined.  MRI HEAD WITHOUT CONTRAST  Technique:  Multiplanar, multiecho pulse sequences of the brain and surrounding structures were obtained according to standard protocol without intravenous contrast.  Comparison: CT head 11/22/2011  Findings: The patient had difficulty remaining motionless for the study.  Images are suboptimal.  Small or subtle lesions could be overlooked.  There is no evidence for acute infarction, intracranial hemorrhage, mass lesion, hydrocephalus, or extra-axial fluid. Moderate to severe atrophy is present.  Advanced chronic microvascular ischemic change  is present in the periventricular and subcortical white matter.  Dolichoectatic cerebral vasculature.  Both vertebrals contribute to formation of the basilar.  The basilar appears thick-walled in the prepontine region (images 6-8, series 5-7  No definite thrombus or narrowing. Central flow void maintained.  Nonocclusive basilar dissection not excluded.  Formal catheter angiogram could provide additional information.  Flow voids are maintained in both carotids.  Bilateral cataract extraction.  Normal pituitary.  No cerebellar tonsillar herniation.  Moderate spondylosis. Mild bilateral chronic sinus disease.  No acute mastoid fluid.  IMPRESSION: Motion degraded images.  Small or subtle lesions could be overlooked.  No acute stroke or bleed.  Advanced atrophy and chronic microvascular ischemic change.  The moderate thick-walled basilar artery.  Nonocclusive dissection not excluded.   Original Report Authenticated By: Elsie Stain, M.D.    Ct Abdomen Pelvis W Contrast  11/16/2011  *RADIOLOGY REPORT*  Clinical Data: Abdominal pain, vomiting  CT ABDOMEN AND PELVIS WITH CONTRAST  Technique:  Multidetector CT imaging of the abdomen and pelvis was performed following the standard protocol during bolus administration of intravenous contrast.  Contrast: 80mL OMNIPAQUE IOHEXOL 300 MG/ML  SOLN  Comparison: CT abdomen pelvis of 09/20/2011  Findings: The lung bases are clear with no change in probable calcified granuloma at the right lung base above hemidiaphragm. Cardiomegaly is stable.  There is atheromatous change throughout the distal thoracic aorta.  The liver enhances with no significant interval change noted.  No ductal dilatation is present.  No calcified gallstones are noted.  The common bile duct and pancreatic duct are slightly prominent but stable, and the pancreas is normal in size with no interval change evident.  The adrenal glands and spleen are unremarkable.  The stomach is not well distended.  The kidneys  enhance with no calculus or mass and no hydronephrosis is seen.  On delayed images, the pelvocaliceal systems are unremarkable.  There is fusiform dilatation of the abdominal aorta but no focal aneurysm is seen.  Moderate atheromatous changes noted.  The common iliac arteries are very tortuous.  The uterus is to the left midline and contains dense calcification consistent with uterine fibroids.  No adnexal lesion is seen.  No free fluid is noted within the pelvis.  The urinary bladder is moderately distended with no abnormality noted.  Multiple rectosigmoid colonic diverticula are present.  The appendix fills with contrast and is unremarkable.  No distention of small bowel loops is seen.  There is degenerative disc disease at the L3-4 level.  IMPRESSION:  1.  No explanation for the patient's pain is seen. 2.  Somewhat prominent pancreatic duct which appears stable when compared to the prior exam. 3.  Fusiform dilatation of the abdominal aorta with diffuse atheromatous change. 4.  Multiple rectosigmoid colonic diverticula.   Original Report Authenticated By: Juline Patch, M.D.          Subjective: Patient feeling better.  Denies, f/c, cp, sob, n/v/d, abdominal pain, HA, dizziness.  Objective: Filed Vitals:   11/24/11 1708 11/24/11 2000 11/25/11 0404 11/25/11 0950  BP: 152/81 151/67 179/91 115/76  Pulse: 65 69 67 64  Temp: 98.2 F (36.8 C) 97.7 F (36.5 C) 98.3 F (36.8 C) 98.6 F (37 C)  TempSrc: Oral Oral Oral Oral  Resp: 20 18 19 18   Height:  5\' 4"  (1.626 m)    Weight:  52.118 kg (114 lb 14.4 oz)    SpO2: 99% 96% 98% 100%    Intake/Output Summary (Last 24 hours) at 11/25/11 1539 Last data filed at 11/25/11 0900  Gross  per 24 hour  Intake   1280 ml  Output    300 ml  Net    980 ml   Weight change: 1.134 kg (2 lb 8 oz) Exam:   General:  Pt is alert, follows commands appropriately, not in acute distress  HEENT: No icterus, No thrush, No neck mass, [place]/AT  Cardiovascular: RRR,  S1/S2, no rubs, no gallops  Respiratory: Clear to auscultation bilaterally, no wheezing, no crackles, no rhonchi  Abdomen: Soft/+BS, non tender, non distended, no guarding  Extremities: No edema, No lymphangitis, No petechiae, No rashes, no synovitis  Data Reviewed: Basic Metabolic Panel:  Lab 11/25/11 4098 11/24/11 0458 11/23/11 0550 11/22/11 1242  NA 142 147* 143 142  K 3.5 2.9* 3.0* 3.2*  CL 107 109 104 100  CO2 26 28 30  34*  GLUCOSE 159* 145* 180* 203*  BUN 14 17 28* 38*  CREATININE 1.04 1.19* 1.33* 1.75*  CALCIUM 8.6 8.8 9.4 10.4  MG 1.8 -- 1.8 --  PHOS -- -- -- --   Liver Function Tests:  Lab 11/22/11 1242  AST 30  ALT 23  ALKPHOS 59  BILITOT 0.2*  PROT 7.0  ALBUMIN 3.5   No results found for this basename: LIPASE:5,AMYLASE:5 in the last 168 hours No results found for this basename: AMMONIA:5 in the last 168 hours CBC:  Lab 11/23/11 0550 11/22/11 1242  WBC 6.3 6.0  NEUTROABS -- --  HGB 12.2 13.5  HCT 36.7 39.9  MCV 89.1 88.3  PLT 187 212   Cardiac Enzymes:  Lab 11/25/11 1345 11/25/11 0555 11/24/11 0458 11/22/11 2154 11/22/11 1726 11/22/11 1242  CKTOTAL 333* 419* 293* -- 374* --  CKMB -- -- -- -- -- --  CKMBINDEX -- -- -- -- -- --  TROPONINI -- -- -- <0.30 <0.30 <0.30   BNP: No components found with this basename: POCBNP:5 CBG:  Lab 11/25/11 1132 11/25/11 0736 11/24/11 2134 11/24/11 1635 11/24/11 1132  GLUCAP 174* 166* 200* 131* 171*    Recent Results (from the past 240 hour(s))  URINE CULTURE     Status: Normal   Collection Time   11/22/11  4:20 PM      Component Value Range Status Comment   Specimen Description URINE, CATHETERIZED   Final    Special Requests [facility] ADD [id number]   Final    Culture  Setup Time 11/23/2011 11:09   Final    Colony Count >=100,000 COLONIES/ML   Final    Culture ESCHERICHIA COLI   Final    Report Status 11/25/2011 FINAL   Final    Organism ID, Bacteria ESCHERICHIA COLI   Final      Scheduled Meds:   .  amLODipine  10 mg Oral Daily  . aspirin EC  81 mg Oral Daily  . docusate sodium  100 mg Oral BID  . enoxaparin (LOVENOX) injection  30 mg Subcutaneous Q24H  . hydrocortisone cream   Topical BID  . insulin aspart  0-15 Units Subcutaneous TID WC  . insulin aspart  0-5 Units Subcutaneous QHS  . pneumococcal 23 valent vaccine  0.5 mL Intramuscular Tomorrow-1000  . potassium chloride  40 mEq Oral Once  . DISCONTD: ezetimibe  10 mg Oral Daily  . DISCONTD: insulin aspart  0-5 Units Subcutaneous QHS  . DISCONTD: insulin aspart  0-9 Units Subcutaneous TID WC  . DISCONTD: simvastatin  20 mg Oral QPM   Continuous Infusions:   . sodium chloride 0.45 % 1,000 mL with potassium chloride 20  mEq infusion 75 mL/hr at 11/25/11 0906     Maika Mcelveen, DO  [facility] Pager [phone]  If 7PM-7AM, please contact night-coverage [email or web address] Password [facility] 11/25/2011, 3:39 PM   LOS: 3 days

## 2011-11-26 LAB — GLUCOSE, CAPILLARY: Glucose-Capillary: 149 mg/dL — ABNORMAL HIGH (ref 70–99)

## 2011-11-26 LAB — BASIC METABOLIC PANEL
BUN: 10 mg/dL (ref 6–23)
Calcium: 9 mg/dL (ref 8.4–10.5)
GFR calc Af Amer: 60 mL/min — ABNORMAL LOW (ref >90)
GFR calc non Af Amer: 51 mL/min — ABNORMAL LOW (ref >90)
Glucose, Bld: 152 mg/dL — ABNORMAL HIGH (ref 70–99)
Potassium: 3.8 mEq/L (ref 3.5–5.1)
Sodium: 142 mEq/L (ref 135–145)

## 2011-11-26 LAB — FOLATE RBC: RBC Folate: 1931 ng/mL — ABNORMAL HIGH (ref >=366)

## 2011-11-26 MED ORDER — GLUCERNA SHAKE PO LIQD
237.0000 mL | ORAL | Status: DC
  Administered 2011-11-26: 237 mL via ORAL

## 2011-11-26 MED ORDER — DSS 100 MG PO CAPS: 100.0000 mg | ORAL_CAPSULE | Freq: Two times a day (BID) | ORAL | Status: DC

## 2011-11-26 MED ORDER — ASPIRIN 81 MG PO TBEC: 81.0000 mg | DELAYED_RELEASE_TABLET | Freq: Every day | ORAL | Status: DC

## 2011-11-26 MED ORDER — AMLODIPINE BESYLATE 10 MG PO TABS: 10.0000 mg | ORAL_TABLET | Freq: Every day | ORAL | Status: DC

## 2011-11-26 MED ORDER — GLIPIZIDE 5 MG PO TABS: 2.5000 mg | ORAL_TABLET | Freq: Two times a day (BID) | ORAL | Status: DC

## 2011-11-26 NOTE — Progress Notes (Signed)
11/26/2011 patient had a 1.36 second pause at 0918,. Blood pressure was 182/104, 98%RA, 96, 18 at 0940. Patient was given schedule Norvasc at 0941.  Md is aware. Biochemist, clinical.

## 2011-11-26 NOTE — Progress Notes (Signed)
Speech Language Pathology Dysphagia Treatment Patient Details Name: [name] MRN: [id number] DOB: 21-Mar-1917 Today's Date: 11/26/2011 Time: 1100-1111 SLP Time Calculation (min): 11 min  Assessment / Plan / Recommendation Clinical Impression  Patient without complaint this am, and states that she is eating better, although she does not particularly like the food on the Dysphagia 2 (chopped/minced) diet.  Patient is for f/u with GI, therefore, defer further recommendations to GI, as this appears to be an esophageal dysphagia.    Diet Recommendation  Continue with Current Diet: Dysphagia 2 (fine chop);Thin liquid    SLP Plan     Pertinent Vitals/Pain n/a   Swallowing Goals     General    Oral Cavity - Oral Hygiene     Dysphagia Treatment Treatment focused on: Skilled observation of diet tolerance;Patient/family/caregiver education Treatment Methods/Modalities: Skilled observation Feeding: Able to feed self;Needs set up Liquids provided via: Cup Type of cueing: Verbal Amount of cueing: Minimal   GO     [name] T 11/26/2011, 1:27 PM

## 2011-11-26 NOTE — Discharge Summary (Signed)
Physician Discharge Summary  [name] JYN:[id number] DOB: June 10, 1917 DOA: 11/22/2011  PCP: [name], MD  Admit date: 11/22/2011 Discharge date: 11/26/2011  Recommendations for Outpatient Follow-up:  1. Pt will need to follow up with PCP in 2-3 weeks post discharge 2. Followup gastroenterology in 2 weeks, Dr. Jeani Hawking  Discharge Diagnoses:  Acute kidney injury  -A combination of volume depletion, ARB, Bumex at home, and recent CT contrast approximately 5 days prior to admission.  -Improved Generalized weakness/right arm weakness  -gradually improving  -UA is negative  -MRI brain shows advanced atrophy, no acute stroke  -Carotid ultrasound negative  -Likely multifactorial including deconditioning  Memory loss  -stable/improving  -Suspect underlying dementia  -B12, TSH, RPR negative  -MRI brain 11/22/2011 revealed advanced atrophy without acute stroke Hypercalcemia  -Likely due to the Rangel's acute kidney injury and dehydration  -Improved with intravenous fluids  Dysphagia  -Appreciate Dr. Jeani Hawking, gastroenterology, plans noted for outpatient manometry  -Dysphagia 2 diet per speech therapy recommendation  Diabetes mellitus type 2  -Discontinue glyburide and metformin  -Hemoglobin A1c 9.1, sugar remarkably controlled during the hospitalization  -Given the Rangel's age, and American diabetic association guidelines, will allow more liberal control of her glycemia -Start glipizide 2.5 mg twice a day  Hyperlipidemia  -Discontinue Zocor and Zetia -Due to Rangel's elevated CPK and her age of 13, the risk outweighs the benefit of continuing these medications at this time -This was discussed with the Rangel's daughter and she is in agreement to Hypertension  -Restart losartan 50 mg daily -continue amlodipine to 10 mg daily  Deconditioning  -PT/OT eval  -Plan for skilled nursing facility; consult social work  Rectal prolapse  -Prolapse is very mild and is  reducible  -Rangel has internal hemorrhoids  -Prescribed Preparation H  -Start Colace  -Discontinue aspirin if bleeding worsens  Elevated CPK  -discontinue zocor, zetia   Discharge Condition: Stable  Disposition:  to nursing facility  Diet: Cardiac diet Wt Readings from Last 3 Encounters:  11/25/11 51.982 kg (114 lb 9.6 oz)    History of present illness:  78 year old female who presents with 2-3 week history of generalized weakness. The Rangel is a poor historian partly due to her encephalopathy at this time. Much of the history is obtained from the daughter who is at the bedside. The daughter relates that the Rangel's weakness has progressively gotten worse over the past week. In fact, the Rangel has had much difficulty getting out of bed and has required her assistance in this past week. In addition, the Rangel has been very unstable on her feet, but she has not fallen. The daughter states that the Rangel has been using a number of to aid her ambulation as well as a shopping cart at home. The Rangel lives alone at home. Daughter states that she lives 5 minutes away, and she visits twice a day. There has been no new medications introduced in the past few weeks.  The Rangel denies any fevers, chills, chest pain, shortness of breath, nausea, vomiting, abdominal pain, dysuria, headaches, falls, syncope. The Rangel complains of difficulty swallowing. An EGD was performed by Dr. Jeani Hawking about one month ago which was negative according to the Rangel's daughter. The Rangel frequently eats without her dentures, but the daughter states that the Rangel continues to have difficulty swallowing even when she has dentures. Most of the difficulty swallowing involves solid foods, but not to liquids. The Rangel also relates new onset right arm weakness that started  this morning of admission around 8 to 9 am. She complains some dizziness but denies any slurred speech or visual changes at this  time. The daughter also relates that the Rangel frequently forgets to take her medications on a regular basis.   Hospital Course:  The Rangel was admitted and started on intravenous fluids. The Rangel's serum creatinine continued to improve. At the time of discharge, her serum creatinine was 1.04 which is her baseline. The Rangel's oral intake continued to improve. Her generalized weakness gradually improved albeit she continued to remain weak. MRI of the brain did not show any acute stroke. The Rangel worked with physical therapy. It is thought that her residual weakness may have a component of deconditioning. Regarding her memory loss, workup including B12, TSH, RPR, MRI brain were unremarkable. It is thought that the Rangel likely has a component of underlying dementia. At the time of admission, the Rangel was hypercalcemic. This improved with hydration. Dr. Luisa Hart hung so the Rangel for gastroenterology. He recommended outpatient followup and possible manometry. Speech therapy also evaluated the Rangel, and they found that the Rangel would benefit from a dysphagia type II diet. Regarding her diabetes, her hemoglobin A1c was 9.1. However given the Rangel's age, more liberal glycemic control will be pursued. To this end, her metformin will be discontinued due to her chronic renal insufficiency. The Rangel will be discharged on glipizide 2.5 mg twice a day. Her glyburide was discontinued due to the Rangel's CKD and its long half-life. The Rangel was found to have an elevated CPK of 374 at the time of admission. Her Zocor and Zetia were discontinued. Given the Rangel's age and lack of acute cardiovascular event the risk is greater benefit of continuing these agents at this time. This was discussed with the Rangel's daughter who is in agreement. Her hypertension continued to be suboptimal. Her amlodipine was increased to 10 mg. Her losartan was restarted.her Bumex was discontinued due to her  acute renal failure. The Rangel participated in physical therapy. The Rangel's daughter agreed that she would benefit from a short stay at a skilled nursing facility. The Rangel was found to have some rectal bleeding. This was thought to be due to her hemorrhoids. The Rangel was given hydrocortisone external cream helped the irritation. The Rangel was given Colace to help with her stool softening.   Consultants: Gastroenterology, Dr. Jeani Hawking  Discharge Exam: Filed Vitals:   11/26/11 1400  BP: 146/90  Pulse: 75  Temp: 97.5 F (36.4 C)  Resp: 18   Filed Vitals:   11/25/11 2228 11/26/11 0512 11/26/11 0940 11/26/11 1400  BP: 149/96 155/95 182/104 146/90  Pulse: 71 78 96 75  Temp: 98.2 F (36.8 C) 97.8 F (36.6 C) 98.1 F (36.7 C) 97.5 F (36.4 C)  TempSrc: Oral Oral Oral Oral  Resp: 18 18 18 18   Height: 5\' 4"  (1.626 m)     Weight: 51.982 kg (114 lb 9.6 oz)     SpO2: 99% 99% 98% 97%   General: A&O x 3, NAD, pleasant, cooperative Cardiovascular: RRR, no rub, no gallop, no S3 Respiratory: CTAB, no wheeze, no rhonchi Abdomen:soft, nontender, nondistended, positive bowel sounds  Discharge Instructions      Discharge Orders    Future Orders Please Complete By Expires   Diet - low sodium heart healthy      Increase activity slowly          Medication List     As of 11/26/2011  3:49 PM  STOP taking these medications         bumetanide 1 MG tablet   Commonly known as: BUMEX      ezetimibe 10 MG tablet   Commonly known as: ZETIA      gabapentin 300 MG capsule   Commonly known as: NEURONTIN      glyBURIDE-metformin 1.25-250 MG per tablet   Commonly known as: GLUCOVANCE      simvastatin 40 MG tablet   Commonly known as: ZOCOR      TAKE these medications         allopurinol 100 MG tablet   Commonly known as: ZYLOPRIM   Take 100 mg by mouth 2 (two) times daily.      amLODipine 10 MG tablet   Commonly known as: NORVASC   Take 1 tablet (10 mg total)  by mouth daily.      aspirin 81 MG EC tablet   Take 1 tablet (81 mg total) by mouth daily.      aspirin EC 81 MG tablet   Take 81 mg by mouth daily.      DSS 100 MG Caps   Take 100 mg by mouth 2 (two) times daily.      glipiZIDE 5 MG tablet   Commonly known as: GLUCOTROL   Take 0.5 tablets (2.5 mg total) by mouth 2 (two) times daily before a meal.      glycopyrrolate 1 MG tablet   Commonly known as: ROBINUL   Take 1 mg by mouth 2 (two) times daily.      omeprazole 40 MG capsule   Commonly known as: PRILOSEC   Take 40 mg by mouth daily.          The results of significant diagnostics from this hospitalization (including imaging, microbiology, ancillary and laboratory) are listed below for reference.    Significant Diagnostic Studies: Dg Chest 2 View  11/22/2011  *RADIOLOGY REPORT*  Clinical Data: Weakness  CHEST - 2 VIEW  Comparison: 12/22/2010  Findings: Cardiomediastinal silhouette is stable.  No acute infiltrate or pulmonary edema.  Bony thorax is stable.  Again noted ectatic tortuous thoracic aorta.  Atherosclerotic calcifications of thoracic aorta are stable. Stable mild degenerative changes bilateral shoulders.  IMPRESSION: No active disease.  No significant change.   Original Report Authenticated By: Natasha Mead, M.D.    Ct Head Wo Contrast  11/22/2011  *RADIOLOGY REPORT*  Clinical Data: Pain, weakness, weight loss  CT HEAD WITHOUT CONTRAST  Technique:  Contiguous axial images were obtained from the base of the skull through the vertex without contrast.  Comparison: 12/22/2010  Findings: No evidence of parenchymal hemorrhage or extra-axial fluid collection. No mass lesion, mass effect, or midline shift.  No CT evidence of acute infarction.  Subcortical white matter and periventricular small vessel ischemic changes.  Intracranial atherosclerosis.  Global cortical atrophy, possibly age appropriate.  Secondary ventricular prominence.  Mild mucosal thickening in the right maxillary  sinus.  Visualized paranasal sinuses and mastoid air cells are otherwise clear.  No evidence of calvarial fracture.  IMPRESSION: No evidence of acute intracranial abnormality.  Atrophy with small vessel ischemic changes and intracranial atherosclerosis.   Original Report Authenticated By: Charline Bills, M.D.    Mr Brain Wo Contrast  11/22/2011  *RADIOLOGY REPORT*  Clinical Data: Increasing right arm weakness with mental declined.  MRI HEAD WITHOUT CONTRAST  Technique:  Multiplanar, multiecho pulse sequences of the brain and surrounding structures were obtained according to standard protocol without intravenous contrast.  Comparison:  CT head 11/22/2011  Findings: The Rangel had difficulty remaining motionless for the study.  Images are suboptimal.  Small or subtle lesions could be overlooked.  There is no evidence for acute infarction, intracranial hemorrhage, mass lesion, hydrocephalus, or extra-axial fluid. Moderate to severe atrophy is present.  Advanced chronic microvascular ischemic change is present in the periventricular and subcortical white matter.  Dolichoectatic cerebral vasculature.  Both vertebrals contribute to formation of the basilar.  The basilar appears thick-walled in the prepontine region (images 6-8, series 5-7  No definite thrombus or narrowing. Central flow void maintained.  Nonocclusive basilar dissection not excluded.  Formal catheter angiogram could provide additional information.  Flow voids are maintained in both carotids.  Bilateral cataract extraction.  Normal pituitary.  No cerebellar tonsillar herniation.  Moderate spondylosis. Mild bilateral chronic sinus disease.  No acute mastoid fluid.  IMPRESSION: Motion degraded images.  Small or subtle lesions could be overlooked.  No acute stroke or bleed.  Advanced atrophy and chronic microvascular ischemic change.  The moderate thick-walled basilar artery.  Nonocclusive dissection not excluded.   Original Report Authenticated By: Elsie Stain, M.D.    Ct Abdomen Pelvis W Contrast  11/16/2011  *RADIOLOGY REPORT*  Clinical Data: Abdominal pain, vomiting  CT ABDOMEN AND PELVIS WITH CONTRAST  Technique:  Multidetector CT imaging of the abdomen and pelvis was performed following the standard protocol during bolus administration of intravenous contrast.  Contrast: 80mL OMNIPAQUE IOHEXOL 300 MG/ML  SOLN  Comparison: CT abdomen pelvis of 09/20/2011  Findings: The lung bases are clear with no change in probable calcified granuloma at the right lung base above hemidiaphragm. Cardiomegaly is stable.  There is atheromatous change throughout the distal thoracic aorta.  The liver enhances with no significant interval change noted.  No ductal dilatation is present.  No calcified gallstones are noted.  The common bile duct and pancreatic duct are slightly prominent but stable, and the pancreas is normal in size with no interval change evident.  The adrenal glands and spleen are unremarkable.  The stomach is not well distended.  The kidneys enhance with no calculus or mass and no hydronephrosis is seen.  On delayed images, the pelvocaliceal systems are unremarkable.  There is fusiform dilatation of the abdominal aorta but no focal aneurysm is seen.  Moderate atheromatous changes noted.  The common iliac arteries are very tortuous.  The uterus is to the left midline and contains dense calcification consistent with uterine fibroids.  No adnexal lesion is seen.  No free fluid is noted within the pelvis.  The urinary bladder is moderately distended with no abnormality noted.  Multiple rectosigmoid colonic diverticula are present.  The appendix fills with contrast and is unremarkable.  No distention of small bowel loops is seen.  There is degenerative disc disease at the L3-4 level.  IMPRESSION:  1.  No explanation for the Rangel's pain is seen. 2.  Somewhat prominent pancreatic duct which appears stable when compared to the prior exam. 3.  Fusiform dilatation  of the abdominal aorta with diffuse atheromatous change. 4.  Multiple rectosigmoid colonic diverticula.   Original Report Authenticated By: Juline Patch, M.D.      Microbiology: Recent Results (from the past 240 hour(s))  URINE CULTURE     Status: Normal   Collection Time   11/22/11  4:20 PM      Component Value Range Status Comment   Specimen Description URINE, CATHETERIZED   Final    Special Requests [facility] ADD [phone]  0710   Final    Culture  Setup Time 11/23/2011 11:09   Final    Colony Count >=100,000 COLONIES/ML   Final    Culture ESCHERICHIA COLI   Final    Report Status 11/25/2011 FINAL   Final    Organism ID, Bacteria ESCHERICHIA COLI   Final      Labs: Basic Metabolic Panel:  Lab 11/25/11 6295 11/24/11 0458 11/23/11 0550 11/22/11 1242  NA 142 147* 143 142  K 3.5 2.9* -- --  CL 107 109 104 100  CO2 26 28 30  34*  GLUCOSE 159* 145* 180* 203*  BUN 14 17 28* 38*  CREATININE 1.04 1.19* 1.33* 1.75*  CALCIUM 8.6 8.8 9.4 10.4  MG 1.8 -- 1.8 --  PHOS -- -- -- --   Liver Function Tests:  Lab 11/22/11 1242  AST 30  ALT 23  ALKPHOS 59  BILITOT 0.2*  PROT 7.0  ALBUMIN 3.5   No results found for this basename: LIPASE:5,AMYLASE:5 in the last 168 hours No results found for this basename: AMMONIA:5 in the last 168 hours CBC:  Lab 11/23/11 0550 11/22/11 1242  WBC 6.3 6.0  NEUTROABS -- --  HGB 12.2 13.5  HCT 36.7 39.9  MCV 89.1 88.3  PLT 187 212   Cardiac Enzymes:  Lab 11/25/11 1345 11/25/11 0555 11/24/11 0458 11/22/11 2154 11/22/11 1726 11/22/11 1242  CKTOTAL 333* 419* 293* -- 374* --  CKMB -- -- -- -- -- --  CKMBINDEX -- -- -- -- -- --  TROPONINI -- -- -- <0.30 <0.30 <0.30   BNP: No components found with this basename: POCBNP:5 CBG:  Lab 11/26/11 1214 11/26/11 0751 11/25/11 1719 11/25/11 1132 11/25/11 0736  GLUCAP 140* 149* 136* 174* 166*    Time coordinating discharge:  Greater than 30 minutes  Signed:  Hillard Goodwine, DO [facility] Pager:  [phone] 11/26/2011, 3:49 PM

## 2011-11-26 NOTE — Progress Notes (Signed)
INITIAL ADULT NUTRITION ASSESSMENT Date: 11/26/2011   Time: 11:22 AM  Reason for Assessment: Malnutrition Screening  INTERVENTION: 1. Glucerna Shake po daily, each supplement provides 220 kcal and 10 grams of protein. 2. Discussed high calorie/high protein options for patient with dysphagia 2 diet limitations 3. Monitor magnesium, potassium, and phosphorus daily for at least 3 days, MD to replete as needed, as pt is at risk for refeeding syndrome given dx of severe acute malnutrition. 4. RD to continue to follow nutrition care plan   DOCUMENTATION CODES Per approved criteria  -Severe malnutrition in the context of acute illness or injury   ASSESSMENT: Female 78 y.o.  Dx: generalized weakness  Hx:  Past Medical History  Diagnosis Date  . UTI (urinary tract infection)   . Hypertension   . Diabetes mellitus   . Gout   . High cholesterol   . Complication of anesthesia    History reviewed. No pertinent past surgical history.  Related Meds:     . amLODipine  10 mg Oral Daily  . aspirin EC  81 mg Oral Daily  . docusate sodium  100 mg Oral BID  . enoxaparin (LOVENOX) injection  30 mg Subcutaneous Q24H  . hydrocortisone cream   Topical BID  . insulin aspart  0-15 Units Subcutaneous TID WC  . insulin aspart  0-5 Units Subcutaneous QHS  . pneumococcal 23 valent vaccine  0.5 mL Intramuscular Tomorrow-1000  . DISCONTD: ezetimibe  10 mg Oral Daily  . DISCONTD: insulin aspart  0-5 Units Subcutaneous QHS  . DISCONTD: insulin aspart  0-9 Units Subcutaneous TID WC  . DISCONTD: simvastatin  20 mg Oral QPM   Ht: 5\' 4"  (162.6 cm)  Wt: 114 lb 9.6 oz (51.982 kg)  Ideal Wt: 120 lb/54.5 kg % Ideal Wt: 95%  Wt Readings from Last 15 Encounters:  11/25/11 114 lb 9.6 oz (51.982 kg)  Usual Wt: 130 lb 1 month ago per family % Usual Wt: 88%; 12% wt loss x 1 month  Body mass index is 19.67 kg/(m^2). weight is WNL  Food/Nutrition Related Hx: poor intake x 1 month per family  Labs:    CMP     Component Value Date/Time   NA 142 11/25/2011 0555   K 3.5 11/25/2011 0555   CL 107 11/25/2011 0555   CO2 26 11/25/2011 0555   GLUCOSE 159* 11/25/2011 0555   BUN 14 11/25/2011 0555   CREATININE 1.04 11/25/2011 0555   CALCIUM 8.6 11/25/2011 0555   PROT 7.0 11/22/2011 1242   ALBUMIN 3.5 11/22/2011 1242   AST 30 11/22/2011 1242   ALT 23 11/22/2011 1242   ALKPHOS 59 11/22/2011 1242   BILITOT 0.2* 11/22/2011 1242   GFRNONAA 46* 11/25/2011 0555   GFRAA 53* 11/25/2011 0555   Magnesium  Date/Time Value Range Status  11/25/2011  5:55 AM 1.8  1.5 - 2.5 mg/dL Final   No results found for this basename: phos     Intake/Output Summary (Last 24 hours) at 11/26/11 1131 Last data filed at 11/26/11 0900  Gross per 24 hour  Intake 2041.25 ml  Output    750 ml  Net 1291.25 ml  BM 9/22  Diet Order: Dysphagia 2 with thin liquids  Supplements/Tube Feeding: none  IVF:    sodium chloride 0.45 % 1,000 mL with potassium chloride 20 mEq infusion Last Rate: 75 mL/hr at 11/25/11 2146    Estimated Nutritional Needs:   Kcal: 1100 - 1400 kcal Protein:  56 - 70 grams Fluid: 1.5 -  1.7 liters daily  Pt admitted with 2-3 week hx of generalized weakness. Per daughter, the patient frequently eats without her dentures, but the daughter states that the patient continues to have difficulty swallowing even when she has dentures. Most of the difficulty swallowing involves solid foods, but not to liquids.  Gastroenterology was consulted for dysphagia. Per GI MD, pt may have a motility disorder, which will need outpatient work-up. Per MD, pt may be have a regular diet.  BSE completed 9/20 recommending dysphagia 2 diet with thin liquids. Pt is currently consuming 50% of meals. Nutrition hx was obtained from daughter at bedside. Daughter reports that pt weighed 130lb approximately 1 month ago and has been eating poorly since 2/2 problems with swallowing. Dietary recall reveals very limited intake in protein. Pt  meets criteria for severe malnutrition in the context of acute illness as evidenced by 12% wt loss x 1 month and intake of <50% of estimated needs x at least 7 days. Pt is at risk for refeeding syndrome given dx of severe acute malnutrition.  No skin breakdown noted.  Discussed appropriate high calorie/high protein options for dysphagia 2 diet.  NUTRITION DIAGNOSIS: -Inadequate oral intake r/t poor appetite AEB pt report of ongoing weight loss and dietary recall.  MONITORING/EVALUATION(Goals): Goal: Pt to meet >/= 90% of their estimated nutrition needs Monitor: weight trends, lab trends, I/O's, PO intake, supplement tolerance  EDUCATION NEEDS: -Education needs addressed  Jarold Motto MS, RD, LDN Pager: [phone] After-hours pager: [phone]

## 2011-12-10 ENCOUNTER — Encounter (HOSPITAL_COMMUNITY): Admission: RE | Payer: Self-pay | Source: Ambulatory Visit

## 2011-12-10 ENCOUNTER — Ambulatory Visit (HOSPITAL_COMMUNITY): Admission: RE | Admit: 2011-12-10 | Payer: Medicare Other | Source: Ambulatory Visit | Admitting: Gastroenterology

## 2011-12-10 SURGERY — MANOMETRY, ESOPHAGUS

## 2012-03-15 ENCOUNTER — Emergency Department (HOSPITAL_COMMUNITY): Payer: Medicare Other

## 2012-03-15 ENCOUNTER — Emergency Department (HOSPITAL_COMMUNITY)
Admission: EM | Admit: 2012-03-15 | Discharge: 2012-03-15 | Disposition: A | Discharge status: Home / Self Care | Payer: Medicare Other | Attending: Emergency Medicine | Admitting: Emergency Medicine

## 2012-03-15 ENCOUNTER — Encounter (HOSPITAL_COMMUNITY): Payer: Self-pay | Admitting: Nurse Practitioner

## 2012-03-15 DIAGNOSIS — Z79899 Other long term (current) drug therapy: Secondary | ICD-10-CM | POA: Insufficient documentation

## 2012-03-15 DIAGNOSIS — E119 Type 2 diabetes mellitus without complications: Secondary | ICD-10-CM | POA: Insufficient documentation

## 2012-03-15 DIAGNOSIS — M109 Gout, unspecified: Secondary | ICD-10-CM | POA: Insufficient documentation

## 2012-03-15 DIAGNOSIS — R220 Localized swelling, mass and lump, head: Secondary | ICD-10-CM | POA: Insufficient documentation

## 2012-03-15 DIAGNOSIS — J329 Chronic sinusitis, unspecified: Secondary | ICD-10-CM

## 2012-03-15 DIAGNOSIS — E7800 Pure hypercholesterolemia, unspecified: Secondary | ICD-10-CM | POA: Insufficient documentation

## 2012-03-15 DIAGNOSIS — J019 Acute sinusitis, unspecified: Secondary | ICD-10-CM | POA: Insufficient documentation

## 2012-03-15 DIAGNOSIS — I10 Essential (primary) hypertension: Secondary | ICD-10-CM | POA: Insufficient documentation

## 2012-03-15 DIAGNOSIS — Z87440 Personal history of urinary (tract) infections: Secondary | ICD-10-CM | POA: Insufficient documentation

## 2012-03-15 DIAGNOSIS — Z7982 Long term (current) use of aspirin: Secondary | ICD-10-CM | POA: Insufficient documentation

## 2012-03-15 LAB — CBC
MCV: 88.1 fL (ref 78.0–100.0)
Platelets: 221 10*3/uL (ref 150–400)
RDW: 14.8 % (ref 11.5–15.5)
WBC: 7.6 10*3/uL (ref 4.0–10.5)

## 2012-03-15 LAB — BASIC METABOLIC PANEL
Chloride: 96 mEq/L (ref 96–112)
Creatinine, Ser: 1.03 mg/dL (ref 0.50–1.10)
GFR calc Af Amer: 53 mL/min — ABNORMAL LOW (ref >90)
Potassium: 3.4 mEq/L — ABNORMAL LOW (ref 3.5–5.1)
Sodium: 139 mEq/L (ref 135–145)

## 2012-03-15 MED ORDER — AMOXICILLIN 500 MG PO CAPS: 500.0000 mg | ORAL_CAPSULE | Freq: Three times a day (TID) | ORAL | Status: DC

## 2012-03-15 MED ORDER — SODIUM CHLORIDE 0.9 % IV BOLUS (SEPSIS)
500.0000 mL | Freq: Once | INTRAVENOUS | Status: AC
  Administered 2012-03-15: 500 mL via INTRAVENOUS

## 2012-03-15 MED ORDER — CEPHALEXIN 500 MG PO CAPS: 500.0000 mg | ORAL_CAPSULE | Freq: Four times a day (QID) | ORAL | Status: DC

## 2012-03-15 MED ORDER — IOHEXOL 300 MG/ML  SOLN
80.0000 mL | Freq: Once | INTRAMUSCULAR | Status: AC | PRN
  Administered 2012-03-15: 80 mL via INTRAVENOUS

## 2012-03-15 NOTE — ED Notes (Signed)
Family at bedside. 

## 2012-03-15 NOTE — ED Notes (Addendum)
Pt states she was diagnosed with an infection in L eye at opthalmologist this week, started on prednisone eye drops but pain persists and now she has L facial swelling. Family applied heat packs to face with some relief. A&Ox4, resp e/u

## 2012-03-15 NOTE — ED Notes (Signed)
Patient says she went to the eye doctor because she was experiencing eye pain and her eye was red.  They gave her prednisolone ophthalmic solution 1% to treat her eye.  Now she comes to the ED because her eye and her jaw hurts.  She is complaining of pain on the left side of her face.  She said her left side of her face was swollen and she put a warm compress on it yesterday Patient does have swelling near the jaw line of the left side.

## 2012-03-15 NOTE — ED Provider Notes (Signed)
History   This chart was scribed for non-physician practitioner working with Hurman Horn, MD by Smitty Pluck. This patient was seen in room TR04C/TR04C and the patient's care was started at 7:28 PM.   CSN: [id number]  Arrival date & time 03/15/12  1711      Chief Complaint  Patient presents with  . Eye Pain    (Consider location/radiation/quality/duration/timing/severity/associated sxs/prior treatment) Patient is a 79 y.o. female presenting with eye pain. The history is provided by the patient. No language interpreter was used.  Eye Pain This is a new problem. The current episode started more than 2 days ago. The problem occurs constantly. The problem has not changed since onset.Pertinent negatives include no shortness of breath. Nothing aggravates the symptoms. Nothing relieves the symptoms. She has tried a warm compress for the symptoms. The treatment provided mild relief.   [name] is a 79 y.o. female who presents to the Emergency Department complaining of constant, moderate left eye pain onset 1 week ago. Pt was seen by opthamologist (Dr. Marva Panda) and prescribed prednisolone eye drops and diagnosed with left eye gout per daughter. Pt reports that she has not had relief after using eye drops 4x/day. She reports that she felt tingling sensation around left eye and scratched the area. Shortly after scratching area she noticed left sided facial swelling. She used warm compress with minor relief of facial swelling. She denies visual disturbance, fever, chills, nausea and any other pain. pmh diabetes   Past Medical History  Diagnosis Date  . UTI (urinary tract infection)   . Hypertension   . Diabetes mellitus   . Gout   . High cholesterol   . Complication of anesthesia     History reviewed. No pertinent past surgical history.  History reviewed. No pertinent family history.  History  Substance Use Topics  . Smoking status: Never Smoker   . Smokeless tobacco: Never  Used  . Alcohol Use: No    OB History    Grav Para Term Preterm Abortions TAB SAB Ect Mult Living                  Review of Systems  Constitutional: Negative for fever and chills.  HENT: Positive for facial swelling.   Eyes: Positive for pain. Negative for visual disturbance.  Respiratory: Negative for shortness of breath.   Gastrointestinal: Negative for nausea and vomiting.  Neurological: Negative for weakness.  All other systems reviewed and are negative.    Allergies  Review of patient's allergies indicates no known allergies.  Home Medications   Current Outpatient Rx  Name  Route  Sig  Dispense  Refill  . ALLOPURINOL 100 MG PO TABS   Oral   Take 100 mg by mouth 2 (two) times daily.         Marland Kitchen AMLODIPINE BESYLATE 10 MG PO TABS   Oral   Take 1 tablet (10 mg total) by mouth daily.   30 tablet   3   . ASPIRIN 81 MG PO TBEC   Oral   Take 1 tablet (81 mg total) by mouth daily.         . DONEPEZIL HCL 10 MG PO TABS   Oral   Take 10 mg by mouth daily.         Marland Kitchen GLIPIZIDE 5 MG PO TABS   Oral   Take 0.5 tablets (2.5 mg total) by mouth 2 (two) times daily before a meal.   60 tablet  0   . GLYCOPYRROLATE 1 MG PO TABS   Oral   Take 1 mg by mouth 2 (two) times daily.         Marland Kitchen LINAGLIPTIN 5 MG PO TABS   Oral   Take 5 mg by mouth daily.         Marland Kitchen PAROXETINE HCL 10 MG PO TABS   Oral   Take 10 mg by mouth daily.         Marland Kitchen PREDNISOLONE ACETATE 1 % OP SUSP   Left Eye   Place 1 drop into the left eye 4 (four) times daily.         . BENEFIBER DRINK MIX PO PACK   Oral   Take 1 packet by mouth daily.           BP 174/86  Pulse 68  Temp 98 F (36.7 C) (Oral)  Resp 18  SpO2 99%  Physical Exam  Nursing note and vitals reviewed. Constitutional: She is oriented to person, place, and time. She appears well-developed and well-nourished. No distress.  HENT:  Head: Normocephalic and atraumatic.       Left facial swelling anterior to left  ear. Left periorbital swelling   Eyes: Conjunctivae normal and EOM are normal. Pupils are equal, round, and reactive to light.  Neck: Neck supple. No tracheal deviation present.  Cardiovascular: Normal rate.   Pulmonary/Chest: Effort normal. No respiratory distress.  Musculoskeletal: Normal range of motion.  Neurological: She is alert and oriented to person, place, and time.  Skin: Skin is warm and dry.  Psychiatric: She has a normal mood and affect. Her behavior is normal.    ED Course  Procedures (including critical care time) DIAGNOSTIC STUDIES: Oxygen Saturation is 99% on room air, normal by my interpretation.    COORDINATION OF CARE: 7:38 PM Discussed ED treatment with pt and Dr. Fonnie Jarvis. 10:01 PM Recheck: Informed pt of elevated CBG. Awaiting bolus and CT results.    Labs Reviewed  BASIC METABOLIC PANEL - Abnormal; Notable for the following:    Potassium 3.4 (*)     Glucose, Bld 247 (*)     BUN 24 (*)     GFR calc non Af Amer 46 (*)     GFR calc Af Amer 53 (*)     All other components within normal limits  CBC   Ct Maxillofacial W/cm  03/15/2012  *RADIOLOGY REPORT*  Clinical Data: Left orbital pain.  Left facial swelling.  CT MAXILLOFACIAL WITH CONTRAST  Technique:  Multidetector CT imaging of the maxillofacial structures was performed with intravenous contrast. Multiplanar CT image reconstructions were also generated.  Contrast: 80mL OMNIPAQUE IOHEXOL 300 MG/ML  SOLN  Comparison: None.  Findings: Mild soft tissue swelling about the left orbit.  No visible fluid collection.  Intraorbital structures are unremarkable.  Paranasal sinus disease.  Mucoperiosteal thickening within both maxillary sinuses, left greater than right.  No air fluid levels. Frontal sinuses are hypoplastic.  Mastoids are clear.  No acute bony abnormality.  IMPRESSION: Chronic maxillary sinusitis, left greater than right.  Slight soft tissue swelling over the left orbit.  No fluid collection to suggest abscess.    Original Report Authenticated By: Charlett Nose, M.D.      No diagnosis found.    MDM  79yo female with L eye pain x 4 days and L facial swelling x 2 days.  CT maxillofacial shows soft tissue swelling over the L orbit with no abscess and bilateral frontal sinusitis L>R.  Rx for amoxicillin and keflex.  Patient will follow up with pcp this week.  Daughter and patient agree with plan and are ready for discharge.  Shared visit with Dr. Fonnie Jarvis. Labs Reviewed  BASIC METABOLIC PANEL - Abnormal; Notable for the following:    Potassium 3.4 (*)     Glucose, Bld 247 (*)     BUN 24 (*)     GFR calc non Af Amer 46 (*)     GFR calc Af Amer 53 (*)     All other components within normal limits  CBC  LAB REPORT - SCANNED        I personally performed the services described in this documentation, which was scribed in my presence. The recorded information has been reviewed and is accurate.   Remi Haggard, NP 03/16/12 1108

## 2012-03-15 NOTE — ED Notes (Signed)
Patient is resting comfortably. 

## 2012-03-15 NOTE — ED Notes (Signed)
Patient transported back to FT04.

## 2012-03-15 NOTE — ED Notes (Signed)
Patient transported to CT 

## 2012-04-13 NOTE — ED Provider Notes (Signed)
Medical screening examination/treatment/procedure(s) were conducted as a shared visit with non-physician practitioner(s) and myself.  I personally evaluated the patient during the encounter.  Pt nontoxic-appearing.  Hurman Horn, MD 04/13/12 [phone]

## 2012-05-20 ENCOUNTER — Emergency Department (HOSPITAL_COMMUNITY)
Admission: EM | Admit: 2012-05-20 | Discharge: 2012-05-20 | Disposition: A | Discharge status: Home / Self Care | Payer: Medicare Other | Attending: Emergency Medicine | Admitting: Emergency Medicine

## 2012-05-20 ENCOUNTER — Emergency Department (HOSPITAL_COMMUNITY): Payer: Medicare Other

## 2012-05-20 ENCOUNTER — Encounter (HOSPITAL_COMMUNITY): Payer: Self-pay | Admitting: *Deleted

## 2012-05-20 DIAGNOSIS — Z79899 Other long term (current) drug therapy: Secondary | ICD-10-CM | POA: Insufficient documentation

## 2012-05-20 DIAGNOSIS — Z7982 Long term (current) use of aspirin: Secondary | ICD-10-CM | POA: Insufficient documentation

## 2012-05-20 DIAGNOSIS — R5381 Other malaise: Secondary | ICD-10-CM | POA: Insufficient documentation

## 2012-05-20 DIAGNOSIS — I10 Essential (primary) hypertension: Secondary | ICD-10-CM | POA: Insufficient documentation

## 2012-05-20 DIAGNOSIS — Z87440 Personal history of urinary (tract) infections: Secondary | ICD-10-CM | POA: Insufficient documentation

## 2012-05-20 DIAGNOSIS — R51 Headache: Secondary | ICD-10-CM | POA: Insufficient documentation

## 2012-05-20 DIAGNOSIS — E119 Type 2 diabetes mellitus without complications: Secondary | ICD-10-CM | POA: Insufficient documentation

## 2012-05-20 DIAGNOSIS — E7800 Pure hypercholesterolemia, unspecified: Secondary | ICD-10-CM | POA: Insufficient documentation

## 2012-05-20 DIAGNOSIS — Z862 Personal history of diseases of the blood and blood-forming organs and certain disorders involving the immune mechanism: Secondary | ICD-10-CM | POA: Insufficient documentation

## 2012-05-20 DIAGNOSIS — Z8639 Personal history of other endocrine, nutritional and metabolic disease: Secondary | ICD-10-CM | POA: Insufficient documentation

## 2012-05-20 LAB — CBC WITH DIFFERENTIAL/PLATELET
Basophils Absolute: 0 10*3/uL (ref 0.0–0.1)
Basophils Relative: 0 % (ref 0–1)
Hemoglobin: 12.6 g/dL (ref 12.0–15.0)
MCHC: 33.7 g/dL (ref 30.0–36.0)
Monocytes Relative: 7 % (ref 3–12)
Neutro Abs: 3.5 10*3/uL (ref 1.7–7.7)
Neutrophils Relative %: 55 % (ref 43–77)
RDW: 14.3 % (ref 11.5–15.5)

## 2012-05-20 LAB — BASIC METABOLIC PANEL
Chloride: 99 mEq/L (ref 96–112)
GFR calc Af Amer: 64 mL/min — ABNORMAL LOW (ref >90)
Potassium: 3.7 mEq/L (ref 3.5–5.1)

## 2012-05-20 LAB — URINALYSIS, ROUTINE W REFLEX MICROSCOPIC
Leukocytes, UA: NEGATIVE
Nitrite: NEGATIVE
Specific Gravity, Urine: 1.012 (ref 1.005–1.030)
pH: 7 (ref 5.0–8.0)

## 2012-05-20 LAB — GLUCOSE, CAPILLARY: Glucose-Capillary: 181 mg/dL — ABNORMAL HIGH (ref 70–99)

## 2012-05-20 MED ORDER — LORATADINE 10 MG PO TABS: 10.0000 mg | ORAL_TABLET | Freq: Every day | ORAL | Status: DC

## 2012-05-20 NOTE — ED Notes (Signed)
Pt c/o, what she feels, is a sinus infection x 1 month.  Today she began feeling "bad", and is all she can describe it as . . . Denies joint pain/body aches, sob, fevers or nausea.

## 2012-05-20 NOTE — ED Provider Notes (Signed)
History     CSN: [id number]  Arrival date & time 05/20/12  1341   First MD Initiated Contact with Patient 05/20/12 1525      Chief Complaint  Patient presents with  . Nasal Congestion    (Consider location/radiation/quality/duration/timing/severity/associated sxs/prior treatment) The history is provided by the patient and a relative.   patient is somewhat of a poor historian. She's here because she has had a sinus infection for the last month. She states it hurts today but has been hurting for the last month. She states she's been on antibiotics. She also states she's felt bad today. She is not able to elaborate on it more than that. It is different than her headache. No chest pain. No Abdominal pain. No nausea vomiting. No dysuria. No weakness.   Past Medical History  Diagnosis Date  . UTI (urinary tract infection)   . Hypertension   . Diabetes mellitus   . Gout   . High cholesterol   . Complication of anesthesia     History reviewed. No pertinent past surgical history.  No family history on file.  History  Substance Use Topics  . Smoking status: Never Smoker   . Smokeless tobacco: Never Used  . Alcohol Use: No    OB History   Grav Para Term Preterm Abortions TAB SAB Ect Mult Living                  Review of Systems  Constitutional: Negative for activity change and appetite change.  HENT: Negative for neck stiffness.   Eyes: Negative for pain.  Respiratory: Negative for chest tightness and shortness of breath.   Cardiovascular: Negative for chest pain and leg swelling.  Gastrointestinal: Negative for nausea, vomiting, abdominal pain and diarrhea.  Genitourinary: Negative for flank pain.  Musculoskeletal: Negative for back pain.  Skin: Negative for rash.  Neurological: Positive for headaches. Negative for weakness and numbness.  Psychiatric/Behavioral: Negative for behavioral problems.    Allergies  Review of patient's allergies indicates no known  allergies.  Home Medications   Current Outpatient Rx  Name  Route  Sig  Dispense  Refill  . allopurinol (ZYLOPRIM) 100 MG tablet   Oral   Take 100 mg by mouth 2 (two) times daily.         Marland Kitchen amLODipine (NORVASC) 10 MG tablet   Oral   Take 1 tablet (10 mg total) by mouth daily.   30 tablet   3   . aspirin EC 81 MG EC tablet   Oral   Take 1 tablet (81 mg total) by mouth daily.         Marland Kitchen donepezil (ARICEPT) 10 MG tablet   Oral   Take 10 mg by mouth daily.         Marland Kitchen gabapentin (NEURONTIN) 300 MG capsule   Oral   Take 300 mg by mouth 2 (two) times daily.         Marland Kitchen glipiZIDE (GLUCOTROL XL) 5 MG 24 hr tablet   Oral   Take 5 mg by mouth 2 (two) times daily.         Marland Kitchen glycopyrrolate (ROBINUL) 1 MG tablet   Oral   Take 1 mg by mouth 2 (two) times daily.         Marland Kitchen linagliptin (TRADJENTA) 5 MG TABS tablet   Oral   Take 5 mg by mouth daily.         Marland Kitchen PARoxetine (PAXIL) 10 MG tablet  Oral   Take 10 mg by mouth daily.         . prednisoLONE acetate (PRED FORTE) 1 % ophthalmic suspension   Left Eye   Place 1 drop into the left eye 4 (four) times daily.         . Wheat Dextrin (BENEFIBER DRINK MIX) PACK   Oral   Take 1 packet by mouth daily.         Marland Kitchen loratadine (CLARITIN) 10 MG tablet   Oral   Take 1 tablet (10 mg total) by mouth daily.   14 tablet   0     BP 163/96  Pulse 67  Temp(Src) 97.9 F (36.6 C) (Oral)  Resp 18  SpO2 93%  Physical Exam  Nursing note and vitals reviewed. Constitutional: She is oriented to person, place, and time. She appears well-developed and well-nourished.  HENT:  Head: Normocephalic and atraumatic.  Right Ear: External ear normal.  Left Ear: External ear normal.  Eyes: EOM are normal. Pupils are equal, round, and reactive to light.  Neck: Normal range of motion. Neck supple.  Cardiovascular: Normal rate, regular rhythm and normal heart sounds.   No murmur heard. Pulmonary/Chest: Effort normal and breath  sounds normal. No respiratory distress. She has no wheezes. She has no rales.  Abdominal: Soft. Bowel sounds are normal. She exhibits no distension. There is no tenderness. There is no rebound and no guarding.  Musculoskeletal: Normal range of motion.  Neurological: She is alert and oriented to person, place, and time. No cranial nerve deficit.  Patient is awake and appropriate, but refers to her family member to answer most questions.  Skin: Skin is warm and dry.  Psychiatric: She has a normal mood and affect. Her speech is normal.    ED Course  Procedures (including critical care time)  Labs Reviewed  GLUCOSE, CAPILLARY - Abnormal; Notable for the following:    Glucose-Capillary 181 (*)    All other components within normal limits  BASIC METABOLIC PANEL - Abnormal; Notable for the following:    Glucose, Bld 188 (*)    GFR calc non Af Amer 55 (*)    GFR calc Af Amer 64 (*)    All other components within normal limits  URINALYSIS, ROUTINE W REFLEX MICROSCOPIC - Abnormal; Notable for the following:    Glucose, UA 250 (*)    All other components within normal limits  CBC WITH DIFFERENTIAL  TROPONIN I   Dg Chest 2 View  05/20/2012  *RADIOLOGY REPORT*  Clinical Data: Weakness  CHEST - 2 VIEW  Comparison: Chest radiograph 11/22/2011, 12/22/2010, and 03/12/2009  Findings: There is cardiomegaly, which is likely accentuated by AP lordotic technique. The cardiopericardial silhouette does not appear larger on today's chest radiograph compared to the AP view chest radiograph of 11/22/2011.  Tortuous thoracic aorta.  Stable calcified left mediastinal lymph node.  Mild pulmonary vascular congestion.  No focal airspace disease, effusion, or pneumothorax.  The trachea is deviated to the right of midline.  This tracheal deviation appears progressive since the chest radiograph of 11/22/2011, and was not present on chest radiograph of 2012.  It could possibly be accentuated by patient positioning.  There  are degenerative changes of both shoulders.  Both humeral heads are high-riding suggesting chronic rotator cuff disease.  IMPRESSION:  1.  Cardiomegaly pulmonary vascular congestion. The cardiopericardial silhouette appears larger on today's chest radiograph compared to 11/22/2011.  On this may be due to differences in patient positioning, the possibility  of pericardial effusion cannot be excluded. 2.  Rightward deviation of the trachea appears progressive over time.  This could in part be due to the patient positioning and may be secondary to the tortuous thoracic aorta. Mass effect from a mass lesion such as a substernal border cannot be excluded.  A follow-up PA chest radiograph with the patient in the upright position, when the patient can tolerate, may be useful. 3.  Prior granulomatous disease.   Original Report Authenticated By: Britta Mccreedy, M.D.    Ct Head Wo Contrast  05/20/2012  *RADIOLOGY REPORT*  Clinical Data: Diabetic hypertensive patient with feeling of sinus infection.  Headache.  CT HEAD WITHOUT CONTRAST  Technique:  Contiguous axial images were obtained from the base of the skull through the vertex without contrast.  Comparison: 11/22/2011 MR and CT.  Findings: Motion degraded exam.  No intracranial hemorrhage.  Moderate small vessel disease type changes without CT evidence of large acute infarct.  Vascular calcifications.  Age related atrophy without hydrocephalus.  No intracranial mass lesion detected on this unenhanced exam.  Moderate mucosal thickening left maxillary sinus.  Mild mucosal thickening right maxillary sinus.  IMPRESSION: Motion degraded exam.  No intracranial hemorrhage.  Moderate small vessel disease type changes without CT evidence of large acute infarct.  Age related atrophy without hydrocephalus.  Moderate mucosal thickening left maxillary sinus.  Mild mucosal thickening right maxillary sinus.   Original Report Authenticated By: Lacy Duverney, M.D.      1. Headache      Date: 05/21/2012  Rate: 62  Rhythm: normal sinus rhythm  QRS Axis: normal  Intervals: normal  ST/T Wave abnormalities: nonspecific ST/T changes  Conduction Disutrbances:left anterior fascicular block  Narrative Interpretation: LAFB is new  Old EKG Reviewed: changes noted     MDM  Patient with headache. Recently treated for sinusitis and infection.also had some weakness. Lab work overall reassuring. Head CT shows only mucosal thickening. She'll be started on antihistamine.patient was told of tracheal shift and will be followed by her primary care Dr. Nickolas Madrid cardiac cause.        Juliet Rude. Rubin Payor, MD 05/21/12 2725

## 2012-05-20 NOTE — ED Notes (Signed)
Notified RN CBG 181

## 2012-08-24 ENCOUNTER — Encounter (HOSPITAL_COMMUNITY): Payer: Self-pay | Admitting: Adult Health

## 2012-08-24 ENCOUNTER — Emergency Department (HOSPITAL_COMMUNITY): Payer: Medicare Other

## 2012-08-24 ENCOUNTER — Emergency Department (HOSPITAL_COMMUNITY)
Admission: EM | Admit: 2012-08-24 | Discharge: 2012-08-25 | Disposition: A | Discharge status: Home / Self Care | Payer: Medicare Other | Attending: Emergency Medicine | Admitting: Emergency Medicine

## 2012-08-24 DIAGNOSIS — I10 Essential (primary) hypertension: Secondary | ICD-10-CM | POA: Insufficient documentation

## 2012-08-24 DIAGNOSIS — Z862 Personal history of diseases of the blood and blood-forming organs and certain disorders involving the immune mechanism: Secondary | ICD-10-CM | POA: Insufficient documentation

## 2012-08-24 DIAGNOSIS — E7800 Pure hypercholesterolemia, unspecified: Secondary | ICD-10-CM | POA: Insufficient documentation

## 2012-08-24 DIAGNOSIS — Z8639 Personal history of other endocrine, nutritional and metabolic disease: Secondary | ICD-10-CM | POA: Insufficient documentation

## 2012-08-24 DIAGNOSIS — Z7982 Long term (current) use of aspirin: Secondary | ICD-10-CM | POA: Insufficient documentation

## 2012-08-24 DIAGNOSIS — Z87440 Personal history of urinary (tract) infections: Secondary | ICD-10-CM | POA: Insufficient documentation

## 2012-08-24 DIAGNOSIS — E119 Type 2 diabetes mellitus without complications: Secondary | ICD-10-CM | POA: Insufficient documentation

## 2012-08-24 DIAGNOSIS — R109 Unspecified abdominal pain: Secondary | ICD-10-CM | POA: Insufficient documentation

## 2012-08-24 DIAGNOSIS — Z79899 Other long term (current) drug therapy: Secondary | ICD-10-CM | POA: Insufficient documentation

## 2012-08-24 LAB — URINALYSIS, ROUTINE W REFLEX MICROSCOPIC
Leukocytes, UA: NEGATIVE
Protein, ur: NEGATIVE mg/dL
Urobilinogen, UA: 0.2 mg/dL (ref 0.0–1.0)

## 2012-08-24 MED ORDER — ACETAMINOPHEN 500 MG PO TABS
1000.0000 mg | ORAL_TABLET | Freq: Once | ORAL | Status: AC
  Administered 2012-08-24: 1000 mg via ORAL
  Filled 2012-08-24: qty 2

## 2012-08-24 NOTE — ED Notes (Addendum)
Presents with left sided flank pain that began 2 days ago and has worsened today. Nothing makes better nothing makes pain worse. Pt denies radiation of pain. Pain does not move. Reports increase in urination, denies pain with urination. Denies SOB, denies chest pain.  Pain is described as sharp

## 2012-08-24 NOTE — ED Provider Notes (Signed)
History     CSN: [id number]  Arrival date & time 08/24/12  1807   First MD Initiated Contact with Patient 08/24/12 2316      Chief Complaint  Patient presents with  . Flank Pain    (Consider location/radiation/quality/duration/timing/severity/associated sxs/prior treatment) HPI Patient presents with pain in her left posterior hip and low back. Pain began 2 days ago and since onset has been waxing/waning, but increasingly severe with each recurrence.  There are no clear saturating or alleviating factors. There is no concurrent abdominal pain, fever, chills, nausea, vomiting, dysuria, hematuria. The patient states that she had a fall, in the time prior to the onset of pain. There is no distal dysesthesia or weakness. The patient is generally well, has a history of hypertension, diabetes, hyperlipidemia. No orthopedic procedures in the hips, knees, back.  Past Medical History  Diagnosis Date  . UTI (urinary tract infection)   . Hypertension   . Diabetes mellitus   . Gout   . High cholesterol   . Complication of anesthesia     History reviewed. No pertinent past surgical history.  History reviewed. No pertinent family history.  History  Substance Use Topics  . Smoking status: Never Smoker   . Smokeless tobacco: Never Used  . Alcohol Use: No    OB History   Grav Para Term Preterm Abortions TAB SAB Ect Mult Living                  Review of Systems  All other systems reviewed and are negative.    Allergies  Review of patient's allergies indicates no known allergies.  Home Medications   Current Outpatient Rx  Name  Route  Sig  Dispense  Refill  . allopurinol (ZYLOPRIM) 100 MG tablet   Oral   Take 100 mg by mouth 2 (two) times daily.         Marland Kitchen amLODipine (NORVASC) 10 MG tablet   Oral   Take 1 tablet (10 mg total) by mouth daily.   30 tablet   3   . aspirin EC 81 MG EC tablet   Oral   Take 1 tablet (81 mg total) by mouth daily.         Marland Kitchen  donepezil (ARICEPT) 10 MG tablet   Oral   Take 10 mg by mouth daily.         Marland Kitchen gabapentin (NEURONTIN) 300 MG capsule   Oral   Take 300 mg by mouth 2 (two) times daily.         Marland Kitchen glipiZIDE (GLUCOTROL XL) 5 MG 24 hr tablet   Oral   Take 5 mg by mouth 2 (two) times daily.         Marland Kitchen glycopyrrolate (ROBINUL) 1 MG tablet   Oral   Take 1 mg by mouth 2 (two) times daily.         Marland Kitchen linagliptin (TRADJENTA) 5 MG TABS tablet   Oral   Take 5 mg by mouth daily.         Marland Kitchen loratadine (CLARITIN) 10 MG tablet   Oral   Take 1 tablet (10 mg total) by mouth daily.   14 tablet   0   . PARoxetine (PAXIL) 10 MG tablet   Oral   Take 10 mg by mouth daily.         . prednisoLONE acetate (PRED FORTE) 1 % ophthalmic suspension   Left Eye   Place 1 drop into the left eye 4 (  four) times daily.         . Wheat Dextrin (BENEFIBER DRINK MIX) PACK   Oral   Take 1 packet by mouth daily.           BP 188/84  Pulse 64  Temp(Src) 98.2 F (36.8 C) (Oral)  Resp 16  SpO2 95%  Physical Exam  Nursing note and vitals reviewed. Constitutional: She is oriented to person, place, and time. She appears well-developed and well-nourished. No distress.  HENT:  Head: Normocephalic and atraumatic.  Eyes: Conjunctivae and EOM are normal.  Cardiovascular: Normal rate and regular rhythm.   Murmur heard. Pulmonary/Chest: Effort normal and breath sounds normal. No stridor. No respiratory distress.  Abdominal: She exhibits no distension.  Musculoskeletal: She exhibits no edema.  There is tenderness to palpation about the left posterior superior iliac crest, left lower back, left paraspinal region.  There is no gross deformity.  Patient flexes and extends the hip, though with pain in the aforementioned area.  Neurological: She is alert and oriented to person, place, and time. No cranial nerve deficit. She exhibits normal muscle tone. Coordination normal.  Skin: Skin is warm and dry. No pallor.   Psychiatric: She has a normal mood and affect. Her behavior is normal.    ED Course  Procedures (including critical care time)  Labs Reviewed  URINALYSIS, ROUTINE W REFLEX MICROSCOPIC - Abnormal; Notable for the following:    Glucose, UA 100 (*)    All other components within normal limits   No results found.   No diagnosis found.  Pulse ox 99% room air normal  12:46 AM On repeat exam the patient appears in no distress.  We discussed results, including labs and x-rays.  The patient has a primary care physician with whom she we'll speak tomorrow to insure appropriate ongoing care. MDM  Patient presents with new left sided pain about the posterior hip and low back.  On exam he would work him with no neurologic deficits.  Labs are reassuring, suspicious for occult stone or urinary tract infection.  In addition, the patient is afebrile, in no distress with no leukocytosis.  Given the patient's pain, she was started on a course of analgesics, discharged to follow up with her primary care physician.  Return precautions provided as well.        Gerhard Munch, MD 08/25/12 [phone]

## 2012-08-24 NOTE — ED Notes (Signed)
Pt. Also states she had a fall "a while back" and that she has not been as mobile. Pt. Walked in with a walker with no problems. Pt. Very vague about fall due to not wanting to talk about it in front of her daughter. States she got up to go to the bathroom and just went to the floor by her bed. Pt. Denies urinary symptoms.

## 2012-08-25 LAB — POCT I-STAT, CHEM 8
Chloride: 102 mEq/L (ref 96–112)
Creatinine, Ser: 1.1 mg/dL (ref 0.50–1.10)
Glucose, Bld: 163 mg/dL — ABNORMAL HIGH (ref 70–99)
HCT: 44 % (ref 36.0–46.0)
Hemoglobin: 15 g/dL (ref 12.0–15.0)
Potassium: 4.1 mEq/L (ref 3.5–5.1)
Sodium: 142 mEq/L (ref 135–145)

## 2012-08-25 LAB — CBC
HCT: 40.5 % (ref 36.0–46.0)
Hemoglobin: 13.5 g/dL (ref 12.0–15.0)
MCHC: 33.3 g/dL (ref 30.0–36.0)
WBC: 8.3 10*3/uL (ref 4.0–10.5)

## 2012-08-25 MED ORDER — TRAMADOL HCL 50 MG PO TABS: 50.0000 mg | ORAL_TABLET | Freq: Four times a day (QID) | ORAL | Status: DC | PRN

## 2012-10-06 ENCOUNTER — Emergency Department (HOSPITAL_COMMUNITY)
Admission: EM | Admit: 2012-10-06 | Discharge: 2012-10-07 | Disposition: A | Discharge status: Home / Self Care | Payer: Medicare Other | Attending: Emergency Medicine | Admitting: Emergency Medicine

## 2012-10-06 ENCOUNTER — Encounter (HOSPITAL_COMMUNITY): Payer: Self-pay

## 2012-10-06 DIAGNOSIS — M79609 Pain in unspecified limb: Secondary | ICD-10-CM | POA: Insufficient documentation

## 2012-10-06 DIAGNOSIS — M109 Gout, unspecified: Secondary | ICD-10-CM | POA: Insufficient documentation

## 2012-10-06 DIAGNOSIS — E1169 Type 2 diabetes mellitus with other specified complication: Secondary | ICD-10-CM | POA: Insufficient documentation

## 2012-10-06 DIAGNOSIS — M79672 Pain in left foot: Secondary | ICD-10-CM

## 2012-10-06 DIAGNOSIS — Z794 Long term (current) use of insulin: Secondary | ICD-10-CM | POA: Insufficient documentation

## 2012-10-06 DIAGNOSIS — Z8639 Personal history of other endocrine, nutritional and metabolic disease: Secondary | ICD-10-CM | POA: Insufficient documentation

## 2012-10-06 DIAGNOSIS — I10 Essential (primary) hypertension: Secondary | ICD-10-CM | POA: Insufficient documentation

## 2012-10-06 DIAGNOSIS — Z87440 Personal history of urinary (tract) infections: Secondary | ICD-10-CM | POA: Insufficient documentation

## 2012-10-06 DIAGNOSIS — Z7982 Long term (current) use of aspirin: Secondary | ICD-10-CM | POA: Insufficient documentation

## 2012-10-06 DIAGNOSIS — M7989 Other specified soft tissue disorders: Secondary | ICD-10-CM | POA: Insufficient documentation

## 2012-10-06 DIAGNOSIS — R739 Hyperglycemia, unspecified: Secondary | ICD-10-CM

## 2012-10-06 DIAGNOSIS — Z862 Personal history of diseases of the blood and blood-forming organs and certain disorders involving the immune mechanism: Secondary | ICD-10-CM | POA: Insufficient documentation

## 2012-10-06 DIAGNOSIS — Z79899 Other long term (current) drug therapy: Secondary | ICD-10-CM | POA: Insufficient documentation

## 2012-10-06 NOTE — ED Notes (Signed)
Pt c/o increased bil foot pain over past 2 weeks.  Pt st's she has gout.

## 2012-10-06 NOTE — ED Notes (Signed)
Pt c/o bilateral foot pain x2 weeks, pt reports she has a hx of gout

## 2012-10-07 LAB — CBC WITH DIFFERENTIAL/PLATELET
HCT: 38.8 % (ref 36.0–46.0)
Hemoglobin: 12.8 g/dL (ref 12.0–15.0)
Lymphs Abs: 2.4 10*3/uL (ref 0.7–4.0)
MCH: 29.1 pg (ref 26.0–34.0)
Monocytes Absolute: 0.4 10*3/uL (ref 0.1–1.0)
Monocytes Relative: 6 % (ref 3–12)
Neutro Abs: 3.2 10*3/uL (ref 1.7–7.7)
Neutrophils Relative %: 52 % (ref 43–77)
RBC: 4.4 MIL/uL (ref 3.87–5.11)

## 2012-10-07 LAB — POCT I-STAT, CHEM 8
Calcium, Ion: 1.2 mmol/L (ref 1.13–1.30)
Chloride: 103 mEq/L (ref 96–112)
Creatinine, Ser: 1.1 mg/dL (ref 0.50–1.10)
Glucose, Bld: 280 mg/dL — ABNORMAL HIGH (ref 70–99)
Potassium: 3.7 mEq/L (ref 3.5–5.1)

## 2012-10-07 MED ORDER — HYDROCODONE-ACETAMINOPHEN 7.5-325 MG/15ML PO SOLN
5.0000 mL | ORAL | Status: DC | PRN
  Administered 2012-10-07: 5 mL via ORAL
  Filled 2012-10-07: qty 15

## 2012-10-07 MED ORDER — HYDROCODONE-ACETAMINOPHEN 7.5-325 MG/15ML PO SOLN: 5.0000 mL | Freq: Four times a day (QID) | ORAL | Status: DC | PRN

## 2012-10-07 NOTE — ED Provider Notes (Signed)
CSN: [id number]     Arrival date & time 10/06/12  2246 History     First MD Initiated Contact with Patient 10/06/12 2345     Chief Complaint  Patient presents with  . Foot Pain   (Consider location/radiation/quality/duration/timing/severity/associated sxs/prior Treatment) HPI Comments: Patient reports, that she's had increasing bilateral foot and leg pain.  Seen by her care physician.  Approximately 3 weeks, ago, with same.  No new medications were prescribed.  Patient does have a history of this is similar in presentation to the episode of the outer layer in the she also reports that her sugars have been quite labile which is unusual for her daughter reports, that the redness has increased or swelling.  The labile sugars  Patient is a 79 y.o. female presenting with lower extremity pain. The history is provided by the patient.  Foot Pain This is a recurrent problem. The current episode started 1 to 4 weeks ago. The problem occurs constantly. The problem has been gradually worsening. Pertinent negatives include no chest pain, chills, fever or joint swelling. The symptoms are aggravated by exertion. Treatments tried: Routine medications. The treatment provided no relief.    Past Medical History  Diagnosis Date  . UTI (urinary tract infection)   . Hypertension   . Diabetes mellitus   . Gout   . High cholesterol   . Complication of anesthesia    History reviewed. No pertinent past surgical history. History reviewed. No pertinent family history. History  Substance Use Topics  . Smoking status: Never Smoker   . Smokeless tobacco: Never Used  . Alcohol Use: No   OB History   Grav Para Term Preterm Abortions TAB SAB Ect Mult Living                 Review of Systems  Unable to perform ROS Constitutional: Negative for fever and chills.  Respiratory: Negative for shortness of breath.   Cardiovascular: Positive for leg swelling. Negative for chest pain.  Musculoskeletal: Negative for  joint swelling.  Skin: Positive for wound.  All other systems reviewed and are negative.    Allergies  Review of patient's allergies indicates no known allergies.  Home Medications   Current Outpatient Rx  Name  Route  Sig  Dispense  Refill  . allopurinol (ZYLOPRIM) 100 MG tablet   Oral   Take 100 mg by mouth 2 (two) times daily.         Marland Kitchen amLODipine (NORVASC) 10 MG tablet   Oral   Take 1 tablet (10 mg total) by mouth daily.   30 tablet   3   . aspirin EC 81 MG EC tablet   Oral   Take 1 tablet (81 mg total) by mouth daily.         . cetirizine (ZYRTEC) 10 MG tablet   Oral   Take 10 mg by mouth daily.         Marland Kitchen donepezil (ARICEPT) 10 MG tablet   Oral   Take 10 mg by mouth at bedtime.          . gabapentin (NEURONTIN) 300 MG capsule   Oral   Take 300 mg by mouth 3 (three) times daily.          Marland Kitchen glipiZIDE (GLUCOTROL XL) 5 MG 24 hr tablet   Oral   Take 5 mg by mouth 2 (two) times daily.         Marland Kitchen glycopyrrolate (ROBINUL) 1 MG tablet   Oral  Take 1 mg by mouth 2 (two) times daily.         . Insulin Lispro Prot & Lispro (HUMALOG MIX 75/25 KWIKPEN) (75-25) 100 UNIT/ML SUPN   Subcutaneous   Inject 10-20 Units into the skin 2 (two) times daily. Use 20 units every morning and 10 units every evening         . Vortioxetine HBr (BRINTELLIX) 10 MG TABS   Oral   Take 5 mg by mouth daily.         . Wheat Dextrin (BENEFIBER DRINK MIX) PACK   Oral   Take 1 packet by mouth daily.         Marland Kitchen HYDROcodone-acetaminophen (HYCET) 7.5-325 mg/15 ml solution   Oral   Take 5 mLs by mouth every 6 (six) hours as needed.   60 mL   0    BP 167/82  Pulse 72  Temp(Src) 98.2 F (36.8 C) (Oral)  Resp 18  SpO2 95% Physical Exam  Nursing note and vitals reviewed. Constitutional: She is oriented to person, place, and time. She appears well-developed and well-nourished.  Eyes: Pupils are equal, round, and reactive to light.  Neck: Normal range of motion.    Cardiovascular: Normal rate and regular rhythm.   Pulmonary/Chest: Effort normal.  Musculoskeletal: She exhibits edema and tenderness.       Legs: Lower extremities, red, swollen, tender to superficial touch  Neurological: She is alert and oriented to person, place, and time.  Skin: Skin is warm. There is erythema.    ED Course   Procedures (including critical care time)  Labs Reviewed  GLUCOSE, CAPILLARY - Abnormal; Notable for the following:    Glucose-Capillary 247 (*)    All other components within normal limits  POCT I-STAT, CHEM 8 - Abnormal; Notable for the following:    Glucose, Bld 280 (*)    All other components within normal limits  CBC WITH DIFFERENTIAL   No results found. 1. Foot pain, bilateral   2. Hyperglycemia   3. Gout attack     MDM   Will check CBC i-STAT, CBG Is reviewed.  It does not appear to be a systemic infection.  We'll treat with low-dose Lortab elixir 5 cc every 6 hours as needed.  For severe pain and encourage the patient.  To follow up with her primary care physician.  This week to monitor her blood sugars and brings records with her at that time as her blood sugars slightly elevated at 245.  Arman Filter, NP 10/07/12 [phone]

## 2012-10-07 NOTE — ED Provider Notes (Signed)
Medical screening examination/treatment/procedure(s) were performed by non-physician practitioner and as supervising physician I was immediately available for consultation/collaboration.  Olivia Mackie, MD 10/07/12 [phone]

## 2012-10-09 ENCOUNTER — Other Ambulatory Visit: Payer: Self-pay | Admitting: Podiatry

## 2012-10-09 DIAGNOSIS — I739 Peripheral vascular disease, unspecified: Secondary | ICD-10-CM

## 2012-10-28 ENCOUNTER — Ambulatory Visit
Admission: RE | Admit: 2012-10-28 | Discharge: 2012-10-28 | Disposition: A | Discharge status: Home / Self Care | Payer: Medicare Other | Source: Ambulatory Visit | Attending: Podiatry | Admitting: Podiatry

## 2012-10-28 VITALS — BP 132/86 | HR 79 | Temp 98.4°F | Resp 15 | Ht 64.0 in | Wt 134.0 lb

## 2012-10-28 DIAGNOSIS — I739 Peripheral vascular disease, unspecified: Secondary | ICD-10-CM

## 2012-10-28 DIAGNOSIS — E1140 Type 2 diabetes mellitus with diabetic neuropathy, unspecified: Secondary | ICD-10-CM | POA: Insufficient documentation

## 2012-10-28 HISTORY — DX: Peripheral vascular disease, unspecified: I73.9

## 2012-10-28 HISTORY — DX: Type 2 diabetes mellitus with diabetic neuropathy, unspecified: E11.40

## 2012-11-24 ENCOUNTER — Encounter: Payer: Self-pay | Admitting: Surgery

## 2012-11-24 ENCOUNTER — Other Ambulatory Visit: Payer: Self-pay | Admitting: *Deleted

## 2012-11-24 DIAGNOSIS — I739 Peripheral vascular disease, unspecified: Secondary | ICD-10-CM

## 2012-12-19 ENCOUNTER — Encounter: Payer: Self-pay | Admitting: Surgery

## 2012-12-22 ENCOUNTER — Ambulatory Visit (INDEPENDENT_AMBULATORY_CARE_PROVIDER_SITE_OTHER): Payer: Medicare Other | Admitting: Surgery

## 2012-12-22 ENCOUNTER — Ambulatory Visit (HOSPITAL_COMMUNITY)
Admission: RE | Admit: 2012-12-22 | Discharge: 2012-12-22 | Disposition: A | Discharge status: Home / Self Care | Payer: Medicare Other | Source: Ambulatory Visit | Attending: Surgery | Admitting: Surgery

## 2012-12-22 ENCOUNTER — Encounter (INDEPENDENT_AMBULATORY_CARE_PROVIDER_SITE_OTHER): Payer: Self-pay

## 2012-12-22 ENCOUNTER — Encounter: Payer: Self-pay | Admitting: Surgery

## 2012-12-22 VITALS — BP 166/71 | HR 64 | Ht 64.0 in | Wt 133.0 lb

## 2012-12-22 DIAGNOSIS — I739 Peripheral vascular disease, unspecified: Secondary | ICD-10-CM | POA: Insufficient documentation

## 2012-12-22 NOTE — Progress Notes (Signed)
Vascular and Vein Specialist of [facility]   Patient name: [name] MRN: [id number] DOB: 02-Jan-1918 Sex: female   Referred by: Dr. Concepcion Elk  Reason for referral:  Chief Complaint  Patient presents with  . PVD    new pt, Dr. Concepcion Elk     HISTORY OF PRESENT ILLNESS: The patient comes in today for evaluation of peripheral vascular disease.  She reports a stabbing feeling in both lower extremities.  She is taking gabapentin for presumed to neuropathy.  She reports a wound on the medial side of her left leg for several weeks which has nearly healed.  She also reports a new ulcer on the lateral side of her left leg for approximately one week.  The patient does not endorse claudication symptoms.  The patient was seen by Dr. Fredia Sorrow in August for peripheral vascular disease.  It was felt that the patient's symptoms were more attributable to diabetic neuropathy and no intervention was recommended.  Imaging studies at that time revealed no evidence of aortoiliac disease.  It was felt that her disease was mostly tibial.  She did not have rest pain or nonhealing wounds at that time.  The patient has a history of type 2 diabetes.  She is on insulin and oral hypoglycemics.  She has been treated for gout with allopurinol.  She is medically managed for hypercholesterolemia.  She is not on a statin.  Past Medical History  Diagnosis Date  . UTI (urinary tract infection)   . Hypertension   . Diabetes mellitus   . Gout   . High cholesterol   . Complication of anesthesia   . Diabetic neuropathy   . PVD (peripheral vascular disease)   . Carotid artery occlusion   . Arthritis   . Chronic kidney disease   . GERD (gastroesophageal reflux disease)   . Vertigo   . Venous insufficiency   . Mood disorder   . Dysphagia     History reviewed. No pertinent past surgical history.  History   Social History  . Marital Status: Widowed    Spouse Name: N/A    Number of Children: N/A  . Years of  Education: N/A   Occupational History  . Not on file.   Social History Main Topics  . Smoking status: Never Smoker   . Smokeless tobacco: Never Used  . Alcohol Use: No  . Drug Use: No  . Sexual Activity: No   Other Topics Concern  . Not on file   Social History Narrative  . No narrative on file    Family History  Problem Relation Age of Onset  . Heart disease Daughter   . Hypertension Daughter   . Diabetes Son   . Heart disease Son     Allergies as of 77/20/2014  . (No Known Allergies)    Current Outpatient Prescriptions on File Prior to Visit  Medication Sig Dispense Refill  . allopurinol (ZYLOPRIM) 100 MG tablet Take 100 mg by mouth 2 (two) times daily.      Marland Kitchen amLODipine (NORVASC) 10 MG tablet Take 1 tablet (10 mg total) by mouth daily.  30 tablet  3  . aspirin EC 81 MG EC tablet Take 1 tablet (81 mg total) by mouth daily.      . cetirizine (ZYRTEC) 10 MG tablet Take 10 mg by mouth daily.      Marland Kitchen donepezil (ARICEPT) 10 MG tablet Take 10 mg by mouth at bedtime.       . gabapentin (NEURONTIN) 300 MG  capsule Take 300 mg by mouth 3 (three) times daily.       Marland Kitchen glipiZIDE (GLUCOTROL XL) 5 MG 24 hr tablet Take 5 mg by mouth 2 (two) times daily.      Marland Kitchen glycopyrrolate (ROBINUL) 1 MG tablet Take 1 mg by mouth 2 (two) times daily.      Marland Kitchen HYDROcodone-acetaminophen (HYCET) 7.5-325 mg/15 ml solution Take 5 mLs by mouth every 6 (six) hours as needed.  60 mL  0  . insulin aspart (NOVOLOG FLEXPEN) 100 UNIT/ML SOPN FlexPen Inject into the skin 3 (three) times daily with meals. Before meals per sliding scale.      . Insulin Lispro Prot & Lispro (HUMALOG MIX 75/25 KWIKPEN) (75-25) 100 UNIT/ML SUPN Inject 10-20 Units into the skin 2 (two) times daily. Use 20 units every morning and 10 units every evening      . Vortioxetine HBr (BRINTELLIX) 10 MG TABS Take 5 mg by mouth daily.      . Wheat Dextrin (BENEFIBER DRINK MIX) PACK Take 1 packet by mouth daily.      . Multiple Vitamins-Minerals  (CENTRUM SILVER PO) Take by mouth daily.      . prednisoLONE acetate (PRED FORTE) 1 % ophthalmic suspension Place 1 drop into the left eye 4 (four) times daily.       No current facility-administered medications on file prior to visit.     REVIEW OF SYSTEMS: Cardiovascular: No chest pain, chest pressure, palpitations, orthopnea, or dyspnea on exertion.  Positive for pain in legs with walking and leg swelling Pulmonary: No productive cough, asthma or wheezing. Neurologic: No weakness, paresthesias, aphasia, or amaurosis. No dizziness. Hematologic: No bleeding problems or clotting disorders. Musculoskeletal: No joint pain or joint swelling. Gastrointestinal: No blood in stool or hematemesis Genitourinary: No dysuria or hematuria. Psychiatric:: No history of major depression. Integumentary: No rashes or ulcers. Constitutional: No fever or chills.  PHYSICAL EXAMINATION: General: The patient appears their stated age.  Vital signs are BP 166/71  Pulse 64  Ht 5\' 4"  (1.626 m)  Wt 133 lb (60.328 kg)  BMI 22.82 kg/m2  SpO2 96% HEENT:  No gross abnormalities Pulmonary: Respirations are non-labored Abdomen: Soft and non-tender  Musculoskeletal: There are no major deformities.   Neurologic: No focal weakness or paresthesias are detected, Skin: Healed approximately 2 cm ulcer on the medial side of the left leg.  Early ulceration approximately 1.5 cm in the lateral side lead without evidence of infection Psychiatric: The patient has normal affect. Cardiovascular: There is a regular rate and rhythm without significant murmur appreciated.  Diagnostic Studies: Duplex ultrasound was reviewed here.  This shows triphasic waveforms down to the peroneal artery.  The posterior tibial and anterior tibial arteries show possible occlusion.  There is a chronic deep vein thrombosis of the posterior tibial vei  Assessment:  Peripheral vascular disease with early ulceration, left leg Plan: We discussed  possibly proceeding with angiography to better define her anatomy in the left leg and possible intervention, given that she now has early ulceration.  After a lengthy, well informed discussion, in light of the fact that the patient has already healed 1 the wound on the left leg, and given the fact that she is 79 years old, I have recommended to take a conservative approach.  I will plan on having the patient come back in 3 weeks for followup.  If she shows signs of healing the lateral wound on her left leg I would continue with non-interventional treatment.  If it appears that the wound has gotten worse, I would schedule for her angiogram with possible intervention.  I don't have a history of the patient's hypercholesterolemia and attempted trial of statin therapy, however if she is a candidate for statin therapy I would recommend starting 1 now.     Jorge Ny, M.D. [facility] of [place] Office: [phone] Pager:  [phone]

## 2013-01-09 ENCOUNTER — Encounter: Payer: Self-pay | Admitting: Surgery

## 2013-01-12 ENCOUNTER — Encounter: Payer: Self-pay | Admitting: Surgery

## 2013-01-12 ENCOUNTER — Ambulatory Visit (INDEPENDENT_AMBULATORY_CARE_PROVIDER_SITE_OTHER): Payer: Medicare Other | Admitting: Surgery

## 2013-01-12 VITALS — BP 153/72 | HR 71 | Ht 64.0 in | Wt 136.0 lb

## 2013-01-12 DIAGNOSIS — I739 Peripheral vascular disease, unspecified: Secondary | ICD-10-CM

## 2013-01-12 NOTE — Progress Notes (Signed)
Vascular and Vein Specialist of [place]   Patient name: [name] MRN: [id number] DOB: 10/11/1917 Sex: female     Chief Complaint  Patient presents with  . PVD    3 wk f/u     HISTORY OF PRESENT ILLNESS:  the patient is back today for followup.  I saw her 3 weeks ago for evaluation of peripheral vascular disease.  Her biggest complaint was that of neuropathy, however she did report a new ulcer on the lateral side of her left leg.  Duplex ultrasound showed tibial disease, which is not surprising in this patient with diabetes.  I did not feel that improving her blood flow would help her biggest complaint which is that of burning in her legs, however I was concerned about the early ulceration of the left side of her leg.  She is back today for followup and repeat evaluation of the left leg.  She is concerned that the ulcer is getting bigger.  She denies fevers or chills.  Past Medical History  Diagnosis Date  . UTI (urinary tract infection)   . Hypertension   . Diabetes mellitus   . Gout   . High cholesterol   . Complication of anesthesia   . Diabetic neuropathy   . PVD (peripheral vascular disease)   . Carotid artery occlusion   . Arthritis   . Chronic kidney disease   . GERD (gastroesophageal reflux disease)   . Vertigo   . Venous insufficiency   . Mood disorder   . Dysphagia     No past surgical history on file.  History   Social History  . Marital Status: Widowed    Spouse Name: N/A    Number of Children: N/A  . Years of Education: N/A   Occupational History  . Not on file.   Social History Main Topics  . Smoking status: Never Smoker   . Smokeless tobacco: Never Used  . Alcohol Use: No  . Drug Use: No  . Sexual Activity: No   Other Topics Concern  . Not on file   Social History Narrative  . No narrative on file    Family History  Problem Relation Age of Onset  . Heart disease Daughter   . Hypertension Daughter   . Diabetes Son   . Heart  disease Son     Allergies as of 01/12/2013  . (No Known Allergies)    Current Outpatient Prescriptions on File Prior to Visit  Medication Sig Dispense Refill  . allopurinol (ZYLOPRIM) 100 MG tablet Take 100 mg by mouth 2 (two) times daily.      . amLODipine (NORVASC) 10 MG tablet Take 1 tablet (10 mg total) by mouth daily.  30 tablet  3  . aspirin EC 81 MG EC tablet Take 1 tablet (81 mg total) by mouth daily.      . cetirizine (ZYRTEC) 10 MG tablet Take 10 mg by mouth daily.      . donepezil (ARICEPT) 10 MG tablet Take 10 mg by mouth at bedtime.       . furosemide (LASIX) 20 MG tablet Take 1 tablet by mouth daily.      . gabapentin (NEURONTIN) 300 MG capsule Take 300 mg by mouth 3 (three) times daily.       . glipiZIDE (GLUCOTROL XL) 5 MG 24 hr tablet Take 5 mg by mouth 2 (two) times daily.      . glycopyrrolate (ROBINUL) 1 MG tablet Take 1 mg by mouth 2 (  two) times daily.      . HYDROcodone-acetaminophen (HYCET) 7.5-325 mg/15 ml solution Take 5 mLs by mouth every 6 (six) hours as needed.  60 mL  0  . insulin aspart (NOVOLOG FLEXPEN) 100 UNIT/ML SOPN FlexPen Inject into the skin 3 (three) times daily with meals. Before meals per sliding scale.      . Insulin Lispro Prot & Lispro (HUMALOG MIX 75/25 KWIKPEN) (75-25) 100 UNIT/ML SUPN Inject 10-20 Units into the skin 2 (two) times daily. Use 20 units every morning and 10 units every evening      . Multiple Vitamins-Minerals (CENTRUM SILVER PO) Take by mouth daily.      . prednisoLONE acetate (PRED FORTE) 1 % ophthalmic suspension Place 1 drop into the left eye 4 (four) times daily.      . Vortioxetine HBr (BRINTELLIX) 10 MG TABS Take 5 mg by mouth daily.      . Wheat Dextrin (BENEFIBER DRINK MIX) PACK Take 1 packet by mouth daily.       No current facility-administered medications on file prior to visit.     REVIEW OF SYSTEMS: No changes from visit 3 weeks ago  PHYSICAL EXAMINATION:   Vital signs are BP 153/72  Pulse 71  Ht 5' 4"  (1.626 m)  Wt 136 lb (61.689 kg)  BMI 23.33 kg/m2  SpO2 98% General: The patient appears their stated age. HEENT:  No gross abnormalities Pulmonary:  Non labored breathing Musculoskeletal: There are no major deformities. Neurologic: No focal weakness or paresthesias are detected, Skin: The left leg ulcer on the lateral side is superficial but has increased in size, now with a diameter of approximately 5 mm.  There is no surrounding erythema. Psychiatric: The patient has normal affect. Cardiovascular: There is a regular rate and rhythm without significant murmur appreciated.   Diagnostic Studies None  Assessment: Progression of left leg ulcer. Plan: I discussed with the patient and daughter that I feel we should proceed with angiography.  Ultrasound identifies tibial disease.  I recommended evaluating the blood flow to her left leg and intervening if necessary.  I did discuss this with not have any significant impact on her neuropathy.  The reason for doing the angiogram is to evaluate her blood flow so that we can do everything possible to maximize the chance of healing her ulcer.  This is been scheduled for Wednesday, November 19.  V. Wells Brabham IV, M.D. [facility] of [place] Office: [phone] Pager:  [phone]   

## 2013-01-13 ENCOUNTER — Other Ambulatory Visit: Payer: Self-pay

## 2013-01-15 ENCOUNTER — Encounter (HOSPITAL_COMMUNITY): Payer: Self-pay | Admitting: Pharmacy Technician

## 2013-01-26 MED ORDER — SODIUM CHLORIDE 0.9 % IV SOLN
INTRAVENOUS | Status: DC
  Administered 2013-01-27: 08:00:00 via INTRAVENOUS

## 2013-01-27 ENCOUNTER — Emergency Department (HOSPITAL_COMMUNITY)
Admission: RE | Admit: 2013-01-27 | Discharge: 2013-01-27 | Disposition: A | Discharge status: Home / Self Care | Payer: Medicare Other | Source: Ambulatory Visit | Attending: Emergency Medicine | Admitting: Emergency Medicine

## 2013-01-27 ENCOUNTER — Encounter (HOSPITAL_COMMUNITY)
Admission: RE | Disposition: A | Discharge status: Home / Self Care | Payer: Self-pay | Source: Ambulatory Visit | Attending: Emergency Medicine

## 2013-01-27 DIAGNOSIS — Z8639 Personal history of other endocrine, nutritional and metabolic disease: Secondary | ICD-10-CM | POA: Insufficient documentation

## 2013-01-27 DIAGNOSIS — I129 Hypertensive chronic kidney disease with stage 1 through stage 4 chronic kidney disease, or unspecified chronic kidney disease: Secondary | ICD-10-CM | POA: Insufficient documentation

## 2013-01-27 DIAGNOSIS — M129 Arthropathy, unspecified: Secondary | ICD-10-CM | POA: Insufficient documentation

## 2013-01-27 DIAGNOSIS — Z8679 Personal history of other diseases of the circulatory system: Secondary | ICD-10-CM | POA: Insufficient documentation

## 2013-01-27 DIAGNOSIS — I739 Peripheral vascular disease, unspecified: Secondary | ICD-10-CM | POA: Insufficient documentation

## 2013-01-27 DIAGNOSIS — K219 Gastro-esophageal reflux disease without esophagitis: Secondary | ICD-10-CM | POA: Insufficient documentation

## 2013-01-27 DIAGNOSIS — R198 Other specified symptoms and signs involving the digestive system and abdomen: Secondary | ICD-10-CM | POA: Insufficient documentation

## 2013-01-27 DIAGNOSIS — Z862 Personal history of diseases of the blood and blood-forming organs and certain disorders involving the immune mechanism: Secondary | ICD-10-CM | POA: Insufficient documentation

## 2013-01-27 DIAGNOSIS — F39 Unspecified mood [affective] disorder: Secondary | ICD-10-CM | POA: Insufficient documentation

## 2013-01-27 DIAGNOSIS — I872 Venous insufficiency (chronic) (peripheral): Secondary | ICD-10-CM | POA: Insufficient documentation

## 2013-01-27 DIAGNOSIS — K6289 Other specified diseases of anus and rectum: Secondary | ICD-10-CM

## 2013-01-27 DIAGNOSIS — K625 Hemorrhage of anus and rectum: Secondary | ICD-10-CM | POA: Insufficient documentation

## 2013-01-27 DIAGNOSIS — Z7982 Long term (current) use of aspirin: Secondary | ICD-10-CM | POA: Insufficient documentation

## 2013-01-27 DIAGNOSIS — Z794 Long term (current) use of insulin: Secondary | ICD-10-CM | POA: Insufficient documentation

## 2013-01-27 DIAGNOSIS — N189 Chronic kidney disease, unspecified: Secondary | ICD-10-CM | POA: Insufficient documentation

## 2013-01-27 DIAGNOSIS — Z87440 Personal history of urinary (tract) infections: Secondary | ICD-10-CM | POA: Insufficient documentation

## 2013-01-27 DIAGNOSIS — E7800 Pure hypercholesterolemia, unspecified: Secondary | ICD-10-CM | POA: Insufficient documentation

## 2013-01-27 DIAGNOSIS — E119 Type 2 diabetes mellitus without complications: Secondary | ICD-10-CM | POA: Insufficient documentation

## 2013-01-27 DIAGNOSIS — Z79899 Other long term (current) drug therapy: Secondary | ICD-10-CM | POA: Insufficient documentation

## 2013-01-27 LAB — POCT I-STAT, CHEM 8
Calcium, Ion: 1.21 mmol/L (ref 1.13–1.30)
Chloride: 105 mEq/L (ref 96–112)
Creatinine, Ser: 1.2 mg/dL — ABNORMAL HIGH (ref 0.50–1.10)
Glucose, Bld: 207 mg/dL — ABNORMAL HIGH (ref 70–99)
HCT: 40 % (ref 36.0–46.0)
Potassium: 3.8 mEq/L (ref 3.5–5.1)

## 2013-01-27 SURGERY — ABDOMINAL AORTAGRAM
Anesthesia: LOCAL

## 2013-01-27 MED ORDER — FENTANYL CITRATE 0.05 MG/ML IJ SOLN
INTRAMUSCULAR | Status: AC
  Filled 2013-01-27: qty 2

## 2013-01-27 MED ORDER — MIDAZOLAM HCL 2 MG/2ML IJ SOLN
INTRAMUSCULAR | Status: AC
  Filled 2013-01-27: qty 2

## 2013-01-27 NOTE — H&P (View-Only) (Signed)
Vascular and Vein Specialist of [facility]   Patient name: [name] MRN: [id number] DOB: 16-Dec-1917 Sex: female     Chief Complaint  Patient presents with  . PVD    3 wk f/u     HISTORY OF PRESENT ILLNESS:  the patient is back today for followup.  I saw her 3 weeks ago for evaluation of peripheral vascular disease.  Her biggest complaint was that of neuropathy, however she did report a new ulcer on the lateral side of her left leg.  Duplex ultrasound showed tibial disease, which is not surprising in this patient with diabetes.  I did not feel that improving her blood flow would help her biggest complaint which is that of burning in her legs, however I was concerned about the early ulceration of the left side of her leg.  She is back today for followup and repeat evaluation of the left leg.  She is concerned that the ulcer is getting bigger.  She denies fevers or chills.  Past Medical History  Diagnosis Date  . UTI (urinary tract infection)   . Hypertension   . Diabetes mellitus   . Gout   . High cholesterol   . Complication of anesthesia   . Diabetic neuropathy   . PVD (peripheral vascular disease)   . Carotid artery occlusion   . Arthritis   . Chronic kidney disease   . GERD (gastroesophageal reflux disease)   . Vertigo   . Venous insufficiency   . Mood disorder   . Dysphagia     No past surgical history on file.  History   Social History  . Marital Status: Widowed    Spouse Name: N/A    Number of Children: N/A  . Years of Education: N/A   Occupational History  . Not on file.   Social History Main Topics  . Smoking status: Never Smoker   . Smokeless tobacco: Never Used  . Alcohol Use: No  . Drug Use: No  . Sexual Activity: No   Other Topics Concern  . Not on file   Social History Narrative  . No narrative on file    Family History  Problem Relation Age of Onset  . Heart disease Daughter   . Hypertension Daughter   . Diabetes Son   . Heart  disease Son     Allergies as of 01/12/2013  . (No Known Allergies)    Current Outpatient Prescriptions on File Prior to Visit  Medication Sig Dispense Refill  . allopurinol (ZYLOPRIM) 100 MG tablet Take 100 mg by mouth 2 (two) times daily.      Marland Kitchen amLODipine (NORVASC) 10 MG tablet Take 1 tablet (10 mg total) by mouth daily.  30 tablet  3  . aspirin EC 81 MG EC tablet Take 1 tablet (81 mg total) by mouth daily.      . cetirizine (ZYRTEC) 10 MG tablet Take 10 mg by mouth daily.      Marland Kitchen donepezil (ARICEPT) 10 MG tablet Take 10 mg by mouth at bedtime.       . furosemide (LASIX) 20 MG tablet Take 1 tablet by mouth daily.      Marland Kitchen gabapentin (NEURONTIN) 300 MG capsule Take 300 mg by mouth 3 (three) times daily.       Marland Kitchen glipiZIDE (GLUCOTROL XL) 5 MG 24 hr tablet Take 5 mg by mouth 2 (two) times daily.      Marland Kitchen glycopyrrolate (ROBINUL) 1 MG tablet Take 1 mg by mouth 2 (  two) times daily.      Marland Kitchen HYDROcodone-acetaminophen (HYCET) 7.5-325 mg/15 ml solution Take 5 mLs by mouth every 6 (six) hours as needed.  60 mL  0  . insulin aspart (NOVOLOG FLEXPEN) 100 UNIT/ML SOPN FlexPen Inject into the skin 3 (three) times daily with meals. Before meals per sliding scale.      . Insulin Lispro Prot & Lispro (HUMALOG MIX 75/25 KWIKPEN) (75-25) 100 UNIT/ML SUPN Inject 10-20 Units into the skin 2 (two) times daily. Use 20 units every morning and 10 units every evening      . Multiple Vitamins-Minerals (CENTRUM SILVER PO) Take by mouth daily.      . prednisoLONE acetate (PRED FORTE) 1 % ophthalmic suspension Place 1 drop into the left eye 4 (four) times daily.      . Vortioxetine HBr (BRINTELLIX) 10 MG TABS Take 5 mg by mouth daily.      . Wheat Dextrin (BENEFIBER DRINK MIX) PACK Take 1 packet by mouth daily.       No current facility-administered medications on file prior to visit.     REVIEW OF SYSTEMS: No changes from visit 3 weeks ago  PHYSICAL EXAMINATION:   Vital signs are BP 153/72  Pulse 71  Ht 5\' 4"   (1.626 m)  Wt 136 lb (61.689 kg)  BMI 23.33 kg/m2  SpO2 98% General: The patient appears their stated age. HEENT:  No gross abnormalities Pulmonary:  Non labored breathing Musculoskeletal: There are no major deformities. Neurologic: No focal weakness or paresthesias are detected, Skin: The left leg ulcer on the lateral side is superficial but has increased in size, now with a diameter of approximately 5 mm.  There is no surrounding erythema. Psychiatric: The patient has normal affect. Cardiovascular: There is a regular rate and rhythm without significant murmur appreciated.   Diagnostic Studies None  Assessment: Progression of left leg ulcer. Plan: I discussed with the patient and daughter that I feel we should proceed with angiography.  Ultrasound identifies tibial disease.  I recommended evaluating the blood flow to her left leg and intervening if necessary.  I did discuss this with not have any significant impact on her neuropathy.  The reason for doing the angiogram is to evaluate her blood flow so that we can do everything possible to maximize the chance of healing her ulcer.  This is been scheduled for Wednesday, November 19.  Jorge Ny, M.D. [facility] of [place] Office: [phone] Pager:  [phone]

## 2013-01-27 NOTE — Interval H&P Note (Signed)
History and Physical Interval Note:  01/27/2013 11:17 AM  [name]  has presented today for surgery, with the diagnosis of pad with ulcer  The various methods of treatment have been discussed with the patient and family. After consideration of risks, benefits and other options for treatment, the patient has consented to  Procedure(s): ABDOMINAL AORTAGRAM (N/A) as a surgical intervention .  The patient's history has been reviewed, patient examined, no change in status, stable for surgery.  I have reviewed the patient's chart and labs.  Questions were answered to the patient's satisfaction.     Loni Abdon IV, V. WELLS

## 2013-01-27 NOTE — ED Notes (Signed)
Pt to POD E 40 from cath lab.  No report from sending unit, transport tech had no ideal why pt was sent to ED.  Pt advises she was scheduled to have angiogram and couldn't due to bleeding hemorrhoid.  Pt sent to ED for care. Family member at bedside.

## 2013-01-27 NOTE — ED Notes (Signed)
Pt states she was given a cream for hemorrhoids by her doctor; otherwise no problems prior to today.

## 2013-01-27 NOTE — ED Provider Notes (Signed)
CSN: [id number]     Arrival date & time 01/27/13  1200 History   First MD Initiated Contact with Patient 01/27/13 1324     No chief complaint on file.  (Consider location/radiation/quality/duration/timing/severity/associated sxs/prior Treatment) HPI 79 year old female who presents complaining of rectal bleeding. She states that she was in short stay when she had a bowel movement and began having rectal bleeding. She describes it as a large amount of bright red blood (that was painless. She has not had any similar episodes in the past. She put a taper towel between her legs and try to clean up the blood. She feels like it has stopped bleeding. She has not had any similar episodes in the past. She is not on any blood thinners. She has not noticed any pain or masses in the rectal area. She has not had any previous known episodes of bleeding and reports no history of anemia.        Past Medical History  Diagnosis Date  . UTI (urinary tract infection)   . Hypertension   . Diabetes mellitus   . Gout   . High cholesterol   . Complication of anesthesia   . Diabetic neuropathy   . PVD (peripheral vascular disease)   . Carotid artery occlusion   . Arthritis   . Chronic kidney disease   . GERD (gastroesophageal reflux disease)   . Vertigo   . Venous insufficiency   . Mood disorder   . Dysphagia    No past surgical history on file. Family History  Problem Relation Age of Onset  . Heart disease Daughter   . Hypertension Daughter   . Diabetes Son   . Heart disease Son    History  Substance Use Topics  . Smoking status: Never Smoker   . Smokeless tobacco: Never Used  . Alcohol Use: No   OB History   Grav Para Term Preterm Abortions TAB SAB Ect Mult Living                 Review of Systems  All other systems reviewed and are negative.    Allergies  Review of patient's allergies indicates no known allergies.  Home Medications   Current Outpatient Rx  Name  Route  Sig   Dispense  Refill  . allopurinol (ZYLOPRIM) 100 MG tablet   Oral   Take 100 mg by mouth 2 (two) times daily.         Marland Kitchen amLODipine (NORVASC) 10 MG tablet   Oral   Take 1 tablet (10 mg total) by mouth daily.   30 tablet   3   . aspirin EC 81 MG EC tablet   Oral   Take 1 tablet (81 mg total) by mouth daily.         . cetirizine (ZYRTEC) 10 MG tablet   Oral   Take 10 mg by mouth daily.         Marland Kitchen donepezil (ARICEPT) 10 MG tablet   Oral   Take 10 mg by mouth at bedtime.          . furosemide (LASIX) 20 MG tablet   Oral   Take 1 tablet by mouth daily.         Marland Kitchen gabapentin (NEURONTIN) 300 MG capsule   Oral   Take 300 mg by mouth 3 (three) times daily.          Marland Kitchen glipiZIDE (GLUCOTROL XL) 5 MG 24 hr tablet   Oral   Take  5 mg by mouth 2 (two) times daily.         Marland Kitchen glycopyrrolate (ROBINUL) 1 MG tablet   Oral   Take 1 mg by mouth 2 (two) times daily.         Marland Kitchen HYDROcodone-acetaminophen (HYCET) 7.5-325 mg/15 ml solution   Oral   Take 5 mLs by mouth every 6 (six) hours as needed.   60 mL   0   . insulin aspart (NOVOLOG FLEXPEN) 100 UNIT/ML SOPN FlexPen   Subcutaneous   Inject into the skin 3 (three) times daily with meals. Before meals per sliding scale.         . Insulin Lispro Prot & Lispro (HUMALOG MIX 75/25 KWIKPEN) (75-25) 100 UNIT/ML SUPN   Subcutaneous   Inject 10-20 Units into the skin 2 (two) times daily. Use 20 units every morning and 10 units every evening         . Vortioxetine HBr (BRINTELLIX) 10 MG TABS   Oral   Take 5 mg by mouth daily.         . Wheat Dextrin (BENEFIBER DRINK MIX) PACK   Oral   Take 1 packet by mouth daily.          BP 120/81  Pulse 50  Temp(Src) 98.4 F (36.9 C) (Oral)  Resp 16  Ht 5\' 4"  (1.626 m)  Wt 135 lb (61.236 kg)  BMI 23.16 kg/m2  SpO2 97% Physical Exam  Nursing note and vitals reviewed. Constitutional: She appears well-developed and well-nourished.  HENT:  Head: Normocephalic and atraumatic.   Right Ear: External ear normal.  Left Ear: External ear normal.  Mouth/Throat: Oropharynx is clear and moist.  Eyes: Conjunctivae and EOM are normal. Pupils are equal, round, and reactive to light.  Neck: Normal range of motion. Neck supple.  Cardiovascular: Normal rate and regular rhythm.   Pulmonary/Chest: Effort normal.  Abdominal: Soft. Bowel sounds are normal.  Genitourinary: Rectal exam shows mass.  No bleeding noted at rectum.  Rectal tone normal.  Digital exam with mass posterior wall.  Anoscopy performed and posterior mass noted with cauliflower like surface.  No bleeding.   Musculoskeletal: Normal range of motion. She exhibits no edema and no tenderness.  Neurological: She is alert.  Skin: Skin is warm and dry.    ED Course  Procedures (including critical care time) Labs Review Labs Reviewed  POCT I-STAT, CHEM 8 - Abnormal; Notable for the following:    BUN 26 (*)    Creatinine, Ser 1.20 (*)    Glucose, Bld 207 (*)    All other components within normal limits   Imaging Review No results found.  EKG Interpretation   None       MDM  No diagnosis found. Patient's primary care doctor is Dr. Tyson Dense  79 year old woman who presented for lower extremity angiogram and then had rectal bleeding. She is hemodynamically stable here with normal hemoglobin. Rectal exam reveals mass in the rectum that is currently not bleeding. She is followed by Dr. Elnoria Howard, for gastroenterology. She and daughter advised to call Dr. Elnoria Howard for recheck next week. Dr. Myra Gianotti is her vascular surgeon and he is being contacted to allow for rescheduling.  Discussed with DR.Brabham and family will call after Dr. Elnoria Howard clears.   Hilario Quarry, MD 01/27/13 [phone]

## 2013-04-03 ENCOUNTER — Telehealth: Payer: Self-pay | Admitting: *Deleted

## 2013-04-03 NOTE — Telephone Encounter (Signed)
[name], Isaiah SergeSondra called and left a message that her mother was complaining of numbness in her legs.  I called both the cell # and the voice mail was full then I called the home # [phone] and  Left a message to call our office Monday morning first thing as it was 5:15 when I called her or with worsening symptoms take patient to the ED.

## 2013-04-06 ENCOUNTER — Telehealth: Payer: Self-pay | Admitting: *Deleted

## 2013-04-06 NOTE — Telephone Encounter (Signed)
Ms. [name] last seen in office by Dr. Myra GianottiBrabham on 01-12-2013 for PVD with ulceration (left leg).  Pt. was scheduled for abdominal aortogram on 01-27-2013 by Dr. Myra GianottiBrabham  which was cancelled and pt. was to see Dr. Elnoria HowardHung regarding mass evaluation.  Dois DavenportSandra Person (pt.'s daughter) calling this morning to report left leg pain "from the knee up."  Denies left leg swelling, ulceration, discoloration.  States pt. experiences left leg pain at rest and when walking. Reviewed pt.'s symptoms/EPIC chart with Dr. Myra GianottiBrabham who advised to bring pt. In for an office visit with him/no labs needed.  Will have scheduler make appt. for pt to see Dr. Myra GianottiBrabham (next available)/no labs needed and to call Ms. Person with appt. Date and time.

## 2013-04-06 NOTE — Telephone Encounter (Signed)
Spoke with pt daughter Dois DavenportSandra to schedule patient for 04/13/13 at 9:00am. dpm

## 2013-04-10 ENCOUNTER — Encounter: Payer: Self-pay | Admitting: Surgery

## 2013-04-13 ENCOUNTER — Ambulatory Visit (INDEPENDENT_AMBULATORY_CARE_PROVIDER_SITE_OTHER): Payer: Medicare Other | Admitting: Surgery

## 2013-04-13 ENCOUNTER — Encounter: Payer: Self-pay | Admitting: Surgery

## 2013-04-13 VITALS — BP 124/76 | HR 72 | Ht 64.0 in | Wt 134.8 lb

## 2013-04-13 DIAGNOSIS — M79609 Pain in unspecified limb: Secondary | ICD-10-CM

## 2013-04-13 DIAGNOSIS — I739 Peripheral vascular disease, unspecified: Secondary | ICD-10-CM

## 2013-04-13 NOTE — Progress Notes (Signed)
Patient name: [name] MRN: [id number] DOB: [birth year] Sex: female     Chief Complaint  Patient presents with  . Re-evaluation    eval for L leg - pt c/o numbness in L LE X 1 wk     HISTORY OF PRESENT ILLNESS: The patient comes back today for followup.  I initially saw her in the fall of 2014.  She had complaints of leg pain which she described as numbness and a stabbing pain that shoots down her legs.  Ultrasound identified tibial disease which is not surprising giving her diabetes.  I initially felt no intervention was warranted, however the patient did have a ulcer on the left lateral ankle.  When I saw her a second time, the patient felt this was getting bigger and therefore I schedule her for angiography.  When she was placed on the table for angiography, she was found to have an extruding hemorrhoid which was bleeding.  I canceled her procedure and sent her to the emergency department.  She has been undergoing banding for her hemorrhoid.  She is complaining of numbness on the medial left thigh.  She no longer has ulcers on her legs.  They have all healed  Past Medical History  Diagnosis Date  . UTI (urinary tract infection)   . Hypertension   . Diabetes mellitus   . Gout   . High cholesterol   . Complication of anesthesia   . Diabetic neuropathy   . PVD (peripheral vascular disease)   . Carotid artery occlusion   . Arthritis   . Chronic kidney disease   . GERD (gastroesophageal reflux disease)   . Vertigo   . Venous insufficiency   . Mood disorder   . Dysphagia     Past Surgical History  Procedure Laterality Date  . Hemorrhoid banding      History   Social History  . Marital Status: Widowed    Spouse Name: N/A    Number of Children: N/A  . Years of Education: N/A   Occupational History  . Not on file.   Social History Main Topics  . Smoking status: Never Smoker   . Smokeless tobacco: Never Used  . Alcohol Use: No  . Drug Use: No  . Sexual  Activity: No   Other Topics Concern  . Not on file   Social History Narrative  . No narrative on file    Family History  Problem Relation Age of Onset  . Heart disease Daughter   . Hypertension Daughter   . Diabetes Son   . Heart disease Son     Allergies as of 04/13/2013  . (No Known Allergies)    Current Outpatient Prescriptions on File Prior to Visit  Medication Sig Dispense Refill  . allopurinol (ZYLOPRIM) 100 MG tablet Take 100 mg by mouth 2 (two) times daily.      Marland Kitchen. amLODipine (NORVASC) 10 MG tablet Take 1 tablet (10 mg total) by mouth daily.  30 tablet  3  . aspirin EC 81 MG EC tablet Take 1 tablet (81 mg total) by mouth daily.      . cetirizine (ZYRTEC) 10 MG tablet Take 10 mg by mouth daily.      Marland Kitchen. donepezil (ARICEPT) 10 MG tablet Take 10 mg by mouth at bedtime.       . gabapentin (NEURONTIN) 300 MG capsule Take 300 mg by mouth 3 (three) times daily.       Marland Kitchen. glipiZIDE (GLUCOTROL XL) 5  MG 24 hr tablet Take 5 mg by mouth 2 (two) times daily.      Marland Kitchen glycopyrrolate (ROBINUL) 1 MG tablet Take 1 mg by mouth 2 (two) times daily.      . Insulin Lispro Prot & Lispro (HUMALOG MIX 75/25 KWIKPEN) (75-25) 100 UNIT/ML SUPN Inject 10-20 Units into the skin 2 (two) times daily. Use 20 units every morning and 10 units every evening      . Vortioxetine HBr (BRINTELLIX) 10 MG TABS Take 5 mg by mouth daily.      . Wheat Dextrin (BENEFIBER DRINK MIX) PACK Take 1 packet by mouth daily.      . furosemide (LASIX) 20 MG tablet Take 1 tablet by mouth daily.      Marland Kitchen HYDROcodone-acetaminophen (HYCET) 7.5-325 mg/15 ml solution Take 5 mLs by mouth every 6 (six) hours as needed.  60 mL  0  . insulin aspart (NOVOLOG FLEXPEN) 100 UNIT/ML SOPN FlexPen Inject into the skin 3 (three) times daily with meals. Before meals per sliding scale.       No current facility-administered medications on file prior to visit.     REVIEW OF SYSTEMS: Please see history of present illness, otherwise  negative  PHYSICAL EXAMINATION:   Vital signs are BP 124/76  Pulse 72  Ht 5\' 4"  (1.626 m)  Wt 134 lb 12.8 oz (61.145 kg)  BMI 23.13 kg/m2  SpO2 98% General: The patient appears their stated age. HEENT:  No gross abnormalities Pulmonary:  Non labored breathing Musculoskeletal: There are no major deformities. Neurologic: No focal weakness or paresthesias are detected, Skin: There are no ulcer or rashes noted. Psychiatric: The patient has normal affect. Cardiovascular: There is a regular rate and rhythm without significant murmur appreciated.  Pedal pulses are not palpable   Diagnostic Studies None  Assessment: Vascular disease Diabetes Left leg ulcer, now healed Plan: I discussed with the patient and her daughter that the only indication for intervention for her blood flow would be rest pain or ulceration that is not healing, as I feel she has mainly tibial disease.  I do not feel that any of her current symptoms are related to her vascular disease.  I told her to contact me if she develops another wound on either lower extremities.  Otherwise she will see me on an as-needed basis.  Jorge Ny, M.D. [facility] Office: [phone] Pager:  [phone]

## 2013-05-01 ENCOUNTER — Telehealth: Payer: Self-pay

## 2013-05-01 NOTE — Telephone Encounter (Signed)
Phone call from pt's daughter.  Reported pt. has a "raw, thumbprint-sized area on a great toe"; (daughter unable to remember if this is right or left).  Reports "yellow drainage".  Denies fever or chills.  Daughter reported the sore area has been present since 04/24/13; reported that pt. is not sure if she injured her toe, or how it started.  Stated [facility]. has [facility]. [facility] on 05/07/13.  (Last saw Dr. Myra GianottiBrabham 04/13/13 for c/o pain and numbness in legs; no open sores or rest pain at that time.)  Offered an appt. with Dr. Myra GianottiBrabham on 05/04/13 @ 1:30 PM.  Daughter stated she is unable to get pt. to appt. in the afternoons, due to her work schedule.  Offered appt. on 05/08/13 @ 9:20 AM with nurse practitoner.  Daughter advised if symptoms worsen, ie: increased ulceration, odorous drainage, inflammation, or fever/chills, to call office, or have PCP evaluate; if after hours, advised to take to the ER.  Daughter verb. Understanding and agrees.   Daughter was given phone # of [facility], per request.

## 2013-05-05 ENCOUNTER — Encounter (HOSPITAL_COMMUNITY): Payer: Self-pay | Admitting: Emergency Medicine

## 2013-05-05 ENCOUNTER — Emergency Department (HOSPITAL_COMMUNITY)
Admission: EM | Admit: 2013-05-05 | Discharge: 2013-05-05 | Disposition: A | Discharge status: Home / Self Care | Payer: Medicare Other | Attending: Emergency Medicine | Admitting: Emergency Medicine

## 2013-05-05 ENCOUNTER — Emergency Department (HOSPITAL_COMMUNITY): Payer: Medicare Other

## 2013-05-05 DIAGNOSIS — IMO0002 Reserved for concepts with insufficient information to code with codable children: Secondary | ICD-10-CM

## 2013-05-05 DIAGNOSIS — G629 Polyneuropathy, unspecified: Secondary | ICD-10-CM

## 2013-05-05 DIAGNOSIS — Z7982 Long term (current) use of aspirin: Secondary | ICD-10-CM | POA: Insufficient documentation

## 2013-05-05 DIAGNOSIS — I129 Hypertensive chronic kidney disease with stage 1 through stage 4 chronic kidney disease, or unspecified chronic kidney disease: Secondary | ICD-10-CM | POA: Insufficient documentation

## 2013-05-05 DIAGNOSIS — E1149 Type 2 diabetes mellitus with other diabetic neurological complication: Secondary | ICD-10-CM | POA: Insufficient documentation

## 2013-05-05 DIAGNOSIS — M109 Gout, unspecified: Secondary | ICD-10-CM | POA: Insufficient documentation

## 2013-05-05 DIAGNOSIS — L0390 Cellulitis, unspecified: Secondary | ICD-10-CM

## 2013-05-05 DIAGNOSIS — L97509 Non-pressure chronic ulcer of other part of unspecified foot with unspecified severity: Secondary | ICD-10-CM | POA: Insufficient documentation

## 2013-05-05 DIAGNOSIS — E7800 Pure hypercholesterolemia, unspecified: Secondary | ICD-10-CM | POA: Insufficient documentation

## 2013-05-05 DIAGNOSIS — Z87440 Personal history of urinary (tract) infections: Secondary | ICD-10-CM | POA: Insufficient documentation

## 2013-05-05 DIAGNOSIS — N189 Chronic kidney disease, unspecified: Secondary | ICD-10-CM | POA: Insufficient documentation

## 2013-05-05 DIAGNOSIS — Z23 Encounter for immunization: Secondary | ICD-10-CM | POA: Insufficient documentation

## 2013-05-05 DIAGNOSIS — Z8719 Personal history of other diseases of the digestive system: Secondary | ICD-10-CM | POA: Insufficient documentation

## 2013-05-05 DIAGNOSIS — Z8659 Personal history of other mental and behavioral disorders: Secondary | ICD-10-CM | POA: Insufficient documentation

## 2013-05-05 DIAGNOSIS — L03119 Cellulitis of unspecified part of limb: Secondary | ICD-10-CM

## 2013-05-05 DIAGNOSIS — Z794 Long term (current) use of insulin: Secondary | ICD-10-CM | POA: Insufficient documentation

## 2013-05-05 DIAGNOSIS — Z79899 Other long term (current) drug therapy: Secondary | ICD-10-CM | POA: Insufficient documentation

## 2013-05-05 DIAGNOSIS — E1142 Type 2 diabetes mellitus with diabetic polyneuropathy: Secondary | ICD-10-CM | POA: Insufficient documentation

## 2013-05-05 DIAGNOSIS — L02619 Cutaneous abscess of unspecified foot: Secondary | ICD-10-CM | POA: Insufficient documentation

## 2013-05-05 LAB — BASIC METABOLIC PANEL
BUN: 16 mg/dL (ref 6–23)
CO2: 29 mEq/L (ref 19–32)
Calcium: 9.3 mg/dL (ref 8.4–10.5)
Chloride: 102 mEq/L (ref 96–112)
Creatinine, Ser: 0.98 mg/dL (ref 0.50–1.10)
GFR calc Af Amer: 56 mL/min — ABNORMAL LOW (ref >90)
GFR calc non Af Amer: 48 mL/min — ABNORMAL LOW (ref >90)
Glucose, Bld: 174 mg/dL — ABNORMAL HIGH (ref 70–99)
Potassium: 3.9 mEq/L (ref 3.7–5.3)
SODIUM: 143 meq/L (ref 137–147)

## 2013-05-05 LAB — CBC WITH DIFFERENTIAL/PLATELET
BASOS ABS: 0 10*3/uL (ref 0.0–0.1)
Basophils Relative: 0 % (ref 0–1)
Eosinophils Absolute: 0.2 10*3/uL (ref 0.0–0.7)
Eosinophils Relative: 3 % (ref 0–5)
HCT: 39.5 % (ref 36.0–46.0)
Hemoglobin: 12.9 g/dL (ref 12.0–15.0)
LYMPHS PCT: 43 % (ref 12–46)
Lymphs Abs: 2.7 10*3/uL (ref 0.7–4.0)
MCH: 28.7 pg (ref 26.0–34.0)
MCHC: 32.7 g/dL (ref 30.0–36.0)
MCV: 88 fL (ref 78.0–100.0)
Monocytes Absolute: 0.4 10*3/uL (ref 0.1–1.0)
Monocytes Relative: 6 % (ref 3–12)
NEUTROS PCT: 48 % (ref 43–77)
Neutro Abs: 3 10*3/uL (ref 1.7–7.7)
PLATELETS: 212 10*3/uL (ref 150–400)
RBC: 4.49 MIL/uL (ref 3.87–5.11)
RDW: 15 % (ref 11.5–15.5)
WBC: 6.3 10*3/uL (ref 4.0–10.5)

## 2013-05-05 MED ORDER — CEPHALEXIN 500 MG PO CAPS: 500.0000 mg | ORAL_CAPSULE | Freq: Four times a day (QID) | ORAL | Status: DC

## 2013-05-05 MED ORDER — TETANUS-DIPHTH-ACELL PERTUSSIS 5-2.5-18.5 LF-MCG/0.5 IM SUSP
0.5000 mL | Freq: Once | INTRAMUSCULAR | Status: AC
  Administered 2013-05-05: 0.5 mL via INTRAMUSCULAR
  Filled 2013-05-05: qty 0.5

## 2013-05-05 NOTE — Discharge Instructions (Signed)
Please take your antibiotic as prescribed and keep your food clean. Use antibiotic ointment over the sores and wear padded shoes. Do not wear any shoes that will pull against your skin. Please return to the ED immediately if you develop fever, chills, generalized malaise, red streaking up your foot or leg, or new or worsening of your symptoms.   Cellulitis Cellulitis is an infection of the skin and the tissue under the skin. The infected area is usually red and tender. This happens most often in the arms and lower legs. HOME CARE   Take your antibiotic medicine as told. Finish the medicine even if you start to feel better.  Keep the infected arm or leg raised (elevated).  Put a warm cloth on the area up to 4 times per day.  Only take medicines as told by your doctor.  Keep all doctor visits as told. GET HELP RIGHT AWAY IF:   You have a fever.  You feel very sleepy.  You throw up (vomit) or have watery poop (diarrhea).  You feel sick and have muscle aches and pains.  You see red streaks on the skin coming from the infected area.  Your red area gets bigger or turns a dark color.  Your bone or joint under the infected area is painful after the skin heals.  Your infection comes back in the same area or different area.  You have a puffy (swollen) bump in the infected area.  You have new symptoms. MAKE SURE YOU:   Understand these instructions.  Will watch your condition.  Will get help right away if you are not doing well or get worse. Document Released: 08/08/2007 Document Revised: 08/21/2011 Document Reviewed: 05/07/2011 [facility] Patient Information 2014 [place], [place].  Diabetes and Foot Care Diabetes may cause you to have problems because of poor blood supply (circulation) to your feet and legs. This may cause the skin on your feet to become thinner, break easier, and heal more slowly. Your skin may become dry, and the skin may peel and crack. You may also have nerve  damage in your legs and feet causing decreased feeling in them. You may not notice minor injuries to your feet that could lead to infections or more serious problems. Taking care of your feet is one of the most important things you can do for yourself.  HOME CARE INSTRUCTIONS  Wear shoes at all times, even in the house. Do not go barefoot. Bare feet are easily injured.  Check your feet daily for blisters, cuts, and redness. If you cannot see the bottom of your feet, use a mirror or ask someone for help.  Wash your feet with warm water (do not use hot water) and mild soap. Then pat your feet and the areas between your toes until they are completely dry. Do not soak your feet as this can dry your skin.  Apply a moisturizing lotion or petroleum jelly (that does not contain alcohol and is unscented) to the skin on your feet and to dry, brittle toenails. Do not apply lotion between your toes.  Trim your toenails straight across. Do not dig under them or around the cuticle. File the edges of your nails with an emery board or nail file.  Do not cut corns or calluses or try to remove them with medicine.  Wear clean socks or stockings every day. Make sure they are not too tight. Do not wear knee-high stockings since they may decrease blood flow to your legs.  Wear  shoes that fit properly and have enough cushioning. To break in new shoes, wear them for just a few hours a day. This prevents you from injuring your feet. Always look in your shoes before you put them on to be sure there are no objects inside.  Do not cross your legs. This may decrease the blood flow to your feet.  If you find a minor scrape, cut, or break in the skin on your feet, keep it and the skin around it clean and dry. These areas may be cleansed with mild soap and water. Do not cleanse the area with peroxide, alcohol, or iodine.  When you remove an adhesive bandage, be sure not to damage the skin around it.  If you have a wound,  look at it several times a day to make sure it is healing.  Do not use heating pads or hot water bottles. They may burn your skin. If you have lost feeling in your feet or legs, you may not know it is happening until it is too late.  Make sure your health care provider performs a complete foot exam at least annually or more often if you have foot problems. Report any cuts, sores, or bruises to your health care provider immediately. SEEK MEDICAL CARE IF:   You have an injury that is not healing.  You have cuts or breaks in the skin.  You have an ingrown nail.  You notice redness on your legs or feet.  You feel burning or tingling in your legs or feet.  You have pain or cramps in your legs and feet.  Your legs or feet are numb.  Your feet always feel cold. SEEK IMMEDIATE MEDICAL CARE IF:   There is increasing redness, swelling, or pain in or around a wound.  There is a red line that goes up your leg.  Pus is coming from a wound.  You develop a fever or as directed by your health care provider.  You notice a bad smell coming from an ulcer or wound. Document Released: 02/17/2000 Document Revised: 10/22/2012 Document Reviewed: 07/29/2012 [facility] Patient Information 2014 [place].  Diabetic Neuropathy Diabetic neuropathy is a nerve disease or nerve damage that is caused by diabetes mellitus. About half of all people with diabetes mellitus have some form of nerve damage. Nerve damage is more common in those who have had diabetes mellitus for many years and who generally have not had good control of their blood sugar (glucose) level. Diabetic neuropathy is a common complication of diabetes mellitus. There are three more common types of diabetic neuropathy and a fourth type that is less common and less understood:   Peripheral neuropathy This is the most common type of diabetic neuropathy. It causes damage to the nerves of the feet and legs first and then eventually the hands  and arms.The damage affects the ability to sense touch.  Autonomic neuropathy This type causes damage to the autonomic nervous system, which controls the following functions:  Heartbeat.  Body temperature.  Blood pressure.  Urination.  Digestion.  Sweating.  Sexual function.  Focal neuropathy Focal neuropathy can be painful and unpredictable and occurs most often in older adults with diabetes mellitus. It involves a specific nerve or one area and often comes on suddenly. It usually does not cause long-term problems.  Radiculoplexus neuropathy Sometimes called lumbosacral radiculoplexus neuropathy, radiculoplexus neuropathy affects the nerves of the thighs, hips, buttocks, or legs. It is more common in people with type 2 diabetes  mellitus and in older men. It is characterized by debilitating pain, weakness, and atrophy, usually in the thigh muscles. CAUSES  The cause of peripheral, autonomic, and focal neuropathies is diabetes mellitus that is uncontrolled and high glucose levels. The cause of radiculoplexus neuropathy is unknown. However, it is thought to be caused by inflammation related to uncontrolled glucose levels. SIGNS AND SYMPTOMS  Peripheral Neuropathy Peripheral neuropathy develops slowly over time. When the nerves of the feet and legs no longer work there may be:   Burning, stabbing, or aching pain in the legs or feet.  Inability to feel pressure or pain in your feet. This can lead to:  Thick calluses over pressure areas.  Pressure sores.  Ulcers.  Foot deformities.  Reduced ability to feel temperature changes.  Muscle weakness. Autonomic Neuropathy The symptoms of autonomic neuropathy vary depending on which nerves are affected. Symptoms may include:  Problems with digestion, such as:  Feeling sick to your stomach (nausea).  Vomiting.  Bloating.  Constipation.  Diarrhea.  Abdominal pain.  Difficulty with urination. This occurs if you lose your  ability to sense when your bladder is full. Problems include:  Urine leakage (incontinence).  Inability to empty your bladder completely (retention).  Rapid or irregular heartbeat (palpitations).  Blood pressure drops when you stand up (orthostatic hypotension). When you stand up you may feel:  Dizzy.  Weak.  Faint.  In men, inability to attain and maintain an erection.  In women, vaginal dryness and problems with decreased sexual desire and arousal.  Problems with body temperature regulation.  Increased or decreased sweating. Focal Neuropathy  Abnormal eye movements or abnormal alignment of both eyes.  Weakness in the wrist.  Foot drop. This results in an inability to lift the foot properly and abnormal walking or foot movement.  Paralysis on one side of your face (Bell palsy).  Chest or abdominal pain. Radiculoplexus Neuropathy  Sudden, severe pain in your hip, thigh, or buttocks.  Weakness and wasting of thigh muscles.  Difficulty rising from a seated position.  Abdominal swelling.  Unexplained weight loss (usually more than 10 lb [4.5 kg]). DIAGNOSIS  Peripheral Neuropathy Your senses may be tested. Sensory function testing can be done with:  A light touch using a monofilament.  A vibration with tuning fork.  A sharp sensation with a pin prick. Other tests that can help diagnose neuropathy are:  Nerve conduction velocity. This test checks the transmission of an electrical current through a nerve.  Electromyography. This shows how muscles respond to electrical signals transmitted by nearby nerves.  Quantitative sensory testing. This is used to assess how your nerves respond to vibrations and changes in temperature. Autonomic Neuropathy Diagnosis is often based on reported symptoms. Tell your health care provider if you experience:   Dizziness.   Constipation.   Diarrhea.   Inappropriate urination or inability to urinate.   Inability to  get or maintain an erection.  Tests that may be done include:   Electrocardiography or Holter monitor. These are tests that can help show problems with the heart rate or heart rhythm.   An X-ray exam may be done. Focal Neuropathy Diagnosis is made based on your symptoms and what your health care provider finds during your exam. Other tests may be done. They may include:  Nerve conduction velocities. This checks the transmission of electrical current through a nerve.  Electromyography. This shows how muscles respond to electrical signals transmitted by nearby nerves.  Quantitative sensory testing. This test is  used to assess how your nerves respond to vibration and changes in temperature. Radiculoplexus Neuropathy  Often the first thing is to eliminate any other issue or problems that might be the cause, as there is no stick test for diagnosis.  X-ray exam of your spine and lumbar region.  Spinal tap to rule out cancer.  MRI to rule out other lesions. TREATMENT  Once nerve damage occurs, it cannot be reversed. The goal of treatment is to keep the disease or nerve damage from getting worse and affecting more nerve fibers. Controlling your blood glucose level is the key. Most people with radiculoplexus neuropathy see at least a partial improvement over time. You will need to keep your blood glucose and HbA1c levels in the target range determined by your health care provider. Things that help control blood glucose levels include:   Blood glucose monitoring.   Meal planning.   Physical activity.   Diabetes medicine.  Over time, maintaining lower blood glucose levels helps lessen symptoms. Sometimes, prescription pain medicine is needed. HOME CARE INSTRUCTIONS:  Do not smoke.  Keep your blood glucose level in the range that you and your health care provider have determined acceptable for you.  Keep your blood pressure level in the range that you and your health care provider  have determined acceptable for you.  Eat a well-balanced diet.  Be active every day.  Check your feet every day. SEEK MEDICAL CARE IF:   You have burning, stabbing, or aching pain in the legs or feet.  You are unable to feel pressure or pain in your feet.  You develop problems with digestion such as:  Nausea.  Vomiting.  Bloating.  Constipation.  Diarrhea.  Abdominal pain.  You have difficulty with urination, such as:  Incontinence.  Retention.  You have palpitations.  You develop orthostatic hypotension. When you stand up you may feel:  Dizzy.  Weak.  Faint.  You cannot attain and maintain an erection (in men).  You have vaginal dryness and problems with decreased sexual desire and arousal (in women).  You have severe pain in your thighs, legs, or buttocks.  You have unexplained weight loss. Document Released: 04/30/2001 Document Revised: 12/10/2012 Document Reviewed: 07/31/2012 [facility] Patient Information 2014 [place], [place].

## 2013-05-05 NOTE — ED Notes (Signed)
Pt here with ulcer on left foot, hx of DM and PVD. Pt c/o increased pain and tingling to left foot. Pt has appointment to see her vascular doctor in about 2 weeks, tried to get in sooner but unable to. Denies increased drainage from ulcer. Able to ambulate with walker. Nad, skin warm and dry, resp e/u.

## 2013-05-05 NOTE — ED Notes (Signed)
Phlebotomy at bedside.

## 2013-05-05 NOTE — ED Provider Notes (Signed)
Patient with dime-sized ulcer at left great toe. With slight redness at medial aspect the foot. Foot is nontender. Her daughter reports she has had yet some yellow drainage from the wound. Plan oral antibiotics. Topical antibiotics with sterile dressing. She is to be followed by Dr.Nichols at the wound center in 2 days  Doug SouSam Clevie Prout, MD 05/05/13 1545

## 2013-05-05 NOTE — ED Notes (Signed)
Pt presents with Left big toe pain with associated pain and tingling in the left foot.  Pt foot was wrapped with ace bandage with yellow, brown drainage present on dressing.  Pt foot unwrapped and ulcer on the tip and side of toe is present, no bleeding or drainage visible.  Diabetes in hx.  Daughter at bedside.

## 2013-05-05 NOTE — ED Provider Notes (Signed)
CSN: [id number]     Arrival date & time 05/05/13  1141 History   First MD Initiated Contact with Patient 05/05/13 1259     Chief Complaint  Patient presents with  . Foot Pain     (Consider location/radiation/quality/duration/timing/severity/associated sxs/prior Treatment) HPI Comments: Patient is a 80 year old female with history of hypertension, diabetes, high cholesterol, peripheral vascular disease, chronic kidney disease who presents today with left great toe pain. She reports that she has ulcerations on her toe that has been gradually worsening over the past week, or week and a half. She has numbness in her feet bilaterally which causes her not to notice the pain in her feet. She has had an associated discharge from her left great toe. Her daughter reports it is yellow in nature. She sees Dr. Myra GianottiBrabham for her venous insufficiency. Her daughter reports that they have discussed placing a stent in one of her veins in her legs. There is a note in epic from 2/09 where her ulcerations had all healed.   Patient is a 80 y.o. female presenting with lower extremity pain. The history is provided by the patient. No language interpreter was used.  Foot Pain Associated symptoms include numbness. Pertinent negatives include no abdominal pain, chest pain, chills, fever, nausea or vomiting.    Past Medical History  Diagnosis Date  . UTI (urinary tract infection)   . Hypertension   . Diabetes mellitus   . Gout   . High cholesterol   . Complication of anesthesia   . Diabetic neuropathy   . PVD (peripheral vascular disease)   . Carotid artery occlusion   . Arthritis   . Chronic kidney disease   . GERD (gastroesophageal reflux disease)   . Vertigo   . Venous insufficiency   . Mood disorder   . Dysphagia    Past Surgical History  Procedure Laterality Date  . Hemorrhoid banding     Family History  Problem Relation Age of Onset  . Heart disease Daughter   . Hypertension Daughter   . Diabetes  Son   . Heart disease Son    History  Substance Use Topics  . Smoking status: Never Smoker   . Smokeless tobacco: Never Used  . Alcohol Use: No   OB History   Grav Para Term Preterm Abortions TAB SAB Ect Mult Living                 Review of Systems  Constitutional: Negative for fever and chills.  Respiratory: Negative for shortness of breath.   Cardiovascular: Negative for chest pain.  Gastrointestinal: Negative for nausea, vomiting and abdominal pain.  Skin: Positive for wound.  Neurological: Positive for numbness.  All other systems reviewed and are negative.      Allergies  Review of patient's allergies indicates no known allergies.  Home Medications   Current Outpatient Rx  Name  Route  Sig  Dispense  Refill  . allopurinol (ZYLOPRIM) 100 MG tablet   Oral   Take 100 mg by mouth 2 (two) times daily.         Marland Kitchen. amLODipine (NORVASC) 10 MG tablet   Oral   Take 1 tablet (10 mg total) by mouth daily.   30 tablet   3   . aspirin EC 81 MG EC tablet   Oral   Take 1 tablet (81 mg total) by mouth daily.         . cadexomer iodine (IODOSORB) 0.9 % gel   Topical  Apply 1 application topically daily as needed for wound care (to big toe).         . cetirizine (ZYRTEC) 10 MG tablet   Oral   Take 10 mg by mouth daily.         Marland Kitchen donepezil (ARICEPT) 10 MG tablet   Oral   Take 10 mg by mouth at bedtime.          . gabapentin (NEURONTIN) 300 MG capsule   Oral   Take 300 mg by mouth 3 (three) times daily.          Marland Kitchen glipiZIDE (GLUCOTROL XL) 5 MG 24 hr tablet   Oral   Take 5 mg by mouth 2 (two) times daily.         Marland Kitchen glycopyrrolate (ROBINUL) 1 MG tablet   Oral   Take 1 mg by mouth daily as needed (for stomach).          Marland Kitchen HYDROcodone-acetaminophen (NORCO/VICODIN) 5-325 MG per tablet   Oral   Take 1 tablet by mouth every 6 (six) hours as needed for moderate pain.         . Insulin Lispro Prot & Lispro (HUMALOG MIX 75/25 KWIKPEN) (75-25) 100  UNIT/ML SUPN   Subcutaneous   Inject 20-30 Units into the skin 2 (two) times daily. Use 30 units every morning and 20 units every evening         . phenylephrine-shark liver oil-mineral oil-petrolatum (PREPARATION H) 0.25-3-14-71.9 % rectal ointment   Rectal   Place 1 application rectally 2 (two) times daily as needed for hemorrhoids.         . rosuvastatin (CRESTOR) 5 MG tablet   Oral   Take 5 mg by mouth daily.         . Vortioxetine HBr (BRINTELLIX) 10 MG TABS   Oral   Take 5 mg by mouth daily.         . Wheat Dextrin (BENEFIBER DRINK MIX) PACK   Oral   Take 1 packet by mouth daily.          BP 142/73  Pulse 64  Temp(Src) 97.4 F (36.3 C) (Oral)  Resp 18  SpO2 96% Physical Exam  Nursing note and vitals reviewed. Constitutional: She is oriented to person, place, and time. She appears well-developed and well-nourished. No distress.  HENT:  Head: Normocephalic and atraumatic.  Right Ear: External ear normal.  Left Ear: External ear normal.  Nose: Nose normal.  Mouth/Throat: Oropharynx is clear and moist.  Eyes: Conjunctivae are normal.  Neck: Normal range of motion.  Cardiovascular: Normal rate, regular rhythm, normal heart sounds and intact distal pulses.   PT pulse heard by doppler on left. Capillary refill < 3 seconds in all toes  Pulmonary/Chest: Effort normal and breath sounds normal. No stridor. No respiratory distress. She has no wheezes. She has no rales.  Abdominal: Soft. She exhibits no distension.  Musculoskeletal: Normal range of motion.  Neurological: She is alert and oriented to person, place, and time. She has normal strength.  Skin: Skin is warm and dry. She is not diaphoretic. No erythema.  Ulceration to tip of left great toe, surrounding erythema medially  Psychiatric: She has a normal mood and affect. Her behavior is normal.    ED Course  Procedures (including critical care time) Labs Review Labs Reviewed  BASIC METABOLIC PANEL -  Abnormal; Notable for the following:    Glucose, Bld 174 (*)    GFR calc non Af Denyse Dago  48 (*)    GFR calc Af Amer 56 (*)    All other components within normal limits  CBC WITH DIFFERENTIAL   Imaging Review Dg Foot Complete Left  05/05/2013   CLINICAL DATA:  80 year old female with soft tissue wound medial tip left great toe. Pain and infection. Diabetic. Initial encounter.  EXAM: LEFT FOOT - COMPLETE 3+ VIEW  COMPARISON:  None.  FINDINGS: There is a retained metal foreign body situated between the first and second metatarsals, approximately 14 mm in length.  Calcified atherosclerosis in the left foot an ankle. No subcutaneous gas.  Bone mineralization is within normal limits for age. The phalanges of the left great toe appear intact. No cortical osteolysis. Joint spaces are within normal limits for age.  Calcaneus intact.  No acute fracture or dislocation.  IMPRESSION: 1. No acute osseous abnormality in the left foot. No plain radiographic evidence of osteomyelitis. 2. Retained linear metal foreign body between the first and second metatarsals.   Electronically Signed   By: Augusto Gamble M.D.   On: 05/05/2013 14:03      EKG Interpretation None      MDM   Final diagnoses:  Ulceration  Cellulitis  Neuropathy    Patient presents to ED with ulceration of left great toe. XR shows no acute osseous abnormality or evidence of osteomyelitis. Pulses heard by doppler. Capillary refill < 3 seconds in all toes. Sensation is decreased which is chronic for the patient. TDAP updated in ED. Patient is afebrile without systemic signs of illness. Patient will be discharged with keflex and instructions to use bacitracin on ulceration. She has an appointment at the wound care center in [place] in 2 days. Dr. Ethelda Chick evaluated patient and agrees with plan. Strict return instructions given. Vital signs stable for discharge. Patient / Family / Caregiver informed of clinical course, understand medical decision-making  process, and agree with plan.    Mora Bellman, PA-C 05/06/13 [phone]

## 2013-05-07 ENCOUNTER — Encounter: Payer: Self-pay | Admitting: Family

## 2013-05-08 ENCOUNTER — Emergency Department (HOSPITAL_COMMUNITY)
Admission: EM | Admit: 2013-05-08 | Discharge: 2013-05-08 | Disposition: A | Discharge status: Home / Self Care | Payer: Medicare Other | Attending: Emergency Medicine | Admitting: Emergency Medicine

## 2013-05-08 ENCOUNTER — Emergency Department (HOSPITAL_COMMUNITY): Payer: Medicare Other

## 2013-05-08 ENCOUNTER — Ambulatory Visit: Payer: Medicare Other | Admitting: Family

## 2013-05-08 ENCOUNTER — Encounter (HOSPITAL_COMMUNITY): Payer: Self-pay | Admitting: Emergency Medicine

## 2013-05-08 DIAGNOSIS — R0789 Other chest pain: Secondary | ICD-10-CM | POA: Insufficient documentation

## 2013-05-08 DIAGNOSIS — Z79899 Other long term (current) drug therapy: Secondary | ICD-10-CM | POA: Insufficient documentation

## 2013-05-08 DIAGNOSIS — E1149 Type 2 diabetes mellitus with other diabetic neurological complication: Secondary | ICD-10-CM | POA: Insufficient documentation

## 2013-05-08 DIAGNOSIS — E1142 Type 2 diabetes mellitus with diabetic polyneuropathy: Secondary | ICD-10-CM | POA: Insufficient documentation

## 2013-05-08 DIAGNOSIS — Z792 Long term (current) use of antibiotics: Secondary | ICD-10-CM | POA: Insufficient documentation

## 2013-05-08 DIAGNOSIS — N189 Chronic kidney disease, unspecified: Secondary | ICD-10-CM | POA: Insufficient documentation

## 2013-05-08 DIAGNOSIS — Z7982 Long term (current) use of aspirin: Secondary | ICD-10-CM | POA: Insufficient documentation

## 2013-05-08 DIAGNOSIS — E7800 Pure hypercholesterolemia, unspecified: Secondary | ICD-10-CM | POA: Insufficient documentation

## 2013-05-08 DIAGNOSIS — Z87440 Personal history of urinary (tract) infections: Secondary | ICD-10-CM | POA: Insufficient documentation

## 2013-05-08 DIAGNOSIS — I129 Hypertensive chronic kidney disease with stage 1 through stage 4 chronic kidney disease, or unspecified chronic kidney disease: Secondary | ICD-10-CM | POA: Insufficient documentation

## 2013-05-08 DIAGNOSIS — Z794 Long term (current) use of insulin: Secondary | ICD-10-CM | POA: Insufficient documentation

## 2013-05-08 DIAGNOSIS — M109 Gout, unspecified: Secondary | ICD-10-CM | POA: Insufficient documentation

## 2013-05-08 DIAGNOSIS — Z8659 Personal history of other mental and behavioral disorders: Secondary | ICD-10-CM | POA: Insufficient documentation

## 2013-05-08 DIAGNOSIS — M129 Arthropathy, unspecified: Secondary | ICD-10-CM | POA: Insufficient documentation

## 2013-05-08 DIAGNOSIS — Z8719 Personal history of other diseases of the digestive system: Secondary | ICD-10-CM | POA: Insufficient documentation

## 2013-05-08 LAB — BASIC METABOLIC PANEL
BUN: 17 mg/dL (ref 6–23)
CALCIUM: 9.2 mg/dL (ref 8.4–10.5)
CHLORIDE: 99 meq/L (ref 96–112)
CO2: 30 meq/L (ref 19–32)
CREATININE: 0.91 mg/dL (ref 0.50–1.10)
GFR calc Af Amer: 61 mL/min — ABNORMAL LOW (ref >90)
GFR calc non Af Amer: 53 mL/min — ABNORMAL LOW (ref >90)
GLUCOSE: 240 mg/dL — AB (ref 70–99)
Potassium: 3.8 mEq/L (ref 3.7–5.3)
SODIUM: 139 meq/L (ref 137–147)

## 2013-05-08 LAB — CBC
HCT: 36.8 % (ref 36.0–46.0)
HEMOGLOBIN: 12.2 g/dL (ref 12.0–15.0)
MCH: 29.4 pg (ref 26.0–34.0)
MCHC: 33.2 g/dL (ref 30.0–36.0)
MCV: 88.7 fL (ref 78.0–100.0)
PLATELETS: 195 10*3/uL (ref 150–400)
RBC: 4.15 MIL/uL (ref 3.87–5.11)
RDW: 15.4 % (ref 11.5–15.5)
WBC: 8.1 10*3/uL (ref 4.0–10.5)

## 2013-05-08 LAB — I-STAT TROPONIN, ED: Troponin i, poc: 0.01 ng/mL (ref 0.00–0.08)

## 2013-05-08 LAB — TROPONIN I: Troponin I: <0.3 ng/mL (ref <0.30)

## 2013-05-08 MED ORDER — ASPIRIN 81 MG PO CHEW
324.0000 mg | CHEWABLE_TABLET | Freq: Once | ORAL | Status: AC
Start: 2013-05-08 — End: 2013-05-08
  Administered 2013-05-08: 324 mg via ORAL
  Filled 2013-05-08: qty 4

## 2013-05-08 NOTE — Discharge Instructions (Signed)
Chest Pain (Nonspecific) °It is often hard to give a specific diagnosis for the cause of chest pain. There is always a chance that your pain could be related to something serious, such as a heart attack or a blood clot in the lungs. You need to follow up with your caregiver for further evaluation. °CAUSES  °· Heartburn. °· Pneumonia or bronchitis. °· Anxiety or stress. °· Inflammation around your heart (pericarditis) or lung (pleuritis or pleurisy). °· A blood clot in the lung. °· A collapsed lung (pneumothorax). It can develop suddenly on its own (spontaneous pneumothorax) or from injury (trauma) to the chest. °· Shingles infection (herpes zoster virus). °The chest wall is composed of bones, muscles, and cartilage. Any of these can be the source of the pain. °· The bones can be bruised by injury. °· The muscles or cartilage can be strained by coughing or overwork. °· The cartilage can be affected by inflammation and become sore (costochondritis). °DIAGNOSIS  °Lab tests or other studies, such as X-rays, electrocardiography, stress testing, or cardiac imaging, may be needed to find the cause of your pain.  °TREATMENT  °· Treatment depends on what may be causing your chest pain. Treatment may include: °· Acid blockers for heartburn. °· Anti-inflammatory medicine. °· Pain medicine for inflammatory conditions. °· Antibiotics if an infection is present. °· You may be advised to change lifestyle habits. This includes stopping smoking and avoiding alcohol, caffeine, and chocolate. °· You may be advised to keep your head raised (elevated) when sleeping. This reduces the chance of acid going backward from your stomach into your esophagus. °· Most of the time, nonspecific chest pain will improve within 2 to 3 days with rest and mild pain medicine. °HOME CARE INSTRUCTIONS  °· If antibiotics were prescribed, take your antibiotics as directed. Finish them even if you start to feel better. °· For the next few days, avoid physical  activities that bring on chest pain. Continue physical activities as directed. °· Do not smoke. °· Avoid drinking alcohol. °· Only take over-the-counter or prescription medicine for pain, discomfort, or fever as directed by your caregiver. °· Follow your caregiver's suggestions for further testing if your chest pain does not go away. °· Keep any follow-up appointments you made. If you do not go to an appointment, you could develop lasting (chronic) problems with pain. If there is any problem keeping an appointment, you must call to reschedule. °SEEK MEDICAL CARE IF:  °· You think you are having problems from the medicine you are taking. Read your medicine instructions carefully. °· Your chest pain does not go away, even after treatment. °· You develop a rash with blisters on your chest. °SEEK IMMEDIATE MEDICAL CARE IF:  °· You have increased chest pain or pain that spreads to your arm, neck, jaw, back, or abdomen. °· You develop shortness of breath, an increasing cough, or you are coughing up blood. °· You have severe back or abdominal pain, feel nauseous, or vomit. °· You develop severe weakness, fainting, or chills. °· You have a fever. °THIS IS AN EMERGENCY. Do not wait to see if the pain will go away. Get medical help at once. Call your local emergency services (911 in [place] Do not drive yourself to the hospital. °MAKE SURE YOU:  °· Understand these instructions. °· Will watch your condition. °· Will get help right away if you are not doing well or get worse. °Document Released: 11/29/2004 Document Revised: 05/14/2011 Document Reviewed: 09/25/2007 °[employer]® Patient Information ©2014 [employer]   [employer]. ° °

## 2013-05-08 NOTE — ED Provider Notes (Signed)
Medical screening examination/treatment/procedure(s) were conducted as a shared visit with non-physician practitioner(s) and myself.  I personally evaluated the patient during the encounter.   EKG Interpretation None       Doug SouSam Delwin Raczkowski, MD 05/08/13 1455

## 2013-05-08 NOTE — ED Provider Notes (Signed)
CSN: [id number]     Arrival date & time 05/08/13  0238 History   First MD Initiated Contact with Patient 05/08/13 0245     Chief Complaint  Patient presents with  . Chest Pain     (Consider location/radiation/quality/duration/timing/severity/associated sxs/prior Treatment) HPI Patient is a poor historian. Presents with left-sided sharp chest pain starting at midnight that abated shortly after began. She denies any nausea, vomiting, shortness of breath, cough, fever or chills. She's recently been treated for a wound infection of the left foot. Patient says she's had no previous history of coronary artery disease. She is not followed by cardiologist. Past Medical History  Diagnosis Date  . UTI (urinary tract infection)   . Hypertension   . Diabetes mellitus   . Gout   . High cholesterol   . Complication of anesthesia   . Diabetic neuropathy   . PVD (peripheral vascular disease)   . Carotid artery occlusion   . Arthritis   . Chronic kidney disease   . GERD (gastroesophageal reflux disease)   . Vertigo   . Venous insufficiency   . Mood disorder   . Dysphagia    Past Surgical History  Procedure Laterality Date  . Hemorrhoid banding     Family History  Problem Relation Age of Onset  . Heart disease Daughter   . Hypertension Daughter   . Diabetes Son   . Heart disease Son    History  Substance Use Topics  . Smoking status: Never Smoker   . Smokeless tobacco: Never Used  . Alcohol Use: No   OB History   Grav Para Term Preterm Abortions TAB SAB Ect Mult Living                 Review of Systems  Constitutional: Negative for fever and chills.  HENT: Negative for congestion and sore throat.   Respiratory: Negative for cough and shortness of breath.   Cardiovascular: Positive for chest pain.  Gastrointestinal: Negative for nausea, vomiting, abdominal pain, diarrhea and constipation.  Genitourinary: Negative for dysuria.  Musculoskeletal: Negative for back pain, myalgias,  neck pain and neck stiffness.  Skin: Positive for wound. Negative for rash.  Neurological: Negative for dizziness, weakness, light-headedness and numbness.  All other systems reviewed and are negative.      Allergies  Review of patient's allergies indicates no known allergies.  Home Medications   Current Outpatient Rx  Name  Route  Sig  Dispense  Refill  . allopurinol (ZYLOPRIM) 100 MG tablet   Oral   Take 100 mg by mouth 2 (two) times daily.         Marland Kitchen. amLODipine (NORVASC) 10 MG tablet   Oral   Take 1 tablet (10 mg total) by mouth daily.   30 tablet   3   . aspirin EC 81 MG EC tablet   Oral   Take 1 tablet (81 mg total) by mouth daily.         . cadexomer iodine (IODOSORB) 0.9 % gel   Topical   Apply 1 application topically daily as needed for wound care (to big toe).         . cephALEXin (KEFLEX) 500 MG capsule   Oral   Take 1 capsule (500 mg total) by mouth 4 (four) times daily.   40 capsule   0   . cetirizine (ZYRTEC) 10 MG tablet   Oral   Take 10 mg by mouth daily.         .Marland Kitchen  donepezil (ARICEPT) 10 MG tablet   Oral   Take 10 mg by mouth at bedtime.          . gabapentin (NEURONTIN) 300 MG capsule   Oral   Take 300 mg by mouth 3 (three) times daily.          Marland Kitchen glipiZIDE (GLUCOTROL XL) 5 MG 24 hr tablet   Oral   Take 5 mg by mouth 2 (two) times daily.         Marland Kitchen HYDROcodone-acetaminophen (NORCO/VICODIN) 5-325 MG per tablet   Oral   Take 1 tablet by mouth every 6 (six) hours as needed for moderate pain.         . Insulin Lispro Prot & Lispro (HUMALOG MIX 75/25 KWIKPEN) (75-25) 100 UNIT/ML SUPN   Subcutaneous   Inject 20-30 Units into the skin 2 (two) times daily. Use 30 units every morning and 20 units every evening         . phenylephrine-shark liver oil-mineral oil-petrolatum (PREPARATION H) 0.25-3-14-71.9 % rectal ointment   Rectal   Place 1 application rectally 2 (two) times daily as needed for hemorrhoids.         .  rosuvastatin (CRESTOR) 5 MG tablet   Oral   Take 5 mg by mouth daily.         . Vortioxetine HBr (BRINTELLIX) 10 MG TABS   Oral   Take 5 mg by mouth daily.         . Wheat Dextrin (BENEFIBER DRINK MIX) PACK   Oral   Take 1 packet by mouth daily.          BP 138/77  Pulse 62  Temp(Src) 98.6 F (37 C) (Oral)  Resp 19  SpO2 98% Physical Exam  Nursing note and vitals reviewed. Constitutional: She is oriented to person, place, and time. She appears well-developed and well-nourished. No distress.  HENT:  Head: Normocephalic and atraumatic.  Mouth/Throat: Oropharynx is clear and moist.  Eyes: EOM are normal. Pupils are equal, round, and reactive to light.  Neck: Normal range of motion. Neck supple.  Cardiovascular: Normal rate and regular rhythm.   Pulmonary/Chest: Effort normal and breath sounds normal. No respiratory distress. She has no wheezes. She has no rales.  Abdominal: Soft. Bowel sounds are normal. She exhibits no distension and no mass. There is no tenderness. There is no rebound.  Musculoskeletal: Normal range of motion. She exhibits no edema and no tenderness.  No calf tenderness.  Neurological: She is alert and oriented to person, place, and time.  Skin: Skin is warm and dry. No rash noted. No erythema.  Psychiatric: She has a normal mood and affect. Her behavior is normal.    ED Course  Procedures (including critical care time) Labs Review Labs Reviewed  CBC  BASIC METABOLIC PANEL  I-STAT TROPOININ, ED   Imaging Review No results found.   EKG Interpretation   Date/Time:  Friday May 08 2013 02:48:55 EST Ventricular Rate:  64 PR Interval:  205 QRS Duration: 92 QT Interval:  489 QTC Calculation: 505 R Axis:   -56 Text Interpretation:  Sinus rhythm Left anterior fascicular block Consider  left ventricular hypertrophy Borderline T abnormalities, diffuse leads  Prolonged QT interval Confirmed by Ranae Palms  MD, Syeda Prickett ([id number]) on  05/08/2013 3:26:14  AM      MDM   Final diagnoses:  None     Patient fell signs remained stable in the emergency department. She has normal troponin x2. Her EKG is  essentially unchanged. I discussed the patient with Dr. Evie Lacks. He denies any leg the patient in emergency department and remained asymptomatic be discharged home to followup as an outpatient. Patient does ambulate quite well the emergency department and denies chest pain shortness of breath or any other symptoms. Return precautions have been given the patient's voice understanding.  Loren Racer, MD 05/08/13 [phone]

## 2013-05-08 NOTE — ED Notes (Signed)
Left side cp...  It is "hurting."  No sob, no n/v. Did not take ntg. Or asa.

## 2013-05-11 ENCOUNTER — Encounter (HOSPITAL_BASED_OUTPATIENT_CLINIC_OR_DEPARTMENT_OTHER): Payer: Medicare Other

## 2013-05-18 ENCOUNTER — Encounter: Payer: Self-pay | Admitting: Family

## 2013-05-19 ENCOUNTER — Encounter: Payer: Self-pay | Admitting: Family

## 2013-05-19 ENCOUNTER — Ambulatory Visit (INDEPENDENT_AMBULATORY_CARE_PROVIDER_SITE_OTHER): Payer: Medicare Other | Admitting: Family

## 2013-05-19 ENCOUNTER — Other Ambulatory Visit: Payer: Self-pay

## 2013-05-19 VITALS — BP 132/86 | HR 66 | Temp 97.1°F | Resp 18 | Ht 64.0 in | Wt 132.0 lb

## 2013-05-19 DIAGNOSIS — I779 Disorder of arteries and arterioles, unspecified: Secondary | ICD-10-CM

## 2013-05-19 DIAGNOSIS — I743 Embolism and thrombosis of arteries of the lower extremities: Secondary | ICD-10-CM

## 2013-05-19 NOTE — Progress Notes (Signed)
[facility] [place] HISTORY AND PHYSICAL -PAD  History of Present Illness [name] is a 80 y.o. female patient of Dr. Myra Gianotti.   Dr. Myra Gianotti initially saw her in the fall of 2014. She had complaints of leg pain which she described as numbness and a stabbing pain that shoots down her legs. Ultrasound identified tibial disease which is not surprising giving her diabetes. I initially felt no intervention was warranted, however the patient did have a ulcer on the left lateral ankle. When I saw her a second time, the patient felt this was getting bigger and therefore I schedule her for angiography. When she was placed on the table for angiography, she was found to have an extruding hemorrhoid which was bleeding. Dr. Myra Gianotti canceled her procedure and sent her to the emergency department. She has been undergoing banding for her hemorrhoid.  She returns today for a raw area on left great toe with yellow drainage. She does not know how she sustained this injury to her left great to, thinks it might have been there 3-4 weeks. She has an appointment in 2 days with Dr. Tanda Rockers, wound care. Daughter thinks left great toe wound looks improved. Pain is 8/10 in left great toe, is talking prescription analgesics which provides adequate relief. She complains of intermittent  left upper inner thigh pain, at rest and walking. Patient denies chills or fevers. She denies any other non healing ulcers.  Pt has not had previous intervention of any vascular intervention .  The patient reports New Medical or Surgical History: seen for chest pain in [facility] ED on 05/08/2013, advised to follow up with her cardiologist. Daughter states her mother has rare gout flares.  Pt Diabetic: Yes, dtr states blood sugar at home gets as high as 264. Pt smoker: non-smoker  Pt meds include: Statin :Yes ASA: Yes Other anticoagulants/antiplatelets: no  Past Medical History  Diagnosis Date  . UTI (urinary  tract infection)   . Hypertension   . Diabetes mellitus   . Gout   . High cholesterol   . Complication of anesthesia   . Diabetic neuropathy   . PVD (peripheral vascular disease)   . Carotid artery occlusion   . Arthritis   . Chronic kidney disease   . GERD (gastroesophageal reflux disease)   . Vertigo   . Venous insufficiency   . Mood disorder   . Dysphagia     Social History History  Substance Use Topics  . Smoking status: Never Smoker   . Smokeless tobacco: Never Used  . Alcohol Use: No    Family History Family History  Problem Relation Age of Onset  . Heart disease Daughter   . Hypertension Daughter   . Diabetes Son   . Heart disease Son     Past Surgical History  Procedure Laterality Date  . Hemorrhoid banding      No Known Allergies  Current Outpatient Prescriptions  Medication Sig Dispense Refill  . allopurinol (ZYLOPRIM) 100 MG tablet Take 100 mg by mouth 2 (two) times daily.      Marland Kitchen amLODipine (NORVASC) 10 MG tablet Take 1 tablet (10 mg total) by mouth daily.  30 tablet  3  . aspirin EC 81 MG EC tablet Take 1 tablet (81 mg total) by mouth daily.      . cadexomer iodine (IODOSORB) 0.9 % gel Apply 1 application topically daily as needed for wound care (to big toe).      . cephALEXin (KEFLEX) 500 MG  capsule Take 1 capsule (500 mg total) by mouth 4 (four) times daily.  40 capsule  0  . cetirizine (ZYRTEC) 10 MG tablet Take 10 mg by mouth daily.      Marland Kitchen. donepezil (ARICEPT) 10 MG tablet Take 10 mg by mouth at bedtime.       . gabapentin (NEURONTIN) 300 MG capsule Take 300 mg by mouth 3 (three) times daily.       Marland Kitchen. glipiZIDE (GLUCOTROL XL) 5 MG 24 hr tablet Take 5 mg by mouth 2 (two) times daily.      Marland Kitchen. HYDROcodone-acetaminophen (NORCO/VICODIN) 5-325 MG per tablet Take 1 tablet by mouth every 6 (six) hours as needed for moderate pain.      . Insulin Lispro Prot & Lispro (HUMALOG MIX 75/25 KWIKPEN) (75-25) 100 UNIT/ML SUPN Inject 20-30 Units into the skin 2 (two)  times daily. Use 30 units every morning and 20 units every evening      . phenylephrine-shark liver oil-mineral oil-petrolatum (PREPARATION H) 0.25-3-14-71.9 % rectal ointment Place 1 application rectally 2 (two) times daily as needed for hemorrhoids.      . rosuvastatin (CRESTOR) 5 MG tablet Take 5 mg by mouth daily.      . Vortioxetine HBr (BRINTELLIX) 10 MG TABS Take 5 mg by mouth daily.      . Wheat Dextrin (BENEFIBER DRINK MIX) PACK Take 1 packet by mouth daily.       No current facility-administered medications for this visit.    ROS: See HPI for pertinent positives and negatives.   Physical Examination  Filed Vitals:   05/19/13 0919  BP: 132/86  Pulse: 66  Temp: 97.1 F (36.2 C)  Resp: 18   Filed Weights   05/19/13 0919  Weight: 132 lb (59.875 kg)   Body mass index is 22.65 kg/(m^2).  General: A&O x 3, WDWN, . Gait: using walker, slow, deliberate Eyes:no gross abnormality  Pulmonary: CTAB, without wheezes , rales or rhonchi. Cardiac: regular Rythm , with mild detected murmur.         Carotid Bruits Left Right   Negative Negative  Aorta is not palpable. Radial pulses: are 2+ palpable and =                           VASCULAR EXAM: Extremities with ischemic changes small dry gangrenous area left 5th toe, dry non healing wound tip of left great toe.                                                                                                            LE Pulses LEFT RIGHT       FEMORAL   palpable   palpable        POPLITEAL  not palpable   not palpable       POSTERIOR TIBIAL  not palpable   not palpable        DORSALIS PEDIS      ANTERIOR TIBIAL not palpable   not palpable  PERONEAL not Palpable   not Palpable    Abdomen: soft, NT, no masses. Skin: no rashes, no ulcers noted. Musculoskeletal: no muscle wasting or atrophy.  Neurologic: A&O X 3; Appropriate Affect ; SENSATION: normal; MOTOR FUNCTION:  moving all extremities equally, motor  strength 4/5 throughout. Speech is fluent/normal. CN 2-12 intact save for mild hearing loss.  Previous carotid Duplex, September, 2013: No significant extracranial carotid artery stenosis demonstrated. Vertebrals are patent with antegrade flow.  05/05/2013, Left foot x-ray:  1. No acute osseous abnormality in the left foot. No plain  radiographic evidence of osteomyelitis.  2. Retained linear metal foreign body between the first and second  metatarsals.    ASSESSMENT: [name] is a 80 y.o. 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   PLAN:  I discussed in depth with the patient the nature of atherosclerosis, and emphasized the importance of maximal medical management including strict control of blood pressure, blood glucose, and lipid levels, obtaining regular exercise, and continued cessation of smoking.  The patient is aware that without maximal medical management the underlying atherosclerotic disease process will progress, limiting the benefit of any interventions.  Based on the patient's vascular studies and examination, and after discussing with Dr. Hart Rochester, will schedule pt for angiogram with run off, possible intervention, with Dr. Myra Gianotti, 05/26/2013.  The patient was given information about PAD including signs, symptoms, treatment, what symptoms should prompt the patient to seek immediate medical care, and risk reduction measures to take.  Charisse March, RN, MSN, FNP-C [facility] Phone: [phone]  Clinic MD: Early  05/19/2013 9:16 AM

## 2013-05-19 NOTE — Patient Instructions (Addendum)
Peripheral Vascular Disease Peripheral Vascular Disease (PVD), also called Peripheral Arterial Disease (PAD), is a circulation problem caused by cholesterol (atherosclerotic plaque) deposits in the arteries. PVD commonly occurs in the lower extremities (legs) but it can occur in other areas of the body, such as your arms. The cholesterol buildup in the arteries reduces blood flow which can cause pain and other serious problems. The presence of PVD can place a person at risk for Coronary Artery Disease (CAD).  CAUSES  Causes of PVD can be many. It is usually associated with more than one risk factor such as:   High Cholesterol.  Smoking.  Diabetes.  Lack of exercise or inactivity.  High blood pressure (hypertension).  Obesity.  Family history. SYMPTOMS   When the lower extremities are affected, patients with PVD may experience:  Leg pain with exertion or physical activity. This is called INTERMITTENT CLAUDICATION. This may present as cramping or numbness with physical activity. The location of the pain is associated with the level of blockage. For example, blockage at the abdominal level (distal abdominal aorta) may result in buttock or hip pain. Lower leg arterial blockage may result in calf pain.  As PVD becomes more severe, pain can develop with less physical activity.  In people with severe PVD, leg pain may occur at rest.  Other PVD signs and symptoms:  Leg numbness or weakness.  Coldness in the affected leg or foot, especially when compared to the other leg.  A change in leg color.  Patients with significant PVD are more prone to ulcers or sores on toes, feet or legs. These may take longer to heal or may reoccur. The ulcers or sores can become infected.  If signs and symptoms of PVD are ignored, gangrene may occur. This can result in the loss of toes or loss of an entire limb.  Not all leg pain is related to PVD. Other medical conditions can cause leg pain such  as:  Blood clots (embolism) or Deep Vein Thrombosis.  Inflammation of the blood vessels (vasculitis).  Spinal stenosis. DIAGNOSIS  Diagnosis of PVD can involve several different types of tests. These can include:  Pulse Volume Recording Method (PVR). This test is simple, painless and does not involve the use of X-rays. PVR involves measuring and comparing the blood pressure in the arms and legs. An ABI (Ankle-Brachial Index) is calculated. The normal ratio of blood pressures is 1. As this number becomes smaller, it indicates more severe disease.  < 0.95  indicates significant narrowing in one or more leg vessels.  <0.8 there will usually be pain in the foot, leg or buttock with exercise.  <0.4 will usually have pain in the legs at rest.  <0.25  usually indicates limb threatening PVD.  Doppler detection of pulses in the legs. This test is painless and checks to see if you have a pulses in your legs/feet.  A dye or contrast material (a substance that highlights the blood vessels so they show up on x-ray) may be given to help your caregiver better see the arteries for the following tests. The dye is eliminated from your body by the kidney's. Your caregiver may order blood work to check your kidney function and other laboratory values before the following tests are performed:  Magnetic Resonance Angiography (MRA). An MRA is a picture study of the blood vessels and arteries. The MRA machine uses a large magnet to produce images of the blood vessels.  Computed Tomography Angiography (CTA). A CTA is a   specialized x-ray that looks at how the blood flows in your blood vessels. An IV may be inserted into your arm so contrast dye can be injected.  Angiogram. Is a procedure that uses x-rays to look at your blood vessels. This procedure is minimally invasive, meaning a small incision (cut) is made in your groin. A small tube (catheter) is then inserted into the artery of your groin. The catheter is  guided to the blood vessel or artery your caregiver wants to examine. Contrast dye is injected into the catheter. X-rays are then taken of the blood vessel or artery. After the images are obtained, the catheter is taken out. TREATMENT  Treatment of PVD involves many interventions which may include:  Lifestyle changes:  Quitting smoking.  Exercise.  Following a low fat, low cholesterol diet.  Control of diabetes.  Foot care is very important to the PVD patient. Good foot care can help prevent infection.  Medication:  Cholesterol-lowering medicine.  Blood pressure medicine.  Anti-platelet drugs.  Certain medicines may reduce symptoms of Intermittent Claudication.  Interventional/Surgical options:  Angioplasty. An Angioplasty is a procedure that inflates a balloon in the blocked artery. This opens the blocked artery to improve blood flow.  Stent Implant. A wire mesh tube (stent) is placed in the artery. The stent expands and stays in place, allowing the artery to remain open.  Peripheral Bypass Surgery. This is a surgical procedure that reroutes the blood around a blocked artery to help improve blood flow. This type of procedure may be performed if Angioplasty or stent implants are not an option. SEEK IMMEDIATE MEDICAL CARE IF:   You develop pain or numbness in your arms or legs.  Your arm or leg turns cold, becomes blue in color.  You develop redness, warmth, swelling and pain in your arms or legs. MAKE SURE YOU:   Understand these instructions.  Will watch your condition.  Will get help right away if you are not doing well or get worse. Document Released: 03/29/2004 Document Revised: 05/14/2011 Document Reviewed: 02/24/2008 [facility] Patient Information 2014 [place], [place].   Angiography Angiography is a procedure used to look at the blood vessels that carry blood to different parts of your body (arteries). In this procedure, dye is injected through a long, thin  tube (catheter) into an artery. X-rays are then taken. The X-rays will show if there is a blockage or problem in a blood vessel.  LET [facility] CARE PROVIDER KNOW ABOUT:  Any allergies you have, including allergies to shellfish or contrast dye.   All medicines you are taking, including vitamins, herbs, eye drops, creams, and over-the-counter medicines.   Previous problems you or members of your family have had with the use of anesthetics.   Any blood disorders you have.   Previous surgeries you have had.  Any previous kidney problems or failure you have had.  Medical conditions you have.   Possibility of pregnancy, if this applies. RISKS AND COMPLICATIONS Generally, angiography is a safe procedure. However, as with any procedure, complications can occur. Possible complications include:  Injury to the blood vessels, including rupture or bleeding.  Infection or bruising at the catheter site.  Allergic reaction to the dye or contrast used.  Kidney damage from the dye or contrast used.  Blood clots that can lead to a stroke or heart attack. BEFORE THE PROCEDURE  Do not eat or drink after midnight on the night before the procedure, or as directed by your health care provider.  Ask your health care provider if you may drink enough water to take any needed medicines the morning of the procedure.  PROCEDURE  You may be given a medicine to help you relax (sedative) before and during the procedure. This medicine is given through an IV access tube that is inserted into one of your veins.   The area where the catheter will be inserted will be washed and shaved. This is usually done in the groin but may be done in the fold of your arm (near your elbow) or in the wrist.  A medicine will be given to numb the area where the catheter will be inserted (local anesthetic).  The catheter will be inserted with a guide wire into an artery. The catheter is guided by using a type of  X-ray (fluoroscopy) to the blood vessel being examined.   Dye is then injected into the catheter, and X-rays are taken. The dye helps to show where any narrowing or blockages are located.  AFTER THE PROCEDURE   If the procedure is done through the leg, you will be kept in bed lying flat for several hours. You will be instructed to not bend or cross your legs.  The insertion site will be checked frequently.  The pulse in your feet or wrist will be checked frequently.  Additional blood tests, X-rays, and electrocardiography may be done.   You may need to stay in the hospital overnight for observation.  Document Released: 11/29/2004 Document Revised: 10/22/2012 Document Reviewed: 07/23/2012 [facility] Patient Information 2014 [place].

## 2013-05-22 ENCOUNTER — Encounter (HOSPITAL_COMMUNITY): Payer: Self-pay

## 2013-05-25 MED ORDER — SODIUM CHLORIDE 0.9 % IV SOLN
INTRAVENOUS | Status: DC
  Administered 2013-05-26: 06:00:00 via INTRAVENOUS

## 2013-05-26 ENCOUNTER — Encounter (HOSPITAL_COMMUNITY)
Admission: RE | Disposition: A | Discharge status: Home / Self Care | Payer: Self-pay | Source: Ambulatory Visit | Attending: Surgery

## 2013-05-26 ENCOUNTER — Ambulatory Visit (HOSPITAL_COMMUNITY)
Admission: RE | Admit: 2013-05-26 | Discharge: 2013-05-26 | Disposition: A | Discharge status: Home / Self Care | Payer: Medicare Other | Source: Ambulatory Visit | Attending: Surgery | Admitting: Surgery

## 2013-05-26 ENCOUNTER — Other Ambulatory Visit: Payer: Self-pay | Admitting: *Deleted

## 2013-05-26 ENCOUNTER — Telehealth: Payer: Self-pay | Admitting: Surgery

## 2013-05-26 DIAGNOSIS — I872 Venous insufficiency (chronic) (peripheral): Secondary | ICD-10-CM | POA: Insufficient documentation

## 2013-05-26 DIAGNOSIS — L97509 Non-pressure chronic ulcer of other part of unspecified foot with unspecified severity: Secondary | ICD-10-CM | POA: Insufficient documentation

## 2013-05-26 DIAGNOSIS — K219 Gastro-esophageal reflux disease without esophagitis: Secondary | ICD-10-CM | POA: Insufficient documentation

## 2013-05-26 DIAGNOSIS — I739 Peripheral vascular disease, unspecified: Secondary | ICD-10-CM | POA: Diagnosis present

## 2013-05-26 DIAGNOSIS — I129 Hypertensive chronic kidney disease with stage 1 through stage 4 chronic kidney disease, or unspecified chronic kidney disease: Secondary | ICD-10-CM | POA: Diagnosis not present

## 2013-05-26 DIAGNOSIS — E1142 Type 2 diabetes mellitus with diabetic polyneuropathy: Secondary | ICD-10-CM | POA: Diagnosis not present

## 2013-05-26 DIAGNOSIS — L98499 Non-pressure chronic ulcer of skin of other sites with unspecified severity: Secondary | ICD-10-CM | POA: Diagnosis not present

## 2013-05-26 DIAGNOSIS — Z7982 Long term (current) use of aspirin: Secondary | ICD-10-CM | POA: Insufficient documentation

## 2013-05-26 DIAGNOSIS — N189 Chronic kidney disease, unspecified: Secondary | ICD-10-CM | POA: Insufficient documentation

## 2013-05-26 DIAGNOSIS — E1149 Type 2 diabetes mellitus with other diabetic neurological complication: Secondary | ICD-10-CM | POA: Diagnosis not present

## 2013-05-26 DIAGNOSIS — Z794 Long term (current) use of insulin: Secondary | ICD-10-CM | POA: Diagnosis not present

## 2013-05-26 HISTORY — PX: LOWER EXTREMITY ANGIOGRAM: SHX5508

## 2013-05-26 LAB — POCT I-STAT, CHEM 8
BUN: 29 mg/dL — AB (ref 6–23)
CHLORIDE: 100 meq/L (ref 96–112)
CREATININE: 1.3 mg/dL — AB (ref 0.50–1.10)
Calcium, Ion: 1.2 mmol/L (ref 1.13–1.30)
Glucose, Bld: 304 mg/dL — ABNORMAL HIGH (ref 70–99)
HCT: 43 % (ref 36.0–46.0)
Hemoglobin: 14.6 g/dL (ref 12.0–15.0)
POTASSIUM: 4 meq/L (ref 3.7–5.3)
SODIUM: 140 meq/L (ref 137–147)
TCO2: 29 mmol/L (ref 0–100)

## 2013-05-26 LAB — GLUCOSE, CAPILLARY: GLUCOSE-CAPILLARY: 226 mg/dL — AB (ref 70–99)

## 2013-05-26 LAB — POCT ACTIVATED CLOTTING TIME: Activated Clotting Time: 215 seconds

## 2013-05-26 SURGERY — ANGIOGRAM, LOWER EXTREMITY
Anesthesia: LOCAL

## 2013-05-26 MED ORDER — SODIUM CHLORIDE 0.9 % IV SOLN: 1.0000 mL/kg/h | INTRAVENOUS | Status: DC

## 2013-05-26 MED ORDER — METOPROLOL TARTRATE 1 MG/ML IV SOLN: 2.0000 mg | INTRAVENOUS | Status: DC | PRN

## 2013-05-26 MED ORDER — FENTANYL CITRATE 0.05 MG/ML IJ SOLN
INTRAMUSCULAR | Status: AC
  Filled 2013-05-26: qty 2

## 2013-05-26 MED ORDER — HYDRALAZINE HCL 20 MG/ML IJ SOLN
10.0000 mg | INTRAMUSCULAR | Status: DC | PRN
Start: 2013-05-26 — End: 2013-05-28

## 2013-05-26 MED ORDER — ACETAMINOPHEN 325 MG RE SUPP: 325.0000 mg | RECTAL | Status: DC | PRN

## 2013-05-26 MED ORDER — LIDOCAINE HCL (PF) 1 % IJ SOLN
INTRAMUSCULAR | Status: AC
  Filled 2013-05-26: qty 30

## 2013-05-26 MED ORDER — HEPARIN SODIUM (PORCINE) 1000 UNIT/ML IJ SOLN
INTRAMUSCULAR | Status: AC
  Filled 2013-05-26: qty 1

## 2013-05-26 MED ORDER — PHENOL 1.4 % MT LIQD: 1.0000 | OROMUCOSAL | Status: DC | PRN

## 2013-05-26 MED ORDER — ONDANSETRON HCL 4 MG/2ML IJ SOLN: 4.0000 mg | Freq: Four times a day (QID) | INTRAMUSCULAR | Status: DC | PRN

## 2013-05-26 MED ORDER — HEPARIN (PORCINE) IN NACL 2-0.9 UNIT/ML-% IJ SOLN
INTRAMUSCULAR | Status: AC
  Filled 2013-05-26: qty 1000

## 2013-05-26 MED ORDER — GUAIFENESIN-DM 100-10 MG/5ML PO SYRP: 15.0000 mL | ORAL_SOLUTION | ORAL | Status: DC | PRN

## 2013-05-26 MED ORDER — ACETAMINOPHEN 325 MG PO TABS: 325.0000 mg | ORAL_TABLET | ORAL | Status: DC | PRN

## 2013-05-26 MED ORDER — TRAMADOL HCL 50 MG PO TABS: 50.0000 mg | ORAL_TABLET | Freq: Four times a day (QID) | ORAL | Status: DC | PRN

## 2013-05-26 MED ORDER — ALUM & MAG HYDROXIDE-SIMETH 200-200-20 MG/5ML PO SUSP: 15.0000 mL | ORAL | Status: DC | PRN

## 2013-05-26 MED ORDER — LABETALOL HCL 5 MG/ML IV SOLN: 10.0000 mg | INTRAVENOUS | Status: DC | PRN

## 2013-05-26 NOTE — Discharge Instructions (Signed)
Angiography, Care After ° °Refer to this sheet in the next few weeks. These instructions provide you with information on caring for yourself after your procedure. Your health care provider may also give you more specific instructions. Your treatment has been planned according to current medical practices, but problems sometimes occur. Call your health care provider if you have any problems or questions after your procedure.  °WHAT TO EXPECT AFTER THE PROCEDURE °After your procedure, it is typical to have the following sensations: °· Minor discomfort or tenderness and a small bump at the catheter insertion site. The bump should usually decrease in size and tenderness within 1 to 2 weeks. °· Any bruising will usually fade within 2 to 4 weeks. °HOME CARE INSTRUCTIONS  °· You may need to keep taking blood thinners if they were prescribed for you. Only take over-the-counter or prescription medicines for pain, fever, or discomfort as directed by your health care provider. °· Do not apply powder or lotion to the site. °· Do not sit in a bathtub, swimming pool, or whirlpool for 5 to 7 days. °· You may shower 24 hours after the procedure. Remove the bandage (dressing) and gently wash the site with plain soap and water. Gently pat the site dry. °· Inspect the site at least twice daily. °· Limit your activity for the first 24 hours. Do not bend, squat, or lift anything over 10 lb (9 kg) or as directed by your health care provider. °· Do not drive home if you are discharged the day of the procedure. Have someone else drive you. Follow instructions about when you can drive or return to work. °SEEK MEDICAL CARE IF: °· You get lightheaded when standing up. °· You have drainage (other than a small amount of blood on the dressing). °· You have chills. °· You have a fever. °· You have redness, warmth, swelling, or pain at the insertion site. °SEEK IMMEDIATE MEDICAL CARE IF:  °· You develop chest pain or shortness of breath, feel  faint, or pass out. °· You have bleeding, swelling larger than a walnut, or drainage from the catheter insertion site. °· You develop pain, discoloration, coldness, or severe bruising in the leg or arm that held the catheter. °· You have heavy bleeding from the site. If this happens, hold pressure on the site. °MAKE SURE YOU: °· Understand these instructions. °· Will watch your condition. °· Will get help right away if you are not doing well or get worse. °Document Released: 09/07/2004 Document Revised: 10/22/2012 Document Reviewed: 07/14/2012 °[employer]® Patient Information ©2014 [employer]. ° °

## 2013-05-26 NOTE — Op Note (Signed)
Patient name: [name] MRN: [id number] DOB: [birth year] Sex: female  05/26/2013 Pre-operative Diagnosis: Left foot ulcer Post-operative diagnosis:  Same Surgeon:  Jorge Ny Procedure Performed:  1.  ultrasound-guided access, right femoral artery  2.  abdominal aortogram  3.  bilateral lower extremity runoff  4.  additional order catheterization (left peroneal artery  5.  angioplasty left peroneal artery  6.  followup x2  7.  closure device     Indications:  The patient has a nonhealing left great toe ulcer that did not heal with conservative therapy.  She now comes in for angiography and possible intervention.  Procedure:  The patient was identified in the holding area and taken to room 8.  The patient was then placed supine on the table and prepped and draped in the usual sterile fashion.  A time out was called.  Ultrasound was used to evaluate the right common femoral artery.  It was patent .  A digital ultrasound image was acquired.  A micropuncture needle was used to access the right common femoral artery under ultrasound guidance.  An 018 wire was advanced without resistance and a micropuncture sheath was placed.  The 018 wire was removed and a benson wire was placed.  The micropuncture sheath was exchanged for a 5 french sheath.  An omniflush catheter was advanced over the wire to the level of L-1.  An abdominal angiogram was obtained.  Next, using the omniflush catheter and a benson wire, the aortic bifurcation was crossed and the catheter was placed into theleft external iliac artery and left runoff was obtained.  right runoff was performed via retrograde sheath injections.  Findings:   Aortogram:  No significant stenosis is identified within the suprarenal aorta.  No evidence of renal artery stenosis.  The infrarenal abdominal aorta is tortuous but widely patent.  Bilateral common, external, and internal iliac arteries are widely patent.  Right Lower Extremity:   Ltd. evaluation of the right leg was obtained via retrograde sheath injections to evaluate for closure device.  The common femoral artery and proximal profunda and superficial femoral arteries are widely patent.  Left Lower Extremity:  The left common femoral and profunda femoral artery are widely patent.  The left superficial femoral artery is widely patent.  There is calcification within the adductor canal but no stenosis.  The popliteal artery is patent throughout it's course.  There is single vessel runoff via the peroneal artery which collateralizes and reconstitutes the pedal arch.  There is greater than 80% stenosis within the proximal peroneal artery.  Intervention:  After the above images were acquired, the decision was made to proceed with intervention.  A 6 French Ansel 1 sheath was advanced over the bifurcation and placed into the left superficial femoral artery.  The patient was fully heparinized.  Next using a 014 cougar wire, with the support of a 6 x 80 chocolate balloon, the stenosis in the peroneal artery was crossed.  Primary balloon angioplasty was performed within the tibioperoneal trunk and the peroneal artery.  The balloon was taken to nominal pressure and held for 2 minutes.  Completion imaging revealed residual disease distal to the treated area.  I then reinserted the balloon and performed balloon angioplasty, going more distal than the previously treated area.  The balloon was taken to nominal pressure and held up for 2 minutes.  Completion angiography revealed significantly improved blood flow across the area with no change in distal runoff.  At this point catheters  and wires were removed.  The sheath was removed by closing the groin using a pro-glide device.  Impression:  #1  successful balloon angioplasty of a high grade, greater than 80% stenosis within the peroneal artery using a 3 mm chocolate balloon  #2  reconstitution of the anterior tibial and posterior tibial artery are  from the peroneal artery at the ankle.  The plantar arch is intact.  #3   No significant aortoiliac occlusive disease    V. Durene CalWells Jarrel Knoke, M.D. [facility] [place] Office: [phone] Pager:  [phone]

## 2013-05-26 NOTE — H&P (View-Only) (Signed)
[facility] [place] HISTORY AND PHYSICAL -PAD  History of Present Illness [name] is a 80 y.o. female patient of Dr. Myra Gianotti.   Dr. Myra Gianotti initially saw her in the fall of 2014. She had complaints of leg pain which she described as numbness and a stabbing pain that shoots down her legs. Ultrasound identified tibial disease which is not surprising giving her diabetes. I initially felt no intervention was warranted, however the patient did have a ulcer on the left lateral ankle. When I saw her a second time, the patient felt this was getting bigger and therefore I schedule her for angiography. When she was placed on the table for angiography, she was found to have an extruding hemorrhoid which was bleeding. Dr. Myra Gianotti canceled her procedure and sent her to the emergency department. She has been undergoing banding for her hemorrhoid.  She returns today for a raw area on left great toe with yellow drainage. She does not know how she sustained this injury to her left great to, thinks it might have been there 3-4 weeks. She has an appointment in 2 days with Dr. Tanda Rockers, wound care. Daughter thinks left great toe wound looks improved. Pain is 8/10 in left great toe, is talking prescription analgesics which provides adequate relief. She complains of intermittent  left upper inner thigh pain, at rest and walking. Patient denies chills or fevers. She denies any other non healing ulcers.  Pt has not had previous intervention of any vascular intervention .  The patient reports New Medical or Surgical History: seen for chest pain in [facility] ED on 05/08/2013, advised to follow up with her cardiologist. Daughter states her mother has rare gout flares.  Pt Diabetic: Yes, dtr states blood sugar at home gets as high as 264. Pt smoker: non-smoker  Pt meds include: Statin :Yes ASA: Yes Other anticoagulants/antiplatelets: no  Past Medical History  Diagnosis Date  . UTI (urinary  tract infection)   . Hypertension   . Diabetes mellitus   . Gout   . High cholesterol   . Complication of anesthesia   . Diabetic neuropathy   . PVD (peripheral vascular disease)   . Carotid artery occlusion   . Arthritis   . Chronic kidney disease   . GERD (gastroesophageal reflux disease)   . Vertigo   . Venous insufficiency   . Mood disorder   . Dysphagia     Social History History  Substance Use Topics  . Smoking status: Never Smoker   . Smokeless tobacco: Never Used  . Alcohol Use: No    Family History Family History  Problem Relation Age of Onset  . Heart disease Daughter   . Hypertension Daughter   . Diabetes Son   . Heart disease Son     Past Surgical History  Procedure Laterality Date  . Hemorrhoid banding      No Known Allergies  Current Outpatient Prescriptions  Medication Sig Dispense Refill  . allopurinol (ZYLOPRIM) 100 MG tablet Take 100 mg by mouth 2 (two) times daily.      Marland Kitchen amLODipine (NORVASC) 10 MG tablet Take 1 tablet (10 mg total) by mouth daily.  30 tablet  3  . aspirin EC 81 MG EC tablet Take 1 tablet (81 mg total) by mouth daily.      . cadexomer iodine (IODOSORB) 0.9 % gel Apply 1 application topically daily as needed for wound care (to big toe).      . cephALEXin (KEFLEX) 500 MG  capsule Take 1 capsule (500 mg total) by mouth 4 (four) times daily.  40 capsule  0  . cetirizine (ZYRTEC) 10 MG tablet Take 10 mg by mouth daily.      Marland Kitchen. donepezil (ARICEPT) 10 MG tablet Take 10 mg by mouth at bedtime.       . gabapentin (NEURONTIN) 300 MG capsule Take 300 mg by mouth 3 (three) times daily.       Marland Kitchen. glipiZIDE (GLUCOTROL XL) 5 MG 24 hr tablet Take 5 mg by mouth 2 (two) times daily.      Marland Kitchen. HYDROcodone-acetaminophen (NORCO/VICODIN) 5-325 MG per tablet Take 1 tablet by mouth every 6 (six) hours as needed for moderate pain.      . Insulin Lispro Prot & Lispro (HUMALOG MIX 75/25 KWIKPEN) (75-25) 100 UNIT/ML SUPN Inject 20-30 Units into the skin 2 (two)  times daily. Use 30 units every morning and 20 units every evening      . phenylephrine-shark liver oil-mineral oil-petrolatum (PREPARATION H) 0.25-3-14-71.9 % rectal ointment Place 1 application rectally 2 (two) times daily as needed for hemorrhoids.      . rosuvastatin (CRESTOR) 5 MG tablet Take 5 mg by mouth daily.      . Vortioxetine HBr (BRINTELLIX) 10 MG TABS Take 5 mg by mouth daily.      . Wheat Dextrin (BENEFIBER DRINK MIX) PACK Take 1 packet by mouth daily.       No current facility-administered medications for this visit.    ROS: See HPI for pertinent positives and negatives.   Physical Examination  Filed Vitals:   05/19/13 0919  BP: 132/86  Pulse: 66  Temp: 97.1 F (36.2 C)  Resp: 18   Filed Weights   05/19/13 0919  Weight: 132 lb (59.875 kg)   Body mass index is 22.65 kg/(m^2).  General: A&O x 3, WDWN, . Gait: using walker, slow, deliberate Eyes:no gross abnormality  Pulmonary: CTAB, without wheezes , rales or rhonchi. Cardiac: regular Rythm , with mild detected murmur.         Carotid Bruits Left Right   Negative Negative  Aorta is not palpable. Radial pulses: are 2+ palpable and =                           VASCULAR EXAM: Extremities with ischemic changes small dry gangrenous area left 5th toe, dry non healing wound tip of left great toe.                                                                                                            LE Pulses LEFT RIGHT       FEMORAL   palpable   palpable        POPLITEAL  not palpable   not palpable       POSTERIOR TIBIAL  not palpable   not palpable        DORSALIS PEDIS      ANTERIOR TIBIAL not palpable   not palpable  PERONEAL not Palpable   not Palpable    Abdomen: soft, NT, no masses. Skin: no rashes, no ulcers noted. Musculoskeletal: no muscle wasting or atrophy.  Neurologic: A&O X 3; Appropriate Affect ; SENSATION: normal; MOTOR FUNCTION:  moving all extremities equally, motor  strength 4/5 throughout. Speech is fluent/normal. CN 2-12 intact save for mild hearing loss.  Previous carotid Duplex, September, 2013: No significant extracranial carotid artery stenosis demonstrated. Vertebrals are patent with antegrade flow.  05/05/2013, Left foot x-ray:  1. No acute osseous abnormality in the left foot. No plain  radiographic evidence of osteomyelitis.  2. Retained linear metal foreign body between the first and second  metatarsals.    ASSESSMENT: [name] is a 80 y.o. 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   PLAN:  I discussed in depth with the patient the nature of atherosclerosis, and emphasized the importance of maximal medical management including strict control of blood pressure, blood glucose, and lipid levels, obtaining regular exercise, and continued cessation of smoking.  The patient is aware that without maximal medical management the underlying atherosclerotic disease process will progress, limiting the benefit of any interventions.  Based on the patient's vascular studies and examination, and after discussing with Dr. Hart Rochester, will schedule pt for angiogram with run off, possible intervention, with Dr. Myra Gianotti, 05/26/2013.  The patient was given information about PAD including signs, symptoms, treatment, what symptoms should prompt the patient to seek immediate medical care, and risk reduction measures to take.  Charisse March, RN, MSN, FNP-C [facility] [place] Phone: [phone]  Clinic MD: Early  05/19/2013 9:16 AM

## 2013-05-26 NOTE — Telephone Encounter (Addendum)
Message copied by Fredrich BirksMILLIKAN, DANA P on Tue May 26, 2013  3:36 PM ------      Message from: Sharee PimpleMCCHESNEY, MARILYN K      Created: Tue May 26, 2013  9:30 AM      Regarding: Schedule                   ----- Message -----         From: Nada LibmanVance W Brabham, MD         Sent: 05/26/2013   8:55 AM           To: [facility] Charge Pool            05/26/2013:            Surgeon:  Jorge NyBRABHAM IV, V. WELLS      Procedure Performed:       1.  ultrasound-guided access, right femoral artery       2.  abdominal aortogram       3.  bilateral lower extremity runoff       4.  additional order catheterization (left peroneal artery       5.  angioplasty left peroneal artery       6.  followup x2       7.  closure device                         Needs followup in 4-6 weeks with a duplex of her left leg and ABI's ------  05/26/13: spoke with pts daughter, dpm

## 2013-05-26 NOTE — Interval H&P Note (Signed)
History and Physical Interval Note:  05/26/2013 7:34 AM  [name]  has presented today for surgery, with the diagnosis of non healing wound / lower extremity rest pain  The various methods of treatment have been discussed with the patient and family. After consideration of risks, benefits and other options for treatment, the patient has consented to  Procedure(s): LOWER EXTREMITY ANGIOGRAM (N/A) as a surgical intervention .  The patient's history has been reviewed, patient examined, no change in status, stable for surgery.  I have reviewed the patient's chart and labs.  Questions were answered to the patient's satisfaction.     Kalyani Maeda IV, V. WELLS

## 2013-05-30 ENCOUNTER — Inpatient Hospital Stay (HOSPITAL_COMMUNITY): Payer: Medicare Other

## 2013-05-30 ENCOUNTER — Encounter (HOSPITAL_COMMUNITY): Payer: Self-pay | Admitting: Emergency Medicine

## 2013-05-30 ENCOUNTER — Emergency Department (HOSPITAL_COMMUNITY): Payer: Medicare Other

## 2013-05-30 ENCOUNTER — Inpatient Hospital Stay (HOSPITAL_COMMUNITY)
Admission: EM | Admit: 2013-05-30 | Discharge: 2013-06-02 | DRG: 064 | Disposition: A | Discharge status: Skilled Nursing Facility | Payer: Medicare Other | Attending: Internal Medicine | Admitting: Internal Medicine

## 2013-05-30 DIAGNOSIS — R531 Weakness: Secondary | ICD-10-CM | POA: Diagnosis present

## 2013-05-30 DIAGNOSIS — E7800 Pure hypercholesterolemia, unspecified: Secondary | ICD-10-CM

## 2013-05-30 DIAGNOSIS — Z794 Long term (current) use of insulin: Secondary | ICD-10-CM

## 2013-05-30 DIAGNOSIS — E1142 Type 2 diabetes mellitus with diabetic polyneuropathy: Secondary | ICD-10-CM | POA: Diagnosis present

## 2013-05-30 DIAGNOSIS — M6281 Muscle weakness (generalized): Secondary | ICD-10-CM

## 2013-05-30 DIAGNOSIS — Z79899 Other long term (current) drug therapy: Secondary | ICD-10-CM

## 2013-05-30 DIAGNOSIS — I129 Hypertensive chronic kidney disease with stage 1 through stage 4 chronic kidney disease, or unspecified chronic kidney disease: Secondary | ICD-10-CM | POA: Diagnosis present

## 2013-05-30 DIAGNOSIS — R4789 Other speech disturbances: Secondary | ICD-10-CM | POA: Diagnosis present

## 2013-05-30 DIAGNOSIS — E785 Hyperlipidemia, unspecified: Secondary | ICD-10-CM | POA: Diagnosis present

## 2013-05-30 DIAGNOSIS — M109 Gout, unspecified: Secondary | ICD-10-CM | POA: Diagnosis present

## 2013-05-30 DIAGNOSIS — I6350 Cerebral infarction due to unspecified occlusion or stenosis of unspecified cerebral artery: Principal | ICD-10-CM

## 2013-05-30 DIAGNOSIS — N183 Chronic kidney disease, stage 3 unspecified: Secondary | ICD-10-CM | POA: Diagnosis present

## 2013-05-30 DIAGNOSIS — G8190 Hemiplegia, unspecified affecting unspecified side: Secondary | ICD-10-CM | POA: Diagnosis present

## 2013-05-30 DIAGNOSIS — I739 Peripheral vascular disease, unspecified: Secondary | ICD-10-CM | POA: Diagnosis present

## 2013-05-30 DIAGNOSIS — I639 Cerebral infarction, unspecified: Secondary | ICD-10-CM | POA: Diagnosis present

## 2013-05-30 DIAGNOSIS — E1149 Type 2 diabetes mellitus with other diabetic neurological complication: Secondary | ICD-10-CM | POA: Diagnosis present

## 2013-05-30 DIAGNOSIS — G9340 Encephalopathy, unspecified: Secondary | ICD-10-CM | POA: Diagnosis present

## 2013-05-30 DIAGNOSIS — E1140 Type 2 diabetes mellitus with diabetic neuropathy, unspecified: Secondary | ICD-10-CM | POA: Diagnosis present

## 2013-05-30 DIAGNOSIS — N179 Acute kidney failure, unspecified: Secondary | ICD-10-CM | POA: Diagnosis present

## 2013-05-30 DIAGNOSIS — I10 Essential (primary) hypertension: Secondary | ICD-10-CM

## 2013-05-30 DIAGNOSIS — E11649 Type 2 diabetes mellitus with hypoglycemia without coma: Secondary | ICD-10-CM | POA: Diagnosis present

## 2013-05-30 DIAGNOSIS — Z7982 Long term (current) use of aspirin: Secondary | ICD-10-CM

## 2013-05-30 DIAGNOSIS — F0390 Unspecified dementia without behavioral disturbance: Secondary | ICD-10-CM | POA: Diagnosis present

## 2013-05-30 DIAGNOSIS — E119 Type 2 diabetes mellitus without complications: Secondary | ICD-10-CM

## 2013-05-30 DIAGNOSIS — I671 Cerebral aneurysm, nonruptured: Secondary | ICD-10-CM | POA: Diagnosis present

## 2013-05-30 LAB — PROTIME-INR
INR: 0.98 (ref 0.00–1.49)
PROTHROMBIN TIME: 12.8 s (ref 11.6–15.2)

## 2013-05-30 LAB — LIPID PANEL
CHOL/HDL RATIO: 4.3 ratio
Cholesterol: 192 mg/dL (ref 0–200)
HDL: 45 mg/dL (ref >39)
LDL CALC: 117 mg/dL — AB (ref 0–99)
Triglycerides: 149 mg/dL (ref <150)
VLDL: 30 mg/dL (ref 0–40)

## 2013-05-30 LAB — GLUCOSE, CAPILLARY
GLUCOSE-CAPILLARY: 153 mg/dL — AB (ref 70–99)
GLUCOSE-CAPILLARY: 97 mg/dL (ref 70–99)
Glucose-Capillary: 291 mg/dL — ABNORMAL HIGH (ref 70–99)
Glucose-Capillary: 167 mg/dL — ABNORMAL HIGH (ref 70–99)
Glucose-Capillary: 158 mg/dL — ABNORMAL HIGH (ref 70–99)

## 2013-05-30 LAB — I-STAT CHEM 8, ED
BUN: 37 mg/dL — ABNORMAL HIGH (ref 6–23)
Calcium, Ion: 1.24 mmol/L (ref 1.13–1.30)
Chloride: 99 mEq/L (ref 96–112)
Creatinine, Ser: 1.7 mg/dL — ABNORMAL HIGH (ref 0.50–1.10)
Glucose, Bld: 272 mg/dL — ABNORMAL HIGH (ref 70–99)
HEMATOCRIT: 41 % (ref 36.0–46.0)
HEMOGLOBIN: 13.9 g/dL (ref 12.0–15.0)
POTASSIUM: 4.6 meq/L (ref 3.7–5.3)
SODIUM: 139 meq/L (ref 137–147)
TCO2: 31 mmol/L (ref 0–100)

## 2013-05-30 LAB — DIFFERENTIAL
BASOS ABS: 0 10*3/uL (ref 0.0–0.1)
BASOS PCT: 0 % (ref 0–1)
EOS ABS: 0.2 10*3/uL (ref 0.0–0.7)
EOS PCT: 3 % (ref 0–5)
Lymphocytes Relative: 35 % (ref 12–46)
Lymphs Abs: 2.8 10*3/uL (ref 0.7–4.0)
MONOS PCT: 6 % (ref 3–12)
Monocytes Absolute: 0.4 10*3/uL (ref 0.1–1.0)
Neutro Abs: 4.4 10*3/uL (ref 1.7–7.7)
Neutrophils Relative %: 56 % (ref 43–77)

## 2013-05-30 LAB — I-STAT TROPONIN, ED: Troponin i, poc: 0 ng/mL (ref 0.00–0.08)

## 2013-05-30 LAB — COMPREHENSIVE METABOLIC PANEL
ALK PHOS: 102 U/L (ref 39–117)
ALT: 12 U/L (ref 0–35)
AST: 15 U/L (ref 0–37)
Albumin: 3.1 g/dL — ABNORMAL LOW (ref 3.5–5.2)
BUN: 37 mg/dL — ABNORMAL HIGH (ref 6–23)
CHLORIDE: 95 meq/L — AB (ref 96–112)
CO2: 29 meq/L (ref 19–32)
Calcium: 9.9 mg/dL (ref 8.4–10.5)
Creatinine, Ser: 1.59 mg/dL — ABNORMAL HIGH (ref 0.50–1.10)
GFR, EST AFRICAN AMERICAN: 31 mL/min — AB (ref >90)
GFR, EST NON AFRICAN AMERICAN: 27 mL/min — AB (ref >90)
Glucose, Bld: 276 mg/dL — ABNORMAL HIGH (ref 70–99)
Potassium: 4.7 mEq/L (ref 3.7–5.3)
Sodium: 138 mEq/L (ref 137–147)
TOTAL PROTEIN: 7.7 g/dL (ref 6.0–8.3)
Total Bilirubin: <0.2 mg/dL — ABNORMAL LOW (ref 0.3–1.2)

## 2013-05-30 LAB — CBC
HEMATOCRIT: 38.7 % (ref 36.0–46.0)
HEMOGLOBIN: 12.9 g/dL (ref 12.0–15.0)
MCH: 29.4 pg (ref 26.0–34.0)
MCHC: 33.3 g/dL (ref 30.0–36.0)
MCV: 88.2 fL (ref 78.0–100.0)
Platelets: 226 10*3/uL (ref 150–400)
RBC: 4.39 MIL/uL (ref 3.87–5.11)
RDW: 14.9 % (ref 11.5–15.5)
WBC: 7.9 10*3/uL (ref 4.0–10.5)

## 2013-05-30 LAB — HEMOGLOBIN A1C
Hgb A1c MFr Bld: 10 % — ABNORMAL HIGH (ref <5.7)
Mean Plasma Glucose: 240 mg/dL — ABNORMAL HIGH (ref <117)

## 2013-05-30 LAB — APTT: APTT: 35 s (ref 24–37)

## 2013-05-30 MED ORDER — AMLODIPINE BESYLATE 10 MG PO TABS
10.0000 mg | ORAL_TABLET | Freq: Every day | ORAL | Status: DC
  Administered 2013-05-30 – 2013-06-02 (×4): 10 mg via ORAL
  Filled 2013-05-30 (×4): qty 1

## 2013-05-30 MED ORDER — ALLOPURINOL 100 MG PO TABS
100.0000 mg | ORAL_TABLET | Freq: Two times a day (BID) | ORAL | Status: DC
  Administered 2013-05-30 – 2013-06-02 (×7): 100 mg via ORAL
  Filled 2013-05-30 (×8): qty 1

## 2013-05-30 MED ORDER — INSULIN ASPART PROT & ASPART (70-30 MIX) 100 UNIT/ML ~~LOC~~ SUSP
20.0000 [IU] | Freq: Every day | SUBCUTANEOUS | Status: DC
  Administered 2013-05-30: 20 [IU] via SUBCUTANEOUS

## 2013-05-30 MED ORDER — HYDRALAZINE HCL 20 MG/ML IJ SOLN: 2.0000 mg | Freq: Four times a day (QID) | INTRAMUSCULAR | Status: DC | PRN

## 2013-05-30 MED ORDER — GABAPENTIN 300 MG PO CAPS
300.0000 mg | ORAL_CAPSULE | Freq: Three times a day (TID) | ORAL | Status: DC
  Administered 2013-05-30 – 2013-06-02 (×10): 300 mg via ORAL
  Filled 2013-05-30 (×12): qty 1

## 2013-05-30 MED ORDER — ATORVASTATIN CALCIUM 10 MG PO TABS
10.0000 mg | ORAL_TABLET | Freq: Every day | ORAL | Status: DC
  Administered 2013-05-30 – 2013-06-01 (×3): 10 mg via ORAL
  Filled 2013-05-30 (×4): qty 1

## 2013-05-30 MED ORDER — STROKE: EARLY STAGES OF RECOVERY BOOK
Freq: Once | Status: AC
  Administered 2013-05-31: 13:00:00
  Filled 2013-05-30: qty 1

## 2013-05-30 MED ORDER — SENNOSIDES-DOCUSATE SODIUM 8.6-50 MG PO TABS
1.0000 | ORAL_TABLET | Freq: Every evening | ORAL | Status: DC | PRN
  Filled 2013-05-30: qty 1

## 2013-05-30 MED ORDER — HYDROMORPHONE HCL PF 1 MG/ML IJ SOLN: 0.5000 mg | INTRAMUSCULAR | Status: DC | PRN

## 2013-05-30 MED ORDER — CADEXOMER IODINE 0.9 % EX GEL
1.0000 "application " | CUTANEOUS | Status: DC
  Administered 2013-06-01: 1 via TOPICAL
  Filled 2013-05-30: qty 40

## 2013-05-30 MED ORDER — SODIUM CHLORIDE 0.9 % IV BOLUS (SEPSIS)
500.0000 mL | Freq: Once | INTRAVENOUS | Status: AC
  Administered 2013-05-30: 500 mL via INTRAVENOUS

## 2013-05-30 MED ORDER — ENOXAPARIN SODIUM 30 MG/0.3ML ~~LOC~~ SOLN
30.0000 mg | SUBCUTANEOUS | Status: DC
  Administered 2013-05-30 – 2013-06-02 (×4): 30 mg via SUBCUTANEOUS
  Filled 2013-05-30 (×4): qty 0.3

## 2013-05-30 MED ORDER — INSULIN ASPART PROT & ASPART (70-30 MIX) 100 UNIT/ML ~~LOC~~ SUSP
30.0000 [IU] | Freq: Every day | SUBCUTANEOUS | Status: DC
  Administered 2013-05-30 – 2013-05-31 (×2): 30 [IU] via SUBCUTANEOUS
  Filled 2013-05-30: qty 10

## 2013-05-30 MED ORDER — INSULIN ASPART 100 UNIT/ML ~~LOC~~ SOLN
0.0000 [IU] | SUBCUTANEOUS | Status: DC
  Administered 2013-05-30: 2 [IU] via SUBCUTANEOUS
  Administered 2013-05-30: 5 [IU] via SUBCUTANEOUS
  Administered 2013-05-30 (×2): 2 [IU] via SUBCUTANEOUS
  Administered 2013-05-31: 3 [IU] via SUBCUTANEOUS
  Administered 2013-05-31 – 2013-06-01 (×4): 2 [IU] via SUBCUTANEOUS
  Administered 2013-06-01: 5 [IU] via SUBCUTANEOUS
  Administered 2013-06-02: 2 [IU] via SUBCUTANEOUS
  Administered 2013-06-02: 3 [IU] via SUBCUTANEOUS

## 2013-05-30 MED ORDER — SODIUM CHLORIDE 0.9 % IV SOLN
INTRAVENOUS | Status: AC
  Administered 2013-05-30: 05:00:00 via INTRAVENOUS

## 2013-05-30 MED ORDER — GLIPIZIDE 5 MG PO TABS
5.0000 mg | ORAL_TABLET | Freq: Two times a day (BID) | ORAL | Status: DC
  Administered 2013-05-30 – 2013-06-02 (×7): 5 mg via ORAL
  Filled 2013-05-30 (×10): qty 1

## 2013-05-30 MED ORDER — LORATADINE 10 MG PO TABS
10.0000 mg | ORAL_TABLET | Freq: Every day | ORAL | Status: DC
  Administered 2013-05-30 – 2013-06-02 (×4): 10 mg via ORAL
  Filled 2013-05-30 (×4): qty 1

## 2013-05-30 MED ORDER — DONEPEZIL HCL 10 MG PO TABS
10.0000 mg | ORAL_TABLET | Freq: Every day | ORAL | Status: DC
  Administered 2013-05-30 – 2013-06-01 (×3): 10 mg via ORAL
  Filled 2013-05-30 (×4): qty 1

## 2013-05-30 NOTE — Evaluation (Signed)
Clinical/Bedside Swallow Evaluation Patient Details  Name: [name] MRN: [id number] Date of Birth: July 03, 1917  Today's Date: 05/30/2013 Time: 1200-1230 SLP Time Calculation (min): 30 min  Past Medical History:  Past Medical History  Diagnosis Date  . UTI (urinary tract infection)   . Hypertension   . Diabetes mellitus   . Gout   . High cholesterol   . Complication of anesthesia   . Diabetic neuropathy   . PVD (peripheral vascular disease)   . Carotid artery occlusion   . Arthritis   . Chronic kidney disease   . GERD (gastroesophageal reflux disease)   . Vertigo   . Venous insufficiency   . Mood disorder   . Dysphagia    Past Surgical History:  Past Surgical History  Procedure Laterality Date  . Hemorrhoid banding    . Abdominal hysterectomy    . Cardiac catheterization     HPI:  80 year old female admitted 05/30/13 due to increased falls at home, slurred speech and left sided weakness.  PMH significant for GERD.     Assessment / Plan / Recommendation Clinical Impression  Pt did not wish to place dentures prior to lunch.  Left side weakness noted, however, pt speech was remarkably intelligible.  No overt s/s aspiration observed with any consistency tested, however, pt tended to pocket solids in the left cheek, and required verbal and tactile cues to clear.  No oral leakage noted with thin liquid via straw.  Will recommend changing diet to dys 2 for energy conservation, and to minimize left pocketing.  SLP to monitor diet tolerance and readiness to advance diet. Precautions posted at [facility]    Aspiration Risk  Mild    Diet Recommendation Dysphagia 2 (Fine chop);Thin liquid   Liquid Administration via: Straw Medication Administration: Whole meds with liquid Supervision: Patient able to self feed;Intermittent supervision to cue for compensatory strategies Compensations: Slow rate;Small sips/bites;Check for pocketing Postural Changes and/or Swallow Maneuvers: Seated  upright 90 degrees    Other  Recommendations Oral Care Recommendations: Oral care BID   Follow Up Recommendations       Frequency and Duration min 1 x/week  1 week   Pertinent Vitals/Pain VSS, no pain reported    SLP Swallow Goals  tolerate least restrictive diet, adhere to posted precautions   Swallow Study Prior Functional Status   No history of dysphagia reported. Regular diet/thin prior to admit.    General Date of Onset: 05/30/13 HPI: 80 year old female admitted 05/30/13 due to increased falls at home, slurred speech and left sided weakness.  PMH significant for GERD.   Type of Study: Bedside swallow evaluation Previous Swallow Assessment: n/a Diet Prior to this Study: Regular;Thin liquids Temperature Spikes Noted: No Respiratory Status: Room air History of Recent Intubation: No Behavior/Cognition: Alert;Cooperative;Pleasant mood Oral Cavity - Dentition: Edentulous (dentures in room, but pt did not want to place them to eat) Self-Feeding Abilities: Able to feed self Patient Positioning: Upright in bed Baseline Vocal Quality: Clear Volitional Cough: Strong Volitional Swallow: Able to elicit    Oral/Motor/Sensory Function Overall Oral Motor/Sensory Function: Impaired Labial ROM: Reduced left Labial Symmetry: Abnormal symmetry left Labial Strength: Reduced Labial Sensation: Within Functional Limits Lingual ROM: Within Functional Limits Lingual Symmetry: Within Functional Limits Lingual Strength: Within Functional Limits Lingual Sensation: Within Functional Limits Facial ROM: Reduced left Facial Symmetry: Left droop Facial Strength: Reduced Facial Sensation: Within Functional Limits Velum: Within Functional Limits Mandible: Within Functional Limits   Ice Chips Ice chips: Not tested  Thin Liquid Thin Liquid: Within functional limits Presentation: Straw;Self Fed Other Comments: RN reported anterior leakage when drinking from cup    Nectar Thick Nectar Thick  Liquid: Not tested   Honey Thick Honey Thick Liquid: Not tested   Puree Puree: Within functional limits Presentation: Self Fed;Spoon   Solid   GO   [name], [facility] Center LLCMSP, CCC-SLP [phone] Solid: Impaired Presentation: Self Fed Oral Phase Impairments: Reduced labial seal;Impaired anterior to posterior transit;Poor awareness of bolus Oral Phase Functional Implications: Left lateral sulci pocketing;Oral residue       [name], [name] 05/30/2013,1:01 PM

## 2013-05-30 NOTE — H&P (Signed)
[facility] History and Physical  Aminta Goates ZOX:[id number] DOB: 02-23-1918 DOA: 05/30/2013  Referring physician:  EDP PCP: [name], MD  Specialists:   Chief Complaint: Slurred Speech and left Arm And Leg Weakness  HPI: [name] is a 80 y.o. female with a history of DM2, HTN, and PVD who was brought to the ED after her daughter noticed that she was having slurring of her speech and worsening left sided weakness.   Patient had been observed to have had 2 falls during the day the first fall occurred around 8 am, and the patient reports that this was when her symptoms of weakness and heaviness in her left side began.   She reports not being able to control her left arm, and her left leg was heavy and numb.   She denies having any headaches or chest pain or lightheadedness, or syncope associated with her symptoms.  At 9 pm her daughter had noticed her speech was mildly slurred.   She was brought to the ED for evaluation.   In the ED,  she had a CT scan performed which ws negative for Acute Findings.     Of Note:  the patient underwent an Angioplasty of the Left Peroneal Artery due to PVD ( *0% Stenosis) by Dr. Myra Gianotti on 05/27/2013.      Review of Systems:  Constitutional: No Weight Loss, No Weight Gain, Night Sweats, Fevers, Chills, Fatigue, or Generalized Weakness HEENT: No Headaches, Tooth/Dental Problems,Sore Throat,  No Sneezing, Rhinitis, Ear Ache, Nasal Congestion, or Post Nasal Drip,  Cardio-vascular:  No Chest pain, Orthopnea, PND, +Edema in lower extremities, Anasarca, Dizziness, Palpitations  Resp: No Dyspnea, No DOE, No Productive Cough, No Non-Productive Cough, No Hemoptysis, No Change in Color of Mucus,  No Wheezing.    GI: No Heartburn, Indigestion, Abdominal Pain, Nausea, Vomiting, Diarrhea, Change in Bowel Habits,  Loss of Appetite  GU: No Dysuria, Change in Color of Urine, No Urgency or Frequency.  No Flank pain.  Musculoskeletal: No Joint Pain or  Swelling.  No Decreased Range of Motion. No Back Pain.  Neurologic: No Syncope, No Seizures, +Weakness Left Side, Paresthesia, Vision Disturbance or Loss, No Diplopia, No Vertigo,+Difficulty Walking,  Skin: No Rash or Lesions. Psych: No Change in Mood or Affect. No Depression or Anxiety. No Memory loss. No Confusion or Hallucinations   Past Medical History  Diagnosis Date  . UTI (urinary tract infection)   . Hypertension   . Diabetes mellitus   . Gout   . High cholesterol   . Complication of anesthesia   . Diabetic neuropathy   . PVD (peripheral vascular disease)   . Carotid artery occlusion   . Arthritis   . Chronic kidney disease   . GERD (gastroesophageal reflux disease)   . Vertigo   . Venous insufficiency   . Mood disorder   . Dysphagia       Past Surgical History  Procedure Laterality Date  . Hemorrhoid banding    . Abdominal hysterectomy    . Cardiac catheterization         Prior to Admission medications   Medication Sig Start Date End Date Taking? Authorizing Provider  allopurinol (ZYLOPRIM) 100 MG tablet Take 100 mg by mouth 2 (two) times daily.   Yes Historical Provider, MD  amLODipine (NORVASC) 10 MG tablet Take 1 tablet (10 mg total) by mouth daily. 11/26/11  Yes Catarina Hartshorn, MD  aspirin EC 81 MG EC tablet Take 1 tablet (81 mg total) by mouth  daily. 11/26/11  Yes Catarina Hartshornavid Tat, MD  cadexomer iodine (IODOSORB) 0.9 % gel Apply 1 application topically daily as needed for wound care (to big toe).   Yes Historical Provider, MD  cetirizine (ZYRTEC) 10 MG tablet Take 10 mg by mouth daily.   Yes Historical Provider, MD  donepezil (ARICEPT) 10 MG tablet Take 10 mg by mouth at bedtime.    Yes Historical Provider, MD  gabapentin (NEURONTIN) 300 MG capsule Take 300 mg by mouth 3 (three) times daily.    Yes Historical Provider, MD  glipiZIDE (GLUCOTROL) 5 MG tablet Take 5 mg by mouth 2 (two) times daily before a meal.   Yes Historical Provider, MD  HYDROcodone-acetaminophen  (NORCO/VICODIN) 5-325 MG per tablet Take 1 tablet by mouth every 6 (six) hours as needed for moderate pain.   Yes Historical Provider, MD  Insulin Lispro Prot & Lispro (HUMALOG MIX 75/25 KWIKPEN) (75-25) 100 UNIT/ML SUPN Inject 20-30 Units into the skin 2 (two) times daily. Use 30 units every morning and 20 units every evening   Yes Historical Provider, MD  phenylephrine-shark liver oil-mineral oil-petrolatum (PREPARATION H) 0.25-3-14-71.9 % rectal ointment Place 1 application rectally 2 (two) times daily as needed for hemorrhoids.   Yes Historical Provider, MD  rosuvastatin (CRESTOR) 5 MG tablet Take 5 mg by mouth daily.   Yes Historical Provider, MD  Vortioxetine HBr (BRINTELLIX) 10 MG TABS Take 10 mg by mouth daily.    Yes Historical Provider, MD  Wheat Dextrin (BENEFIBER DRINK MIX) PACK Take 1 packet by mouth daily as needed (stool).    Yes Historical Provider, MD      No Known Allergies   Social History:  reports that she has never smoked. She has never used smokeless tobacco. She reports that she does not drink alcohol or use illicit drugs.     Family History  Problem Relation Age of Onset  . Heart disease Daughter   . Hypertension Daughter   . Diabetes Son   . Heart disease Son        Physical Exam:  GEN:  Pleasant Thin Elderly 80 y.o. Trin[place]an American female  examined  and in no acute distress; cooperative with exam Filed Vitals:   05/30/13 0015 05/30/13 0115 05/30/13 0140 05/30/13 0310  BP: 131/82 129/88  133/86  Pulse: 60 64  80  Temp:   97.5 F (36.4 C) 97.9 F (36.6 C)  TempSrc:    Oral  Resp: 12 19  16   Height:    5\' 4"  (1.626 m)  Weight:    58.423 kg (128 lb 12.8 oz)  SpO2: 97% 97%  95%   Blood pressure 133/86, pulse 80, temperature 97.9 F (36.6 C), temperature source Oral, resp. rate 16, height 5\' 4"  (1.626 m), weight 58.423 kg (128 lb 12.8 oz), SpO2 95.00%. PSYCH: She is alert and oriented x4; does not appear anxious does not appear depressed; affect is  normal HEENT: Normocephalic and Atraumatic, Mucous membranes pink; PERRLA; EOM intact; Fundi:  Benign;  No scleral icterus, Nares: Patent, Oropharynx: Clear, Edentulous:  Dentures Present, Neck:  FROM, no cervical lymphadenopathy nor thyromegaly or carotid bruit; no JVD; Breasts:: Not examined CHEST WALL: No tenderness CHEST: Normal respiration, clear to auscultation bilaterally HEART: Regular rate and rhythm; no murmurs rubs or gallops BACK: No kyphosis or scoliosis; no CVA tenderness ABDOMEN: Positive Bowel Sounds, Scaphoid, Obese, soft non-tender; no masses, no organomegaly, no pannus; no intertriginous candida. Rectal Exam: Not done EXTREMITIES: Chronic Venous Stasis Changes of BLEs,  No cyanosis, clubbing,  +Trace EDEMA:  Right > Left LLE   +Ulcerations of Left  Great Toe. Genitalia: not examined SKIN: Normal hydration no rash  CNS:   Alert and Oriented x 4,  No Facial Droop, Speech Clear,  +Left sided hemiparesis,      Vascular:   Decreased Dorsal Pedal Pulses:  Right > Left.   Labs on Admission:  Basic Metabolic Panel:  Recent Labs Lab 05/26/13 0611 05/30/13 0014 05/30/13 0026  NA 140 138 139  K 4.0 4.7 4.6  CL 100 95* 99  CO2  --  29  --   GLUCOSE 304* 276* 272*  BUN 29* 37* 37*  CREATININE 1.30* 1.59* 1.70*  CALCIUM  --  9.9  --    Liver Function Tests:  Recent Labs Lab 05/30/13 0014  AST 15  ALT 12  ALKPHOS 102  BILITOT <0.2*  PROT 7.7  ALBUMIN 3.1*   No results found for this basename: LIPASE, AMYLASE,  in the last 168 hours No results found for this basename: AMMONIA,  in the last 168 hours CBC:  Recent Labs Lab 05/26/13 0611 05/30/13 0014 05/30/13 0026  WBC  --  7.9  --   NEUTROABS  --  4.4  --   HGB 14.6 12.9 13.9  HCT 43.0 38.7 41.0  MCV  --  88.2  --   PLT  --  226  --    Cardiac Enzymes: No results found for this basename: CKTOTAL, CKMB, CKMBINDEX, TROPONINI,  in the last 168 hours  BNP (last 3 results) No results found for this  basename: PROBNP,  in the last 8760 hours CBG:  Recent Labs Lab 05/26/13 0928 05/30/13 0308  GLUCAP 226* 291*    Radiological Exams on Admission: Ct Head (brain) Wo Contrast  05/30/2013   CLINICAL DATA:  Slurred speech, left arm/leg weakness  EXAM: CT HEAD WITHOUT CONTRAST  TECHNIQUE: Contiguous axial images were obtained from the base of the skull through the vertex without intravenous contrast.  COMPARISON:  05/20/2012  FINDINGS: No evidence of parenchymal hemorrhage or extra-axial fluid collection. No mass lesion, mass effect, or midline shift.  No CT evidence of acute infarction.  Subcortical white matter and periventricular small vessel ischemic changes. Intracranial atherosclerosis.  Global cortical atrophy, likely age appropriate. No ventriculomegaly.  Partial opacification/mucosal thickening in the bilateral maxillary sinuses. Mastoid air cells are clear.  No evidence of calvarial fracture.  IMPRESSION: No evidence of acute intracranial abnormality.  Age related atrophy, small-vessel ischemic changes, and intracranial atherosclerosis.   Electronically Signed   By: Charline Bills M.D.   On: 05/30/2013 00:50      EKG: Independently reviewed. Normal sinus Rhythm at 70  No acute S-T changes.       Assessment/Plan:   80 y.o. female with  Principal Problem:   Left-sided weakness Active Problems:   CVA (cerebral infarction)   Hypertension   High cholesterol   DM2 (diabetes mellitus, type 2)   Peripheral vascular disease, unspecified   ARF (acute renal failure)     1.   Left Sided Weakness-  CVA workup Initiated,Neuro consulted by the EDP.   Neuro checks, and ASA Rx.  MRI/MRA in AM along with 2D ECHO and Carotid US.  2.   HTN- continue  Amlodipine,    IV Hydralazine PRN.    3.   Hyperlipidemia-  Continue Statin Rx (Crestor at Home).     4.  DM2-  continue Insulin 75/25  BID, and Glipizide,  SSI coverage added PRN, check HbA1c level.    5.   PVD-  Recent intervention,  continue Statin Rx and  ASA Rx, and Diabetic control.    6.   ARF- Gentle hydration.    7.   DVT prophylaxis with Lovenox.      Code Status:   FULL CODE    Family Communication:    Daughter at bedside. Disposition Plan:       Telemetry bed  Time spent: 60 Minutes  Ron Parker [facility] Pager [phone]  If 7PM-7AM, please contact night-coverage [email or web address] Password [email or web address] 05/30/2013, 6:01 AM

## 2013-05-30 NOTE — ED Notes (Signed)
Patient transported to CT 

## 2013-05-30 NOTE — Progress Notes (Signed)
Pt daughter called in requesting information. Pt acknowledged it is ok to release info to her. CODE set up: DARD

## 2013-05-30 NOTE — ED Notes (Signed)
According to EMS, daughter called she said she went to work and last saw her mother at 1430hrs.  So when the daughter got home at 2325hrs, she said her mother was left arm weakness, left leg weakness and slurred speech.  The daughter said when she left the mother this afternoon at 1430 she was normal.  EMS advised she is a glascow of 15 when they arrived and they had carry her to put her on the stretcher.  According to the patient she fell twice today but was not injured.  The patient was brought to the ED to be evaluated.

## 2013-05-30 NOTE — Evaluation (Signed)
Speech Language Pathology Evaluation Patient Details Name: [name] MRN: [id number] DOB: 1918-02-25 Today's Date: 05/30/2013 Time: [id number] SLP Time Calculation (min): 15 min  Problem List:  Patient Active Problem List   Diagnosis Date Noted  . CVA (cerebral infarction) 05/30/2013  . Left-sided weakness 05/30/2013  . CKD (chronic kidney disease), stage III 05/30/2013  . Peripheral vascular disease, unspecified 12/22/2012  . Diabetic neuropathy   . DM2 (diabetes mellitus, type 2) 11/22/2011  . Hypertension   . High cholesterol    Past Medical History:  Past Medical History  Diagnosis Date  . UTI (urinary tract infection)   . Hypertension   . Diabetes mellitus   . Gout   . High cholesterol   . Complication of anesthesia   . Diabetic neuropathy   . PVD (peripheral vascular disease)   . Carotid artery occlusion   . Arthritis   . Chronic kidney disease   . GERD (gastroesophageal reflux disease)   . Vertigo   . Venous insufficiency   . Mood disorder   . Dysphagia    Past Surgical History:  Past Surgical History  Procedure Laterality Date  . Hemorrhoid banding    . Abdominal hysterectomy    . Cardiac catheterization     HPI:  80 year old female admitted 05/30/13 due to increased falls at home, slurred speech and left sided weakness.  PMH significant for GERD.     Assessment / Plan / Recommendation Clinical Impression  Pt speech is minimally dysarthric, however, this may be largely related to not having her dentures in place.  Comm/Cog appear to be at baseline.      SLP Assessment  Patient does not need any further Speech Lanaguage Pathology Services    Follow Up Recommendations       Frequency and Duration min 1 x/week      Pertinent Vitals/Pain VSS, no pain reported   SLP Goals   n/a  SLP Evaluation Prior Functioning  Cognitive/Linguistic Baseline: Information not available Type of Home: Apartment  Lives With: Alone Vocation: Retired    IT consultantCognition  Overall Cognitive Status: Within Functional Limits for tasks assessed    Comprehension  Auditory Comprehension Overall Auditory Comprehension: Appears within functional limits for tasks assessed    Expression Expression Primary Mode of Expression: Verbal Verbal Expression Overall Verbal Expression: Appears within functional limits for tasks assessed   Oral / Motor Oral Motor/Sensory Function Overall Oral Motor/Sensory Function:  (see BSE) Labial ROM: Reduced left Labial Symmetry: Abnormal symmetry left Labial Strength: Reduced Labial Sensation: Within Functional Limits Lingual ROM: Within Functional Limits Lingual Symmetry: Within Functional Limits Lingual Strength: Within Functional Limits Lingual Sensation: Within Functional Limits Facial ROM: Reduced left Facial Symmetry: Left droop Facial Strength: Reduced Facial Sensation: Within Functional Limits Velum: Within Functional Limits Mandible: Within Functional Limits Motor Speech Overall Motor Speech: Impaired Phonation: Normal Resonance: Within functional limits Articulation: Impaired Intelligibility: Intelligibility reduced Word: 75-100% accurate Phrase: 75-100% accurate Sentence: 75-100% accurate Conversation: 75-100% accurate Motor Planning: Witnin functional limits Motor Speech Errors: Not applicable Interfering Components: Inadequate dentition   GO    Celia B. Murvin NatalBueche, [facility] HospitalMSP, CCC-SLP [phone]  Leigh AuroraBueche, Celia Brown 05/30/2013, 1:40 PM

## 2013-05-30 NOTE — ED Provider Notes (Signed)
CSN: [id number]     Arrival date & time 05/30/13  0000 History   First MD Initiated Contact with Patient 05/30/13 0009     Chief Complaint  Patient presents with  . Stroke Symptoms     (Consider location/radiation/quality/duration/timing/severity/associated sxs/prior Treatment) HPI Comments: 80 year old female with high blood pressure, high cholesterol, renal failure, diabetes, diabetic neuropathy and vascular disease presents with strokelike symptoms. Patient was last seen at her baseline at approximately 2:30 PM today. Sometime between 2:30 and 8:00 PM tonight patient developed left-sided weakness. No history of known stroke or heart issues. Patient is on baby aspirin daily, she has had it today. Symptoms have been constant since her daughter evaluated her this evening. She had 2 falls today where she felt her left leg gave way.  The history is provided by the patient and a relative.    Past Medical History  Diagnosis Date  . UTI (urinary tract infection)   . Hypertension   . Diabetes mellitus   . Gout   . High cholesterol   . Complication of anesthesia   . Diabetic neuropathy   . PVD (peripheral vascular disease)   . Carotid artery occlusion   . Arthritis   . Chronic kidney disease   . GERD (gastroesophageal reflux disease)   . Vertigo   . Venous insufficiency   . Mood disorder   . Dysphagia    Past Surgical History  Procedure Laterality Date  . Hemorrhoid banding    . Abdominal hysterectomy     Family History  Problem Relation Age of Onset  . Heart disease Daughter   . Hypertension Daughter   . Diabetes Son   . Heart disease Son    History  Substance Use Topics  . Smoking status: Never Smoker   . Smokeless tobacco: Never Used  . Alcohol Use: No   OB History   Grav Para Term Preterm Abortions TAB SAB Ect Mult Living                 Review of Systems  Constitutional: Negative for fever and chills.  HENT: Negative for congestion.   Eyes: Negative for  visual disturbance.  Respiratory: Negative for shortness of breath.   Cardiovascular: Negative for chest pain.  Gastrointestinal: Negative for vomiting and abdominal pain.  Genitourinary: Negative for dysuria and flank pain.  Musculoskeletal: Negative for back pain, neck pain and neck stiffness.  Skin: Negative for rash.  Neurological: Positive for speech difficulty and weakness. Negative for light-headedness and headaches.      Allergies  Review of patient's allergies indicates no known allergies.  Home Medications   Current Outpatient Rx  Name  Route  Sig  Dispense  Refill  . allopurinol (ZYLOPRIM) 100 MG tablet   Oral   Take 100 mg by mouth 2 (two) times daily.         Marland Kitchen. amLODipine (NORVASC) 10 MG tablet   Oral   Take 1 tablet (10 mg total) by mouth daily.   30 tablet   3   . aspirin EC 81 MG EC tablet   Oral   Take 1 tablet (81 mg total) by mouth daily.         . cadexomer iodine (IODOSORB) 0.9 % gel   Topical   Apply 1 application topically daily as needed for wound care (to big toe).         . cetirizine (ZYRTEC) 10 MG tablet   Oral   Take 10 mg by mouth daily.         .Marland Kitchen  donepezil (ARICEPT) 10 MG tablet   Oral   Take 10 mg by mouth at bedtime.          . gabapentin (NEURONTIN) 300 MG capsule   Oral   Take 300 mg by mouth 3 (three) times daily.          Marland Kitchen glipiZIDE (GLUCOTROL) 5 MG tablet   Oral   Take 5 mg by mouth 2 (two) times daily before a meal.         . HYDROcodone-acetaminophen (NORCO/VICODIN) 5-325 MG per tablet   Oral   Take 1 tablet by mouth every 6 (six) hours as needed for moderate pain.         . Insulin Lispro Prot & Lispro (HUMALOG MIX 75/25 KWIKPEN) (75-25) 100 UNIT/ML SUPN   Subcutaneous   Inject 20-30 Units into the skin 2 (two) times daily. Use 30 units every morning and 20 units every evening         . phenylephrine-shark liver oil-mineral oil-petrolatum (PREPARATION H) 0.25-3-14-71.9 % rectal ointment    Rectal   Place 1 application rectally 2 (two) times daily as needed for hemorrhoids.         . rosuvastatin (CRESTOR) 5 MG tablet   Oral   Take 5 mg by mouth daily.         . Vortioxetine HBr (BRINTELLIX) 10 MG TABS   Oral   Take 10 mg by mouth daily.          . Wheat Dextrin (BENEFIBER DRINK MIX) PACK   Oral   Take 1 packet by mouth daily as needed (stool).           BP 150/81  Pulse 87  Temp(Src) 97.9 F (36.6 C) (Oral)  Resp 19  Wt 113 lb (51.256 kg)  SpO2 97% Physical Exam  Nursing note and vitals reviewed. Constitutional: She is oriented to person, place, and time. She appears well-developed and well-nourished.  HENT:  Head: Normocephalic and atraumatic.  Eyes: Conjunctivae are normal. Right eye exhibits no discharge. Left eye exhibits no discharge.  Neck: Normal range of motion. Neck supple. No tracheal deviation present.  Cardiovascular: Normal rate and regular rhythm.   Pulmonary/Chest: Effort normal and breath sounds normal.  Abdominal: Soft. She exhibits no distension. There is no tenderness. There is no guarding.  Musculoskeletal: She exhibits no edema.  Neurological: She is alert and oriented to person, place, and time. GCS eye subscore is 4. GCS verbal subscore is 5. GCS motor subscore is 6.  5+ strength in right UE and LE with f/e at major joints, 4+ strength on left lower and 3+ left upper side Sensation to palpation intact in UE and LE, however less sensation on left side CNs 2-12 grossly intact, mild dysarthria.  EOMFI.  PERRL.   Finger nose and coordination intact right side, difficult on left due to weakness..   Visual fields intact to finger testing.   Skin: Skin is warm. No rash noted.  Psychiatric: She has a normal mood and affect.    ED Course  Procedures (including critical care time) Labs Review Labs Reviewed  COMPREHENSIVE METABOLIC PANEL - Abnormal; Notable for the following:    Chloride 95 (*)    Glucose, Bld 276 (*)    BUN 37  (*)    Creatinine, Ser 1.59 (*)    Albumin 3.1 (*)    Total Bilirubin <0.2 (*)    GFR calc non Af Amer 27 (*)    GFR calc Af  Amer 31 (*)    All other components within normal limits  GLUCOSE, CAPILLARY - Abnormal; Notable for the following:    Glucose-Capillary 291 (*)    All other components within normal limits  LIPID PANEL - Abnormal; Notable for the following:    LDL Cholesterol 117 (*)    All other components within normal limits  I-STAT CHEM 8, ED - Abnormal; Notable for the following:    BUN 37 (*)    Creatinine, Ser 1.70 (*)    Glucose, Bld 272 (*)    All other components within normal limits  PROTIME-INR  APTT  CBC  DIFFERENTIAL  HEMOGLOBIN A1C  I-STAT TROPOININ, ED   Imaging Review Ct Head (brain) Wo Contrast  05/30/2013   CLINICAL DATA:  Slurred speech, left arm/leg weakness  EXAM: CT HEAD WITHOUT CONTRAST  TECHNIQUE: Contiguous axial images were obtained from the base of the skull through the vertex without intravenous contrast.  COMPARISON:  05/20/2012  FINDINGS: No evidence of parenchymal hemorrhage or extra-axial fluid collection. No mass lesion, mass effect, or midline shift.  No CT evidence of acute infarction.  Subcortical white matter and periventricular small vessel ischemic changes. Intracranial atherosclerosis.  Global cortical atrophy, likely age appropriate. No ventriculomegaly.  Partial opacification/mucosal thickening in the bilateral maxillary sinuses. Mastoid air cells are clear.  No evidence of calvarial fracture.  IMPRESSION: No evidence of acute intracranial abnormality.  Age related atrophy, small-vessel ischemic changes, and intracranial atherosclerosis.   Electronically Signed   By: Charline Bills M.D.   On: 05/30/2013 00:50     EKG Interpretation None      Date: 05/30/2013  Rate: 70  Rhythm: normal sinus rhythm  QRS Axis: left  Intervals: normal  ST/T Wave abnormalities: nonspecific ST changes  Conduction Disutrbances:none  Narrative  Interpretation: no acute stemi   MDM   Final diagnoses:  Left-sided weakness  Stroke  ARF (acute renal failure)   Patient clinically developed a stroke sometime between 2 and 8 PM today. Left-sided weakness persists on my evaluation. Patient is out of the window for TPA at this time. Plan for stroke evaluation, neuro consult and telemetry admission to the hospitalist. CT scan results reviewed and no acute bleeding. Hospitalist page. Recheck patient in no change in neuro exam. EKG reviewed.   Spoke with hospitalist, I did not receive a call back from neurology.   The patients results and plan were reviewed and discussed.   Any x-rays performed were personally reviewed by myself.   Differential diagnosis were considered with the presenting HPI.    EKG: reviewed  Filed Vitals:   05/30/13 0015 05/30/13 0115 05/30/13 0140 05/30/13 0310  BP: 131/82 129/88  133/86  Pulse: 60 64  80  Temp:   97.5 F (36.4 C) 97.9 F (36.6 C)  TempSrc:    Oral  Resp: 12 19  16   Height:    5\' 4"  (1.626 m)  Weight:    128 lb 12.8 oz (58.423 kg)  SpO2: 97% 97%  95%    Admission/ observation were discussed with the admitting physician, patient and/or family and they are comfortable with the plan.          Enid Skeens, MD 05/30/13 [phone]

## 2013-05-30 NOTE — ED Notes (Signed)
MD at bedside. 

## 2013-05-30 NOTE — Progress Notes (Signed)
[facility] PROGRESS NOTE  [name] WUJ:[id number] DOB: 1917/04/12 DOA: 05/30/2013 PCP: [name], MD  Brief narrative: 80 year old female with past medical history of hypertension, diabetes, dementia, status post angioplasty of the left peroneal artery due to PVD ( *0% Stenosis) by Dr. Myra Gianotti on 05/27/2013 who presented to [facility] ED 05/30/2013 from home with reports of slurred speech and left side weakness. Slurred speech has improved but weakness is still present. In addition, patient had 2 falls at home prior to this admission.In ED, CT head was negative for acute intracranial findings.  Assessment/Plan:  Principal Problem:   Left-sided weakness, slurred speach - TIA versus CVA; left side weakness still present and slurred speech resolved - CT head showed no acute intracranial findings - MRI brain is pending - needs PT/OT eval - follow up 2 D ECHO and carotid doppler Active Problems:   Hypertension - continue Norvasc 10 mg daily   Dyslipidemia - continue Lipitor 10 mg daily   DM2 (diabetes mellitus, type 2) - check A1c - continue insulin 30 units ina ma and 20 units at supper; continue glipizide - continue SSI   Diabetic neuropathy - continue gabapentin   Peripheral vascular disease, unspecified - had recent angioplasty   Dementia - continue aricept   CKD (chronic kidney disease), stage III - baseline creatinine 1.2 in 2014 - this admission, creatinine 1.5 and trending up to 1.7 - was given IV fluids on admission - continue to monitor renal function  Code Status: full code Family Communication: no family at the bedside Disposition Plan: needs PT eval  Manson Passey, MD  [facility] Pager [phone]  If 7PM-7AM, please contact night-coverage [email or web address] Password [email or web address] 05/30/2013, 8:02 AM   LOS: 0 days   Consultants:  None   Procedures:  None   Antibiotics:  None   HPI/Subjective: No acute overnight events.  Objective: Filed  Vitals:   05/30/13 0015 05/30/13 0115 05/30/13 0140 05/30/13 0310  BP: 131/82 129/88  133/86  Pulse: 60 64  80  Temp:   97.5 F (36.4 C) 97.9 F (36.6 C)  TempSrc:    Oral  Resp: 12 19  16   Height:    5\' 4"  (1.626 m)  Weight:    58.423 kg (128 lb 12.8 oz)  SpO2: 97% 97%  95%   No intake or output data in the 24 hours ending 05/30/13 0802  Exam:   General:  Pt is alert, follows commands appropriately, not in acute distress  Cardiovascular: Regular rate and rhythm, S1/S2, no murmurs, no rubs, no gallops  Respiratory: Clear to auscultation bilaterally, no wheezing, no crackles, no rhonchi  Abdomen: Soft, non tender, non distended, bowel sounds present, no guarding  Extremities: No edema, pulses DP and PT palpable bilaterally  Neuro: upper extremity no strength left; LE somewhat able to lift it up against gravity; speech at baseline, left facial droop mild  Data Reviewed: Basic Metabolic Panel:  Recent Labs Lab 05/26/13 0611 05/30/13 0014 05/30/13 0026  NA 140 138 139  K 4.0 4.7 4.6  CL 100 95* 99  CO2  --  29  --   GLUCOSE 304* 276* 272*  BUN 29* 37* 37*  CREATININE 1.30* 1.59* 1.70*  CALCIUM  --  9.9  --    Liver Function Tests:  Recent Labs Lab 05/30/13 0014  AST 15  ALT 12  ALKPHOS 102  BILITOT <0.2*  PROT 7.7  ALBUMIN 3.1*   No results found for this basename: LIPASE, AMYLASE,  in the last 168 hours No results found for this basename: AMMONIA,  in the last 168 hours CBC:  Recent Labs Lab 05/26/13 0611 05/30/13 0014 05/30/13 0026  WBC  --  7.9  --   NEUTROABS  --  4.4  --   HGB 14.6 12.9 13.9  HCT 43.0 38.7 41.0  MCV  --  88.2  --   PLT  --  226  --    Cardiac Enzymes: No results found for this basename: CKTOTAL, CKMB, CKMBINDEX, TROPONINI,  in the last 168 hours BNP: No components found with this basename: POCBNP,  CBG:  Recent Labs Lab 05/26/13 0928 05/30/13 0308  GLUCAP 226* 291*    No results found for this or any previous  visit (from the past 240 hour(s)).   Studies: Ct Head (brain) Wo Contrast 05/30/2013   IMPRESSION: No evidence of acute intracranial abnormality.  Age related atrophy, small-vessel ischemic changes, and intracranial atherosclerosis.      Scheduled Meds: . allopurinol  100 mg Oral BID  . amLODipine  10 mg Oral Daily  . atorvastatin  10 mg Oral q1800  . donepezil  10 mg Oral QHS  . enoxaparin (LOVENOX)  30 mg Subcutaneous Q24H  . gabapentin  300 mg Oral TID  . glipiZIDE  5 mg Oral BID AC  . insulin aspart  0-9 Units Subcutaneous 6 times per day  . insulin aspart protamine-   20 Units Subcutaneous Q supper  . insulin aspart protamine-  30 Units Subcutaneous Q breakfast  . loratadine  10 mg Oral Daily

## 2013-05-31 ENCOUNTER — Inpatient Hospital Stay (HOSPITAL_COMMUNITY): Payer: Medicare Other

## 2013-05-31 DIAGNOSIS — N183 Chronic kidney disease, stage 3 unspecified: Secondary | ICD-10-CM

## 2013-05-31 DIAGNOSIS — I369 Nonrheumatic tricuspid valve disorder, unspecified: Secondary | ICD-10-CM

## 2013-05-31 LAB — GLUCOSE, CAPILLARY
Glucose-Capillary: 61 mg/dL — ABNORMAL LOW (ref 70–99)
Glucose-Capillary: 76 mg/dL (ref 70–99)
Glucose-Capillary: 89 mg/dL (ref 70–99)
Glucose-Capillary: 160 mg/dL — ABNORMAL HIGH (ref 70–99)
Glucose-Capillary: 193 mg/dL — ABNORMAL HIGH (ref 70–99)
Glucose-Capillary: 231 mg/dL — ABNORMAL HIGH (ref 70–99)

## 2013-05-31 MED ORDER — INSULIN ASPART PROT & ASPART (70-30 MIX) 100 UNIT/ML ~~LOC~~ SUSP
25.0000 [IU] | Freq: Every day | SUBCUTANEOUS | Status: DC
  Administered 2013-06-01 – 2013-06-02 (×2): 25 [IU] via SUBCUTANEOUS
  Filled 2013-05-31: qty 10

## 2013-05-31 MED ORDER — HALOPERIDOL LACTATE 5 MG/ML IJ SOLN
2.0000 mg | Freq: Four times a day (QID) | INTRAMUSCULAR | Status: DC | PRN
  Administered 2013-05-31: 2 mg via INTRAVENOUS
  Filled 2013-05-31: qty 1

## 2013-05-31 MED ORDER — INSULIN ASPART PROT & ASPART (70-30 MIX) 100 UNIT/ML ~~LOC~~ SUSP
15.0000 [IU] | Freq: Every day | SUBCUTANEOUS | Status: DC
  Administered 2013-05-31 – 2013-06-01 (×2): 15 [IU] via SUBCUTANEOUS

## 2013-05-31 NOTE — Evaluation (Signed)
Physical Therapy Evaluation Patient Details Name: [name] MRN: [id number] DOB: [birth year] Today's Date: 05/31/2013   History of Present Illness  Pt admitted with left-sided weakness.  CVA suspected.  CT was negative.  Awaiting MRI.  Clinical Impression  Pt admitted with left-side weakness. Pt currently with functional limitations due to the deficits listed below (see PT Problem List). Awaiting MRI results, but CVA suspected.  CT was negative. Pt will benefit from skilled PT to increase their independence and safety with mobility to allow discharge to the venue listed below.       Follow Up Recommendations CIR    Equipment Recommendations  Other (comment) (TBD at next venue)    Recommendations for Other Services Rehab consult     Precautions / Restrictions Precautions Precautions: Fall      Mobility  Bed Mobility Overal bed mobility: Needs Assistance Bed Mobility: Supine to Sit     Supine to sit: Mod assist        Transfers Overall transfer level: Needs assistance   Transfers: Sit to/from Stand;Stand Pivot Transfers Sit to Stand: Mod assist Stand pivot transfers: Mod assist       General transfer comment: verbal cues for sequencing, transfer toward right  Ambulation/Gait                Stairs            Wheelchair Mobility    Modified Rankin (Stroke Patients Only)       Balance Overall balance assessment: Needs assistance Sitting-balance support: Single extremity supported;Feet supported Sitting balance-Leahy Scale: Poor   Postural control: Posterior lean Standing balance support: Single extremity supported Standing balance-Leahy Scale: Zero                       Pertinent Vitals/Pain 0/10    Home Living Family/patient expects to be discharged to:: Private residence Living Arrangements: Alone Available Help at Discharge: Family;Available PRN/intermittently Type of Home: Apartment Home Access: Level entry      Home Layout: One level Home Equipment: Walker - 2 wheels      Prior Function Level of Independence: Independent with assistive device(s)               Hand Dominance        Extremity/Trunk Assessment   Upper Extremity Assessment: Defer to OT evaluation           Lower Extremity Assessment: LLE deficits/detail   LLE Deficits / Details: grossly 2/5-3/5     Communication   Communication: No difficulties  Cognition Arousal/Alertness: Awake/alert Behavior During Therapy: WFL for tasks assessed/performed Overall Cognitive Status: Within Functional Limits for tasks assessed                      General Comments      Exercises        Assessment/Plan    PT Assessment Patient needs continued PT services  PT Diagnosis Abnormality of gait   PT Problem List Decreased strength;Decreased activity tolerance;Decreased balance;Decreased mobility;Decreased coordination;Decreased safety awareness  PT Treatment Interventions DME instruction;Gait training;Functional mobility training;Therapeutic activities;Therapeutic exercise;Balance training;Neuromuscular re-education;Patient/family education;Wheelchair mobility training   PT Goals (Current goals can be found in the Care Plan section) Acute Rehab PT Goals Patient Stated Goal: home PT Goal Formulation: With patient Time For Goal Achievement: 06/14/13 Potential to Achieve Goals: Good    Frequency Min 4X/week   Barriers to discharge Decreased caregiver support      End of Session Equipment  Utilized During Treatment: Gait belt Activity Tolerance: Patient tolerated treatment well Patient left: in chair;with call bell/phone within reach;with chair alarm set         Time: 1150-1210 PT Time Calculation (min): 20 min   Charges:   PT Evaluation $Initial PT Evaluation Tier I: 1 Procedure PT Treatments $Therapeutic Activity: 8-22 mins   PT G Codes:          Ilda Foil 05/31/2013, 1:23  PM  Aida Raider, PT  Office # [phone] Pager [phone]

## 2013-05-31 NOTE — Progress Notes (Signed)
VASCULAR LAB PRELIMINARY  PRELIMINARY  PRELIMINARY  PRELIMINARY  Carotid duplex completed.    Preliminary report:  Bilateral:  1-39% ICA stenosis.  Vertebral artery flow is antegrade.     Jeffree Cazeau, RVS 05/31/2013, 11:29 AM

## 2013-05-31 NOTE — Progress Notes (Signed)
Echocardiogram 2D Echocardiogram has been performed.  [name] BasemanReel, [name] M 05/31/2013, 8:03 AM

## 2013-05-31 NOTE — Progress Notes (Addendum)
[facility] PROGRESS NOTE  Prentice Dockerloiers Enerson AVW:[id number] DOB: [birth year] DOA: 05/30/2013 PCP: Dorrene GermanAVBUERE,EDWIN A, MD  Brief narrative: 80 year old female with past medical history of hypertension, diabetes, dementia, status post angioplasty of the left peroneal artery due to PVD ( *0% Stenosis) by Dr. Myra GianottiBrabham on 05/27/2013 who presented [facility] ED 05/30/2013 from home with reports of slurred speech and left side weakness. Slurred speech has improved but weakness is still present. In addition, patient had 2 falls at home prior to this admission.In ED, CT head was negative for acute intracranial findings.   Assessment/Plan:   Principal Problem:  Left-sided weakness, slurred speach  - TIA versus CVA; No acute findings on CT head; MRI brain is still pending - slurred speech resolved but has ongoing LUE nad LLE weakness - follow up 2 D ECHO and carotid doppler study  Active Problems:  Hypertension  - continue Norvasc 10 mg daily  - BP 131/65 Dyslipidemia  - continue Lipitor 10 mg daily  DM2 (diabetes mellitus, type 2) with complications of neuropathy  - A1c - 10 on this admission indicating poor glycemic control - current insulin regimen: insulin 30 units in am and 20 units at supper; continue glipizide  - continue SSI; CBG's in past 24 hours: 61, 76, 89; we will decrease insulin dose due to these lower CBG's Diabetic neuropathy  - continue gabapentin  Peripheral vascular disease, unspecified  - had recent angioplasty  Dementia, acute encephalopathy - continue aricept  Gout - continue allopurinol CKD (chronic kidney disease), stage III  - baseline creatinine 1.2 in 2014  - this admission, creatinine 1.5 and trending up to 1.7  - check renal function in am  Code Status: full code  Family Communication: no family at the bedside  Disposition Plan: follow up recommendations from PT evaluation   Consultants:  None  Procedures:  None  Antibiotics:  None    Manson PasseyEVINE, Saskia Simerson,  MD  [facility] Pager [phone]  If 7PM-7AM, please contact night-coverage [email or web address] Password [email or web address] 05/31/2013, 7:10 AM   LOS: 1 day   HPI/Subjective: Little agitated this am, somewhat confused.   Objective: Filed Vitals:   05/30/13 1600 05/30/13 2000 05/30/13 2354 05/31/13 0405  BP: 109/71 128/62 125/62 135/73  Pulse: 55 74 74 67  Temp: 99.4 F (37.4 C) 98.1 F (36.7 C) 98.6 F (37 C) 97.8 F (36.6 C)  TempSrc:   Oral Oral  Resp: 18 18 18 18   Height:      Weight:      SpO2: 96% 95% 98% 100%    Intake/Output Summary (Last 24 hours) at 05/31/13 0710 Last data filed at 05/31/13 0600  Gross per 24 hour  Intake    340 ml  Output    600 ml  Net   -260 ml    Exam:   General:  Pt is alert, agitated  Cardiovascular: Regular rate and rhythm, S1/S2 appreciated  Respiratory: Clear to auscultation bilaterally, no wheezing, no crackles, no rhonchi  Abdomen: Soft, non tender, non distended, bowel sounds present, no guarding  Extremities: No edema, pulses DP and PT palpable bilaterally  Neuro: LE upper and lower weakness  Data Reviewed: Basic Metabolic Panel:  Recent Labs Lab 05/26/13 0611 05/30/13 0014 05/30/13 0026  NA 140 138 139  K 4.0 4.7 4.6  CL 100 95* 99  CO2  --  29  --   GLUCOSE 304* 276* 272*  BUN 29* 37* 37*  CREATININE 1.30* 1.59* 1.70*  CALCIUM  --  9.9  --    Liver Function Tests:  Recent Labs Lab 05/30/13 0014  AST 15  ALT 12  ALKPHOS 102  BILITOT <0.2*  PROT 7.7  ALBUMIN 3.1*   No results found for this basename: LIPASE, AMYLASE,  in the last 168 hours No results found for this basename: AMMONIA,  in the last 168 hours CBC:  Recent Labs Lab 05/26/13 0611 05/30/13 0014 05/30/13 0026  WBC  --  7.9  --   NEUTROABS  --  4.4  --   HGB 14.6 12.9 13.9  HCT 43.0 38.7 41.0  MCV  --  88.2  --   PLT  --  226  --    Cardiac Enzymes: No results found for this basename: CKTOTAL, CKMB, CKMBINDEX, TROPONINI,  in the last  168 hours BNP: No components found with this basename: POCBNP,  CBG:  Recent Labs Lab 05/30/13 1145 05/30/13 1635 05/30/13 2053 05/30/13 2352 05/31/13 0400  GLUCAP 158* 153* 97 61* 76    No results found for this or any previous visit (from the past 240 hour(s)).   Studies: Dg Chest 2 View 05/30/2013   IMPRESSION: Cardiomegaly.  Marked enlargement and tortuosity of the thoracic aorta.  Unchanged calcified granulomas and mediastinal lymph nodes.     Ct Head (brain) Wo Contrast 05/30/2013  IMPRESSION: No evidence of acute intracranial abnormality.  Age related atrophy, small-vessel ischemic changes, and intracranial atherosclerosis.      Scheduled Meds: . allopurinol  100 mg Oral BID  . amLODipine  10 mg Oral Daily  . atorvastatin  10 mg Oral q1800  . donepezil  10 mg Oral QHS  . enoxaparin (LOVENOX)  30 mg Subcutaneous Q24H  . gabapentin  300 mg Oral TID  . glipiZIDE  5 mg Oral BID AC  . insulin aspart  0-9 Units Subcutaneous 6 times per day  . insulin aspart protamine  20 Units Subcutaneous Q supper  . insulin aspart protamine  30 Units Subcutaneous Q breakfast  . loratadine  10 mg Oral Daily

## 2013-06-01 DIAGNOSIS — I6330 Cerebral infarction due to thrombosis of unspecified cerebral artery: Secondary | ICD-10-CM

## 2013-06-01 LAB — GLUCOSE, CAPILLARY
GLUCOSE-CAPILLARY: 102 mg/dL — AB (ref 70–99)
GLUCOSE-CAPILLARY: 168 mg/dL — AB (ref 70–99)
GLUCOSE-CAPILLARY: 196 mg/dL — AB (ref 70–99)
Glucose-Capillary: 161 mg/dL — ABNORMAL HIGH (ref 70–99)
Glucose-Capillary: 163 mg/dL — ABNORMAL HIGH (ref 70–99)
Glucose-Capillary: 101 mg/dL — ABNORMAL HIGH (ref 70–99)
Glucose-Capillary: 264 mg/dL — ABNORMAL HIGH (ref 70–99)

## 2013-06-01 MED ORDER — ASPIRIN EC 81 MG PO TBEC
81.0000 mg | DELAYED_RELEASE_TABLET | Freq: Every day | ORAL | Status: DC
  Administered 2013-06-01 – 2013-06-02 (×2): 81 mg via ORAL
  Filled 2013-06-01 (×3): qty 1

## 2013-06-01 NOTE — Progress Notes (Signed)
Physical Therapy Treatment Patient Details Name: [name] MRN: [id number] DOB: 1917-09-13 Today's Date: 06/01/2013    History of Present Illness 80 y.o[place] [facility] 05[facility] with left sided weakness.  CT scan was negative and MRI revealed acute lacunar infarct extending from the right corona radiata through the right lentiform nuclei to the right external capsule.    PT Comments    Pt is progressing well with therapy and was able to tolerate some pre gait in standing.  With two person assist she may be able to start progressing gait with L PFRW next session.  She continues to be an excellent rehab candidate.    Follow Up Recommendations  CIR     Equipment Recommendations  Rolling walker with 5" wheels;Wheelchair (measurements PT);Wheelchair cushion (measurements PT) (L PFRW)    Recommendations for Other Services   None     Precautions / Restrictions Precautions Precautions: Fall Precaution Comments: left hemiperesis, posterior and left leaner/mild pusher    Mobility  Bed Mobility Overal bed mobility: Needs Assistance Bed Mobility: Supine to Sit;Sit to Supine     Supine to sit: Mod assist Sit to supine: Min assist   General bed mobility comments: Mod assist to support trunk to get to sitting EOB. Assist also needed to move her left leg over the side of the bed.  Verbal cues for sequencing and rail use.  Min assist to help with her left leg when going back to supine.    Transfers Overall transfer level: Needs assistance Equipment used:  (placed chair backwards on her right side) Transfers: Sit to/from Stand Sit to Stand: Mod assist         General transfer comment: Mod assist to block left knee and support trunk during transitions.  Once standing pt using chair as a support and also for contact with her right hip and side body to help decrease pushing posteriorly and to the left.  Verbal cues for midline, upright posture and hip  extension.       Modified Rankin (Stroke Patients Only) Modified Rankin (Stroke Patients Only) Pre-Morbid Rankin Score: No significant disability Modified Rankin: Severe disability     Balance Overall balance assessment: Needs assistance Sitting-balance support: Feet supported;Single extremity supported Sitting balance-Leahy Scale: Fair Sitting balance - Comments: In sitting worked on finding and holding midline.  Pt leaning posteriorly and to the left in sitting as well.   Postural control: Posterior lean;Left lateral lean Standing balance support: Single extremity supported Standing balance-Leahy Scale: Poor Standing balance comment: In standing worked on finding midline and pre-gait stepping forward and backwards with her right foot at EOB with one hand on the back of a chair and her left leg blocked.                      Cognition Arousal/Alertness: Awake/alert Behavior During Therapy: WFL for tasks assessed/performed Overall Cognitive Status: Within Functional Limits for tasks assessed                             Pertinent Vitals/Pain See vitals flow sheet.            PT Goals (current goals can now be found in the care plan section) Acute Rehab PT Goals Patient Stated Goal: home Progress towards PT goals: Progressing toward goals    Frequency  Min 4X/week    PT Plan Current plan remains appropriate  End of Session Equipment Utilized During Treatment: Gait belt Activity Tolerance: Patient tolerated treatment well Patient left: in bed;with call bell/phone within reach;with bed alarm set     Time: 1324-4010 PT Time Calculation (min): 35 min  Charges:  $Therapeutic Activity: 8-22 mins $Neuromuscular Re-education: 8-22 mins                     Diane Mochizuki B. Krishan Mcbreen, PT, DPT [phone]   06/01/2013, 4:15 PM

## 2013-06-01 NOTE — Consult Note (Signed)
Physical Medicine and Rehabilitation Consult Reason for Consult: CVA Referring Physician: [facility]   HPI: [name] is a 80 y.o. right-handed female with history of dementia, hypertension, diabetes mellitus with peripheral neuropathy. Patient with recent angioplasty of the left peroneal artery due to peripheral vascular disease 05/26/2013 per Dr. Myra Gianotti. Patient independent prior to admission living alone using a walker. Admitted 05/30/2013 of left-sided weakness and slurred speech. Patient observed also had 2 falls during the day prior to admission. No loss of consciousness reported. MRI of the brain showed acute lacunar type infarct extending from the right corona radiata through the right lentiform nuclear to the external capsule. MRA of the brain with no major MCA branch occlusions. Echocardiogram with ejection fraction 65% grade 1 diastolic dysfunction. Carotid Dopplers with no ICA stenosis. Currently maintained on Lovenox for DVT prophylaxis. Dysphagia 2 thin liquid diet. Physical therapy evaluation completed 05/31/2013 with recommendations for physical medicine rehabilitation consult.   Review of Systems  Gastrointestinal:       GERD  Musculoskeletal: Positive for falls and myalgias.  Neurological:       Vertigo  Psychiatric/Behavioral: Positive for memory loss.       Mood disorder  All other systems reviewed and are negative.   Past Medical History  Diagnosis Date  . UTI (urinary tract infection)   . Hypertension   . Diabetes mellitus   . Gout   . High cholesterol   . Complication of anesthesia   . Diabetic neuropathy   . PVD (peripheral vascular disease)   . Carotid artery occlusion   . Arthritis   . Chronic kidney disease   . GERD (gastroesophageal reflux disease)   . Vertigo   . Venous insufficiency   . Mood disorder   . Dysphagia    Past Surgical History  Procedure Laterality Date  . Hemorrhoid banding    . Abdominal hysterectomy    . Cardiac  catheterization     Family History  Problem Relation Age of Onset  . Heart disease Daughter   . Hypertension Daughter   . Diabetes Son   . Heart disease Son    Social History:  reports that she has never smoked. She has never used smokeless tobacco. She reports that she does not drink alcohol or use illicit drugs. Allergies: No Known Allergies Medications Prior to Admission  Medication Sig Dispense Refill  . allopurinol (ZYLOPRIM) 100 MG tablet Take 100 mg by mouth 2 (two) times daily.      Marland Kitchen amLODipine (NORVASC) 10 MG tablet Take 1 tablet (10 mg total) by mouth daily.  30 tablet  3  . aspirin EC 81 MG EC tablet Take 1 tablet (81 mg total) by mouth daily.      . cadexomer iodine (IODOSORB) 0.9 % gel Apply 1 application topically daily as needed for wound care (to big toe).      . cetirizine (ZYRTEC) 10 MG tablet Take 10 mg by mouth daily.      Marland Kitchen donepezil (ARICEPT) 10 MG tablet Take 10 mg by mouth at bedtime.       . gabapentin (NEURONTIN) 300 MG capsule Take 300 mg by mouth 3 (three) times daily.       Marland Kitchen glipiZIDE (GLUCOTROL) 5 MG tablet Take 5 mg by mouth 2 (two) times daily before a meal.      . HYDROcodone-acetaminophen (NORCO/VICODIN) 5-325 MG per tablet Take 1 tablet by mouth every 6 (six) hours as needed for moderate pain.      Marland Kitchen  Insulin Lispro Prot & Lispro (HUMALOG MIX 75/25 KWIKPEN) (75-25) 100 UNIT/ML SUPN Inject 20-30 Units into the skin 2 (two) times daily. Use 30 units every morning and 20 units every evening      . phenylephrine-shark liver oil-mineral oil-petrolatum (PREPARATION H) 0.25-3-14-71.9 % rectal ointment Place 1 application rectally 2 (two) times daily as needed for hemorrhoids.      . rosuvastatin (CRESTOR) 5 MG tablet Take 5 mg by mouth daily.      . Vortioxetine HBr (BRINTELLIX) 10 MG TABS Take 10 mg by mouth daily.       . Wheat Dextrin (BENEFIBER DRINK MIX) PACK Take 1 packet by mouth daily as needed (stool).         Home: Home Living Family/patient  expects to be discharged to:: Private residence Living Arrangements: Alone Available Help at Discharge: Family;Available PRN/intermittently Type of Home: Apartment Home Access: Level entry Home Layout: One level Home Equipment: Environmental consultantWalker - 2 wheels  Lives With: Alone  Functional History: Prior Function Level of Independence: Independent with assistive device(s) Functional Status:  Mobility: Bed Mobility Overal bed mobility: Needs Assistance Bed Mobility: Supine to Sit Supine to sit: Mod assist Transfers Overall transfer level: Needs assistance Transfers: Sit to/from Stand;Stand Pivot Transfers Sit to Stand: Mod assist Stand pivot transfers: Mod assist General transfer comment: verbal cues for sequencing, transfer toward right      ADL:    Cognition: Cognition Overall Cognitive Status: Within Functional Limits for tasks assessed Orientation Level: Oriented X4 Cognition Arousal/Alertness: Awake/alert Behavior During Therapy: WFL for tasks assessed/performed Overall Cognitive Status: Within Functional Limits for tasks assessed  Blood pressure 150/75, pulse 71, temperature 97.9 F (36.6 C), temperature source Oral, resp. rate 17, height 5\' 4"  (1.626 m), weight 128 lb 12.8 oz (58.423 kg), SpO2 96.00%. Physical Exam  Vitals reviewed. HENT:  Head: Normocephalic.  edentulous  Eyes: EOM are normal.  Neck: Normal range of motion. Neck supple. No thyromegaly present.  Cardiovascular: Normal rate and regular rhythm.   Respiratory: Effort normal and breath sounds normal. No respiratory distress.  GI: Soft. Bowel sounds are normal. She exhibits no distension.  Neurological: She is alert.  Patient makes good eye contact with examiner. She was able to identify her left side weakness. Speech was dysarthric but intelligible. Alert and oriented x3. She follows simple commands. Left central 7 and tongue deviation. LUE 0/5 delt, bicep, tricep, HI. LLE 2/5 HF, KE 2+, ankle df/pf 1 to 1+.  Sensation intact to LT and PP. Seems to have reasonable insight and awareness. There is a small language barrier  Skin: Skin is warm and dry.    Results for orders placed during the hospital encounter of 05/30/13 (from the past 24 hour(s))  GLUCOSE, CAPILLARY     Status: Abnormal   Collection Time    05/31/13 12:22 PM      Result Value Ref Range   Glucose-Capillary 160 (*) 70 - 99 mg/dL   Comment 1 Documented in Chart     Comment 2 Notify RN    GLUCOSE, CAPILLARY     Status: Abnormal   Collection Time    05/31/13  5:11 PM      Result Value Ref Range   Glucose-Capillary 193 (*) 70 - 99 mg/dL   Comment 1 Documented in Chart     Comment 2 Notify RN    GLUCOSE, CAPILLARY     Status: Abnormal   Collection Time    05/31/13  8:29 PM  Result Value Ref Range   Glucose-Capillary 231 (*) 70 - 99 mg/dL   Comment 1 Notify RN    GLUCOSE, CAPILLARY     Status: Abnormal   Collection Time    05/31/13 11:51 PM      Result Value Ref Range   Glucose-Capillary 196 (*) 70 - 99 mg/dL   Comment 1 Notify RN    GLUCOSE, CAPILLARY     Status: Abnormal   Collection Time    06/01/13  5:11 AM      Result Value Ref Range   Glucose-Capillary 168 (*) 70 - 99 mg/dL   Comment 1 Notify RN    GLUCOSE, CAPILLARY     Status: Abnormal   Collection Time    06/01/13  7:44 AM      Result Value Ref Range   Glucose-Capillary 161 (*) 70 - 99 mg/dL   Mr Brain Wo Contrast  05/31/2013   CLINICAL DATA:  80 year old female with slurred speech and left side weakness. Stroke. Initial encounter.  EXAM: MRI HEAD WITHOUT CONTRAST  MRA HEAD WITHOUT CONTRAST  TECHNIQUE: Multiplanar, multiecho pulse sequences of the brain and surrounding structures were obtained without intravenous contrast. Angiographic images of the head were obtained using MRA technique without contrast.  COMPARISON:  Head CT without contrast 05/30/2013. Brain MRI 11/22/2011.  FINDINGS: MRI HEAD FINDINGS  The examination had to be discontinued prior to  completion. Also, the study is intermittently degraded by motion artifact despite repeated imaging attempts. .  Cerebral volume is not significantly changed since 2013. Major intracranial vascular flow voids are stable.  There is a curvilinear area of restricted diffusion tracking from the posterior right corona radiata through the lentiform nuclei to the right posterior limb external capsule (series 6, image 16). T2 hyperintensity without mass effect or evidence of associated hemorrhage.  No contralateral or posterior fossa restricted diffusion.  Chronic cerebellar and brainstem lacunar infarcts. Patchy and confluent cerebral white matter and deep gray matter T2 hyperintensity. No midline shift, mass effect, or evidence of intracranial mass lesion. No ventriculomegaly. Stable basilar cisterns. Stable pituitary, cervicomedullary junction and visualized cervical spine. No extra-axial collection. Stable orbits soft tissues. Chronic maxillary sinus inflammatory changes. Other paranasal sinuses and mastoids remain clear. Bone marrow signal within normal limits.  MRA HEAD FINDINGS  Antegrade flow in the posterior circulation with codominant distal vertebral arteries. Tortuous distal vertebral arteries with dolichoectatic vertebrobasilar junction. Small 3 mm aneurysm at the left AICA origin (series 5, image 58, series 507, image 6).  Moderate to severe basilar artery irregularity but no hemodynamically significant basilar artery stenosis. There is a 7 mm cephalad directed basilar tip aneurysm (series 5, image 91, series 506, image 4). Moderate to severe irregularity and stenosis at both PCA origins. Nonetheless, distal PCA flow is preserved. Normal right posterior communicating artery, the left is diminutive.  Antegrade flow in both ICA siphons. Tortuous cavernous segments. Moderate to severely irregular supra clinoid segments with mild stenosis. Ophthalmic artery origins are within normal limits. Carotid termini are  patent. MCA and ACA origins are patent.  Tortuous proximal ACA branches. Bilateral MCA M1 segments are patent with atherosclerotic irregularity left greater than right. Both MCA bifurcations are patent. Motion artifact degrades detail of the bilateral MCA and ACA branches. No major MCA branch occlusion is evident.  IMPRESSION: 1. Acute lacunar type infarct extending from the right corona radiata through the right lentiform nuclei to the right external capsule. No mass effect or evidence of associated hemorrhage. 2. Intracranial atherosclerosis  with no major circle of Willis branch occlusion. 3. 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 4. Chronic small vessel ischemia.   Electronically Signed   By: Augusto Gamble M.D.   On: 05/31/2013 20:47   Mr Maxine Glenn Head/brain Wo Cm  05/31/2013   CLINICAL DATA:  80 year old female with slurred speech and left side weakness. Stroke. Initial encounter.  EXAM: MRI HEAD WITHOUT CONTRAST  MRA HEAD WITHOUT CONTRAST  TECHNIQUE: Multiplanar, multiecho pulse sequences of the brain and surrounding structures were obtained without intravenous contrast. Angiographic images of the head were obtained using MRA technique without contrast.  COMPARISON:  Head CT without contrast 05/30/2013. Brain MRI 11/22/2011.  FINDINGS: MRI HEAD FINDINGS  The examination had to be discontinued prior to completion. Also, the study is intermittently degraded by motion artifact despite repeated imaging attempts. .  Cerebral volume is not significantly changed since 2013. Major intracranial vascular flow voids are stable.  There is a curvilinear area of restricted diffusion tracking from the posterior right corona radiata through the lentiform nuclei to the right posterior limb external capsule (series 6, image 16). T2 hyperintensity without mass effect or evidence of associated hemorrhage.  No contralateral or posterior fossa restricted diffusion.  Chronic cerebellar and brainstem  lacunar infarcts. Patchy and confluent cerebral white matter and deep gray matter T2 hyperintensity. No midline shift, mass effect, or evidence of intracranial mass lesion. No ventriculomegaly. Stable basilar cisterns. Stable pituitary, cervicomedullary junction and visualized cervical spine. No extra-axial collection. Stable orbits soft tissues. Chronic maxillary sinus inflammatory changes. Other paranasal sinuses and mastoids remain clear. Bone marrow signal within normal limits.  MRA HEAD FINDINGS  Antegrade flow in the posterior circulation with codominant distal vertebral arteries. Tortuous distal vertebral arteries with dolichoectatic vertebrobasilar junction. Small 3 mm aneurysm at the left AICA origin (series 5, image 58, series 507, image 6).  Moderate to severe basilar artery irregularity but no hemodynamically significant basilar artery stenosis. There is a 7 mm cephalad directed basilar tip aneurysm (series 5, image 91, series 506, image 4). Moderate to severe irregularity and stenosis at both PCA origins. Nonetheless, distal PCA flow is preserved. Normal right posterior communicating artery, the left is diminutive.  Antegrade flow in both ICA siphons. Tortuous cavernous segments. Moderate to severely irregular supra clinoid segments with mild stenosis. Ophthalmic artery origins are within normal limits. Carotid termini are patent. MCA and ACA origins are patent.  Tortuous proximal ACA branches. Bilateral MCA M1 segments are patent with atherosclerotic irregularity left greater than right. Both MCA bifurcations are patent. Motion artifact degrades detail of the bilateral MCA and ACA branches. No major MCA branch occlusion is evident.  IMPRESSION: 1. Acute lacunar type infarct extending from the right corona radiata through the right lentiform nuclei to the right external capsule. No mass effect or evidence of associated hemorrhage. 2. Intracranial atherosclerosis with no major circle of Willis branch  occlusion. 3. 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 4. Chronic small vessel ischemia.   Electronically Signed   By: Augusto Gamble M.D.   On: 05/31/2013 20:47    Assessment/Plan: Diagnosis: right basal ganglia infarct 1. Does the need for close, 24 hr/day medical supervision in concert with the patient's rehab needs make it unreasonable for this patient to be served in a less intensive setting? Yes 2. Co-Morbidities requiring supervision/potential complications: htn, dm, DPN, ckd 3. Due to bladder management, bowel management, safety, skin/wound care, disease management, medication  administration, pain management and patient education, does the patient require 24 hr/day rehab nursing? Yes 4. Does the patient require coordinated care of a physician, rehab nurse, PT (1-2 hrs/day, 5 days/week), OT (1-2 hrs/day, 5 days/week) and SLP (1-2 hrs/day, 5 days/week) to address physical and functional deficits in the context of the above medical diagnosis(es)? Yes Addressing deficits in the following areas: balance, endurance, locomotion, strength, transferring, bowel/bladder control, bathing, dressing, feeding, grooming, toileting, speech, swallowing and psychosocial support 5. Can the patient actively participate in an intensive therapy program of at least 3 hrs of therapy per day at least 5 days per week? Yes 6. The potential for patient to make measurable gains while on inpatient rehab is excellent 7. Anticipated functional outcomes upon discharge from inpatient rehab are modified independent and supervision  with PT, modified independent and min assist with OT, modified independent with SLP. 8. Estimated rehab length of stay to reach the above functional goals is: 12-18 days 9. Does the patient have adequate social supports to accommodate these discharge functional goals? Yes 10. Anticipated D/C setting: Home 11. Anticipated post D/C treatments: HH therapy 12. Overall  Rehab/Functional Prognosis: excellent  RECOMMENDATIONS: This patient's condition is appropriate for continued rehabilitative care in the following setting: CIR Patient has agreed to participate in recommended program. Yes and Potentially Note that insurance prior authorization may be required for reimbursement for recommended care.  Comment: Rehab Admissions Coordinator to follow up.  Thanks,  Ranelle Oyster, MD, [place] [facility]     06/01/2013

## 2013-06-01 NOTE — Progress Notes (Signed)
Inpatient Diabetes Program Recommendations  AACE/ADA: New Consensus Statement on Inpatient Glycemic Control (2013)  Target Ranges:  Prepandial:   less than 140 mg/dL      Peak postprandial:   less than 180 mg/dL (1-2 hours)      Critically ill patients:  140 - 180 mg/dL     Results for [name], Arlena (MRN [id number]) as of 06/01/2013 07:48  Ref. Range 05/30/2013 23:52 05/31/2013 04:00 05/31/2013 07:56 05/31/2013 12:22 05/31/2013 17:11 05/31/2013 20:29  Glucose-Capillary Latest Range: 70-99 mg/dL 61 (L) 76 89 578160 (H) 469193 (H) 231 (H)    Results for [name], Danyel (MRN [id number]) as of 06/01/2013 07:48  Ref. Range 05/30/2013 05:15  Hemoglobin A1C Latest Range: <5.7 % 10.0 (H)    Diabetes history: Type 2 DM Home DM Meds:  Humalog 75/25 insulin- 30 units AM/ 20 units PM + Glipizide 5 mg bid    **Patient admitted with CVA.  History of DM2, HTN, CKD 3, Dementia.  A1c shows poor control prior to hospitalization.  **Patient started on PO dysphagic diet.  Eating 100% of meals so far.  **Note MD made adjustments to 70/30 insulin   MD- Please change Novolog Sensitive SSI to tid ac + HS (currently ordered as Q4 hours and patient is eating PO diet)    Will follow. Ambrose FinlandJeannine Johnston Kedarius Aloisi RN, MSN, CDE Diabetes Coordinator Inpatient Diabetes Program Team Pager: [phone] (8a-10p)

## 2013-06-01 NOTE — Progress Notes (Signed)
[facility] PROGRESS NOTE  [name] ZOX:[id number] DOB: 1917-10-14 DOA: 05/30/2013 PCP: [name], MD  Brief narrative: 80 year old female with past medical history of hypertension, diabetes, dementia, status post angioplasty of the left peroneal artery due to PVD ( *0% Stenosis) by Dr. Myra Gianotti on 05/27/2013 who presented to [facility] ED 05/30/2013 from home with reports of slurred speech and left side weakness. Slurred speech has improved but weakness is still present. In addition, patient had 2 falls at home prior to this admission.In ED, CT head was negative for acute intracranial findings. MRI brain however did show acute lacunar type infarct extending from the right corona radiata through the right lentiform nuclei to the right external capsule. No mass effect or evidence of associated hemorrhage. 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 2 D ECHO showed torturous aorta with concern for aortic dissection. I spoke with neurology as well as cardiothoracic surgery and both recommend supportive care at this point. Patient is not a surgical candidate due to advanced age and comorbidities.   Assessment/Plan:   Principal Problem:  CVA - MRI brain showed acute lacunar type infarct extending from the right corona radiata through the right lentiform nuclei to the right external capsule. No mass effect or evidence of associated hemorrhage. 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 Per neurology will continue supportive care. - slurred speech resolved but she has ongoing LUE nad LLE weakness; consult placed for inpatient rehab - 2 D ECHO showed normal EF, torturous aorta. Per CTS pt is not surgical candidate due to advanced age and co-morbidities. Active Problems:  Hypertension  - continue Norvasc 10 mg daily  Dyslipidemia  - continue Lipitor 10 mg daily  DM2 (diabetes mellitus, type 2) with complications of neuropathy   - A1c - 10 on this admission indicating poor glycemic control  - current insulin regimen (decreased the dose from her usual home dose): insulin 25 units in am and 15 units at supper; continue glipizide  - continue SSI Diabetic neuropathy  - continue gabapentin  Peripheral vascular disease, unspecified  - had recent angioplasty  Dementia, acute encephalopathy  - continue aricept  Gout  - continue allopurinol  CKD (chronic kidney disease), stage III  - baseline creatinine 1.2 in 2014  - this admission, creatinine 1.5 and trending up to 1.7  - follow up BMP in am  Code Status: full code  Family Communication: no family at the bedside  Disposition Plan: follow up recommendations from PT evaluation   Consultants:  Neurology  Procedures:  None  Antibiotics:  None   Manson Passey, MD  [facility] Pager [phone]  If 7PM-7AM, please contact night-coverage [email or web address] Password [facility] 06/01/2013, 8:32 AM   LOS: 2 days   HPI/Subjective: No events overnight.  Objective: Filed Vitals:   05/31/13 2000 06/01/13 0000 06/01/13 0400 06/01/13 0800  BP: 115/75 127/59 165/80 150/75  Pulse: 80 70 73 71  Temp: 98.7 F (37.1 C) 98.3 F (36.8 C) 98.3 F (36.8 C) 97.9 F (36.6 C)  TempSrc: Oral Oral Oral Oral  Resp: 18 18 18 17   Height:      Weight:      SpO2: 97% 95% 96% 96%    Intake/Output Summary (Last 24 hours) at 06/01/13 0832 Last data filed at 05/31/13 1653  Gross per 24 hour  Intake    720 ml  Output    802 ml  Net    -  82 ml    Exam:   General:  Pt is sleeping this am  Cardiovascular: Regular rate and rhythm, S1/S2 appreciated  Respiratory: Clear to auscultation bilaterally, no wheezing  Abdomen: Soft, non tender, non distended, bowel sounds present, no guarding  Extremities: No edema, pulses DP and PT palpable bilaterally  Neuro: left upper and lower extremity weakness  Data Reviewed: Basic Metabolic Panel:  Recent Labs Lab 05/26/13 0611  05/30/13 0014 05/30/13 0026  NA 140 138 139  K 4.0 4.7 4.6  CL 100 95* 99  CO2  --  29  --   GLUCOSE 304* 276* 272*  BUN 29* 37* 37*  CREATININE 1.30* 1.59* 1.70*  CALCIUM  --  9.9  --    Liver Function Tests:  Recent Labs Lab 05/30/13 0014  AST 15  ALT 12  ALKPHOS 102  BILITOT <0.2*  PROT 7.7  ALBUMIN 3.1*   CBC:  Recent Labs Lab 05/26/13 0611 05/30/13 0014 05/30/13 0026  WBC  --  7.9  --   NEUTROABS  --  4.4  --   HGB 14.6 12.9 13.9  HCT 43.0 38.7 41.0  MCV  --  88.2  --   PLT  --  226  --    CBG:  Recent Labs Lab 05/31/13 1711 05/31/13 2029 05/31/13 2351 06/01/13 0511 06/01/13 0744  GLUCAP 193* 231* 196* 168* 161*   Studies: Mr Maxine GlennMra Head/brain Wo Cm   05/31/2013   1. Acute lacunar type infarct extending from the right corona radiata through the right lentiform nuclei to the right external capsule. No mass effect. 2. Intracranial atherosclerosis with no major circle of Willis branch occlusion.  3. 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  4. Chronic small vessel ischemia.    Scheduled Meds: . allopurinol  100 mg Oral BID  . amLODipine  10 mg Oral Daily  . atorvastatin  10 mg Oral q1800  . donepezil  10 mg Oral QHS  . enoxaparin  injection  30 mg Subcutaneous Q24H  . gabapentin  300 mg Oral TID  . glipiZIDE  5 mg Oral BID AC  . insulin aspart  0-9 Units Subcutaneous 6 times per day  . insulin aspart aspart  15 Units Subcutaneous Q supper  . insulin aspart aspart  25 Units Subcutaneous Q breakfast  . loratadine  10 mg Oral Daily

## 2013-06-01 NOTE — Evaluation (Signed)
Occupational Therapy Evaluation Patient Details Name: Prentice Dockerloiers Ryther MRN: [id number] DOB: April 05, 1917 Today's Date: 06/01/2013    History of Present Illness Pt admitted with left-sided weakness.  CVA suspected.  CT was negative.  MRI  Acute lacunar type infarct extending from the right corona   Clinical Impression   Pt admitted with above. She demonstrates the below listed deficits and will benefit from continued OT to maximize safety and independence with BADLs.  Pt presents to OT with lt. Hemiplegia.  She requires mod - max A with BADLs.  Recommend CIR.      Follow Up Recommendations  CIR;Supervision/Assistance - 24 hour    Equipment Recommendations  None recommended by OT    Recommendations for Other Services       Precautions / Restrictions Precautions Precautions: Fall      Mobility Bed Mobility Overal bed mobility: Needs Assistance Bed Mobility: Supine to Sit     Supine to sit: Mod assist     General bed mobility comments: assist to scoot hips to EOB and lift trunk  Transfers Overall transfer level: Needs assistance   Transfers: Sit to/from Stand;Stand Pivot Transfers;Squat Pivot Transfers Sit to Stand: Mod assist Stand pivot transfers: Mod assist Squat pivot transfers: Mod assist     General transfer comment: pt requires cues for technique.  Pt requires assist to block Lt knee to prevent buckling and assit to pivot.  Facilitation at Lt hip and rib cage    Balance Overall balance assessment: Needs assistance Sitting-balance support: Feet supported;No upper extremity supported Sitting balance-Leahy Scale: Fair     Standing balance support: Single extremity supported Standing balance-Leahy Scale: Poor                      ADL Eating/Feeding: Set up;Sitting Grooming: Wash/dry face;Wash/dry hands;Oral care;Brushing hair;Minimal assistance;Sitting   Upper Body Dressing : Maximal assistance;Sitting Lower Body Bathing: Maximal assistance;Sit  to/from stand Lower Body Dressing: Total assistance;Sit to/from stand Toilet Transfer: Moderate assistance;Squat-pivot;Stand-pivot;BSC Toileting- Clothing Manipulation and Hygiene: Total assistance;Sit to/from stand   Functional mobility during ADLs: Moderate assistance (squat and stand pivot transfers) General ADL Comments: Pt loses balance to Lt. when challenged sitting EOB.     Vision Eye Alignment: Within Functional Limits Alignment/Gaze Preference: Within Defined Limits Ocular Range of Motion: Within Functional Limits Tracking/Visual Pursuits: Able to track stimulus in all quads without difficulty             Perception Perception Perception Tested?: Yes   Praxis Praxis Praxis tested?: Within functional limits    Pertinent Vitals/Pain      Hand Dominance Right   Extremity/Trunk Assessment Upper Extremity Assessment Upper Extremity Assessment: LUE deficits/detail LUE Deficits / Details: flaccid.  Questionable mild elevation of shoulder when pt attempts to lift Lt. UE LUE Coordination: decreased fine motor;decreased gross motor   Lower Extremity Assessment Lower Extremity Assessment: Defer to PT evaluation   Cervical / Trunk Assessment Cervical / Trunk Assessment: Normal   Communication Communication Communication: No difficulties   Cognition Arousal/Alertness: Awake/alert Behavior During Therapy: WFL for tasks assessed/performed Overall Cognitive Status: Within Functional Limits for tasks assessed                     General Comments       Exercises      Home Living Family/patient expects to be discharged to:: Private residence Living Arrangements: Alone Available Help at Discharge: Family;Available PRN/intermittently Type of Home: Apartment Home Access: Level entry  Home Layout: One level     Bathroom Shower/Tub: Tub/shower unit;Curtain Shower/tub characteristics: Engineer, building services: Standard Bathroom Accessibility: Yes How  Accessible: Accessible via walker Home Equipment: Walker - 2 wheels;Tub bench      Lives With: Alone    Prior Functioning/Environment Level of Independence: Independent with assistive device(s)        Comments: Pt reports she does not drive.  She has a neighbor who assists her once a day and supervises her with showering    OT Diagnosis:     OT Problem List: Decreased strength;Decreased range of motion;Decreased activity tolerance;Impaired balance (sitting and/or standing);Decreased coordination;Decreased safety awareness;Decreased knowledge of use of DME or AE;Impaired tone;Impaired UE functional use   OT Treatment/Interventions: Self-care/ADL training;Neuromuscular education;DME and/or AE instruction;Therapeutic activities;Visual/perceptual remediation/compensation;Splinting;Patient/family education;Balance training    OT Goals(Current goals can be found in the care plan section) Acute Rehab OT Goals Patient Stated Goal: home OT Goal Formulation: With patient Time For Goal Achievement: 06/15/13 Potential to Achieve Goals: Good ADL Goals Pt Will Perform Grooming: with supervision;sitting;with set-up Pt Will Perform Upper Body Bathing: with set-up;with supervision;sitting Pt Will Perform Lower Body Bathing: with min assist;sit to/from stand Pt Will Transfer to Toilet: with min assist;stand pivot transfer;bedside commode Pt Will Perform Toileting - Clothing Manipulation and hygiene: with min assist;sit to/from stand Additional ADL Goal #1: Pt will use Lt. UE as a support during BADLs  OT Frequency: Min 3X/week   Barriers to D/C: Decreased caregiver support  pt is unsure level of support she will have at discharge       End of Session:    Activity Tolerance: Patient tolerated treatment well Patient left: in chair;with call bell/phone within reach;with chair alarm set   Time: 1125-1148 OT Time Calculation (min): 23 min Charges:  OT General Charges $OT Visit: 1  Procedure OT Evaluation $Initial OT Evaluation Tier I: 1 Procedure OT Treatments $Self Care/Home Management : 8-22 mins G-Codes:    Karron Goens M 06/08/13, 12:03 PM

## 2013-06-01 NOTE — Progress Notes (Signed)
UR Completed Harrold Fitchett Graves-Bigelow, RN,BSN [phone]  

## 2013-06-01 NOTE — Progress Notes (Signed)
Rehab Admissions Coordinator Note:  Patient was screened by Trish MageLogue, Zaliah Wissner M for appropriateness for an Inpatient Acute Rehab Consult.  Noted PT recommending CIR.  At this time, we are recommending Inpatient Rehab consult.  Trish MageLogue, Kemoni Quesenberry M 06/01/2013, 8:53 AM  I can be reached at [phone].

## 2013-06-01 NOTE — Progress Notes (Signed)
UR Completed Hazely Sealey Graves-Bigelow, RN,BSN [phone]  

## 2013-06-01 NOTE — Care Management Note (Unsigned)
    Page 1 of 1   06/01/2013     12:41:36 PM   CARE MANAGEMENT NOTE 06/01/2013  Patient:  [name],[name]   Account Number:  [id number]  Date Initiated:  06/01/2013  Documentation initiated by:  GRAVES-BIGELOW,Karolyn Messing  Subjective/Objective Assessment:   Pt admitted for Slurred Speech and left Arm And Leg Weakness. MRI revealed Acute lacunar type infarct extending from the right corona radiata through the right lentiform nuclei to the right external capsule.     Action/Plan:   CIR has been recommended for pt. Consult has ben placed. CM will continue to monitor for disposition needs.   Anticipated DC Date:  06/03/2013   Anticipated DC Plan:  IP REHAB FACILITY      DC Planning Services  CM consult      Choice offered to / List presented to:             Status of service:  In process, will continue to follow Medicare Important Message given?   (If response is "NO", the following Medicare IM given date fields will be blank) Date Medicare IM given:   Date Additional Medicare IM given:    Discharge Disposition:    Per UR Regulation:  Reviewed for med. necessity/level of care/duration of stay  If discussed at Long Length of Stay Meetings, dates discussed:    Comments:

## 2013-06-01 NOTE — Progress Notes (Signed)
I spoke with pt and then contacted her daughter, by phone. We discussed inpt rehab venue vs SNF. Pt lives alone with an aide 2 hrs per day. Daughter states pt needs to remain at rehab until she recovers to return home at level where she can live alone as she did pta. She is in agreement to SNF. We will sign [phone]

## 2013-06-02 LAB — BASIC METABOLIC PANEL
BUN: 20 mg/dL (ref 6–23)
CALCIUM: 9.1 mg/dL (ref 8.4–10.5)
CHLORIDE: 101 meq/L (ref 96–112)
CO2: 23 mEq/L (ref 19–32)
CREATININE: 0.9 mg/dL (ref 0.50–1.10)
GFR, EST AFRICAN AMERICAN: 62 mL/min — AB (ref >90)
GFR, EST NON AFRICAN AMERICAN: 53 mL/min — AB (ref >90)
Glucose, Bld: 212 mg/dL — ABNORMAL HIGH (ref 70–99)
Potassium: 3.8 mEq/L (ref 3.7–5.3)
Sodium: 141 mEq/L (ref 137–147)

## 2013-06-02 LAB — GLUCOSE, CAPILLARY
GLUCOSE-CAPILLARY: 113 mg/dL — AB (ref 70–99)
Glucose-Capillary: 175 mg/dL — ABNORMAL HIGH (ref 70–99)
Glucose-Capillary: 229 mg/dL — ABNORMAL HIGH (ref 70–99)

## 2013-06-02 MED ORDER — INSULIN ASPART 100 UNIT/ML ~~LOC~~ SOLN: 0.0000 [IU] | SUBCUTANEOUS | Status: DC

## 2013-06-02 MED ORDER — HYDROCODONE-ACETAMINOPHEN 5-325 MG PO TABS: 1.0000 | ORAL_TABLET | Freq: Four times a day (QID) | ORAL | Status: DC | PRN

## 2013-06-02 MED ORDER — SENNOSIDES-DOCUSATE SODIUM 8.6-50 MG PO TABS: 1.0000 | ORAL_TABLET | Freq: Every evening | ORAL | Status: DC | PRN

## 2013-06-02 NOTE — Progress Notes (Signed)
Speech Language Pathology Treatment: Dysphagia  Patient Details Name: [name] MRN: [id number] DOB: [birth year] Today's Date: 06/02/2013 Time: 8182-9937 SLP Time Calculation (min): 15 min  Assessment / Plan / Recommendation Clinical Impression  Pt with improved swallow function, attention to left and reduced pocketing with mod I cues required.  Tolerated upgraded solid trials.  Recommend mechanical soft diet, thin liquids, meds whole with liquid.  No SLP f/u required at D/C.   HPI HPI: 80 year old female admitted 05/30/13 due to increased falls at home, slurred speech and left sided weakness.  PMH significant for GERD.        SLP Plan  All goals met    Recommendations Diet recommendations: Dysphagia 3 (mechanical soft);Thin liquid Liquids provided via: Cup;Straw Medication Administration: Whole meds with liquid Supervision: Patient able to self feed Compensations: Check for pocketing Postural Changes and/or Swallow Maneuvers: Seated upright 90 degrees              Oral Care Recommendations: Oral care BID Follow up Recommendations: None Plan: All goals met   [name] L. Tivis Ringer, [place] CCC/SLP Pager [phone]      [name] 06/02/2013, 10:10 AM

## 2013-06-02 NOTE — Progress Notes (Signed)
Inpatient Diabetes Program Recommendations  AACE/ADA: New Consensus Statement on Inpatient Glycemic Control (2013)  Target Ranges:  Prepandial:   less than 140 mg/dL      Peak postprandial:   less than 180 mg/dL (1-2 hours)      Critically ill patients:  140 - 180 mg/dL     Results for [name], [name] (MRN [id number]) as of 06/02/2013 07:23  Ref. Range 05/31/2013 23:51 06/01/2013 05:11 06/01/2013 07:44 06/01/2013 11:31 06/01/2013 17:03 06/01/2013 20:18  Glucose-Capillary Latest Range: 70-99 mg/dL 409196 (H) 811168 (H) 914161 (H) 163 (H) 101 (H) 264 (H)    Results for [name], [name] (MRN [id number]) as of 06/02/2013 07:23  Ref. Range 06/01/2013 23:54 06/02/2013 04:03  Glucose-Capillary Latest Range: 70-99 mg/dL 086102 (H) 578113 (H)     **CBGs look stable on the following regimen: 70/30 insulin- 25 units with breakfast/ 15 units with supper Novolog Sensitive SSI Q4 hours Glipizide 5 mg bidwc   **Patient is eating 50-100% of meals   MD- Please change CBG checks and Novolog SSI coverage to tid ac + HS (currently ordered Q4 hours)   Will follow. Ambrose FinlandJeannine Johnston Wong Steadham RN, MSN, CDE Diabetes Coordinator Inpatient Diabetes Program Team Pager: [phone] (8a-10p)

## 2013-06-02 NOTE — Discharge Instructions (Signed)
Ischemic Stroke A stroke (cerebrovascular accident) is the sudden death of brain tissue. It is a medical emergency. A stroke can cause permanent loss of brain function. This can cause problems with different parts of your body. A transient ischemic attack (TIA) is different because it does not cause permanent damage. A TIA is a short-lived problem of poor blood flow affecting a part of the brain. A TIA is also a serious problem because having a TIA greatly increases the chances of having a stroke. When symptoms first develop, you cannot know if the problem might be a stroke or TIA. CAUSES  A stroke is caused by a decrease of oxygen supply to an area of your brain. It is usually the result of a small blood clot or collection of cholesterol or fat (plaque) that blocks blood flow in the brain. A stroke can also be caused by blocked or damaged carotid arteries.  RISK FACTORS  High blood pressure (hypertension).  High cholesterol.  Diabetes mellitus.  Heart disease.  The build up of plaque in the blood vessels (peripheral artery disease or atherosclerosis).  The build up of plaque in the blood vessels providing blood and oxygen to the brain (carotid artery stenosis).  An abnormal heart rhythm (atrial fibrillation).  Obesity.  Smoking.  Taking oral contraceptives (especially in combination with smoking).  Physical inactivity.  A diet high in fats, salt (sodium), and calories.  Alcohol use.  Use of illegal drugs (especially cocaine and methamphetamine).  Being African American.  Being over the age of 55.  Family history of stroke.  Previous history of blood clots, stroke, TIA, or heart attack.  Sickle cell disease. SYMPTOMS  These symptoms usually develop suddenly, or may be newly present upon awakening from sleep:  Sudden weakness or numbness of the face, arm, or leg, especially on one side of the body.  Sudden trouble walking or difficulty moving arms or legs.  Sudden  confusion.  Sudden personality changes.  Trouble speaking (aphasia) or understanding.  Difficulty swallowing.  Sudden trouble seeing in one or both eyes.  Double vision.  Dizziness.  Loss of balance or coordination.  Sudden severe headache with no known cause.  Trouble reading or writing. DIAGNOSIS  Your caregiver can often determine the presence or absence of a stroke based on your symptoms, history, and physical exam. Computed tomography (CT) of the brain is usually performed to confirm the stroke, determine causes, and determine stroke severity. Other tests may be done to find the cause of the stroke. These tests may include:  Electrocardiography.  Continuous heart monitoring.  Echocardiography.  Carotid ultrasonography.  Magnetic resonance imaging (MRI).  A scan of the brain circulation.  Blood tests. PREVENTION  The risk of a stroke can be decreased by appropriately treating high blood pressure, high cholesterol, diabetes, heart disease, and obesity and by quitting smoking, limiting alcohol, and staying physically active. TREATMENT  Time is of the essence. It is important to seek treatment within 3 4 hours of the start of symptoms because you may receive a medicine to dissolve the clot (thrombolytic) that cannot be given after that time. Even if you do not know when your symptoms began, get treatment as soon as possible. After the 4 hour window has passed, treatment may include rest, oxygen, intravenous (IV) fluids, and medicines to thin the blood (anticoagulants). Treatment of stroke depends on the duration, severity, and cause of your symptoms. Medicines and diet may be used to address diabetes, high blood pressure, and other risk   factors. Physical, speech, and occupational therapists will assess you and work to improve any functions impaired by the stroke. Measures will be taken to prevent short-term and long-term complications, including infection from breathing  foreign material into the lungs (aspiration pneumonia), blood clots in the legs, bedsores, and falls. Rarely, surgery may be needed to remove large blood clots or to open up blocked arteries. HOME CARE INSTRUCTIONS   Take all medicines prescribed by your caregiver. Follow the directions carefully. Medicines may be used to control risk factors for a stroke. Be sure you understand all your medicine instructions.  You may be told to take aspirin or the anticoagulant warfarin. Warfarin needs to be taken exactly as instructed.  Too much and too little warfarin are both dangerous. Too much warfarin increases the risk of bleeding. Too little warfarin continues to allow the risk for blood clots. While taking warfarin, you will need to have regular blood tests to measure your blood clotting time. These blood tests usually include both the PT and INR tests. The PT and INR results allow your caregiver to adjust your dose of warfarin. The dose can change for many reasons. It is critically important that you take warfarin exactly as prescribed, and that you have your PT and INR levels drawn exactly as directed.  Many foods, especially foods high in vitamin K can interfere with warfarin and affect the PT and INR results. Foods high in vitamin K include spinach, kale, broccoli, cabbage, collard and turnip greens, brussels sprouts, peas, cauliflower, seaweed, and parsley as well as beef and pork liver, green tea, and soybean oil. You should eat a consistent amount of foods high in vitamin K. Avoid major changes in your diet, or notify your caregiver before changing your diet. Arrange a visit with a dietitian to answer your questions.  Many medicines can interfere with warfarin and affect the PT and INR results. You must tell your caregiver about any and all medicines you take, this includes all vitamins and supplements. Be especially cautious with aspirin and anti-inflammatory medicines. Do not take or discontinue any  prescribed or over-the-counter medicine except on the advice of your caregiver or pharmacist.  Warfarin can have side effects, such as excessive bruising or bleeding. You will need to hold pressure over cuts for longer than usual. Your caregiver or pharmacist will discuss other potential side effects.  Avoid sports or activities that may cause injury or bleeding.  Be mindful when shaving, flossing your teeth, or handling sharp objects.  Alcohol can change the body's ability to handle warfarin. It is best to avoid alcoholic drinks or consume only very small amounts while taking warfarin. Notify your caregiver if you change your alcohol intake.  Notify your dentist or other caregivers before procedures.  If swallow studies have determined that your swallowing reflex is present, you should eat healthy foods. A diet that includes 5 or more servings of fruits and vegetables a day may reduce the risk of stroke. Foods may need to be a special consistency (soft or pureed), or small bites may need to be taken in order to avoid aspirating or choking. Certain diets may be prescribed to address high blood pressure, high cholesterol, diabetes, or obesity.  A low-sodium, low-saturated fat, low-trans fat, low-cholesterol diet is recommended to manage high blood pressure.  A low-saturated fat, low-trans fat, low-cholesterol, and high-fiber diet may control cholesterol levels.  A controlled-carbohydrate, controlled-sugar diet is recommended to manage diabetes.  A reduced-calorie, low-sodium, low-saturated fat, low-trans fat, low-cholesterol diet   is recommended to manage obesity.  Maintain a healthy weight.  Stay physically active. It is recommended that you get at least 30 minutes of activity on most or all days.  Do not smoke.  Limit alcohol use even if you are not taking warfarin. Moderate alcohol use is considered to be:  No more than 2 drinks each day for men.  No more than 1 drink each day for  nonpregnant women.  Stop drug abuse.  Home safety. A safe home environment is important to reduce the risk of falls. Your caregiver may arrange for specialists to evaluate your home. Having grab bars in the bedroom and bathroom is often important. Your caregiver may arrange for equipment to be used at home, such as raised toilets and a seat for the shower.  Physical, occupational, and speech therapy. Ongoing therapy may be needed to maximize your recovery after a stroke. If you have been advised to use a walker or a cane, use it at all times. Be sure to keep your therapy appointments.  Follow all instructions for follow-up with your caregiver. This is very important. This includes any referrals, physical therapy, rehabilitation, and lab tests. Proper follow up can prevent another stroke from occurring. SEEK MEDICAL CARE IF:  You have personality changes.  You have difficulty swallowing.  You are seeing double.  You have dizziness.  You have a fever.  You have skin breakdown. SEEK IMMEDIATE MEDICAL CARE IF:  Any of these symptoms may represent a serious problem that is an emergency. Do not wait to see if the symptoms will go away. Get medical help right away. Call your local emergency services (911 in [place] Do not drive yourself to the hospital.  You have sudden weakness or numbness of the face, arm, or leg, especially on one side of the body.  You have sudden trouble walking or difficulty moving arms or legs.  You have sudden confusion.  You have trouble speaking (aphasia) or understanding.  You have sudden trouble seeing in one or both eyes.  You have a loss of balance or coordination.  You have a sudden, severe headache with no known cause.  You have new chest pain or an irregular heartbeat.  You have a partial or total loss of consciousness.   Document Released: 02/19/2005 Document Revised: 10/22/2012 Document Reviewed: 09/30/2011 [employer] Patient Information 2014  [employer]  

## 2013-06-02 NOTE — Discharge Summary (Signed)
Physician Discharge Summary  [name] WUJ:[id number] DOB: [birth year] DOA: 05/30/2013  PCP: [name], MD  Admit date: 05/30/2013 Discharge date: 06/02/2013  Recommendations for Outpatient Follow-up:  1. Please check BMP to ensure Cr remains stable and at baseline 1.3 - 1.7  2. Needs A1C check in 3 months and close follow up on diabetic control   Discharge Diagnoses: Left sided weakness secondary to CVA Principal Problem:   Left-sided weakness Active Problems:   Hypertension   High cholesterol   DM2 (diabetes mellitus, type 2)   Diabetic neuropathy   Peripheral vascular disease, unspecified   CVA (cerebral infarction)   CKD (chronic kidney disease), stage III  Discharge Condition: stable   Diet recommendation: as pt able to tolerate   History of present illness:  80 year old female with past medical history of hypertension, diabetes, dementia, status post angioplasty of the left peroneal artery due to PVD ( *0% Stenosis) by Dr. Myra Gianotti on 05/27/2013 who presented to [facility] ED 05/30/2013 from home with reports of slurred speech and left side weakness. Slurred speech has improved but weakness is still present. In addition, patient had 2 falls at home prior to this admission.In ED, CT head was negative for acute intracranial findings. MRI brain however did show acute lacunar type infarct extending from the right corona radiata through the right lentiform nuclei to the right external capsule. No mass effect or evidence of associated hemorrhage. 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 2 D ECHO showed torturous aorta with concern for aortic dissection. I spoke with neurology as well as cardiothoracic surgery and both recommend supportive care at this point. Patient is not a surgical candidate due to advanced age and comorbidities.   Assessment/Plan:  Principal Problem:  CVA  - MRI brain showed acute lacunar type infarct extending from the right  corona radiata through the right lentiform nuclei to the right external capsule. No mass effect or evidence of associated hemorrhage. 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 Per neurology will continue supportive care.  - slurred speech resolved but she has ongoing LUE nad LLE weakness; consult placed for inpatient rehab  - 2 D ECHO showed normal EF, torturous aorta. Per CTS pt is not surgical candidate due to advanced age and co-morbidities.  Active Problems:  Hypertension  - continue Norvasc 10 mg daily  Dyslipidemia  - continue Lipitor 10 mg daily  DM2 (diabetes mellitus, type 2) with complications of neuropathy  - A1c - 10 on this admission indicating poor glycemic control  - continue insulin per home medical regimen  Diabetic neuropathy  - continue gabapentin  Peripheral vascular disease, unspecified  - had recent angioplasty  Dementia, acute encephalopathy  - continue aricept  Gout  - continue allopurinol  CKD (chronic kidney disease), stage III  - baseline creatinine 1.2 in 2014  - this admission, creatinine 1.5 and trending up to 1.7   Code Status: full code  Family Communication: no family at the bedside   Consultants:  Neurology Inpatient rehabilitation team  Procedures:  None  Antibiotics:  None  Signed:  Manson Passey, MD  [facility] 06/02/2013, 9:12 AM  Pager #: [phone]  Discharge Exam: Filed Vitals:   06/02/13 0815  BP: 163/75  Pulse: 68  Temp: 97.4 F (36.3 C)  Resp: 18   Filed Vitals:   06/01/13 2022 06/01/13 2357 06/02/13 0433 06/02/13 0815  BP: 144/76 164/85 149/79  163/75  Pulse: 71 66 65 68  Temp: 98 F (36.7 C) 98.2 F (36.8 C) 98.3 F (36.8 C) 97.4 F (36.3 C)  TempSrc: Oral Oral Oral Oral  Resp: 18 18 18 18   Height:      Weight:      SpO2: 100% 100% 97% 97%    General: Pt is alert, follows commands appropriately, not in acute distress Cardiovascular: Regular rate and rhythm,  no  rubs, no gallops Respiratory: Clear to auscultation bilaterally, no wheezing, no crackles, no rhonchi Abdominal: Soft, non tender, non distended, bowel sounds +, no guarding  Discharge Instructions   Future Appointments Provider Department Dept Phone   07/06/2013 9:00 AM [facility] [place] [facility] [address] [phone]   07/06/2013 9:30 AM [facility] [place] [facility] [address] [phone]   07/06/2013 10:00 AM Nada Libman, MD [facility] [phone]       Medication List         allopurinol 100 MG tablet  Commonly known as:  ZYLOPRIM  Take 100 mg by mouth 2 (two) times daily.     amLODipine 10 MG tablet  Commonly known as:  NORVASC  Take 1 tablet (10 mg total) by mouth daily.     aspirin 81 MG EC tablet  Take 1 tablet (81 mg total) by mouth daily.     BENEFIBER DRINK MIX Pack  Take 1 packet by mouth daily as needed (stool).     BRINTELLIX 10 MG Tabs  Generic drug:  Vortioxetine HBr  Take 10 mg by mouth daily.     cadexomer iodine 0.9 % gel  Commonly known as:  IODOSORB  Apply 1 application topically daily as needed for wound care (to big toe).     cetirizine 10 MG tablet  Commonly known as:  ZYRTEC  Take 10 mg by mouth daily.     donepezil 10 MG tablet  Commonly known as:  ARICEPT  Take 10 mg by mouth at bedtime.     gabapentin 300 MG capsule  Commonly known as:  NEURONTIN  Take 300 mg by mouth 3 (three) times daily.     glipiZIDE 5 MG tablet  Commonly known as:  GLUCOTROL  Take 5 mg by mouth 2 (two) times daily before a meal.     HUMALOG MIX 75/25 KWIKPEN (75-25) 100 UNIT/ML Kwikpen  Generic drug:  Insulin Lispro Prot & Lispro  Inject 20-30 Units into the skin 2 (two) times daily. Use 30 units every morning and 20 units every evening     HYDROcodone-acetaminophen 5-325 MG per tablet  Commonly known as:  NORCO/VICODIN  Take 1 tablet by mouth every 6 (six) hours as needed for moderate pain.      insulin aspart 100 UNIT/ML injection  Commonly known as:  novoLOG  Inject 0-9 Units into the skin every 4 (four) hours.     phenylephrine-shark liver oil-mineral oil-petrolatum 0.25-3-14-71.9 % rectal ointment  Commonly known as:  PREPARATION H  Place 1 application rectally 2 (two) times daily as needed for hemorrhoids.     rosuvastatin 5 MG tablet  Commonly known as:  CRESTOR  Take 5 mg by mouth daily.     senna-docusate 8.6-50 MG per tablet  Commonly known as:  Senokot-S  Take 1 tablet by mouth at bedtime as needed for moderate constipation.           Follow-up Information   Follow up with [name], MD.   Specialty:  Internal Medicine   Contact information:   [address] [place] [address] [phone]        The results of significant diagnostics from this hospitalization (including imaging, microbiology, ancillary and laboratory) are listed below for reference.    Significant Diagnostic Studies: Dg Chest 2 View  05/30/2013   CLINICAL DATA:  Stroke.  EXAM: CHEST  2 VIEW  COMPARISON:  DG CHEST 1V PORT dated 05/08/2013; CT ABD/PELVIS W CM dated 11/16/2011  FINDINGS: Stable enlarged cardiac and mediastinal contours. Persistent marked enlargement and tortuosity of the thoracic aorta. Unchanged probable calcified mediastinal lymph node and calcified pulmonary granulomas. No large consolidative pulmonary opacities. No definite pleural effusion or pneumothorax. Bilateral glenohumeral joint degenerative change.  IMPRESSION: Cardiomegaly.  Marked enlargement and tortuosity of the thoracic aorta.  Unchanged calcified granulomas and mediastinal lymph nodes.   Electronically Signed   By: Annia Beltrew  Davis M.D.   On: 05/30/2013 09:43   Ct Head (brain) Wo Contrast  05/30/2013   CLINICAL DATA:  Slurred speech, left arm/leg weakness  EXAM: CT HEAD WITHOUT CONTRAST  TECHNIQUE: Contiguous axial images were obtained from the base of the skull through the vertex  without intravenous contrast.  COMPARISON:  05/20/2012  FINDINGS: No evidence of parenchymal hemorrhage or extra-axial fluid collection. No mass lesion, mass effect, or midline shift.  No CT evidence of acute infarction.  Subcortical white matter and periventricular small vessel ischemic changes. Intracranial atherosclerosis.  Global cortical atrophy, likely age appropriate. No ventriculomegaly.  Partial opacification/mucosal thickening in the bilateral maxillary sinuses. Mastoid air cells are clear.  No evidence of calvarial fracture.  IMPRESSION: No evidence of acute intracranial abnormality.  Age related atrophy, small-vessel ischemic changes, and intracranial atherosclerosis.   Electronically Signed   By: Charline BillsSriyesh  Krishnan M.D.   On: 05/30/2013 00:50   Mr Brain Wo Contrast  05/31/2013   CLINICAL DATA:  80 year old female with slurred speech and left side weakness. Stroke. Initial encounter.  EXAM: MRI HEAD WITHOUT CONTRAST  MRA HEAD WITHOUT CONTRAST  TECHNIQUE: Multiplanar, multiecho pulse sequences of the brain and surrounding structures were obtained without intravenous contrast. Angiographic images of the head were obtained using MRA technique without contrast.  COMPARISON:  Head CT without contrast 05/30/2013. Brain MRI 11/22/2011.  FINDINGS: MRI HEAD FINDINGS  The examination had to be discontinued prior to completion. Also, the study is intermittently degraded by motion artifact despite repeated imaging attempts. .  Cerebral volume is not significantly changed since 2013. Major intracranial vascular flow voids are stable.  There is a curvilinear area of restricted diffusion tracking from the posterior right corona radiata through the lentiform nuclei to the right posterior limb external capsule (series 6, image 16). T2 hyperintensity without mass effect or evidence of associated hemorrhage.  No contralateral or posterior fossa restricted diffusion.  Chronic cerebellar and brainstem lacunar infarcts.  Patchy and confluent cerebral white matter and deep gray matter T2 hyperintensity. No midline shift, mass effect, or evidence of intracranial mass lesion. No ventriculomegaly. Stable basilar cisterns. Stable pituitary, cervicomedullary junction and visualized cervical spine. No extra-axial collection. Stable orbits soft tissues. Chronic maxillary sinus inflammatory changes. Other paranasal sinuses and mastoids remain clear. Bone marrow signal within normal limits.  MRA HEAD FINDINGS  Antegrade flow in the posterior circulation with codominant distal vertebral arteries. Tortuous distal vertebral arteries with dolichoectatic vertebrobasilar junction. Small 3 mm aneurysm at the left AICA origin (series 5, image 58, series 507, image 6).  Moderate to severe basilar artery irregularity but  no hemodynamically significant basilar artery stenosis. There is a 7 mm cephalad directed basilar tip aneurysm (series 5, image 91, series 506, image 4). Moderate to severe irregularity and stenosis at both PCA origins. Nonetheless, distal PCA flow is preserved. Normal right posterior communicating artery, the left is diminutive.  Antegrade flow in both ICA siphons. Tortuous cavernous segments. Moderate to severely irregular supra clinoid segments with mild stenosis. Ophthalmic artery origins are within normal limits. Carotid termini are patent. MCA and ACA origins are patent.  Tortuous proximal ACA branches. Bilateral MCA M1 segments are patent with atherosclerotic irregularity left greater than right. Both MCA bifurcations are patent. Motion artifact degrades detail of the bilateral MCA and ACA branches. No major MCA branch occlusion is evident.  IMPRESSION: 1. Acute lacunar type infarct extending from the right corona radiata through the right lentiform nuclei to the right external capsule. No mass effect or evidence of associated hemorrhage. 2. Intracranial atherosclerosis with no major circle of Willis branch occlusion. 3. Seven  mm basilar artery aneurysm. 3 mm aneurysm at the left AICA origin. 4. Chronic small vessel ischemia.   Electronically Signed   By: Augusto Gamble M.D.   On: 05/31/2013 20:47   Dg Chest Port 1 View  05/08/2013   CLINICAL DATA:  Chest pain  EXAM: PORTABLE CHEST - 1 VIEW  COMPARISON:  05/20/2012  FINDINGS: Stable chronic cardiopericardial enlargement. Unchanged prominence of the thoracic aorta, likely from tortuosity rather than ectasia. No evidence of mediastinal hematoma. Calcified left mediastinal lymph nodes. Calcified granuloma at the right base, located in the liver. No edema, consolidation, effusion, or pneumothorax.  IMPRESSION: Stable appearance of the chest.  No evidence of acute disease.   Electronically Signed   By: Tiburcio Pea M.D.   On: 05/08/2013 04:17   Dg Foot Complete Left  05/05/2013   CLINICAL DATA:  80 year old female with soft tissue wound medial tip left great toe. Pain and infection. Diabetic. Initial encounter.  EXAM: LEFT FOOT - COMPLETE 3+ VIEW  COMPARISON:  None.  FINDINGS: There is a retained metal foreign body situated between the first and second metatarsals, approximately 14 mm in length.  Calcified atherosclerosis in the left foot an ankle. No subcutaneous gas.  Bone mineralization is within normal limits for age. The phalanges of the left great toe appear intact. No cortical osteolysis. Joint spaces are within normal limits for age.  Calcaneus intact.  No acute fracture or dislocation.  IMPRESSION: 1. No acute osseous abnormality in the left foot. No plain radiographic evidence of osteomyelitis. 2. Retained linear metal foreign body between the first and second metatarsals.   Electronically Signed   By: Augusto Gamble M.D.   On: 05/05/2013 14:03   Mr Maxine Glenn Head/brain Wo Cm  05/31/2013   CLINICAL DATA:  80 year old female with slurred speech and left side weakness. Stroke. Initial encounter.  EXAM: MRI HEAD WITHOUT CONTRAST  MRA HEAD WITHOUT CONTRAST  TECHNIQUE: Multiplanar, multiecho  pulse sequences of the brain and surrounding structures were obtained without intravenous contrast. Angiographic images of the head were obtained using MRA technique without contrast.  COMPARISON:  Head CT without contrast 05/30/2013. Brain MRI 11/22/2011.  FINDINGS: MRI HEAD FINDINGS  The examination had to be discontinued prior to completion. Also, the study is intermittently degraded by motion artifact despite repeated imaging attempts. .  Cerebral volume is not significantly changed since 2013. Major intracranial vascular flow voids are stable.  There is a curvilinear area of restricted diffusion tracking from the posterior right  corona radiata through the lentiform nuclei to the right posterior limb external capsule (series 6, image 16). T2 hyperintensity without mass effect or evidence of associated hemorrhage.  No contralateral or posterior fossa restricted diffusion.  Chronic cerebellar and brainstem lacunar infarcts. Patchy and confluent cerebral white matter and deep gray matter T2 hyperintensity. No midline shift, mass effect, or evidence of intracranial mass lesion. No ventriculomegaly. Stable basilar cisterns. Stable pituitary, cervicomedullary junction and visualized cervical spine. No extra-axial collection. Stable orbits soft tissues. Chronic maxillary sinus inflammatory changes. Other paranasal sinuses and mastoids remain clear. Bone marrow signal within normal limits.  MRA HEAD FINDINGS  Antegrade flow in the posterior circulation with codominant distal vertebral arteries. Tortuous distal vertebral arteries with dolichoectatic vertebrobasilar junction. Small 3 mm aneurysm at the left AICA origin (series 5, image 58, series 507, image 6).  Moderate to severe basilar artery irregularity but no hemodynamically significant basilar artery stenosis. There is a 7 mm cephalad directed basilar tip aneurysm (series 5, image 91, series 506, image 4). Moderate to severe irregularity and stenosis at both PCA  origins. Nonetheless, distal PCA flow is preserved. Normal right posterior communicating artery, the left is diminutive.  Antegrade flow in both ICA siphons. Tortuous cavernous segments. Moderate to severely irregular supra clinoid segments with mild stenosis. Ophthalmic artery origins are within normal limits. Carotid termini are patent. MCA and ACA origins are patent.  Tortuous proximal ACA branches. Bilateral MCA M1 segments are patent with atherosclerotic irregularity left greater than right. Both MCA bifurcations are patent. Motion artifact degrades detail of the bilateral MCA and ACA branches. No major MCA branch occlusion is evident.  IMPRESSION: 1. Acute lacunar type infarct extending from the right corona radiata through the right lentiform nuclei to the right external capsule. No mass effect or evidence of associated hemorrhage. 2. Intracranial atherosclerosis with no major circle of Willis branch occlusion. 3. 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 4. Chronic small vessel ischemia.   Electronically Signed   By: Augusto Gamble M.D.   On: 05/31/2013 20:47    Microbiology: No results found for this or any previous visit (from the past 240 hour(s)).   Labs: Basic Metabolic Panel:  Recent Labs Lab 05/30/13 0014 05/30/13 0026  NA 138 139  K 4.7 4.6  CL 95* 99  CO2 29  --   GLUCOSE 276* 272*  BUN 37* 37*  CREATININE 1.59* 1.70*  CALCIUM 9.9  --    Liver Function Tests:  Recent Labs Lab 05/30/13 0014  AST 15  ALT 12  ALKPHOS 102  BILITOT <0.2*  PROT 7.7  ALBUMIN 3.1*   No results found for this basename: LIPASE, AMYLASE,  in the last 168 hours No results found for this basename: AMMONIA,  in the last 168 hours CBC:  Recent Labs Lab 05/30/13 0014 05/30/13 0026  WBC 7.9  --   NEUTROABS 4.4  --   HGB 12.9 13.9  HCT 38.7 41.0  MCV 88.2  --   PLT 226  --    CBG:  Recent Labs Lab 06/01/13 1703 06/01/13 2018 06/01/13 2354 06/02/13 0403  06/02/13 0813  GLUCAP 101* 264* 102* 113* 175*    Time coordinating discharge: Over 30 minutes

## 2013-06-03 ENCOUNTER — Other Ambulatory Visit: Payer: Self-pay | Admitting: *Deleted

## 2013-06-03 MED ORDER — HYDROCODONE-ACETAMINOPHEN 5-325 MG PO TABS: 1.0000 | ORAL_TABLET | Freq: Four times a day (QID) | ORAL | Status: DC | PRN

## 2013-06-03 NOTE — Progress Notes (Addendum)
Clinical social worker assisted with patient discharge to skilled nursing facility, [facility].  CSW addressed all family questions and concerns. CSW copied chart and added all important documents. CSW also set up patient transportation with [facility]. Clinical Social Worker will sign off for now as social work intervention is no longer needed.   Lavel Rieman, MSW, LCSWA [phone] 

## 2013-06-03 NOTE — Telephone Encounter (Signed)
[facility] 

## 2013-06-04 ENCOUNTER — Non-Acute Institutional Stay (SKILLED_NURSING_FACILITY): Payer: Medicare Other | Admitting: Internal Medicine

## 2013-06-04 ENCOUNTER — Encounter: Payer: Self-pay | Admitting: Internal Medicine

## 2013-06-04 DIAGNOSIS — I6381 Other cerebral infarction due to occlusion or stenosis of small artery: Secondary | ICD-10-CM

## 2013-06-04 DIAGNOSIS — R531 Weakness: Secondary | ICD-10-CM

## 2013-06-04 DIAGNOSIS — M109 Gout, unspecified: Secondary | ICD-10-CM

## 2013-06-04 DIAGNOSIS — E1165 Type 2 diabetes mellitus with hyperglycemia: Secondary | ICD-10-CM

## 2013-06-04 DIAGNOSIS — E1140 Type 2 diabetes mellitus with diabetic neuropathy, unspecified: Secondary | ICD-10-CM

## 2013-06-04 DIAGNOSIS — E1142 Type 2 diabetes mellitus with diabetic polyneuropathy: Secondary | ICD-10-CM

## 2013-06-04 DIAGNOSIS — N183 Chronic kidney disease, stage 3 unspecified: Secondary | ICD-10-CM

## 2013-06-04 DIAGNOSIS — R1319 Other dysphagia: Secondary | ICD-10-CM

## 2013-06-04 DIAGNOSIS — I6350 Cerebral infarction due to unspecified occlusion or stenosis of unspecified cerebral artery: Secondary | ICD-10-CM

## 2013-06-04 DIAGNOSIS — I129 Hypertensive chronic kidney disease with stage 1 through stage 4 chronic kidney disease, or unspecified chronic kidney disease: Secondary | ICD-10-CM

## 2013-06-04 DIAGNOSIS — M6281 Muscle weakness (generalized): Secondary | ICD-10-CM

## 2013-06-04 DIAGNOSIS — IMO0002 Reserved for concepts with insufficient information to code with codable children: Secondary | ICD-10-CM

## 2013-06-04 DIAGNOSIS — I739 Peripheral vascular disease, unspecified: Secondary | ICD-10-CM

## 2013-06-04 DIAGNOSIS — N189 Chronic kidney disease, unspecified: Secondary | ICD-10-CM

## 2013-06-04 DIAGNOSIS — E1149 Type 2 diabetes mellitus with other diabetic neurological complication: Secondary | ICD-10-CM

## 2013-06-04 DIAGNOSIS — E785 Hyperlipidemia, unspecified: Secondary | ICD-10-CM

## 2013-06-04 NOTE — Progress Notes (Signed)
Patient ID: [name], female   DOB: 1917/05/13, 80 y.o.   MRN: [id number]     [facility]  PCP: Dorrene GermanAVBUERE,EDWIN A, MD  Code Status: full code  No Known Allergies  Chief Complaint: new admission  HPI:  80 y/o female patient is here for STR after hospital admission from 05/30/13- 06/02/13 with left sided weakness secondary to lacunar infarct CVA. 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 She has history of HTN, hyperlipidemia, dm type 2, PVD s/p angioplasty of left peroneal artery, CKD stage 3 among others. She is seen in her room with her daughter and speech therapist in the room. She complaints of occassional pain in her left arm and legs and she had sudden onset severe pain last night which has resolved now. She denies any pain or muscle tightness at present. She is on ground diet and has been pocketing her meals at times as per SLP team. No other concerns  Review of Systems:  Constitutional: Negative for fever, chills, weight loss, diaphoresis.  HENT: Negative for congestion, hearing loss and sore throat.   Eyes: Negative for eye pain, blurred vision, double vision and discharge.  Respiratory: Negative for cough, sputum production, shortness of breath and wheezing.   Cardiovascular: Negative for chest pain, palpitations, orthopnea and leg swelling.  Gastrointestinal: Negative for heartburn, nausea, vomiting, abdominal pain Genitourinary: Negative for dysuria, urgency, frequency, hematuria and flank pain.  Musculoskeletal: Negative for back pain, falls, joint pain and myalgias.  Skin: Negative for itching and rash.  Neurological: Negative for dizziness, tingling, focal weakness and headaches. has left sided weakness and slurred speech Psychiatric/Behavioral: Negative for depression and memory loss. The patient is not nervous/anxious.     Past Medical History  Diagnosis Date  . UTI (urinary tract infection)   . Hypertension   .  Diabetes mellitus   . Gout   . High cholesterol   . Complication of anesthesia   . Diabetic neuropathy   . PVD (peripheral vascular disease)   . Carotid artery occlusion   . Arthritis   . Chronic kidney disease   . GERD (gastroesophageal reflux disease)   . Vertigo   . Venous insufficiency   . Mood disorder   . Dysphagia    Past Surgical History  Procedure Laterality Date  . Hemorrhoid banding    . Abdominal hysterectomy    . Cardiac catheterization     Social History:   reports that she has never smoked. She has never used smokeless tobacco. She reports that she does not drink alcohol or use illicit drugs.  Family History  Problem Relation Age of Onset  . Heart disease Daughter   . Hypertension Daughter   . Diabetes Son   . Heart disease Son     Medications: Patient's Medications  New Prescriptions   No medications on file  Previous Medications   ALLOPURINOL (ZYLOPRIM) 100 MG TABLET    Take 100 mg by mouth 2 (two) times daily.   AMLODIPINE (NORVASC) 10 MG TABLET    Take 1 tablet (10 mg total) by mouth daily.   ASPIRIN EC 81 MG EC TABLET    Take 1 tablet (81 mg total) by mouth daily.   CADEXOMER IODINE (IODOSORB) 0.9 % GEL    Apply 1 application topically daily as needed for wound care (to big toe).   CETIRIZINE (ZYRTEC) 10 MG TABLET    Take 10 mg by mouth daily.  DONEPEZIL (ARICEPT) 10 MG TABLET    Take 10 mg by mouth at bedtime.    GABAPENTIN (NEURONTIN) 300 MG CAPSULE    Take 300 mg by mouth 3 (three) times daily.    GLIPIZIDE (GLUCOTROL) 5 MG TABLET    Take 5 mg by mouth 2 (two) times daily before a meal.   HYDROCODONE-ACETAMINOPHEN (NORCO/VICODIN) 5-325 MG PER TABLET    Take 1 tablet by mouth every 6 (six) hours as needed for moderate pain.   INSULIN ASPART (NOVOLOG) 100 UNIT/ML INJECTION    Inject 0-9 Units into the skin every 4 (four) hours.   INSULIN LISPRO PROT & LISPRO (HUMALOG MIX 75/25 KWIKPEN) (75-25) 100 UNIT/ML SUPN    Inject 20-30 Units into the skin 2  (two) times daily. Use 30 units every morning and 20 units every evening   PHENYLEPHRINE-SHARK LIVER OIL-MINERAL OIL-PETROLATUM (PREPARATION H) 0.25-3-14-71.9 % RECTAL OINTMENT    Place 1 application rectally 2 (two) times daily as needed for hemorrhoids.   ROSUVASTATIN (CRESTOR) 5 MG TABLET    Take 5 mg by mouth daily.   SENNA-DOCUSATE (SENOKOT-S) 8.6-50 MG PER TABLET    Take 1 tablet by mouth at bedtime as needed for moderate constipation.   VORTIOXETINE HBR (BRINTELLIX) 10 MG TABS    Take 10 mg by mouth daily.    WHEAT DEXTRIN (BENEFIBER DRINK MIX) PACK    Take 1 packet by mouth daily as needed (stool).   Modified Medications   No medications on file  Discontinued Medications   No medications on file     Physical Exam: Filed Vitals:   06/04/13 1219  BP: 138/78  Pulse: 70  Temp: 97.2 F (36.2 C)  Resp: 18  Weight: 131 lb (59.421 kg)    General- elderly female in no acute distress, intact cognition Head- atraumatic, normocephalic Eyes- PERRLA, EOMI, no pallor, no icterus, no discharge Neck- no lymphadenopathy Nose- normal nasal mucosa, no maxillary or frontal sinus tenderness Cardiovascular- normal s1,s2, no murmurs/ rubs/ gallops Respiratory- bilateral clear to auscultation, no wheeze, no rhonchi, no crackles, no use of accessory muscles Abdomen- bowel sounds present, soft, non tender, no guarding or rigidity, no CVA tenderness Pelvic exam- normal pelvic exam Musculoskeletal- left sided weakness, has splint to left arm and leg, no leg edema, on a wheelchair, slurred speech Neurological- no focal deficit Skin- warm and dry Psychiatry- alert and oriented to person, place and time, normal mood and affect   Labs reviewed: Basic Metabolic Panel:  Recent Labs  74/25/95 0305  05/30/13 0014 05/30/13 0026 06/02/13 0730  NA 139  < > 138 139 141  K 3.8  < > 4.7 4.6 3.8  CL 99  < > 95* 99 101  CO2 30  --  29  --  23  GLUCOSE 240*  < > 276* 272* 212*  BUN 17  < > 37* 37* 20    CREATININE 0.91  < > 1.59* 1.70* 0.90  CALCIUM 9.2  --  9.9  --  9.1  < > = values in this interval not displayed. Liver Function Tests:  Recent Labs  05/30/13 0014  AST 15  ALT 12  ALKPHOS 102  BILITOT <0.2*  PROT 7.7  ALBUMIN 3.1*   No results found for this basename: LIPASE, AMYLASE,  in the last 8760 hours No results found for this basename: AMMONIA,  in the last 8760 hours CBC:  Recent Labs  10/07/12 0012  05/05/13 1428 05/08/13 0305 05/26/13 6387 05/30/13 0014 05/30/13 0026  WBC 6.1  --  6.3 8.1  --  7.9  --   NEUTROABS 3.2  --  3.0  --   --  4.4  --   HGB 12.8  < > 12.9 12.2 14.6 12.9 13.9  HCT 38.8  < > 39.5 36.8 43.0 38.7 41.0  MCV 88.2  --  88.0 88.7  --  88.2  --   PLT 219  --  212 195  --  226  --   < > = values in this interval not displayed. Cardiac Enzymes:  Recent Labs  05/08/13 0601  TROPONINI <0.30   CBG:  Recent Labs  06/02/13 0403 06/02/13 0813 06/02/13 1213  GLUCAP 113* 175* 229*    Radiological Exams: Dg Chest 2 View  05/30/2013   CLINICAL DATA:  Stroke.  EXAM: CHEST  2 VIEW  COMPARISON:  DG CHEST 1V PORT dated 05/08/2013; CT ABD/PELVIS W CM dated 11/16/2011  FINDINGS: Stable enlarged cardiac and mediastinal contours. Persistent marked enlargement and tortuosity of the thoracic aorta. Unchanged probable calcified mediastinal lymph node and calcified pulmonary granulomas. No large consolidative pulmonary opacities. No definite pleural effusion or pneumothorax. Bilateral glenohumeral joint degenerative change.  IMPRESSION: Cardiomegaly.  Marked enlargement and tortuosity of the thoracic aorta.  Unchanged calcified granulomas and mediastinal lymph nodes.   Electronically Signed   By: Annia Belt M.D.   On: 05/30/2013 09:43   Ct Head (brain) Wo Contrast  05/30/2013   CLINICAL DATA:  Slurred speech, left arm/leg weakness  EXAM: CT HEAD WITHOUT CONTRAST  TECHNIQUE: Contiguous axial images were obtained from the base of the skull through the  vertex without intravenous contrast.  COMPARISON:  05/20/2012  FINDINGS: No evidence of parenchymal hemorrhage or extra-axial fluid collection. No mass lesion, mass effect, or midline shift.  No CT evidence of acute infarction.  Subcortical white matter and periventricular small vessel ischemic changes. Intracranial atherosclerosis.  Global cortical atrophy, likely age appropriate. No ventriculomegaly.  Partial opacification/mucosal thickening in the bilateral maxillary sinuses. Mastoid air cells are clear.  No evidence of calvarial fracture.  IMPRESSION: No evidence of acute intracranial abnormality.  Age related atrophy, small-vessel ischemic changes, and intracranial atherosclerosis.   Electronically Signed   By: Charline Bills M.D.   On: 05/30/2013 00:50   Mr Brain Wo Contrast  05/31/2013   CLINICAL DATA:  80 year old female with slurred speech and left side weakness. Stroke. Initial encounter.  EXAM: MRI HEAD WITHOUT CONTRAST  MRA HEAD WITHOUT CONTRAST  TECHNIQUE: Multiplanar, multiecho pulse sequences of the brain and surrounding structures were obtained without intravenous contrast. Angiographic images of the head were obtained using MRA technique without contrast.  COMPARISON:  Head CT without contrast 05/30/2013. Brain MRI 11/22/2011.  FINDINGS: MRI HEAD FINDINGS  The examination had to be discontinued prior to completion. Also, the study is intermittently degraded by motion artifact despite repeated imaging attempts. .  Cerebral volume is not significantly changed since 2013. Major intracranial vascular flow voids are stable.  There is a curvilinear area of restricted diffusion tracking from the posterior right corona radiata through the lentiform nuclei to the right posterior limb external capsule (series 6, image 16). T2 hyperintensity without mass effect or evidence of associated hemorrhage.  No contralateral or posterior fossa restricted diffusion.  Chronic cerebellar and brainstem lacunar  infarcts. Patchy and confluent cerebral white matter and deep gray matter T2 hyperintensity. No midline shift, mass effect, or evidence of intracranial mass lesion. No ventriculomegaly. Stable basilar cisterns. Stable pituitary, cervicomedullary junction and  visualized cervical spine. No extra-axial collection. Stable orbits soft tissues. Chronic maxillary sinus inflammatory changes. Other paranasal sinuses and mastoids remain clear. Bone marrow signal within normal limits.  MRA HEAD FINDINGS  Antegrade flow in the posterior circulation with codominant distal vertebral arteries. Tortuous distal vertebral arteries with dolichoectatic vertebrobasilar junction. Small 3 mm aneurysm at the left AICA origin (series 5, image 58, series 507, image 6).  Moderate to severe basilar artery irregularity but no hemodynamically significant basilar artery stenosis. There is a 7 mm cephalad directed basilar tip aneurysm (series 5, image 91, series 506, image 4). Moderate to severe irregularity and stenosis at both PCA origins. Nonetheless, distal PCA flow is preserved. Normal right posterior communicating artery, the left is diminutive.  Antegrade flow in both ICA siphons. Tortuous cavernous segments. Moderate to severely irregular supra clinoid segments with mild stenosis. Ophthalmic artery origins are within normal limits. Carotid termini are patent. MCA and ACA origins are patent.  Tortuous proximal ACA branches. Bilateral MCA M1 segments are patent with atherosclerotic irregularity left greater than right. Both MCA bifurcations are patent. Motion artifact degrades detail of the bilateral MCA and ACA branches. No major MCA branch occlusion is evident.  IMPRESSION: 1. Acute lacunar type infarct extending from the right corona radiata through the right lentiform nuclei to the right external capsule. No mass effect or evidence of associated hemorrhage. 2. Intracranial atherosclerosis with no major circle of Willis branch occlusion.  3. 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 4. Chronic small vessel ischemia.   Electronically Signed   By: Augusto Gamble M.D.   On: 05/31/2013 20:47   Dg Chest Port 1 View  05/08/2013   CLINICAL DATA:  Chest pain  EXAM: PORTABLE CHEST - 1 VIEW  COMPARISON:  05/20/2012  FINDINGS: Stable chronic cardiopericardial enlargement. Unchanged prominence of the thoracic aorta, likely from tortuosity rather than ectasia. No evidence of mediastinal hematoma. Calcified left mediastinal lymph nodes. Calcified granuloma at the right base, located in the liver. No edema, consolidation, effusion, or pneumothorax.  IMPRESSION: Stable appearance of the chest.  No evidence of acute disease.   Electronically Signed   By: Tiburcio Pea M.D.   On: 05/08/2013 04:17   Dg Foot Complete Left  05/05/2013   CLINICAL DATA:  80 year old female with soft tissue wound medial tip left great toe. Pain and infection. Diabetic. Initial encounter.  EXAM: LEFT FOOT - COMPLETE 3+ VIEW  COMPARISON:  None.  FINDINGS: There is a retained metal foreign body situated between the first and second metatarsals, approximately 14 mm in length.  Calcified atherosclerosis in the left foot an ankle. No subcutaneous gas.  Bone mineralization is within normal limits for age. The phalanges of the left great toe appear intact. No cortical osteolysis. Joint spaces are within normal limits for age.  Calcaneus intact.  No acute fracture or dislocation.  IMPRESSION: 1. No acute osseous abnormality in the left foot. No plain radiographic evidence of osteomyelitis. 2. Retained linear metal foreign body between the first and second metatarsals.   Electronically Signed   By: Augusto Gamble M.D.   On: 05/05/2013 14:03   Mr Maxine Glenn Head/brain Wo Cm  05/31/2013   CLINICAL DATA:  80 year old female with slurred speech and left side weakness. Stroke. Initial encounter.  EXAM: MRI HEAD WITHOUT CONTRAST  MRA HEAD WITHOUT CONTRAST  TECHNIQUE: Multiplanar,  multiecho pulse sequences of the brain and surrounding structures were obtained without intravenous contrast. Angiographic images of the head  were obtained using MRA technique without contrast.  COMPARISON:  Head CT without contrast 05/30/2013. Brain MRI 11/22/2011.  FINDINGS: MRI HEAD FINDINGS  The examination had to be discontinued prior to completion. Also, the study is intermittently degraded by motion artifact despite repeated imaging attempts. .  Cerebral volume is not significantly changed since 2013. Major intracranial vascular flow voids are stable.  There is a curvilinear area of restricted diffusion tracking from the posterior right corona radiata through the lentiform nuclei to the right posterior limb external capsule (series 6, image 16). T2 hyperintensity without mass effect or evidence of associated hemorrhage.  No contralateral or posterior fossa restricted diffusion.  Chronic cerebellar and brainstem lacunar infarcts. Patchy and confluent cerebral white matter and deep gray matter T2 hyperintensity. No midline shift, mass effect, or evidence of intracranial mass lesion. No ventriculomegaly. Stable basilar cisterns. Stable pituitary, cervicomedullary junction and visualized cervical spine. No extra-axial collection. Stable orbits soft tissues. Chronic maxillary sinus inflammatory changes. Other paranasal sinuses and mastoids remain clear. Bone marrow signal within normal limits.  MRA HEAD FINDINGS  Antegrade flow in the posterior circulation with codominant distal vertebral arteries. Tortuous distal vertebral arteries with dolichoectatic vertebrobasilar junction. Small 3 mm aneurysm at the left AICA origin (series 5, image 58, series 507, image 6).  Moderate to severe basilar artery irregularity but no hemodynamically significant basilar artery stenosis. There is a 7 mm cephalad directed basilar tip aneurysm (series 5, image 91, series 506, image 4). Moderate to severe irregularity and stenosis at  both PCA origins. Nonetheless, distal PCA flow is preserved. Normal right posterior communicating artery, the left is diminutive.  Antegrade flow in both ICA siphons. Tortuous cavernous segments. Moderate to severely irregular supra clinoid segments with mild stenosis. Ophthalmic artery origins are within normal limits. Carotid termini are patent. MCA and ACA origins are patent.  Tortuous proximal ACA branches. Bilateral MCA M1 segments are patent with atherosclerotic irregularity left greater than right. Both MCA bifurcations are patent. Motion artifact degrades detail of the bilateral MCA and ACA branches. No major MCA branch occlusion is evident.  IMPRESSION: 1. Acute lacunar type infarct extending from the right corona radiata through the right lentiform nuclei to the right external capsule. No mass effect or evidence of associated hemorrhage. 2. Intracranial atherosclerosis with no major circle of Willis branch occlusion. 3. 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 4. Chronic small vessel ischemia.   Electronically Signed   By: Augusto Gamble M.D.   On: 05/31/2013 20:47    Assessment/Plan  Left sided weakness- in setting of acute CVA. Will have her work with PT and OT team for gait training and strengthening exercises. Will also have her work with SLP team for speech and swallowing. Fall precautions. Pressure ulcer prophylaxis. Prn norco for pain and monitor bowel regimen  CVA- acute lacunar infarct with 7mm basilar artery aneurysm and 3 mm aneurysm at the left AICA noted on brain imaging. Monitor bp readings. Continue statin and baby aspirin  Dysphagia- continue ground texture diet and will get SLP evaluation  Hyperlipidemia- continue statin  Dm type 2- a1c 10. Will need close sugar monitoring and good sugar control. Continue glipizide 5 mg bid  HTN- well controlled bp at present. Continue norvasc 10 mg daily   Diabetic neuropathy- continue gabapentin  Peripheral  vascular disease, unspecified- had recent angioplasty    Dementia- has good cognition. continue aricept    Gout- no recent attack. continue allopurinol  CKD- monitor renal function. Avoid NSAIDs  Family/ staff Communication: reviewed care plan with patient and nursing supervisor   Goals of care: STR  Labs/tests ordered: cbc, cmp    Oneal Grout, MD  [facility] [phone] (Monday-Friday 8 am - 5 pm) [phone] (afterhours)

## 2013-06-05 DIAGNOSIS — I129 Hypertensive chronic kidney disease with stage 1 through stage 4 chronic kidney disease, or unspecified chronic kidney disease: Secondary | ICD-10-CM | POA: Insufficient documentation

## 2013-06-05 DIAGNOSIS — I739 Peripheral vascular disease, unspecified: Secondary | ICD-10-CM | POA: Insufficient documentation

## 2013-06-05 DIAGNOSIS — E1140 Type 2 diabetes mellitus with diabetic neuropathy, unspecified: Secondary | ICD-10-CM | POA: Insufficient documentation

## 2013-06-05 DIAGNOSIS — I6381 Other cerebral infarction due to occlusion or stenosis of small artery: Secondary | ICD-10-CM | POA: Insufficient documentation

## 2013-06-05 DIAGNOSIS — IMO0002 Reserved for concepts with insufficient information to code with codable children: Secondary | ICD-10-CM | POA: Insufficient documentation

## 2013-06-05 DIAGNOSIS — N183 Chronic kidney disease, stage 3 unspecified: Secondary | ICD-10-CM | POA: Insufficient documentation

## 2013-06-05 DIAGNOSIS — E785 Hyperlipidemia, unspecified: Secondary | ICD-10-CM | POA: Insufficient documentation

## 2013-06-05 DIAGNOSIS — E1165 Type 2 diabetes mellitus with hyperglycemia: Secondary | ICD-10-CM

## 2013-06-19 ENCOUNTER — Non-Acute Institutional Stay (SKILLED_NURSING_FACILITY): Payer: Medicare Other | Admitting: Internal Medicine

## 2013-06-19 DIAGNOSIS — S91309A Unspecified open wound, unspecified foot, initial encounter: Secondary | ICD-10-CM

## 2013-06-19 DIAGNOSIS — E1159 Type 2 diabetes mellitus with other circulatory complications: Secondary | ICD-10-CM

## 2013-06-23 NOTE — Progress Notes (Addendum)
Patient ID: [name], female   DOB: February 21, 1918, 80 y.o.   MRN: [id number]                    PROGRESS NOTE  DATE:  06/19/2013    [facility]    LEVEL OF CARE:   SNF   Acute Visit   CHIEF COMPLAINT:  Review of left great toe.    HISTORY OF PRESENT ILLNESS:  This is a patient who is a type 2 diabetic with a history of peripheral vascular disease.  She came to us after a stay at hospital in late March for a lacunar infarct in the right corona radiata.    She arrived here with what looks to be an ischemic area on the tip of her left great toe.  She has known peripheral vascular disease.  Vascular studies in [facility] from October 2014 showed monophasic waveforms in the anterior tibial artery and absent posterior tibial artery waveforms.  She also has a history of diabetic neuropathy, on gabapentin.  According to her records, she has also had a recent angioplasty.  She had been seen in February by Dr. Myra GianottiBrabham of Vascular Surgery.  At that point, she had a left leg ulcer which had healed.  She is also  apparently known to [facility] Tanda [facility]ersNichols [facility] [place] and  apparently has some metal in her foot between the first and second toes on the left.    In any case, staff have noted a change in the condition of the toe with a change in the texture of her skin.  The patient, herself, does not complain of pain.  However, I think this may be because she is reasonably insensate.  We have been using Iodosorb to the tip of the toe.     PHYSICAL EXAMINATION:   SKIN:  INSPECTION:  Left foot:  Her peripheral pulses are absent in the foot, dorsalis pedis and posterior tibial, although she has a very good popliteal pulse on the left.  The left toe, itself, has what appears to be an ischemic cap on the tip of the toe.  The skin surrounding the tip of the toe, extending more proximally to just proximal to the interphalangeal joint, appears to have a "empty sensation".  This  seems to demarcate.  At this point, I cannot tell whether this is simply just progressive ischemia and/or coexistent infection.    ASSESSMENT/PLAN:  Severe diabetic peripheral vascular disease.  I have reviewed this on [facility], as noted above.  She is known to Vascular Surgery.     Change in condition of the left toe.  I think this is probably progressive ischemia as it appears to be demarcating.  Nevertheless, I cannot rule out coexistent infection here and I am going to start her on antibiotic therapy, probably with Augmentin if she is not allergic.    Lab work had already been ordered on this patient.  I do not think that Iodosorb would do any good on top of an eschar cap.  I have changed this to Santyl.  I cannot rule out a follow-up with Vascular Surgery.   However, I will try and see this next week.

## 2013-06-29 ENCOUNTER — Other Ambulatory Visit (HOSPITAL_COMMUNITY): Payer: Medicare Other

## 2013-06-29 ENCOUNTER — Encounter: Payer: Medicare Other | Admitting: Surgery

## 2013-06-29 ENCOUNTER — Encounter (HOSPITAL_COMMUNITY): Payer: Medicare Other

## 2013-07-06 ENCOUNTER — Encounter: Payer: Medicare Other | Admitting: Surgery

## 2013-07-06 ENCOUNTER — Other Ambulatory Visit (HOSPITAL_COMMUNITY): Payer: Medicare Other

## 2013-07-06 ENCOUNTER — Encounter (HOSPITAL_COMMUNITY): Payer: Medicare Other

## 2013-07-08 ENCOUNTER — Non-Acute Institutional Stay (SKILLED_NURSING_FACILITY): Payer: Medicare Other | Admitting: Internal Medicine

## 2013-07-08 DIAGNOSIS — E1129 Type 2 diabetes mellitus with other diabetic kidney complication: Secondary | ICD-10-CM

## 2013-07-08 DIAGNOSIS — I129 Hypertensive chronic kidney disease with stage 1 through stage 4 chronic kidney disease, or unspecified chronic kidney disease: Secondary | ICD-10-CM

## 2013-07-08 DIAGNOSIS — N183 Chronic kidney disease, stage 3 unspecified: Secondary | ICD-10-CM

## 2013-07-08 DIAGNOSIS — E1142 Type 2 diabetes mellitus with diabetic polyneuropathy: Secondary | ICD-10-CM

## 2013-07-08 DIAGNOSIS — E1149 Type 2 diabetes mellitus with other diabetic neurological complication: Secondary | ICD-10-CM

## 2013-07-08 DIAGNOSIS — N189 Chronic kidney disease, unspecified: Secondary | ICD-10-CM

## 2013-07-08 DIAGNOSIS — E1140 Type 2 diabetes mellitus with diabetic neuropathy, unspecified: Secondary | ICD-10-CM

## 2013-07-10 NOTE — Progress Notes (Signed)
         PROGRESS NOTE  DATE: 07/08/2013  FACILITY: Nursing Home Location: [facility]  LEVEL OF CARE: SNF (31)  Routine Visit  CHIEF COMPLAINT:  Manage diabetes mellitus, chronic kidney disease stage III and neuropathy  HISTORY OF PRESENT ILLNESS:  REASSESSMENT OF ONGOING PROBLEM(S):  DM:pt's DM remains stable.  Pt denies polyuria, polydipsia, polyphagia, changes in vision or hypoglycemic episodes.  No complications noted from the medication presently being used.  Last hemoglobin A1c is: Not available.  CHRONIC KIDNEY DISEASE: The patient's chronic kidney disease remains stable.  Patient denies increasing lower extremity swelling or confusion. Last BUN and creatinine are: Not available.  PERIPHERAL NEUROPATHY: The peripheral neuropathy is stable. The patient denies pain in the feet, tingling, and numbness. No complications noted from the medication presently being used.  PAST MEDICAL HISTORY : Reviewed.  No changes/see problem list  CURRENT MEDICATIONS: Reviewed per MAR/see medication list  REVIEW OF SYSTEMS:  GENERAL: no change in appetite, no fatigue, no weight changes, no fever, chills or weakness RESPIRATORY: no cough, SOB, DOE, wheezing, hemoptysis CARDIAC: no chest pain, edema or palpitations GI: no abdominal pain, diarrhea, constipation, heart burn, nausea or vomiting  PHYSICAL EXAMINATION  VS:  See VS section  GENERAL: no acute distress, moderately obese body habitus EYES: conjunctivae normal, sclerae normal, normal eye lids NECK: supple, trachea midline, no neck masses, no thyroid tenderness, no thyromegaly LYMPHATICS: no LAN in the neck, no supraclavicular LAN RESPIRATORY: breathing is even & unlabored, BS CTAB CARDIAC: RRR, no murmur,no extra heart sounds, no edema GI: abdomen soft, normal BS, no masses, no tenderness, no hepatomegaly, no splenomegaly PSYCHIATRIC: the patient is alert & oriented to person, affect & behavior  appropriate  LABS/RADIOLOGY: None  ASSESSMENT/PLAN:  Diabetes mellitus with renal complications-check hemoglobin A1c Chronic kidney disease stage III-check renal functions Neuropathy-denies ongoing symptoms Renovascular hypertension-well-controlled Hyperlipidemia-check fasting lipid panel Dementia-check TSH and vitamin B12 level CVA-stable Gout-continue allopurinol Allergic rhinitis-well-controlled Check CBC and CMP  CPT CODE: [id number]  Angela CoxGayani Y Tran Arzuaga, MD [facility] [phone]

## 2013-07-17 ENCOUNTER — Non-Acute Institutional Stay (SKILLED_NURSING_FACILITY): Payer: Medicare Other | Admitting: Internal Medicine

## 2013-07-17 DIAGNOSIS — S91109A Unspecified open wound of unspecified toe(s) without damage to nail, initial encounter: Secondary | ICD-10-CM | POA: Insufficient documentation

## 2013-07-17 DIAGNOSIS — E1159 Type 2 diabetes mellitus with other circulatory complications: Secondary | ICD-10-CM

## 2013-07-17 NOTE — Progress Notes (Signed)
Patient ID: [name], female   DOB: 08-28-1917, 80 y.o.   MRN: [id number]                     PROGRESS NOTE  DATE:  07/17/2013   [facility]    LEVEL OF CARE:   SNF   Acute Visit   CHIEF COMPLAINT:  Review of left great toe.    HISTORY OF PRESENT ILLNESS:  This is a patient who is a type 2 diabetic with a history of peripheral vascular disease.  She came to us after a stay at hospital in late March for a lacunar infarct in the right corona radiata.  I reviewed a wound on the tip of her great toe roughly a month ago. At that point in time I thought all of this might be ischemic as some necrotic tissue had been demarcating, however I couldn't really rule out coexistent cellulitis and we did a course of Augmentin. Although the patient was quite adamant about the use of Iodosorb on this wound, I actually changed her to [facility] as the wound had a necrotic eschar over the surface. I am following up on this.  She arrived here with what looks to be an ischemic area on the tip of her left great toe.  She has known peripheral vascular disease.  [facility] October 2014 showed monophasic waveforms in the anterior tibial artery and absent posterior tibial artery waveforms.  She also has a history of diabetic neuropathy, on gabapentin.  According to her records, she has also had a recent angioplasty.  She had been seen in February by Dr. Myra GianottiBrabham of Vascular Surgery.  At that point, she had a left leg ulcer which had healed.  She is also  apparently known to Dr. Tanda [facility]ersNichols at [facility] in [place] and  apparently has some metal in her foot between the first and second toes on the left.         PHYSICAL EXAMINATION:   SKIN:  INSPECTION:  Left foot:  Her peripheral pulses are absent in the foot, dorsalis pedis and posterior tibial, although she has a very good popliteal pulse on the left.  The left toe, itself has actually improved since last time  I saw this.  She's been left with a wound on the tip of the toe that is covered with surface eschar. We cleaned this with topical Proviodine I, anesthetized with topical lidocaine and a debridement this with a #15 scalpel to remove the surface eschar. The whole area looks better than the last time I saw this therefore I think there will is more of an acute infection present a month ago and I gave credit for.  ASSESSMENT/PLAN:  Severe diabetic peripheral vascular disease.  I have reviewed this on [facility], as noted above.  She is known to Vascular Surgery.  I am doubtful that she will be a candidate for any further revascularization as most of her ischemia appears to be distal. She already has a angioplasty in the left leg and is known to vascular.  The wound has undergone a nonsurgical debridement. After debridement the base of this had some bleeding and actually looks tentatively surprisingly good.   I am going to continue to [facility] based dressings. If the bed of the wound cleans up then I think we can change to a collagen hydrogel-based dressing. As stated above repetitively she has severe distal peripheral vascular disease, I am doubtful  that she will be a candidate for any form of revascularization at this point however if things deteriorate I will refer her back to vascular.

## 2013-08-03 ENCOUNTER — Non-Acute Institutional Stay (SKILLED_NURSING_FACILITY): Payer: Medicare Other | Admitting: Internal Medicine

## 2013-08-03 DIAGNOSIS — N183 Chronic kidney disease, stage 3 unspecified: Secondary | ICD-10-CM

## 2013-08-03 DIAGNOSIS — I129 Hypertensive chronic kidney disease with stage 1 through stage 4 chronic kidney disease, or unspecified chronic kidney disease: Secondary | ICD-10-CM

## 2013-08-03 DIAGNOSIS — E1149 Type 2 diabetes mellitus with other diabetic neurological complication: Secondary | ICD-10-CM

## 2013-08-03 DIAGNOSIS — N189 Chronic kidney disease, unspecified: Secondary | ICD-10-CM

## 2013-08-03 DIAGNOSIS — E1129 Type 2 diabetes mellitus with other diabetic kidney complication: Secondary | ICD-10-CM

## 2013-08-03 DIAGNOSIS — E1142 Type 2 diabetes mellitus with diabetic polyneuropathy: Secondary | ICD-10-CM

## 2013-08-03 DIAGNOSIS — E1140 Type 2 diabetes mellitus with diabetic neuropathy, unspecified: Secondary | ICD-10-CM

## 2013-08-03 NOTE — Progress Notes (Signed)
         PROGRESS NOTE  DATE: 08-03-13  FACILITY: Nursing Home Location: [facility]  LEVEL OF CARE: SNF (31)  Routine Visit  CHIEF COMPLAINT:  Manage diabetes mellitus, chronic kidney disease stage III and neuropathy  HISTORY OF PRESENT ILLNESS:  REASSESSMENT OF ONGOING PROBLEM(S):  DM:pt's DM remains stable.  Pt denies polyuria, polydipsia, polyphagia, changes in vision or hypoglycemic episodes.  No complications noted from the medication presently being used.  Last hemoglobin A1c is: Not available.  CHRONIC KIDNEY DISEASE: The patient's chronic kidney disease remains stable.  Patient denies increasing lower extremity swelling or confusion. Last BUN and creatinine are: Not available.  PERIPHERAL NEUROPATHY: The peripheral neuropathy is stable. The patient denies pain in the feet, tingling, and numbness. No complications noted from the medication presently being used.  PAST MEDICAL HISTORY : Reviewed.  No changes/see problem list  CURRENT MEDICATIONS: Reviewed per MAR/see medication list  REVIEW OF SYSTEMS:  GENERAL: no change in appetite, no fatigue, no weight changes, no fever, chills or weakness RESPIRATORY: no cough, SOB, DOE, wheezing, hemoptysis CARDIAC: no chest pain, edema or palpitations GI: no abdominal pain, diarrhea, constipation, heart burn, nausea or vomiting  PHYSICAL EXAMINATION  VS:  See VS section  GENERAL: no acute distress, moderately obese body habitus NECK: supple, trachea midline, no neck masses, no thyroid tenderness, no thyromegaly RESPIRATORY: breathing is even & unlabored, BS CTAB CARDIAC: RRR, no murmur,no extra heart sounds, no edema GI: abdomen soft, normal BS, no masses, no tenderness, no hepatomegaly, no splenomegaly PSYCHIATRIC: the patient is alert & oriented to person, affect & behavior appropriate  LABS/RADIOLOGY: None  ASSESSMENT/PLAN:  Diabetes mellitus with renal complications-check hemoglobin A1c Chronic  kidney disease stage III-check renal functions Neuropathy-denies ongoing symptoms Renovascular hypertension-blood pressure elevated. Will review a log. Hyperlipidemia-check fasting lipid panel Dementia-check TSH and vitamin B12 level CVA-stable Gout-continue allopurinol Allergic rhinitis-well-controlled Check CBC and CMP  CPT CODE: 40814  Angela Cox, MD [facility] [phone]

## 2013-08-12 ENCOUNTER — Non-Acute Institutional Stay (SKILLED_NURSING_FACILITY): Payer: Medicare Other | Admitting: Internal Medicine

## 2013-08-12 DIAGNOSIS — E1129 Type 2 diabetes mellitus with other diabetic kidney complication: Secondary | ICD-10-CM

## 2013-08-12 DIAGNOSIS — E1165 Type 2 diabetes mellitus with hyperglycemia: Principal | ICD-10-CM

## 2013-08-12 DIAGNOSIS — N189 Chronic kidney disease, unspecified: Secondary | ICD-10-CM

## 2013-08-12 DIAGNOSIS — I129 Hypertensive chronic kidney disease with stage 1 through stage 4 chronic kidney disease, or unspecified chronic kidney disease: Secondary | ICD-10-CM

## 2013-08-17 DIAGNOSIS — E1129 Type 2 diabetes mellitus with other diabetic kidney complication: Secondary | ICD-10-CM | POA: Insufficient documentation

## 2013-08-17 DIAGNOSIS — E1165 Type 2 diabetes mellitus with hyperglycemia: Principal | ICD-10-CM

## 2013-08-17 NOTE — Progress Notes (Signed)
Patient ID: [name], female   DOB: 11-19-1917, 80 y.o.   MRN: [id number]            PROGRESS NOTE  DATE: 08/12/2013       [facility]  LEVEL OF CARE: SNF (31)  Acute Visit  CHIEF COMPLAINT:  Manage diabetes mellitus.    HISTORY OF PRESENT ILLNESS: I was requested by the staff to assess the patient regarding above problem(s):  DM:pt's DM is unstable.  Pt denies polyuria, polydipsia, polyphagia, changes in vision or hypoglycemic episodes.  No complications noted from the medication presently being used.  Last hemoglobin A1c is:   On 08/11/2013:  Hemoglobin A1c 7.3.  CBGs before breakfast run from a low of 53 to a high of 172.  At 11:30, values run from 87 to 211.  At 4:30 p.m., values are in the 200 range as well as at 9 p.m.    PAST MEDICAL HISTORY : Reviewed.  No changes/see problem list  CURRENT MEDICATIONS: Reviewed per MAR/see medication list  REVIEW OF SYSTEMS:  GENERAL: no change in appetite, no fatigue, no weight changes, no fever, chills or weakness RESPIRATORY: no cough, SOB, DOE,, wheezing, hemoptysis CARDIAC: no chest pain, edema or palpitations GI: no abdominal pain, diarrhea, constipation, heart burn, nausea or vomiting  PHYSICAL EXAMINATION  VS:  T 97.7       P 62      RR 18      BP 158/92     POX 94%       WT (Lb) 138      GENERAL: no acute distress, normal body habitus NECK: supple, trachea midline, no neck masses, no thyroid tenderness, no thyromegaly RESPIRATORY: breathing is even & unlabored, BS CTAB CARDIAC: RRR, no murmur,no extra heart sounds, no edema GI: abdomen soft, normal BS, no masses, no tenderness, no hepatomegaly, no splenomegaly PSYCHIATRIC: the patient is alert & oriented to person, affect & behavior appropriate  ASSESSMENT/PLAN:  Diabetes mellitus with renal complications.  Uncontrolled problem.  Suppertime and before bedtime CBGs are elevated.  Therefore, increase NovoLog mix 75/25 to 33 U q.a.m.      Renovascular hypertension.  Last blood pressure elevated.  We will monitor.    CPT CODE: [id number]        Angela CoxGayani Y Eviana Sibilia, MD [facility] [phone]

## 2013-09-01 ENCOUNTER — Non-Acute Institutional Stay (SKILLED_NURSING_FACILITY): Payer: Medicare Other | Admitting: Internal Medicine

## 2013-09-01 DIAGNOSIS — Z5189 Encounter for other specified aftercare: Secondary | ICD-10-CM

## 2013-09-01 DIAGNOSIS — S91109A Unspecified open wound of unspecified toe(s) without damage to nail, initial encounter: Secondary | ICD-10-CM

## 2013-09-01 DIAGNOSIS — I69959 Hemiplegia and hemiparesis following unspecified cerebrovascular disease affecting unspecified side: Secondary | ICD-10-CM

## 2013-09-01 DIAGNOSIS — E1129 Type 2 diabetes mellitus with other diabetic kidney complication: Secondary | ICD-10-CM

## 2013-09-01 DIAGNOSIS — S91109D Unspecified open wound of unspecified toe(s) without damage to nail, subsequent encounter: Secondary | ICD-10-CM

## 2013-09-02 ENCOUNTER — Other Ambulatory Visit (HOSPITAL_BASED_OUTPATIENT_CLINIC_OR_DEPARTMENT_OTHER): Payer: Self-pay | Admitting: Internal Medicine

## 2013-09-08 NOTE — Progress Notes (Signed)
Patient ID: [name], female   DOB: 11/20/1917, 80 y.o.   MRN: [id number]                  PROGRESS NOTE  DATE:  09/01/2013      FACILITY: [facility]    LEVEL OF CARE:   SNF   Discharge Visit   HISTORY OF PRESENT ILLNESS:  This is a 80 year-old lady who came to us in early April after being in hospital from 05/23/2013 through 05/30/2013 with left-sided weakness secondary to a right lacunar infarct.  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    She has a history of hypertension, hyperlipidemia, and type 2 diabetes, as well as peripheral vascular disease and chronic renal failure.     She has made a decent rehabilitation effort.  She is able to stand and walk with a gait belt and a walker.  She will definitely need a lightweight wheelchair at home.    She is being planned for discharge and  apparently she is going to a daughter's home.  They have already built a ramp.    I do not know this patient that well.  She has been cared for by our service.    She has a wound on the tip of her left toe which is clean, linear, but with some depth.  We have been using collagen and hydrogel on this.  I think there has been improvement here although I am not at all certain and I will check with the wound care team.    PHYSICAL EXAMINATION:   GENERAL APPEARANCE:  The patient is not in any distress.  She is aware that she is going home tomorrow and where she is going to.   CHEST/RESPIRATORY:  Clear air entry bilaterally.   CARDIOVASCULAR:  CARDIAC:   Heart sounds are normal.  She appears to be euvolemic.   NEUROLOGICAL:    SENSATION/STRENGTH:  She has no use of her left arm, possible use of the left leg.   BALANCE/GAIT:  I did not attempt to walk her today.    ASSESSMENT/PLAN:  Status post right lacunar infarct with left upper greater than lower extremity weakness.  She has reached her full rehabilitation potential.    Type 2  diabetes with multiple complications including nephropathy, neuropathy, macrovascular disease.  Last lab work on 07/29/2013 showed a BUN of 15, a creatinine of 1, estimated GFR of 56.  Her last hemoglobin A1c on 08/11/2013 was 7.3.  In reviewing her diabetes, she is on NovoLog 75/25 at 30 U in the morning and 20 U at night.  Her blood sugars remain adequately controlled.  She is also on Glucotrol 5 mg b.i.d., which I think is probably superfluous given that amount of insulin.  Nevertheless, I have not changed this in preparation for discharge.    CURRENT MEDICATIONS:   Discharge medications include:    Norvasc 10 q.d.     Enteric-coated aspirin 81 q.d.    Brintellix 10 mg daily.    Zyrtec 10 q.d.    Lipitor 20 q.d.     Allopurinol 100 b.i.d.     Neurontin 300 three times a day.     Aricept 10 q.d.     Insulin dosage as noted above.    The patient will be going home with a lightweight wheelchair with leg rests, quad-cane with a gait belt.  She will need  a 3-in-1 commode.  Home health OT/PT has been ordered.  She will need skilled nursing for wound care.  I will try to see the progress of her wound from the wound care team before she leaves.  She will need diabetic syringes and diabetic test strips and a glucometer.    CPT CODE: [id number] (greater than 30 minutes spent in discussion with Nursing, Physical Therapy, and Wound Care)

## 2013-09-27 ENCOUNTER — Other Ambulatory Visit (HOSPITAL_BASED_OUTPATIENT_CLINIC_OR_DEPARTMENT_OTHER): Payer: Self-pay | Admitting: Internal Medicine

## 2013-10-02 ENCOUNTER — Emergency Department (HOSPITAL_COMMUNITY): Payer: Medicare Other

## 2013-10-02 ENCOUNTER — Encounter (HOSPITAL_COMMUNITY): Payer: Self-pay | Admitting: Emergency Medicine

## 2013-10-02 ENCOUNTER — Emergency Department (HOSPITAL_COMMUNITY)
Admission: EM | Admit: 2013-10-02 | Discharge: 2013-10-02 | Disposition: A | Discharge status: Home / Self Care | Payer: Medicare Other | Attending: Emergency Medicine | Admitting: Emergency Medicine

## 2013-10-02 DIAGNOSIS — E7800 Pure hypercholesterolemia, unspecified: Secondary | ICD-10-CM | POA: Diagnosis not present

## 2013-10-02 DIAGNOSIS — Y9389 Activity, other specified: Secondary | ICD-10-CM | POA: Diagnosis not present

## 2013-10-02 DIAGNOSIS — M109 Gout, unspecified: Secondary | ICD-10-CM | POA: Insufficient documentation

## 2013-10-02 DIAGNOSIS — M129 Arthropathy, unspecified: Secondary | ICD-10-CM | POA: Insufficient documentation

## 2013-10-02 DIAGNOSIS — I2510 Atherosclerotic heart disease of native coronary artery without angina pectoris: Secondary | ICD-10-CM | POA: Diagnosis not present

## 2013-10-02 DIAGNOSIS — Z87440 Personal history of urinary (tract) infections: Secondary | ICD-10-CM | POA: Diagnosis not present

## 2013-10-02 DIAGNOSIS — Z79899 Other long term (current) drug therapy: Secondary | ICD-10-CM | POA: Diagnosis not present

## 2013-10-02 DIAGNOSIS — F39 Unspecified mood [affective] disorder: Secondary | ICD-10-CM | POA: Diagnosis not present

## 2013-10-02 DIAGNOSIS — R410 Disorientation, unspecified: Secondary | ICD-10-CM

## 2013-10-02 DIAGNOSIS — S0003XA Contusion of scalp, initial encounter: Secondary | ICD-10-CM | POA: Insufficient documentation

## 2013-10-02 DIAGNOSIS — N189 Chronic kidney disease, unspecified: Secondary | ICD-10-CM | POA: Diagnosis not present

## 2013-10-02 DIAGNOSIS — E1149 Type 2 diabetes mellitus with other diabetic neurological complication: Secondary | ICD-10-CM | POA: Insufficient documentation

## 2013-10-02 DIAGNOSIS — Y92009 Unspecified place in unspecified non-institutional (private) residence as the place of occurrence of the external cause: Secondary | ICD-10-CM | POA: Insufficient documentation

## 2013-10-02 DIAGNOSIS — Z8719 Personal history of other diseases of the digestive system: Secondary | ICD-10-CM | POA: Insufficient documentation

## 2013-10-02 DIAGNOSIS — Z9889 Other specified postprocedural states: Secondary | ICD-10-CM | POA: Insufficient documentation

## 2013-10-02 DIAGNOSIS — W1809XA Striking against other object with subsequent fall, initial encounter: Secondary | ICD-10-CM | POA: Diagnosis not present

## 2013-10-02 DIAGNOSIS — F29 Unspecified psychosis not due to a substance or known physiological condition: Secondary | ICD-10-CM | POA: Diagnosis not present

## 2013-10-02 DIAGNOSIS — Z7982 Long term (current) use of aspirin: Secondary | ICD-10-CM | POA: Diagnosis not present

## 2013-10-02 DIAGNOSIS — Z794 Long term (current) use of insulin: Secondary | ICD-10-CM | POA: Insufficient documentation

## 2013-10-02 DIAGNOSIS — Z8673 Personal history of transient ischemic attack (TIA), and cerebral infarction without residual deficits: Secondary | ICD-10-CM | POA: Diagnosis not present

## 2013-10-02 DIAGNOSIS — S0083XA Contusion of other part of head, initial encounter: Secondary | ICD-10-CM | POA: Insufficient documentation

## 2013-10-02 DIAGNOSIS — S0990XA Unspecified injury of head, initial encounter: Secondary | ICD-10-CM | POA: Diagnosis present

## 2013-10-02 DIAGNOSIS — I129 Hypertensive chronic kidney disease with stage 1 through stage 4 chronic kidney disease, or unspecified chronic kidney disease: Secondary | ICD-10-CM | POA: Diagnosis not present

## 2013-10-02 DIAGNOSIS — E1142 Type 2 diabetes mellitus with diabetic polyneuropathy: Secondary | ICD-10-CM | POA: Diagnosis not present

## 2013-10-02 DIAGNOSIS — S1093XA Contusion of unspecified part of neck, initial encounter: Secondary | ICD-10-CM

## 2013-10-02 LAB — CBC WITH DIFFERENTIAL/PLATELET
BASOS ABS: 0 10*3/uL (ref 0.0–0.1)
BASOS PCT: 0 % (ref 0–1)
EOS ABS: 0.3 10*3/uL (ref 0.0–0.7)
EOS PCT: 4 % (ref 0–5)
HEMATOCRIT: 39.5 % (ref 36.0–46.0)
Hemoglobin: 12.5 g/dL (ref 12.0–15.0)
Lymphocytes Relative: 33 % (ref 12–46)
Lymphs Abs: 2.7 10*3/uL (ref 0.7–4.0)
MCH: 27.7 pg (ref 26.0–34.0)
MCHC: 31.6 g/dL (ref 30.0–36.0)
MCV: 87.6 fL (ref 78.0–100.0)
MONO ABS: 0.4 10*3/uL (ref 0.1–1.0)
Monocytes Relative: 5 % (ref 3–12)
Neutro Abs: 4.6 10*3/uL (ref 1.7–7.7)
Neutrophils Relative %: 58 % (ref 43–77)
PLATELETS: 246 10*3/uL (ref 150–400)
RBC: 4.51 MIL/uL (ref 3.87–5.11)
RDW: 17 % — ABNORMAL HIGH (ref 11.5–15.5)
WBC: 8 10*3/uL (ref 4.0–10.5)

## 2013-10-02 LAB — URINALYSIS, ROUTINE W REFLEX MICROSCOPIC
Bilirubin Urine: NEGATIVE
GLUCOSE, UA: NEGATIVE mg/dL
Hgb urine dipstick: NEGATIVE
KETONES UR: NEGATIVE mg/dL
Leukocytes, UA: NEGATIVE
NITRITE: NEGATIVE
PH: 6.5 (ref 5.0–8.0)
PROTEIN: NEGATIVE mg/dL
Specific Gravity, Urine: 1.02 (ref 1.005–1.030)
Urobilinogen, UA: 0.2 mg/dL (ref 0.0–1.0)

## 2013-10-02 LAB — COMPREHENSIVE METABOLIC PANEL
ALT: 13 U/L (ref 0–35)
AST: 24 U/L (ref 0–37)
Albumin: 3.6 g/dL (ref 3.5–5.2)
Alkaline Phosphatase: 106 U/L (ref 39–117)
Anion gap: 13 (ref 5–15)
BUN: 17 mg/dL (ref 6–23)
CO2: 31 mEq/L (ref 19–32)
CREATININE: 0.98 mg/dL (ref 0.50–1.10)
Calcium: 9.6 mg/dL (ref 8.4–10.5)
Chloride: 100 mEq/L (ref 96–112)
GFR, EST AFRICAN AMERICAN: 56 mL/min — AB (ref >90)
GFR, EST NON AFRICAN AMERICAN: 48 mL/min — AB (ref >90)
Glucose, Bld: 107 mg/dL — ABNORMAL HIGH (ref 70–99)
Potassium: 3.9 mEq/L (ref 3.7–5.3)
SODIUM: 144 meq/L (ref 137–147)
TOTAL PROTEIN: 8.3 g/dL (ref 6.0–8.3)
Total Bilirubin: 0.2 mg/dL — ABNORMAL LOW (ref 0.3–1.2)

## 2013-10-02 LAB — LIPASE, BLOOD: Lipase: 110 U/L — ABNORMAL HIGH (ref 11–59)

## 2013-10-02 LAB — I-STAT CG4 LACTIC ACID, ED: Lactic Acid, Venous: 1.25 mmol/L (ref 0.5–2.2)

## 2013-10-02 LAB — TROPONIN I: Troponin I: <0.3 ng/mL (ref <0.30)

## 2013-10-02 MED ORDER — SODIUM CHLORIDE 0.9 % IV BOLUS (SEPSIS)
500.0000 mL | Freq: Once | INTRAVENOUS | Status: AC
  Administered 2013-10-02: 500 mL via INTRAVENOUS

## 2013-10-02 NOTE — Discharge Instructions (Signed)
Head Injury °You have received a head injury. It does not appear serious at this time. Headaches and vomiting are common following head injury. It should be easy to awaken from sleeping. Sometimes it is necessary for you to stay in the emergency department for a while for observation. Sometimes admission to the hospital may be needed. After injuries such as yours, most problems occur within the first 24 hours, but side effects may occur up to 7-10 days after the injury. It is important for you to carefully monitor your condition and contact your health care provider or seek immediate medical care if there is a change in your condition. °WHAT ARE THE TYPES OF HEAD INJURIES? °Head injuries can be as minor as a bump. Some head injuries can be more severe. More severe head injuries include: °· A jarring injury to the brain (concussion). °· A bruise of the brain (contusion). This mean there is bleeding in the brain that can cause swelling. °· A cracked skull (skull fracture). °· Bleeding in the brain that collects, clots, and forms a bump (hematoma). °WHAT CAUSES A HEAD INJURY? °A serious head injury is most likely to happen to someone who is in a car wreck and is not wearing a seat belt. Other causes of major head injuries include bicycle or motorcycle accidents, sports injuries, and falls. °HOW ARE HEAD INJURIES DIAGNOSED? °A complete history of the event leading to the injury and your current symptoms will be helpful in diagnosing head injuries. Many times, pictures of the brain, such as CT or MRI are needed to see the extent of the injury. Often, an overnight hospital stay is necessary for observation.  °WHEN SHOULD I SEEK IMMEDIATE MEDICAL CARE?  °You should get help right away if: °· You have confusion or drowsiness. °· You feel sick to your stomach (nauseous) or have continued, forceful vomiting. °· You have dizziness or unsteadiness that is getting worse. °· You have severe, continued headaches not relieved by  medicine. Only take over-the-counter or prescription medicines for pain, fever, or discomfort as directed by your health care provider. °· You do not have normal function of the arms or legs or are unable to walk. °· You notice changes in the black spots in the center of the colored part of your eye (pupil). °· You have a clear or bloody fluid coming from your nose or ears. °· You have a loss of vision. °During the next 24 hours after the injury, you must stay with someone who can watch you for the warning signs. This person should contact local emergency services (911 in the [place] if you have seizures, you become unconscious, or you are unable to wake up. °HOW CAN I PREVENT A HEAD INJURY IN THE FUTURE? °The most important factor for preventing major head injuries is avoiding motor vehicle accidents.  To minimize the potential for damage to your head, it is crucial to wear seat belts while riding in motor vehicles. Wearing helmets while bike riding and playing collision sports (like football) is also helpful. Also, avoiding dangerous activities around the house will further help reduce your risk of head injury.  °WHEN CAN I RETURN TO NORMAL ACTIVITIES AND ATHLETICS? °You should be reevaluated by your health care provider before returning to these activities. If you have any of the following symptoms, you should not return to activities or contact sports until 1 week after the symptoms have stopped: °· Persistent headache. °· Dizziness or vertigo. °· Poor attention and concentration. °· Confusion. °·   Memory problems.  Nausea or vomiting.  Fatigue or tire easily.  Irritability.  Intolerant of bright lights or loud noises.  Anxiety or depression.  Disturbed sleep. MAKE SURE YOU:   Understand these instructions.  Will watch your condition.  Will get help right away if you are not doing well or get worse. Document Released: 02/19/2005 Document Revised: 02/24/2013 Document Reviewed:  10/27/2012 [facility] Patient Information 2015 [place] [place]. This information is not intended to replace advice given to you by your health care provider. Make sure you discuss any questions you have with your health care provider.  Confusion Confusion is the inability to think with your usual speed or clarity. Confusion may come on quickly or slowly over time. How quickly the confusion comes on depends on the cause. Confusion can be due to any number of causes. CAUSES   Concussion, head injury, or head trauma.  Seizures.  Stroke.  Fever.  Brain tumor.  Age related decreased brain function (dementia).  Heightened emotional states like rage or terror.  Mental illness in which the person loses the ability to determine what is real and what is not (hallucinations).  Infections such as a urinary tract infection (UTI).  Toxic effects from alcohol, drugs, or prescription medicines.  Dehydration and an imbalance of salts in the body (electrolytes).  Lack of sleep.  Low blood sugar (diabetes).  Low levels of oxygen from conditions such as chronic lung disorders.  Drug interactions or other medicine side effects.  Nutritional deficiencies, especially niacin, thiamine, vitamin C, or vitamin B.  Sudden drop in body temperature (hypothermia).  Change in routine, such as when traveling or hospitalized. SIGNS AND SYMPTOMS  People often describe their thinking as cloudy or unclear when they are confused. Confusion can also include feeling disoriented. That means you are unaware of where or who you are. You may also not know what the date or time is. If confused, you may also have difficulty paying attention, remembering, and making decisions. Some people also act aggressively when they are confused.  DIAGNOSIS  The medical evaluation of confusion may include:  Blood and urine tests.  X-rays.  Brain and nervous system tests.  Analyzing your brain waves (electroencephalogram or  EEG).  Magnetic resonance imaging (MRI) of your head.  Computed tomography (CT) scan of your head.  Mental status tests in which your health care provider may ask many questions. Some of these questions may seem silly or strange, but they are a very important test to help diagnose and treat confusion. TREATMENT  An admission to the hospital may not be needed, but a person with confusion should not be left alone. Stay with a family member or friend until the confusion clears. Avoid alcohol, pain relievers, or sedative drugs until you have fully recovered. Do not drive until directed by your health care provider. HOME CARE INSTRUCTIONS  What family and friends can do:  To find out if someone is confused, ask the person to state his or her name, age, and the date. If the person is unsure or answers incorrectly, he or she is confused.  Always introduce yourself, no matter how well the person knows you.  Often remind the person of his or her location.  Place a calendar and clock near the confused person.  Help the person with his or her medicines. You may want to use a pill box, an alarm as a reminder, or give the person each dose as prescribed.  Talk about current events and plans  for the day.  Try to keep the environment calm, quiet, and peaceful.  Make sure the person keeps follow-up visits with his or her health care provider. PREVENTION  Ways to prevent confusion:  Avoid alcohol.  Eat a balanced diet.  Get enough sleep.  Take medicine only as directed by your health care provider.  Do not become isolated. Spend time with other people and make plans for your days.  Keep careful watch on your blood sugar levels if you are diabetic. SEEK IMMEDIATE MEDICAL CARE IF:   You develop severe headaches, repeated vomiting, seizures, blackouts, or slurred speech.  There is increasing confusion, weakness, numbness, restlessness, or personality changes.  You develop a loss of balance,  have marked dizziness, feel uncoordinated, or fall.  You have delusions, hallucinations, or develop severe anxiety.  Your family members think you need to be rechecked. Document Released: 03/29/2004 Document Revised: 07/06/2013 Document Reviewed: 03/27/2013 [facility] Patient Information 2015 [place], [place]. This information is not intended to replace advice given to you by your health care provider. Make sure you discuss any questions you have with your health care provider.  Contusion A contusion is a deep bruise. Contusions are the result of an injury that caused bleeding under the skin. The contusion may turn blue, purple, or yellow. Minor injuries will give you a painless contusion, but more severe contusions may stay painful and swollen for a few weeks.  CAUSES  A contusion is usually caused by a blow, trauma, or direct force to an area of the body. SYMPTOMS   Swelling and redness of the injured area.  Bruising of the injured area.  Tenderness and soreness of the injured area.  Pain. DIAGNOSIS  The diagnosis can be made by taking a history and physical exam. An X-ray, CT scan, or MRI may be needed to determine if there were any associated injuries, such as fractures. TREATMENT  Specific treatment will depend on what area of the body was injured. In general, the best treatment for a contusion is resting, icing, elevating, and applying cold compresses to the injured area. Over-the-counter medicines may also be recommended for pain control. Ask your caregiver what the best treatment is for your contusion. HOME CARE INSTRUCTIONS   Put ice on the injured area.  Put ice in a plastic bag.  Place a towel between your skin and the bag.  Leave the ice on for 15-20 minutes, 3-4 times a day, or as directed by your health care provider.  Only take over-the-counter or prescription medicines for pain, discomfort, or fever as directed by your caregiver. Your caregiver may recommend avoiding  anti-inflammatory medicines (aspirin, ibuprofen, and naproxen) for 48 hours because these medicines may increase bruising.  Rest the injured area.  If possible, elevate the injured area to reduce swelling. SEEK IMMEDIATE MEDICAL CARE IF:   You have increased bruising or swelling.  You have pain that is getting worse.  Your swelling or pain is not relieved with medicines. MAKE SURE YOU:   Understand these instructions.  Will watch your condition.  Will get help right away if you are not doing well or get worse. Document Released: 11/29/2004 Document Revised: 02/24/2013 Document Reviewed: 12/25/2010 [facility] Patient Information 2015 [place], [place]. This information is not intended to replace advice given to you by your health care provider. Make sure you discuss any questions you have with your health care provider.

## 2013-10-02 NOTE — ED Notes (Signed)
Caregiver contact: Thayer Ohmhris [phone]

## 2013-10-02 NOTE — ED Notes (Signed)
Pt in from home c/o frequent urination and intermittent diarrhea over the last two weeks, pt fell two days ago and hit her head, denies LOC, since that time family has noted patient hallucinating at times, pt alert to person and place, disoriented to time, pt alert

## 2013-10-02 NOTE — ED Provider Notes (Signed)
CSN: [id number]     Arrival date & time 10/02/13  1504 History   First MD Initiated Contact with Patient 10/02/13 1548     Chief Complaint  Patient presents with  . Urinary Frequency  . Diarrhea  . Fall     (Consider location/radiation/quality/duration/timing/severity/associated sxs/prior Treatment) The history is provided by the patient and a relative. No language interpreter was used.  Level 5 Caveat  [name] is a 80 year old female with past medical history stroke in March 2015 the left her with left-sided weakness, hypertension, diabetes, high cholesterol, gout, coronary artery disease with cardiac catheterization, vertigo, chronic kidney disease presenting to the ED with altered mental status and fall. As per daughter, reported that patient fell approximately 2 days ago when she rolled off the bed. Daughter reported that she was found laying flat on the floor, face down. Reported that patient has black and blue localized to the left side of her orbits. Stated that shortly after the fall patient had slurred speech and increased weakness the left side of her arm. As per daughter, reported that patient started to have hallucinations that started after midnight yesterday - as per daughter, reported that the hallucinations is that patient is seeing people outside of her window and is talking to the midbrain times. Reported that patient is unaware she is she is. As per daughter, reported that patient's been wheezing but this is a chronic issue. Daughter reports that patient is living with her ever since she has been discharged from rehabilitation. Reported that patient is been having increased urination for the past couple of days. As per daughter denied complaints of chest pain, shortness of breath, difficulty breathing, nasal congestion, abdominal pain, changes to eating habits.  Past Medical History  Diagnosis Date  . UTI (urinary tract infection)   . Hypertension   . Diabetes mellitus    . Gout   . High cholesterol   . Complication of anesthesia   . Diabetic neuropathy   . PVD (peripheral vascular disease)   . Carotid artery occlusion   . Arthritis   . Chronic kidney disease   . GERD (gastroesophageal reflux disease)   . Vertigo   . Venous insufficiency   . Mood disorder   . Dysphagia    Past Surgical History  Procedure Laterality Date  . Hemorrhoid banding    . Abdominal hysterectomy    . Cardiac catheterization     Family History  Problem Relation Age of Onset  . Heart disease Daughter   . Hypertension Daughter   . Diabetes Son   . Heart disease Son    History  Substance Use Topics  . Smoking status: Never Smoker   . Smokeless tobacco: Never Used  . Alcohol Use: No   OB History   Grav Para Term Preterm Abortions TAB SAB Ect Mult Living                 Review of Systems  Unable to perform ROS: Mental status change      Allergies  Review of patient's allergies indicates no known allergies.  Home Medications   Prior to Admission medications   Medication Sig Start Date End Date Taking? Authorizing Provider  allopurinol (ZYLOPRIM) 100 MG tablet Take 100 mg by mouth 2 (two) times daily.   Yes Historical Provider, MD  amLODipine (NORVASC) 10 MG tablet Take 1 tablet (10 mg total) by mouth daily. 11/26/11  Yes Catarina Hartshorn, MD  aspirin EC 81 MG EC tablet Take 1  tablet (81 mg total) by mouth daily. 11/26/11  Yes Catarina Hartshorn, MD  atorvastatin (LIPITOR) 20 MG tablet Take 20 mg by mouth at bedtime.   Yes Historical Provider, MD  cetirizine (ZYRTEC) 10 MG tablet Take 10 mg by mouth daily.   Yes Historical Provider, MD  donepezil (ARICEPT) 10 MG tablet Take 10 mg by mouth at bedtime.    Yes Historical Provider, MD  gabapentin (NEURONTIN) 300 MG capsule Take 300 mg by mouth 3 (three) times daily.    Yes Historical Provider, MD  glipiZIDE (GLUCOTROL) 5 MG tablet Take 5 mg by mouth 2 (two) times daily before a meal.   Yes Historical Provider, MD    HYDROcodone-acetaminophen (NORCO/VICODIN) 5-325 MG per tablet Take 1 tablet by mouth every 6 (six) hours as needed for moderate pain. 06/03/13  Yes Claudie Revering, NP  Insulin Lispro Prot & Lispro (HUMALOG MIX 75/25 KWIKPEN) (75-25) 100 UNIT/ML SUPN Inject 20-33 Units into the skin 2 (two) times daily. 33 units in am, 20 units in pm   Yes Historical Provider, MD  Wheat Dextrin (BENEFIBER DRINK MIX) PACK Take 1 packet by mouth 2 (two) times daily.    Yes Historical Provider, MD  insulin aspart (NOVOLOG) 100 UNIT/ML injection Inject 0-9 Units into the skin every 4 (four) hours. 06/02/13   Alison Murray, MD   BP 144/84  Pulse 71  Temp(Src) 97.8 F (36.6 C) (Oral)  Resp 20  SpO2 96% Physical Exam  Nursing note and vitals reviewed. Constitutional: She appears well-developed and well-nourished. No distress.  HENT:  Head: Normocephalic.    Mouth/Throat: Oropharynx is clear and moist. No oropharyngeal exudate.  Negative trismus noted  Negative damage noted to dentition  Eyes: Conjunctivae and EOM are normal. Pupils are equal, round, and reactive to light. Right eye exhibits no discharge. Left eye exhibits no discharge.  Mild swelling identified to left eye-orbit with ecchymosis noted Negative damage noted to the sclera-negative injection EOMs intact with negative difficulty noted  Neck: Normal range of motion. Neck supple. No tracheal deviation present.  Negative neck stiffness Negative nuchal rigidity Cervical lymphadenopathy Negative meningeal signs Negative pain upon palpation to the C-spine Negative pain upon palpation to musculature of the neck bilaterally  Cardiovascular: Normal rate, regular rhythm and normal heart sounds.  Exam reveals no friction rub.   No murmur heard. Pulses:      Radial pulses are 2+ on the right side, and 2+ on the left side.       Dorsalis pedis pulses are 2+ on the right side, and 2+ on the left side.  Cap refill less than 3 seconds Negative swelling or  pitting edema identified to the lower tremors bilaterally  Pulmonary/Chest: Effort normal and breath sounds normal. No respiratory distress. She has no wheezes. She has no rales. She exhibits no tenderness.  Patient stable to speak in full sentences without difficulty Negative use of accessory muscles Negative pain upon palpation to the chest wall  Negative stridor Negative crepitus upon palpation to the chest wall Negative ecchymosis noted to the chest wall    Abdominal: Soft. Bowel sounds are normal. She exhibits no distension. There is no tenderness. There is no rebound and no guarding.  Negative abdominal distention Bowel sounds normal active in all 4 quadrants Negative pain upon palpation to the abdomen Negative rigidity Negative peritoneal signs Negative guarding noted upon exam Abdomen soft  Musculoskeletal: She exhibits no edema and no tenderness.  Decreased range of motion to the left  upper and lower extremities bilaterally secondary to deficits from stroke in March 2015  Lymphadenopathy:    She has no cervical adenopathy.  Neurological: She is alert. No cranial nerve deficit. She exhibits normal muscle tone. Coordination normal.  Cranial nerves III-XII grossly intact Strength 5+/5+ to right upper and right lower extremities bilaterally with resistance applied, equal distribution noted Strength 2+/5+ to left upper and lower tremors bilaterally with resistance applied - chronic secondary to deficit or stroke in March 2015 Right-sided grip strength stronger and left-sided grip strength Mild garbled speech noted  Mild facial droop to the left side Patient follows commands GCS 15  Skin: Skin is warm and dry. No rash noted. She is not diaphoretic. No erythema.  Psychiatric: She has a normal mood and affect. Her behavior is normal. Thought content normal.    ED Course  Procedures (including critical care time)  6:33 PM This provider spoke with Neurology, Dr. Adline Peals -  discussed case, labs, imaging in great detail. Neurology to come and assess.   Results for orders placed during the hospital encounter of 10/02/13  CBC WITH DIFFERENTIAL      Result Value Ref Range   WBC 8.0  4.0 - 10.5 K/uL   RBC 4.51  3.87 - 5.11 MIL/uL   Hemoglobin 12.5  12.0 - 15.0 g/dL   HCT 16.1  09.6 - 04.5 %   MCV 87.6  78.0 - 100.0 fL   MCH 27.7  26.0 - 34.0 pg   MCHC 31.6  30.0 - 36.0 g/dL   RDW 40.9 (*) 81.1 - 91.4 %   Platelets 246  150 - 400 K/uL   Neutrophils Relative % 58  43 - 77 %   Neutro Abs 4.6  1.7 - 7.7 K/uL   Lymphocytes Relative 33  12 - 46 %   Lymphs Abs 2.7  0.7 - 4.0 K/uL   Monocytes Relative 5  3 - 12 %   Monocytes Absolute 0.4  0.1 - 1.0 K/uL   Eosinophils Relative 4  0 - 5 %   Eosinophils Absolute 0.3  0.0 - 0.7 K/uL   Basophils Relative 0  0 - 1 %   Basophils Absolute 0.0  0.0 - 0.1 K/uL  COMPREHENSIVE METABOLIC PANEL      Result Value Ref Range   Sodium 144  137 - 147 mEq/L   Potassium 3.9  3.7 - 5.3 mEq/L   Chloride 100  96 - 112 mEq/L   CO2 31  19 - 32 mEq/L   Glucose, Bld 107 (*) 70 - 99 mg/dL   BUN 17  6 - 23 mg/dL   Creatinine, Ser 7.82  0.50 - 1.10 mg/dL   Calcium 9.6  8.4 - 95.6 mg/dL   Total Protein 8.3  6.0 - 8.3 g/dL   Albumin 3.6  3.5 - 5.2 g/dL   AST 24  0 - 37 U/L   ALT 13  0 - 35 U/L   Alkaline Phosphatase 106  39 - 117 U/L   Total Bilirubin 0.2 (*) 0.3 - 1.2 mg/dL   GFR calc non Af Amer 48 (*) >90 mL/min   GFR calc Af Amer 56 (*) >90 mL/min   Anion gap 13  5 - 15  URINALYSIS, ROUTINE W REFLEX MICROSCOPIC      Result Value Ref Range   Color, Urine YELLOW  YELLOW   APPearance CLEAR  CLEAR   Specific Gravity, Urine 1.020  1.005 - 1.030   pH 6.5  5.0 - 8.0   Glucose, UA NEGATIVE  NEGATIVE mg/dL   Hgb urine dipstick NEGATIVE  NEGATIVE   Bilirubin Urine NEGATIVE  NEGATIVE   Ketones, ur NEGATIVE  NEGATIVE mg/dL   Protein, ur NEGATIVE  NEGATIVE mg/dL   Urobilinogen, UA 0.2  0.0 - 1.0 mg/dL   Nitrite NEGATIVE  NEGATIVE    Leukocytes, UA NEGATIVE  NEGATIVE  LIPASE, BLOOD      Result Value Ref Range   Lipase 110 (*) 11 - 59 U/L  TROPONIN I      Result Value Ref Range   Troponin I <0.30  <0.30 ng/mL  I-STAT CG4 LACTIC ACID, ED      Result Value Ref Range   Lactic Acid, Venous 1.25  0.5 - 2.2 mmol/L    Labs Review Labs Reviewed  CBC WITH DIFFERENTIAL - Abnormal; Notable for the following:    RDW 17.0 (*)    All other components within normal limits  COMPREHENSIVE METABOLIC PANEL - Abnormal; Notable for the following:    Glucose, Bld 107 (*)    Total Bilirubin 0.2 (*)    GFR calc non Af Amer 48 (*)    GFR calc Af Amer 56 (*)    All other components within normal limits  LIPASE, BLOOD - Abnormal; Notable for the following:    Lipase 110 (*)    All other components within normal limits  URINALYSIS, ROUTINE W REFLEX MICROSCOPIC  TROPONIN I  I-STAT CG4 LACTIC ACID, ED    Imaging Review Dg Chest 2 View  10/02/2013   CLINICAL DATA:  Recent injury  EXAM: CHEST  2 VIEW  COMPARISON:  05/30/2013  FINDINGS: Cardiac shadow is enlarged but stable. A hiatal hernia is again seen. Mild vascular congestion is noted as well as bibasilar atelectasis. No acute bony abnormality is seen. Degenerative changes about the shoulder joints are noted.  IMPRESSION: Bibasilar atelectasis.  Hiatal hernia.  Mild vascular congestion.   Electronically Signed   By: Alcide Clever M.D.   On: 10/02/2013 17:58   Ct Head Wo Contrast  10/02/2013   CLINICAL DATA:  Recent traumatic injury with recent hallucination.  EXAM: CT HEAD WITHOUT CONTRAST  CT MAXILLOFACIAL WITHOUT CONTRAST  CT CERVICAL SPINE WITHOUT CONTRAST  TECHNIQUE: Multidetector CT imaging of the head, cervical spine, and maxillofacial structures were performed using the standard protocol without intravenous contrast. Multiplanar CT image reconstructions of the cervical spine and maxillofacial structures were also generated.  COMPARISON:  05/31/2013  FINDINGS: CT HEAD FINDINGS  The  bony calvarium is intact. Heavy calcifications of the carotid and basilar artery are seen. Diffuse atrophic changes and chronic white matter ischemic change is seen. Pelvic coronary infarct is noted adjacent to the right lateral ventricle in its midportion. No findings to suggest acute hemorrhage, acute infarction or space-occupying mass lesion are noted.  CT MAXILLOFACIAL FINDINGS  The orbits and their contents are within normal limits. The facial soft tissues are unremarkable. No acute bony abnormality is seen. Mucosal thickening is noted within the maxillary antra bilaterally which appears to be chronic. The ostiomeatal complexes are occluded bilaterally.  CT CERVICAL SPINE FINDINGS  Seven cervical segments are well visualized. Vertebral body height is well maintained. Multilevel osteophytic changes are seen as well as significant disc space narrowing at C5-6 and C6-7. Facet hypertrophic changes are seen. No acute fracture or acute facet abnormality is noted. The surrounding soft tissue structures show prominence of the left lobe of the thyroid of uncertain significance. Diffuse carotid calcifications  are seen.  IMPRESSION: CT of the head: Chronic changes without acute abnormality.  CT of the maxillofacial bones: No acute bony abnormality is noted. Chronic mucosal thickening is noted within the maxillary antra bilaterally.  CT of the cervical spine: Degenerative changes without acute abnormality.  Enlarged left lobe of the thyroid of uncertain significance. Clinical correlation is recommended.   Electronically Signed   By: Alcide CleverMark  Lukens M.D.   On: 10/02/2013 17:45   Ct Cervical Spine Wo Contrast  10/02/2013   CLINICAL DATA:  Recent traumatic injury with recent hallucination.  EXAM: CT HEAD WITHOUT CONTRAST  CT MAXILLOFACIAL WITHOUT CONTRAST  CT CERVICAL SPINE WITHOUT CONTRAST  TECHNIQUE: Multidetector CT imaging of the head, cervical spine, and maxillofacial structures were performed using the standard protocol  without intravenous contrast. Multiplanar CT image reconstructions of the cervical spine and maxillofacial structures were also generated.  COMPARISON:  05/31/2013  FINDINGS: CT HEAD FINDINGS  The bony calvarium is intact. Heavy calcifications of the carotid and basilar artery are seen. Diffuse atrophic changes and chronic white matter ischemic change is seen. Pelvic coronary infarct is noted adjacent to the right lateral ventricle in its midportion. No findings to suggest acute hemorrhage, acute infarction or space-occupying mass lesion are noted.  CT MAXILLOFACIAL FINDINGS  The orbits and their contents are within normal limits. The facial soft tissues are unremarkable. No acute bony abnormality is seen. Mucosal thickening is noted within the maxillary antra bilaterally which appears to be chronic. The ostiomeatal complexes are occluded bilaterally.  CT CERVICAL SPINE FINDINGS  Seven cervical segments are well visualized. Vertebral body height is well maintained. Multilevel osteophytic changes are seen as well as significant disc space narrowing at C5-6 and C6-7. Facet hypertrophic changes are seen. No acute fracture or acute facet abnormality is noted. The surrounding soft tissue structures show prominence of the left lobe of the thyroid of uncertain significance. Diffuse carotid calcifications are seen.  IMPRESSION: CT of the head: Chronic changes without acute abnormality.  CT of the maxillofacial bones: No acute bony abnormality is noted. Chronic mucosal thickening is noted within the maxillary antra bilaterally.  CT of the cervical spine: Degenerative changes without acute abnormality.  Enlarged left lobe of the thyroid of uncertain significance. Clinical correlation is recommended.   Electronically Signed   By: Alcide CleverMark  Lukens M.D.   On: 10/02/2013 17:45   Ct Maxillofacial Wo Cm  10/02/2013   CLINICAL DATA:  Recent traumatic injury with recent hallucination.  EXAM: CT HEAD WITHOUT CONTRAST  CT MAXILLOFACIAL  WITHOUT CONTRAST  CT CERVICAL SPINE WITHOUT CONTRAST  TECHNIQUE: Multidetector CT imaging of the head, cervical spine, and maxillofacial structures were performed using the standard protocol without intravenous contrast. Multiplanar CT image reconstructions of the cervical spine and maxillofacial structures were also generated.  COMPARISON:  05/31/2013  FINDINGS: CT HEAD FINDINGS  The bony calvarium is intact. Heavy calcifications of the carotid and basilar artery are seen. Diffuse atrophic changes and chronic white matter ischemic change is seen. Pelvic coronary infarct is noted adjacent to the right lateral ventricle in its midportion. No findings to suggest acute hemorrhage, acute infarction or space-occupying mass lesion are noted.  CT MAXILLOFACIAL FINDINGS  The orbits and their contents are within normal limits. The facial soft tissues are unremarkable. No acute bony abnormality is seen. Mucosal thickening is noted within the maxillary antra bilaterally which appears to be chronic. The ostiomeatal complexes are occluded bilaterally.  CT CERVICAL SPINE FINDINGS  Seven cervical segments are well visualized. Vertebral  body height is well maintained. Multilevel osteophytic changes are seen as well as significant disc space narrowing at C5-6 and C6-7. Facet hypertrophic changes are seen. No acute fracture or acute facet abnormality is noted. The surrounding soft tissue structures show prominence of the left lobe of the thyroid of uncertain significance. Diffuse carotid calcifications are seen.  IMPRESSION: CT of the head: Chronic changes without acute abnormality.  CT of the maxillofacial bones: No acute bony abnormality is noted. Chronic mucosal thickening is noted within the maxillary antra bilaterally.  CT of the cervical spine: Degenerative changes without acute abnormality.  Enlarged left lobe of the thyroid of uncertain significance. Clinical correlation is recommended.   Electronically Signed   By: Alcide Clever M.D.   On: 10/02/2013 17:45     EKG Interpretation   Date/Time:  Friday October 02 2013 16:42:57 EDT Ventricular Rate:  70 PR Interval:  220 QRS Duration: 86 QT Interval:  519 QTC Calculation: 560 R Axis:   -64 Text Interpretation:  Sinus rhythm Prolonged PR interval Left anterior  fascicular block Nonspecific T abnrm, anterolateral leads Minimal ST  elevation, lateral leads Prolonged QT interval Baseline wander in lead(s)  V6 Since last tracing PR interval and QT have lengthened Abnormal ECG  Confirmed by Effie Shy  MD, Mechele Collin ([id number]) on 10/02/2013 4:55:28 PM      MDM   Final diagnoses:  Head injury, initial encounter  Contusion of face, initial encounter  Transient confusion    Medications  sodium chloride 0.9 % bolus 500 mL (500 mLs Intravenous New Bag/Given 10/02/13 1756)   Filed Vitals:   10/02/13 1523 10/02/13 1605 10/02/13 1756  BP: 131/63 150/79 144/84  Pulse: 71    Temp: 97.8 F (36.6 C)    TempSrc: Oral    Resp: 18 18 20   SpO2: 93% 99% 96%   This provider reviewed patient's chart. Patient seen and assessed in ED setting in March 2015 regarding left-sided weakness since diagnosed with a stroke. 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 Patient was then placed in rehabilitation. EKG noted normal sinus rhythm with a heart rate of 70 beats per minute with a prolonged PR interval with left anterior fascicular block-nonspecific T. abnormality, minimal ST elevation. Prolonged QT interval. Troponin negative elevation. Lactic acid negative elevation. Lipase elevated at 110. CBC negative elevated white blood cell count-negative left shift or leukocytosis. Hemoglobin 12.5, hematocrit 39.5. CMP negative findings-BUN 17, creatinine 0.98. Negative elevated AST, ALT, alkaline phosphatase of bilirubin. Glucose 107. Urinalysis negative for hemoglobin, nitrites,  leukocytes. CT head without contrast noted chronic changes without acute abnormalities. CT cervical spine degenerative changes without acute abnormalities. CT maxillofacial no bony abnormalities noted, chronic nuchal thickening is noted within the maxillary antra bilaterally. Chest x-ray identified bilateral basilar atelectasis with a hiatal hernia. Mild vascular congestion noted. Doubt UTI. Doubt pyelonephritis. Negative findings of acute infectious processes. Negative acute injuries noted on imaging.  6:45 PM Patient was seen and assessed by attending physician, Dr. Bernell List. Patient was discharged by Dr. Bernell List. This provider wanted to admit the patient secondary to altered mental status and slurred speech with strong history of neurological issues, as well as history of recent stroke in March of 2015, attending physician did not agree.  6:51 PM This provider spoke with Dr. Adline Peals who agreed with discharged of the patient - believes to be  transient confusion secondary to fall.   Raymon Mutton, PA-C 10/03/13 0240  Raymon Mutton, PA-C 10/03/13 [phone]

## 2013-10-02 NOTE — ED Provider Notes (Signed)
  Face-to-face evaluation   History: Patient here for evaluation of delirium following fall from bed. The fall was 2 days ago. Today, a home health nurse recommended that she be evaluated, for the fall. Earlier this morning, the patient was seeing things that were not there and was "disoriented". The symptoms have resolved. She was discharged, from rehabilitation 4 weeks ago after a stroke about 2 months ago. She's been doing well at home. She has an appointment with her PCP, for next week. There are been no recent illnesses.  Physical exam: Alert, elderly, female, who is lucid, communicative, and appears comfortable. Heart regular rate and rhythm. No murmur. Lungs, good air movement, with a few scattered wheezes. Neurologic- normal sensation arms, and legs, bilaterally. Able to sit and support self for exam. Speaks with an accent. No dysarthria, aphasia or nystagmus. A&O X 3. Intact remote and current memory.   Assessment- transient confusion this morning. Mild head injury without significant signs for intracranial injury, or fracture. No apparent metabolic instability, serious bacterial infection or suspected impending vascular collapse.  Plan: Discharge home to continue her usual treatments. See primary care provider, next week, as scheduled.  Medical screening examination/treatment/procedure(s) were conducted as a shared visit with non-physician practitioner(s) and myself.  I personally evaluated the patient during the encounter  Flint MelterElliott L Trisha Ken, MD 10/04/13 [phone]

## 2013-10-11 ENCOUNTER — Other Ambulatory Visit: Payer: Self-pay | Admitting: Internal Medicine

## 2013-10-19 ENCOUNTER — Other Ambulatory Visit: Payer: Self-pay | Admitting: Internal Medicine

## 2013-10-25 ENCOUNTER — Other Ambulatory Visit: Payer: Self-pay | Admitting: Internal Medicine

## 2013-11-05 ENCOUNTER — Other Ambulatory Visit: Payer: Self-pay | Admitting: Internal Medicine

## 2013-11-29 ENCOUNTER — Other Ambulatory Visit: Payer: Self-pay | Admitting: Internal Medicine

## 2013-12-08 ENCOUNTER — Encounter: Payer: Self-pay | Admitting: *Deleted

## 2013-12-09 ENCOUNTER — Ambulatory Visit (INDEPENDENT_AMBULATORY_CARE_PROVIDER_SITE_OTHER): Payer: Medicare Other | Admitting: Neurology

## 2013-12-09 ENCOUNTER — Encounter (INDEPENDENT_AMBULATORY_CARE_PROVIDER_SITE_OTHER): Payer: Self-pay

## 2013-12-09 ENCOUNTER — Encounter: Payer: Self-pay | Admitting: Neurology

## 2013-12-09 VITALS — BP 132/100 | HR 60 | Ht 64.0 in | Wt 142.0 lb

## 2013-12-09 DIAGNOSIS — I69398 Other sequelae of cerebral infarction: Secondary | ICD-10-CM

## 2013-12-09 DIAGNOSIS — I69359 Hemiplegia and hemiparesis following cerebral infarction affecting unspecified side: Secondary | ICD-10-CM | POA: Insufficient documentation

## 2013-12-09 DIAGNOSIS — I779 Disorder of arteries and arterioles, unspecified: Secondary | ICD-10-CM

## 2013-12-09 HISTORY — DX: Hemiplegia and hemiparesis following cerebral infarction affecting unspecified side: I69.359

## 2013-12-09 NOTE — Progress Notes (Signed)
Reason for visit: Stroke, depression  [name] is a 80 y.o. female  History of present illness:  [name] is a 80 year old right-handed black female with a history of cerebrovascular disease. The patient sustained a stroke in late March of 2015 associated with a left hemiparesis. The patient has had limitation in her ability to ambulate since that time, and she went from living independently to living with her daughter. The patient has undergone physical therapy, with the last therapy 6-8 weeks ago. The patient has not had any falls at home. The patient has had a memory deficit that was present prior to the stroke, and the memory has not changed much since the stroke. MRI evaluation of the brain in March of 2015 revealed evidence of a centrum semiovale infarct on the right. The patient also has a basilar aneurysm of 7 mm. The patient was not felt to be a candidate for ablation of the aneurysm. The patient is on low-dose aspirin which she was on prior to the stroke. The patient has peripheral vascular disease as well. The patient denies any headaches or vision changes, or new numbness or weakness since the stroke. She has developed some problems with depression over the last 3 months associated with crying episodes, and withdrawal from social activities. The patient seems to be sleeping fairly well. Apparently, the patient does not get out of the house much, and the daughter indicates that she does not have time to take the patient out to a restaurant or go to the park. They are sent to this office for an evaluation. The patient is on an antidepressant medication, and this medication dosing was increased recently.   Past Medical History  Diagnosis Date  . UTI (urinary tract infection)   . Hypertension   . Diabetes mellitus   . Gout   . High cholesterol   . Complication of anesthesia   . Diabetic neuropathy   . PVD (peripheral vascular disease)   . Carotid artery occlusion   .  Arthritis   . Chronic kidney disease   . GERD (gastroesophageal reflux disease)   . Vertigo   . Venous insufficiency   . Mood disorder   . Dysphagia   . Hemiparesis and alteration of sensations as late effects of stroke 12/09/2013    Past Surgical History  Procedure Laterality Date  . Hemorrhoid banding    . Abdominal hysterectomy    . Cardiac catheterization      Family History  Problem Relation Age of Onset  . Heart disease Daughter   . Hypertension Daughter   . Diabetes Son   . Heart disease Son   . Diabetes Father   . Diabetes Sister   . Diabetes Sister     Social history:  reports that she has never smoked. She has never used smokeless tobacco. She reports that she does not drink alcohol or use illicit drugs.  Medications:  Current Outpatient Prescriptions on File Prior to Visit  Medication Sig Dispense Refill  . allopurinol (ZYLOPRIM) 100 MG tablet Take 100 mg by mouth 2 (two) times daily.      Marland Kitchen amLODipine (NORVASC) 10 MG tablet Take 1 tablet (10 mg total) by mouth daily.  30 tablet  3  . aspirin EC 81 MG EC tablet Take 1 tablet (81 mg total) by mouth daily.      Marland Kitchen atorvastatin (LIPITOR) 20 MG tablet Take 20 mg by mouth at bedtime.      . cetirizine (ZYRTEC) 10  MG tablet Take 10 mg by mouth daily.      Marland Kitchen donepezil (ARICEPT) 10 MG tablet Take 10 mg by mouth at bedtime.       . gabapentin (NEURONTIN) 300 MG capsule Take 300 mg by mouth 3 (three) times daily.       Marland Kitchen glipiZIDE (GLUCOTROL) 5 MG tablet TAKE 1 TABLET BY MOUTH TWICE DAILY  60 tablet  2  . HYDROcodone-acetaminophen (NORCO/VICODIN) 5-325 MG per tablet Take 1 tablet by mouth every 6 (six) hours as needed for moderate pain.  120 tablet  0  . insulin aspart (NOVOLOG) 100 UNIT/ML injection Inject 0-9 Units into the skin every 4 (four) hours.  10 mL  11  . Insulin Lispro Prot & Lispro (HUMALOG MIX 75/25 KWIKPEN) (75-25) 100 UNIT/ML SUPN Inject 20-33 Units into the skin 2 (two) times daily. 33 units in am, 20 units  in pm       No current facility-administered medications on file prior to visit.     No Known Allergies  ROS:  Out of a complete 14 system review of symptoms, the patient complains only of the following symptoms, and all other reviewed systems are negative.  Weight gain  Swelling in the legs Moles Cough, wheezing Memory loss Depression  Blood pressure 132/100, pulse 60, height 5\' 4"  (1.626 m), weight 142 lb (64.411 kg).  Physical Exam  General: The patient is alert and cooperative at the time of the examination.  Eyes: Pupils are equal, round, and reactive to light. Discs are flat bilaterally.  Neck: The neck is supple, no carotid bruits are noted.  Respiratory: The respiratory examination is clear.  Cardiovascular: The cardiovascular examination reveals a regular rate and rhythm, no obvious murmurs or rubs are noted.  Skin: Extremities are with 2+ edema below the knees bilaterally.   Neurologic Exam  Mental status: The Mini-Mental status examination done today shows a total score of 9/30.   Cranial nerves: Facial symmetry is present. There is good sensation of the face to pinprick and soft touch bilaterally. The strength of the facial muscles and the muscles to head turning and shoulder shrug are normal bilaterally. Speech is well enunciated, no aphasia or dysarthria is noted. Extraocular movements are full. Visual fields are full. The tongue is midline, and the patient has symmetric elevation of the soft palate. No obvious hearing deficits are noted.  Motor: The motor testing reveals 5 over 5 strength of the right  extremities. The patient has minimal grip with the left arm, 4/5 strength with flexion and extension at the elbow, but the patient is unable to elevate the arm. With the left lower extremity, the patient has near-normal strength throughout, but some increase in motor tone. Good symmetric motor tone is noted throughout.  Sensory: Sensory testing is intact to  pinprick, soft touch, vibration sensation, and position sense on all 4 extremities, with exception that vibration sensation is decreased on the left foot. No evidence of extinction is noted.  Coordination: Cerebellar testing reveals good finger-nose-finger and heel-to-shin on the right, the patient has ataxia with heel shin with the left leg, unable to perform finger-nose-finger with the left arm.  Gait and station: the patient is able to stand with assistance. She can walk short distances with assistance, gait is somewhat wide-based, circumduction gait with the left leg. Gait is unstable.   Reflexes: Deep tendon reflexes are symmetric, but are depressed bilaterally. Toes are neutral  bilaterally.   CT head and cervical 10/02/13:  IMPRESSION:  CT of the head: Chronic changes without acute abnormality.  CT of the maxillofacial bones: No acute bony abnormality is noted.  Chronic mucosal thickening is noted within the maxillary antra  bilaterally.  CT of the cervical spine: Degenerative changes without acute  abnormality.  Enlarged left lobe of the thyroid of uncertain significance.  Clinical correlation is recommended.   MRI brain and MRA head 05/31/13:  IMPRESSION:  1. Acute lacunar type infarct extending from the right corona  radiata through the right lentiform nuclei to the right external  capsule. No mass effect or evidence of associated hemorrhage.  2. Intracranial atherosclerosis with no major circle of Kellen Dutch  branch occlusion.  3. 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  4. Chronic small vessel ischemia.     Assessment/Plan:  1. Cerebrovascular disease, left hemiparesis  2. Gait disorder  3. Memory disorder  4. Depression  The patient has sustained a stroke with a left hemiparesis that has altered her activities of daily living. The patient requires assistance with bathing and dressing, and she has significant limitation with her  ability to ambulate. The patient developed some depression following this. The patient was on low-dose aspirin at the time stroke, I have recommended the use of Plavix instead of aspirin, but the daughter does not wish to initiate this switch at this time. The patient is on antidepressant medications, but treatment of the depression should also include socialization of the patient. The patient currently is restricted to her own home, and she rarely gets outside the house. I have recommended that the daughter initiate trips outside the house such as going to a restaurant or going to the park on a regular basis, and they are also to consider getting involved with the [facility] which may be of some benefit as well. The patient will followup through this office if needed.   Marlan Palau. Keith Dwan Fennel MD 12/09/2013 10:24 PM  [facility] [address] [place], [place] [id number]  Phone [phone] Fax [phone]

## 2013-12-09 NOTE — Patient Instructions (Signed)
Stroke Prevention Some medical conditions and behaviors are associated with an increased chance of having a stroke. You may prevent a stroke by making healthy choices and managing medical conditions. HOW CAN I REDUCE MY RISK OF HAVING A STROKE?   Stay physically active. Get at least 30 minutes of activity on most or all days.  Do not smoke. It may also be helpful to avoid exposure to secondhand smoke.  Limit alcohol use. Moderate alcohol use is considered to be:  No more than 2 drinks per day for men.  No more than 1 drink per day for nonpregnant women.  Eat healthy foods. This involves:  Eating 5 or more servings of fruits and vegetables a day.  Making dietary changes that address high blood pressure (hypertension), high cholesterol, diabetes, or obesity.  Manage your cholesterol levels.  Making food choices that are high in fiber and low in saturated fat, trans fat, and cholesterol may control cholesterol levels.  Take any prescribed medicines to control cholesterol as directed by your health care provider.  Manage your diabetes.  Controlling your carbohydrate and sugar intake is recommended to manage diabetes.  Take any prescribed medicines to control diabetes as directed by your health care provider.  Control your hypertension.  Making food choices that are low in salt (sodium), saturated fat, trans fat, and cholesterol is recommended to manage hypertension.  Take any prescribed medicines to control hypertension as directed by your health care provider.  Maintain a healthy weight.  Reducing calorie intake and making food choices that are low in sodium, saturated fat, trans fat, and cholesterol are recommended to manage weight.  Stop drug abuse.  Avoid taking birth control pills.  Talk to your health care provider about the risks of taking birth control pills if you are over 35 years old, smoke, get migraines, or have ever had a blood clot.  Get evaluated for sleep  disorders (sleep apnea).  Talk to your health care provider about getting a sleep evaluation if you snore a lot or have excessive sleepiness.  Take medicines only as directed by your health care provider.  For some people, aspirin or blood thinners (anticoagulants) are helpful in reducing the risk of forming abnormal blood clots that can lead to stroke. If you have the irregular heart rhythm of atrial fibrillation, you should be on a blood thinner unless there is a good reason you cannot take them.  Understand all your medicine instructions.  Make sure that other conditions (such as anemia or atherosclerosis) are addressed. SEEK IMMEDIATE MEDICAL CARE IF:   You have sudden weakness or numbness of the face, arm, or leg, especially on one side of the body.  Your face or eyelid droops to one side.  You have sudden confusion.  You have trouble speaking (aphasia) or understanding.  You have sudden trouble seeing in one or both eyes.  You have sudden trouble walking.  You have dizziness.  You have a loss of balance or coordination.  You have a sudden, severe headache with no known cause.  You have new chest pain or an irregular heartbeat. Any of these symptoms may represent a serious problem that is an emergency. Do not wait to see if the symptoms will go away. Get medical help at once. Call your local emergency services (911 in [place] Do not drive yourself to the hospital. Document Released: 03/29/2004 Document Revised: 07/06/2013 Document Reviewed: 08/22/2012 [employer] Patient Information 2015 [employer]. This information is not intended to replace advice given   to you by your health care provider. Make sure you discuss any questions you have with your health care provider.  

## 2014-01-16 ENCOUNTER — Other Ambulatory Visit (HOSPITAL_BASED_OUTPATIENT_CLINIC_OR_DEPARTMENT_OTHER): Payer: Self-pay | Admitting: Internal Medicine

## 2014-01-20 ENCOUNTER — Encounter: Payer: Self-pay | Admitting: Neurology

## 2014-01-26 ENCOUNTER — Encounter: Payer: Self-pay | Admitting: Neurology

## 2014-01-27 ENCOUNTER — Other Ambulatory Visit: Payer: Self-pay | Admitting: Internal Medicine

## 2014-01-27 ENCOUNTER — Ambulatory Visit
Admission: RE | Admit: 2014-01-27 | Discharge: 2014-01-27 | Disposition: A | Discharge status: Home / Self Care | Payer: Medicare Other | Source: Ambulatory Visit | Attending: Internal Medicine | Admitting: Internal Medicine

## 2014-01-27 DIAGNOSIS — J209 Acute bronchitis, unspecified: Secondary | ICD-10-CM

## 2014-02-07 ENCOUNTER — Other Ambulatory Visit: Payer: Self-pay | Admitting: Internal Medicine

## 2014-02-11 ENCOUNTER — Encounter (HOSPITAL_COMMUNITY): Payer: Self-pay | Admitting: Surgery

## 2014-02-28 ENCOUNTER — Other Ambulatory Visit (HOSPITAL_BASED_OUTPATIENT_CLINIC_OR_DEPARTMENT_OTHER): Payer: Self-pay | Admitting: Internal Medicine

## 2014-03-18 ENCOUNTER — Encounter (HOSPITAL_COMMUNITY): Payer: Self-pay | Admitting: Surgery

## 2014-04-10 ENCOUNTER — Emergency Department (HOSPITAL_COMMUNITY): Payer: Medicare HMO

## 2014-04-10 ENCOUNTER — Encounter (HOSPITAL_COMMUNITY): Payer: Self-pay | Admitting: Emergency Medicine

## 2014-04-10 ENCOUNTER — Emergency Department (HOSPITAL_COMMUNITY)
Admission: EM | Admit: 2014-04-10 | Discharge: 2014-04-10 | Disposition: A | Discharge status: Home / Self Care | Payer: Medicare HMO | Attending: Emergency Medicine | Admitting: Emergency Medicine

## 2014-04-10 DIAGNOSIS — Z9889 Other specified postprocedural states: Secondary | ICD-10-CM | POA: Diagnosis not present

## 2014-04-10 DIAGNOSIS — I69359 Hemiplegia and hemiparesis following cerebral infarction affecting unspecified side: Secondary | ICD-10-CM

## 2014-04-10 DIAGNOSIS — E780 Pure hypercholesterolemia: Secondary | ICD-10-CM | POA: Diagnosis not present

## 2014-04-10 DIAGNOSIS — I69398 Other sequelae of cerebral infarction: Secondary | ICD-10-CM

## 2014-04-10 DIAGNOSIS — Z87440 Personal history of urinary (tract) infections: Secondary | ICD-10-CM | POA: Diagnosis not present

## 2014-04-10 DIAGNOSIS — Z79899 Other long term (current) drug therapy: Secondary | ICD-10-CM | POA: Diagnosis not present

## 2014-04-10 DIAGNOSIS — Z7982 Long term (current) use of aspirin: Secondary | ICD-10-CM | POA: Insufficient documentation

## 2014-04-10 DIAGNOSIS — E119 Type 2 diabetes mellitus without complications: Secondary | ICD-10-CM | POA: Diagnosis not present

## 2014-04-10 DIAGNOSIS — M109 Gout, unspecified: Secondary | ICD-10-CM | POA: Diagnosis not present

## 2014-04-10 DIAGNOSIS — R531 Weakness: Secondary | ICD-10-CM | POA: Diagnosis not present

## 2014-04-10 DIAGNOSIS — Z8659 Personal history of other mental and behavioral disorders: Secondary | ICD-10-CM | POA: Diagnosis not present

## 2014-04-10 DIAGNOSIS — Z794 Long term (current) use of insulin: Secondary | ICD-10-CM | POA: Insufficient documentation

## 2014-04-10 DIAGNOSIS — Z8719 Personal history of other diseases of the digestive system: Secondary | ICD-10-CM | POA: Insufficient documentation

## 2014-04-10 DIAGNOSIS — M1990 Unspecified osteoarthritis, unspecified site: Secondary | ICD-10-CM | POA: Insufficient documentation

## 2014-04-10 DIAGNOSIS — I129 Hypertensive chronic kidney disease with stage 1 through stage 4 chronic kidney disease, or unspecified chronic kidney disease: Secondary | ICD-10-CM | POA: Diagnosis not present

## 2014-04-10 DIAGNOSIS — N189 Chronic kidney disease, unspecified: Secondary | ICD-10-CM | POA: Insufficient documentation

## 2014-04-10 DIAGNOSIS — R4182 Altered mental status, unspecified: Secondary | ICD-10-CM | POA: Diagnosis present

## 2014-04-10 LAB — COMPREHENSIVE METABOLIC PANEL
ALT: 16 U/L (ref 0–35)
ANION GAP: 9 (ref 5–15)
AST: 24 U/L (ref 0–37)
Albumin: 3.3 g/dL — ABNORMAL LOW (ref 3.5–5.2)
Alkaline Phosphatase: 80 U/L (ref 39–117)
BILIRUBIN TOTAL: 0.4 mg/dL (ref 0.3–1.2)
BUN: 13 mg/dL (ref 6–23)
CALCIUM: 9 mg/dL (ref 8.4–10.5)
CO2: 31 mmol/L (ref 19–32)
CREATININE: 0.95 mg/dL (ref 0.50–1.10)
Chloride: 101 mmol/L (ref 96–112)
GFR calc Af Amer: 58 mL/min — ABNORMAL LOW (ref >90)
GFR, EST NON AFRICAN AMERICAN: 50 mL/min — AB (ref >90)
Glucose, Bld: 90 mg/dL (ref 70–99)
Potassium: 3.5 mmol/L (ref 3.5–5.1)
Sodium: 141 mmol/L (ref 135–145)
TOTAL PROTEIN: 7.1 g/dL (ref 6.0–8.3)

## 2014-04-10 LAB — URINALYSIS, ROUTINE W REFLEX MICROSCOPIC
BILIRUBIN URINE: NEGATIVE
Glucose, UA: NEGATIVE mg/dL
Hgb urine dipstick: NEGATIVE
KETONES UR: NEGATIVE mg/dL
Nitrite: NEGATIVE
PH: 8 (ref 5.0–8.0)
Protein, ur: NEGATIVE mg/dL
SPECIFIC GRAVITY, URINE: 1.009 (ref 1.005–1.030)
UROBILINOGEN UA: 0.2 mg/dL (ref 0.0–1.0)

## 2014-04-10 LAB — CBC WITH DIFFERENTIAL/PLATELET
Basophils Absolute: 0 K/uL (ref 0.0–0.1)
Basophils Relative: 1 % (ref 0–1)
Eosinophils Absolute: 0.2 K/uL (ref 0.0–0.7)
Eosinophils Relative: 3 % (ref 0–5)
HCT: 39.9 % (ref 36.0–46.0)
Hemoglobin: 13 g/dL (ref 12.0–15.0)
Lymphocytes Relative: 32 % (ref 12–46)
Lymphs Abs: 2.1 K/uL (ref 0.7–4.0)
MCH: 28.8 pg (ref 26.0–34.0)
MCHC: 32.6 g/dL (ref 30.0–36.0)
MCV: 88.3 fL (ref 78.0–100.0)
Monocytes Absolute: 0.5 K/uL (ref 0.1–1.0)
Monocytes Relative: 7 % (ref 3–12)
Neutro Abs: 3.8 K/uL (ref 1.7–7.7)
Neutrophils Relative %: 57 % (ref 43–77)
Platelets: 180 K/uL (ref 150–400)
RBC: 4.52 MIL/uL (ref 3.87–5.11)
RDW: 16.1 % — ABNORMAL HIGH (ref 11.5–15.5)
WBC: 6.6 K/uL (ref 4.0–10.5)

## 2014-04-10 LAB — URINE MICROSCOPIC-ADD ON

## 2014-04-10 NOTE — ED Notes (Signed)
Pt.'s daughter reports progressing leg weakness , confusion and slurred speech onset this week , denies fever or chills. Pt. receiving physical therapy at home .

## 2014-04-10 NOTE — ED Notes (Signed)
In room attempting to walk patient per order.  Per daughter has not walked since she had her stroke.  Md made aware.

## 2014-04-10 NOTE — ED Notes (Signed)
Discharged home.  Incontinence care given.  Daughter assisted to get patient in wheelchair and to the car.

## 2014-04-10 NOTE — Discharge Instructions (Signed)
Your workup today has not shown any new stroke.  Continue with your home therapy.  Follow-up with your Dr. for recheck.  Stop using on hydrocodone as it may be contributing to hallucinations.   Weakness Weakness is a lack of strength. It may be felt all over the body (generalized) or in one specific part of the body (focal). Some causes of weakness can be serious. You may need further medical evaluation, especially if you are elderly or you have a history of immunosuppression (such as chemotherapy or HIV), kidney disease, heart disease, or diabetes. CAUSES  Weakness can be caused by many different things, including:  Infection.  Physical exhaustion.  Internal bleeding or other blood loss that results in a lack of red blood cells (anemia).  Dehydration. This cause is more common in elderly people.  Side effects or electrolyte abnormalities from medicines, such as pain medicines or sedatives.  Emotional distress, anxiety, or depression.  Circulation problems, especially severe peripheral arterial disease.  Heart disease, such as rapid atrial fibrillation, bradycardia, or heart failure.  Nervous system disorders, such as Guillain-Barr syndrome, multiple sclerosis, or stroke. DIAGNOSIS  To find the cause of your weakness, your caregiver will take your history and perform a physical exam. Lab tests or X-rays may also be ordered, if needed. TREATMENT  Treatment of weakness depends on the cause of your symptoms and can vary greatly. HOME CARE INSTRUCTIONS   Rest as needed.  Eat a well-balanced diet.  Try to get some exercise every day.  Only take over-the-counter or prescription medicines as directed by your caregiver. SEEK MEDICAL CARE IF:   Your weakness seems to be getting worse or spreads to other parts of your body.  You develop new aches or pains. SEEK IMMEDIATE MEDICAL CARE IF:   You cannot perform your normal daily activities, such as getting dressed and feeding  yourself.  You cannot walk up and down stairs, or you feel exhausted when you do so.  You have shortness of breath or chest pain.  You have difficulty moving parts of your body.  You have weakness in only one area of the body or on only one side of the body.  You have a fever.  You have trouble speaking or swallowing.  You cannot control your bladder or bowel movements.  You have black or bloody vomit or stools. MAKE SURE YOU:  Understand these instructions.  Will watch your condition.  Will get help right away if you are not doing well or get worse. Document Released: 02/19/2005 Document Revised: 08/21/2011 Document Reviewed: 04/20/2011 [facility] Patient Information 2015 [place]. This information is not intended to replace advice given to you by your health care provider. Make sure you discuss any questions you have with your health care provider.

## 2014-04-10 NOTE — ED Provider Notes (Signed)
CSN: [id number]     Arrival date & time 04/10/14  0135 History   First MD Initiated Contact with Patient 04/10/14 0152     This chart was scribed for [name] Mackie, MD by [name], ED Scribe. This patient was seen in room A08C/A08C and the patient's care was started 2:03 AM.   Chief Complaint  Patient presents with  . Altered Mental Status  . Weakness  . Aphasia   HPI  HPI Comments: [name] here with her daughter is a 81 y.o. female with a PMHx of HTN, DM, Gout, diabetic neuropathy, GERD, and PVD who presents to the Emergency Department here for altered mental status this evening. Daughter reports bilateral leg weakness, confusion, and slurred speech x 1 week. Weakness has progressively worsened since time of onset. She also mentions visual hallucinations x 2-3 days. Daughter reports history of hallucinations with certain medications. [name] was given Hydrocodone on 2/3 and 2/4 for break trough pain. However, no history of hallucinations with this medication. Pt currently undergoes physical therapy and daughter says PT initially noted change in activity and behavior a few days ago. No recent fever, chest pain, SOB, or chills. [name] currently has a sitter that helps her throughout the day when her daughter is at work. Pt has a history of stroke which affected her L side. No known allergies to medications.  Past Medical History  Diagnosis Date  . UTI (urinary tract infection)   . Hypertension   . Diabetes mellitus   . Gout   . High cholesterol   . Complication of anesthesia   . Diabetic neuropathy   . PVD (peripheral vascular disease)   . Carotid artery occlusion   . Arthritis   . Chronic kidney disease   . GERD (gastroesophageal reflux disease)   . Vertigo   . Venous insufficiency   . Mood disorder   . Dysphagia   . Hemiparesis and alteration of sensations as late effects of stroke 12/09/2013   Past Surgical History  Procedure Laterality Date  . Hemorrhoid  banding    . Abdominal hysterectomy    . Cardiac catheterization    . Lower extremity angiogram N/A 05/26/2013    Procedure: LOWER EXTREMITY ANGIOGRAM;  Surgeon: [name] Libman, MD;  Location[facility] CATH LAB;  Service: Cardiovascular;  Laterality: N/A;   Family History  Problem Relation Age of Onset  . Heart disease Daughter   . Hypertension Daughter   . Diabetes Son   . Heart disease Son   . Diabetes Father   . Diabetes Sister   . Diabetes Sister    History  Substance Use Topics  . Smoking status: Never Smoker   . Smokeless tobacco: Never Used  . Alcohol Use: No   OB History    No data available     Review of Systems  Constitutional: Negative for fever and chills.  Neurological: Positive for weakness.  Psychiatric/Behavioral: Positive for hallucinations and confusion.  All other systems reviewed and are negative.     Allergies  Review of patient's allergies indicates no known allergies.  Home Medications   Prior to Admission medications   Medication Sig Start Date End Date Taking? Authorizing Provider  albuterol (PROVENTIL HFA;VENTOLIN HFA) 108 (90 BASE) MCG/ACT inhaler Inhale 2 puffs into the lungs as needed. 02/28/13 02/28/14  Historical Provider, MD  allopurinol (ZYLOPRIM) 100 MG tablet Take 100 mg by mouth 2 (two) times daily.    Historical Provider, MD  amLODipine (NORVASC) 10 MG  tablet Take 1 tablet (10 mg total) by mouth daily. 11/26/11   [name] Hartshornavid Tat, MD  aspirin EC 81 MG EC tablet Take 1 tablet (81 mg total) by mouth daily. 11/26/11   [name] Hartshornavid Tat, MD  atorvastatin (LIPITOR) 20 MG tablet Take 20 mg by mouth at bedtime.    Historical Provider, MD  atorvastatin (LIPITOR) 20 MG tablet TAKE 1 TABLET BY MOUTH EVERY DAY 01/18/14   [name] HanksAnne D Alexander, MD  cetirizine (ZYRTEC) 10 MG tablet Take 10 mg by mouth daily.    Historical Provider, MD  donepezil (ARICEPT) 10 MG tablet Take 10 mg by mouth at bedtime.     Historical Provider, MD  gabapentin (NEURONTIN) 300 MG capsule Take  300 mg by mouth 3 (three) times daily.     Historical Provider, MD  glipiZIDE (GLUCOTROL) 5 MG tablet TAKE 1 TABLET BY MOUTH TWICE DAILY 11/05/13   [name] L Reed, DO  glycopyrrolate (ROBINUL) 1 MG tablet Take 1 mg by mouth 2 (two) times daily.    Historical Provider, MD  HYDROcodone-acetaminophen (NORCO/VICODIN) 5-325 MG per tablet Take 1 tablet by mouth every 6 (six) hours as needed for moderate pain. 06/03/13   [name] SellerJessica K Eubanks, NP  insulin aspart (NOVOLOG) 100 UNIT/ML injection Inject 0-9 Units into the skin every 4 (four) hours. 06/02/13   [name] MurrayAlma M Devine, MD  Insulin Lispro Prot & Lispro (HUMALOG MIX 75/25 KWIKPEN) (75-25) 100 UNIT/ML SUPN Inject 20-33 Units into the skin 2 (two) times daily. 33 units in am, 20 units in pm    Historical Provider, MD  Vortioxetine HBr (BRINTELLIX) 20 MG TABS Take 20 mg by mouth daily.    Historical Provider, MD   Triage Vitals: BP 151/75 mmHg  Pulse 69  Temp(Src) 97.3 F (36.3 C) (Oral)  Resp 18  SpO2 97%   Physical Exam  Constitutional: She is oriented to person, place, and time. She appears well-developed and well-nourished. No distress.  HENT:  Head: Normocephalic and atraumatic.  Eyes: EOM are normal.  Neck: Normal range of motion.  Cardiovascular: Normal rate, regular rhythm and normal heart sounds.   Pulmonary/Chest: Effort normal and breath sounds normal.  Abdominal: Soft. She exhibits no distension. There is no tenderness.  Musculoskeletal: Normal range of motion.  Neurological: She is alert and oriented to person, place, and time.  Skin: Skin is warm and dry.  Psychiatric: She has a normal mood and affect. Judgment normal.  Nursing note and vitals reviewed.   ED Course  Procedures (including critical care time)  DIAGNOSTIC STUDIES: Oxygen Saturation is 97% on RA, adequate by my interpretation.    COORDINATION OF CARE: 2:08 AM-Discussed treatment plan with pt at bedside and pt agreed to plan.  a   Labs Review Labs Reviewed   COMPREHENSIVE METABOLIC PANEL - Abnormal; Notable for the following:    Albumin 3.3 (*)    GFR calc non Af Amer 50 (*)    GFR calc Af Amer 58 (*)    All other components within normal limits  CBC WITH DIFFERENTIAL/PLATELET - Abnormal; Notable for the following:    RDW 16.1 (*)    All other components within normal limits  URINALYSIS, ROUTINE W REFLEX MICROSCOPIC - Abnormal; Notable for the following:    Leukocytes, UA TRACE (*)    All other components within normal limits  URINE CULTURE  URINE MICROSCOPIC-ADD ON    Imaging Review Dg Chest 2 View  04/10/2014   CLINICAL DATA:  81 year old female with weakness.  Confusion.  EXAM: CHEST  2 VIEW  COMPARISON:  01/27/2014  FINDINGS: Lower lung volumes from prior. Cardiomegaly and tortuous thoracic aorta, unchanged from prior exam. There is minimal central vascular congestion. No consolidation to suggest pneumonia. Left hilar calcified lymph node, unchanged. No pleural effusion or pneumothorax. No acute osseous abnormalities are seen.  IMPRESSION: Stable cardiomegaly and tortuous thoracic aorta. Minimal central vascular congestion.   Electronically Signed   By: Rubye Oaks M.D.   On: 04/10/2014 02:49   Ct Head Wo Contrast  04/10/2014   CLINICAL DATA:  Left-sided weakness and slurred speech for 4-5 days  EXAM: CT HEAD WITHOUT CONTRAST  TECHNIQUE: Contiguous axial images were obtained from the base of the skull through the vertex without intravenous contrast.  COMPARISON:  Head CT 10/02/2013  FINDINGS: Stable age related cerebral atrophy, ventriculomegaly and periventricular white matter disease. Remote lacunar-type infarcts and bilateral basal ganglia calcifications. No new/acute intracranial findings. No hemispheric infarction an or intracranial hemorrhage. Stable vascular calcifications. The brainstem and cerebellum are grossly normal and stable. Remote left cerebellar infarct noted.  The bony structures are intact.  Stable chronic sinus disease.   IMPRESSION: Stable age related cerebral atrophy, ventriculomegaly and periventricular white matter disease.  Remote infarcts.  No new/acute intracranial findings or mass lesions.   Electronically Signed   By: Loralie Champagne M.D.   On: 04/10/2014 02:33     EKG Interpretation None      MDM   Final diagnoses:  Weakness  Left-sided weakness  Hemiparesis and alteration of sensations as late effects of stroke    81 year old female with concern for recurrent stroke due to physical therapist noting increased weakness on the left side.  Patient without complaint.  Daughter reports some hallucinations since she has been on hydrocodone for pain.  I personally performed the services described in this documentation, which was scribed in my presence. The recorded information has been reviewed and is accurate.  6:24 AM No specific findings on workup today.  Daughter is concerned about hallucinations.  Patient has been having.  I have advised to stop giving hydrocodone as it may be triggering the hallucinations.  Patient to follow-up with primary care doctor.   [name] Mackie, MD 04/10/14 [phone]

## 2014-04-11 LAB — URINE CULTURE: Colony Count: 15000

## 2014-04-28 ENCOUNTER — Ambulatory Visit: Payer: Medicare HMO | Attending: Internal Medicine

## 2014-04-28 DIAGNOSIS — R262 Difficulty in walking, not elsewhere classified: Secondary | ICD-10-CM | POA: Insufficient documentation

## 2014-04-28 DIAGNOSIS — R5381 Other malaise: Secondary | ICD-10-CM

## 2014-04-28 DIAGNOSIS — I69398 Other sequelae of cerebral infarction: Secondary | ICD-10-CM | POA: Insufficient documentation

## 2014-04-28 DIAGNOSIS — R29898 Other symptoms and signs involving the musculoskeletal system: Secondary | ICD-10-CM

## 2014-04-28 NOTE — Therapy (Signed)
[facility] [address] [place], [place], [address] Phone: [phone]   Fax:  [phone]  Physical Therapy Evaluation  Patient Details  Name: [name] MRN: [id number] Date of Birth: 08/18/1917 Referring Provider:  [name], MD  Encounter Date: 04/28/2014      PT End of Session - 04/28/14 1033    Visit Number 1   Number of Visits 17   Date for PT Re-Evaluation 06/27/14   Authorization Type [employer] and medicaid (waiting for [employer])   PT Start Time 0935   PT Stop Time 1015   PT Time Calculation (min) 40 min   Equipment Utilized During Treatment Gait belt   Activity Tolerance Patient tolerated treatment well   Behavior During Therapy [facility] for tasks assessed/performed      Past Medical History  Diagnosis Date  . UTI (urinary tract infection)   . Hypertension   . Diabetes mellitus   . Gout   . High cholesterol   . Complication of anesthesia   . Diabetic neuropathy   . PVD (peripheral vascular disease)   . Carotid artery occlusion   . Arthritis   . Chronic kidney disease   . GERD (gastroesophageal reflux disease)   . Vertigo   . Venous insufficiency   . Mood disorder   . Dysphagia   . Hemiparesis and alteration of sensations as late effects of stroke 12/09/2013    Past Surgical History  Procedure Laterality Date  . Hemorrhoid banding    . Abdominal hysterectomy    . Cardiac catheterization    . Lower extremity angiogram N/A 05/26/2013    Procedure: LOWER EXTREMITY ANGIOGRAM;  Surgeon: Nada Libman, MD;  Location: [facility] CATH LAB;  Service: Cardiovascular;  Laterality: N/A;    There were no vitals taken for this visit.  Visit Diagnosis:  Difficulty walking - Plan: PT plan of care cert/re-cert  Left leg weakness - Plan: PT plan of care cert/re-cert  Debility - Plan: PT plan of care cert/re-cert      Subjective Assessment - 04/28/14 1032    Symptoms Difficulty with walking,  L-sided weakness, impaired balance.   Pertinent History Memory deficits, recent hallucinations, (MD aware), diabetes, CVA   Patient Stated Goals To walk again and sew   Currently in Pain? No/denies          [facility] PT Assessment - 04/28/14 0001    Assessment   Medical Diagnosis CVA   Onset Date 05/03/13   Prior Therapy SNF and HHPT after CVA.   Precautions   Precautions Fall   Restrictions   Weight Bearing Restrictions No   Balance Screen   Has the patient fallen in the past 6 months Yes   How many times? 1   Has the patient had a decrease in activity level because of a fear of falling?  Yes   Is the patient reluctant to leave their home because of a fear of falling?  No   Home Environment   Living Enviornment Private residence   Living Arrangements Children  Sandra-daughter, and her husband and 2 children.   Available Help at Discharge Family   Type of Home House   Home Access Stairs to enter   Entrance Stairs-Number of Steps 1   Entrance Stairs-Rails None   Home Layout One level   Home Equipment Walker - 4 wheels;Wheelchair - Environmental consultant   Prior Function   Level of Independence Independent with basic ADLs;Requires assistive device  for independence   Vocation Retired   Leisure sewing, write notes   Cognition   Overall Cognitive Status Impaired/Different from baseline   Sensation   Light Touch Appears Intact   Coordination   Gross Motor Movements are Fluid and Coordinated Yes   Fine Motor Movements are Fluid and Coordinated Yes  L UE impaired coordination    Posture/Postural Control   Posture/Postural Control Postural limitations   Postural Limitations Posterior pelvic tilt;Rounded Shoulders;Forward head   Tone   Assessment Location --  none noted   ROM / Strength   AROM / PROM / Strength AROM;Strength   AROM   Overall AROM  Deficits   Overall AROM Comments L UE limited to approx. 10 degrees of shoulder flexion and abduction.   R UE and B LE AROM WFL.   Strength   Overall Strength Deficits   Overall Strength Comments R LE WFL, L hip flexion: 3+/5, knee ext: 4/5, knee flex: 4/5 and L ankle dorsiflexion: 4/5.   Transfers   Transfers Sit to Stand;Stand to [employer] Transfers   Sit to Stand 4: Min guard;With upper extremity assist  to/from W/C and mat   Stand to Sit 4: Min guard;With upper extremity assist   Stand Pivot Transfers 4: Min guard;Other (comment)  W/C<>mat   Ambulation/Gait   Ambulation/Gait Yes   Ambulation/Gait Assistance 4: Min assist;4: Min guard   Ambulation/Gait Assistance Details Pt ambulated with min guard to min A in order to maintain balance, especially while performing turns. VC's for sequencing with hemi-walker.   Ambulation Distance (Feet) 10 Feet   Assistive device Hemi-walker   Gait Pattern Step-to pattern;Decreased step length - right;Left circumduction;Decreased hip/knee flexion - left   Ambulation Surface Level;Indoor   Gait velocity Unable to test, as pt unable to amb. >10'.   Medical sales representative Yes   Wheelchair Assistance 5: Supervision   Wheelchair Assistance Details (indicate cue type and reason) Cues to lock/unlock brakes and to avoid obstacles.   Wheelchair Propulsion Right upper extremity;Both lower extermities   Wheelchair Parts Management Supervision/cueing   Distance 50'   Balance   Balance Assessed Yes   Static Standing Balance   Static Standing - Balance Support No upper extremity supported   Static Standing - Level of Assistance 4: Min assist   Static Standing - Comment/# of Minutes Pt required min A to maintain statice standing balance with feet apart and eyes open for 30 seconds.                            PT Short Term Goals - 04/28/14 1038    PT SHORT TERM GOAL #1   Title Pt will perform HEP with supervision and cues from family in order to improve functional mobility. Target date: 05/26/14.   Status New   PT  SHORT TERM GOAL #2   Title Pt will be able to stand with min guard for 30 seconds, with feet apart, with no LOB in order to improve functional mobility. Target date: 05/26/14.   Status New   PT SHORT TERM GOAL #3   Title Pt will ambulate 30' over even terrain, with LRAD, and min guard to improve functional mobility. Target date: 05/26/14.   Status New   PT SHORT TERM GOAL #4   Title Pt will perform all transfers(stand pivots and sit<>stands) with supevision in order to improve functional mobility. Target date: 05/26/14.   Status New  PT Long Term Goals - 04/28/14 1040    PT LONG TERM GOAL #1   Title Pt will verbalize understanding of CVA symptoms and risk factors, with cues from family, to decrease risk of another CVA. Target date: 06/23/14.   Status New   PT LONG TERM GOAL #2   Title Pt will ambulate 60', with LRAD, over even terrain at MOD I level in order to improve funcitonal mobility. Target date: 06/23/14.   Status New   PT LONG TERM GOAL #3   Title Pt will reach 5" outside BOS without LOB, with supervision, in order to perform  ADLs without LOB. Target date: 06/23/14.   Status New   PT LONG TERM GOAL #4   Title Pt will perform all transfers (stand pivot and sit<>stand) at MOD I level in order to improve functional mobility. Target date: 06/23/14.   Status New   PT LONG TERM GOAL #5   Title Write TUG and gait speed goal if appropriate. Target date: 06/23/14.   Status New               Plan - 04/28/14 0946    Clinical Impression Statement Pt is a pleasant 81 y/o female presenting to OPPT neuro s/p CVA in 05/2013, which caused L-sided weakness and difficulty with walking. Pt discharged to SNF for 4-5 weeks after hospitalization for CVA , she also received HHPT upon d/c from SNF. Pt's daughter reported HHPT ceased yesterday (04/27/14). Pt's daughter present, Dois DavenportSandra, and provided history as pt has known memory deficits. Pt has been experiencing hallucination for the past 2  weeks, MD thought it was due to kidney infection but pt's hallucinations did not cease after completion of anti-biotic course. MD is aware hallucinations are still present.  Pt used a rollator prior to CVA and was independent in all ADLs. Pt now has a home health aide that assists pt with all ADLs every day, for approx. 2 hours/day. Pt's daughter reported it is difficult to help pt perform HEP and to get pt to appointments due to her work schedule. Pt will use [employer] for transportation.   Pt will benefit from skilled therapeutic intervention in order to improve on the following deficits Difficulty walking;Decreased endurance;Decreased safety awareness;Decreased knowledge of use of DME;Decreased balance;Impaired UE functional use;Decreased strength;Decreased mobility   Rehab Potential Good   Clinical Impairments Affecting Rehab Potential Memory deficits.   PT Frequency 2x / week   PT Duration 8 weeks   PT Treatment/Interventions ADLs/Self Care Home Management;Gait training;Neuromuscular re-education;Stair training;Functional mobility training;Biofeedback;Patient/family education;Wheelchair mobility training;Therapeutic activities;Therapeutic exercise;Manual techniques;Electrical Stimulation;DME Instruction;Balance training   PT Next Visit Plan Perform TUG and 10MWT when appropriate. Initiate balance/strength HEP.   Consulted and Agree with Plan of Care Patient;Family member/caregiver   Family Member Consulted pt's daughter: Dois DavenportSandra.          G-Codes - 04/28/14 1049    Functional Assessment Tool Used Pt can ambulate 10' with hemi-walker and min guard-min A, pt performs sit<>stand and stand pivot transfers with min guard, static standing balance with feet apart for 30 seconds with min A   Functional Limitation Mobility: Walking and moving around   Mobility: Walking and Moving Around Current Status [phone]([id number]) At least 60 percent but less than 80 percent impaired, limited or restricted   Mobility: Walking  and Moving Around Goal Status [phone](G8979) At least 20 percent but less than 40 percent impaired, limited or restricted       Problem List Patient Active Problem List  Diagnosis Date Noted  . Hemiparesis and alteration of sensations as late effects of stroke 12/09/2013  . Type II or unspecified type diabetes mellitus with renal manifestations, uncontrolled 08/17/2013  . Wound, open, toe with complication 07/17/2013  . Acute lacunar infarction 06/05/2013  . DM type 2, uncontrolled, with neuropathy 06/05/2013  . PVD (peripheral vascular disease) 06/05/2013  . Hyperlipidemia LDL goal < 70 06/05/2013  . CKD (chronic kidney disease) stage 3, GFR 30-59 ml/min 06/05/2013  . Hypertensive renal disease 06/05/2013  . CVA (cerebral infarction) 05/30/2013  . Left-sided weakness 05/30/2013  . CKD (chronic kidney disease), stage III 05/30/2013  . Peripheral vascular disease, unspecified 12/22/2012  . Diabetic neuropathy   . DM2 (diabetes mellitus, type 2) 11/22/2011  . Hypertension   . High cholesterol     Raeya Merritts L 04/28/2014, 10:53 AM  [facility] [address] [place], [place], [address] Phone: [phone]   Fax:  [phone]    Zerita Boers, PT,DPT 04/28/2014 10:53 AM Phone: [phone] Fax: [phone]

## 2014-05-04 ENCOUNTER — Ambulatory Visit: Payer: Medicare HMO | Attending: Internal Medicine

## 2014-05-04 DIAGNOSIS — R29898 Other symptoms and signs involving the musculoskeletal system: Secondary | ICD-10-CM

## 2014-05-04 DIAGNOSIS — I69398 Other sequelae of cerebral infarction: Secondary | ICD-10-CM | POA: Insufficient documentation

## 2014-05-04 DIAGNOSIS — R262 Difficulty in walking, not elsewhere classified: Secondary | ICD-10-CM | POA: Insufficient documentation

## 2014-05-04 DIAGNOSIS — R5381 Other malaise: Secondary | ICD-10-CM

## 2014-05-04 NOTE — Therapy (Signed)
[facility] [address] [place], [place], [address] Phone: [phone]   Fax:  [phone]  Physical Therapy Treatment  Patient Details  Name: [name] MRN: [id number] Date of Birth: [birth year] Referring Provider:  Philis Fendt, MD  Encounter Date: 05/04/2014      PT End of Session - 05/04/14 1641    Visit Number 2   Number of Visits 17   Date for PT Re-Evaluation 06/27/14   Authorization Type [employer] and medicaid (waiting for Craig Staggers)   PT Start Time [phone]   PT Stop Time 0928   PT Time Calculation (min) 38 min   Equipment Utilized During Treatment Gait belt   Activity Tolerance Patient tolerated treatment well   Behavior During Therapy [facility] for tasks assessed/performed      Past Medical History  Diagnosis Date  . UTI (urinary tract infection)   . Hypertension   . Diabetes mellitus   . Gout   . High cholesterol   . Complication of anesthesia   . Diabetic neuropathy   . PVD (peripheral vascular disease)   . Carotid artery occlusion   . Arthritis   . Chronic kidney disease   . GERD (gastroesophageal reflux disease)   . Vertigo   . Venous insufficiency   . Mood disorder   . Dysphagia   . Hemiparesis and alteration of sensations as late effects of stroke 12/09/2013    Past Surgical History  Procedure Laterality Date  . Hemorrhoid banding    . Abdominal hysterectomy    . Cardiac catheterization    . Lower extremity angiogram N/A 05/26/2013    Procedure: LOWER EXTREMITY ANGIOGRAM;  Surgeon: Serafina Mitchell, MD;  Location: [facility] CATH LAB;  Service: Cardiovascular;  Laterality: N/A;    There were no vitals taken for this visit.  Visit Diagnosis:  Difficulty walking  Left leg weakness  Debility      Subjective Assessment - 05/04/14 0852    Symptoms Pt arrived 5 minutes late to session. Pt denied falls or changes since last visit. Pt's daughter present.   Pertinent History Memory  deficits, recent hallucinations, (MD aware), diabetes, CVA   Patient Stated Goals To walk again and sew   Currently in Pain? No/denies                    [facility] Adult PT Treatment/Exercise - 05/04/14 0853    Transfers   Transfers Sit to Stand;Stand to Sit;Stand Pivot Transfers   Sit to Stand 4: Min guard;With upper extremity assist;4: Min assist  x10. VC's for sequencing and ant. weight shifting.   Sit to Stand Details (indicate cue type and reason) x15. Cues to improve ant. weight shifting and weight bearing through L LE. Pt required frequent rest breaks during session due to fatigue and decreased SaO2. See clinical impression for details.   Stand to Sit 4: Min guard;With upper extremity assist  x15. VC's for sequencing and to improve eccentric control..   Stand Pivot Transfers 4: Min guard  with hemi-walker, cues for improve L LE step height.   Ambulation/Gait   Ambulation/Gait --   Balance   Balance Assessed Yes   Static Standing Balance   Static Standing - Balance Support No upper extremity supported   Static Standing - Level of Assistance 4: Min assist;Other (comment)  min guard   Static Standing - Comment/# of Minutes Feet apart with min guard-min A to maintain balance for 30sec. x5. Feet apart with eyes  closed and min A to maintain balance 3x10sec. hold. VC's for technique.    Standardized Balance Assessment   Standardized Balance Assessment Timed Up and Go Test;Berg Balance Test   Timed Up and Go Test   TUG Normal TUG   Normal TUG (seconds) 143.37  with hemi-walker                PT Education - 05/04/14 1640    Education provided Yes   Education Details Pursed lip breathing to improve SOB and SaO2, balance/strength HEP.   Person(s) Educated Patient;Child(ren)   Methods Explanation;Demonstration;Verbal cues;Handout;Tactile cues   Comprehension Verbalized understanding;Returned demonstration;Need further instruction          PT Short Term Goals -  05/04/14 1644    PT SHORT TERM GOAL #1   Title Pt will perform HEP with supervision and cues from family in order to improve functional mobility. Target date: 05/26/14.   Status On-going   PT SHORT TERM GOAL #2   Title Pt will be able to stand with min guard for 30 seconds, with feet apart, with no LOB in order to improve functional mobility. Target date: 05/26/14.   Status On-going   PT SHORT TERM GOAL #3   Title Pt will ambulate 30' over even terrain, with LRAD, and min guard to improve functional mobility. Target date: 05/26/14.   Status On-going   PT SHORT TERM GOAL #4   Title Pt will perform all transfers(stand pivots and sit<>stands) with supevision in order to improve functional mobility. Target date: 05/26/14.   Status On-going           PT Long Term Goals - 05/04/14 1644    PT LONG TERM GOAL #1   Title Pt will verbalize understanding of CVA symptoms and risk factors, with cues from family, to decrease risk of another CVA. Target date: 06/23/14.   Status On-going   PT LONG TERM GOAL #2   Title Pt will ambulate 54', with LRAD, over even terrain at MOD I level in order to improve funcitonal mobility. Target date: 06/23/14.   Status On-going   PT LONG TERM GOAL #3   Title Pt will reach 5" outside BOS without LOB, with supervision, in order to perform  ADLs without LOB. Target date: 06/23/14.   Status On-going   PT LONG TERM GOAL #4   Title Pt will perform all transfers (stand pivot and sit<>stand) at MOD I level in order to improve functional mobility. Target date: 06/23/14.   Status On-going   PT LONG TERM GOAL #5   Title Write TUG and gait speed goal if appropriate. Target date: 06/23/14.   Status Partially Met   Additional Long Term Goals   Additional Long Term Goals Yes   PT LONG TERM GOAL #6   Title Pt will perform TUG with LRAD, with supervision, in <120 seconds to decrease falls risk. Target date: 06/23/14.   Status New               Plan - 05/04/14 1641     Clinical Impression Statement Pt limited today by decreased SaO2 (83-85%) during all activity and required seated rest breaks and pursed lip breathing in order to increase SaO2 on room air to >/=92%. PT spent extensive time educating pt and pt's daughter on the importance of improve SaO2 and encouraged pt's daughter to call pt's PCP. PT messaged Dr. Jeanie Cooks regarding decreased SaO2. Pt would continue to benefit from skilled PT to improve safety during functional mobililty.  Pt will benefit from skilled therapeutic intervention in order to improve on the following deficits Difficulty walking;Decreased endurance;Decreased safety awareness;Decreased knowledge of use of DME;Decreased balance;Impaired UE functional use;Decreased strength;Decreased mobility   Rehab Potential Good   Clinical Impairments Affecting Rehab Potential Memory deficits.   PT Frequency 2x / week   PT Duration 8 weeks   PT Treatment/Interventions ADLs/Self Care Home Management;Gait training;Neuromuscular re-education;Stair training;Functional mobility training;Biofeedback;Patient/family education;Wheelchair mobility training;Therapeutic activities;Therapeutic exercise;Manual techniques;Electrical Stimulation;DME Instruction;Balance training   PT Next Visit Plan Progress balance/strength HEP.   Consulted and Agree with Plan of Care Patient;Family member/caregiver   Family Member Consulted pt's daughter: Katharine Look.        Problem List Patient Active Problem List   Diagnosis Date Noted  . Hemiparesis and alteration of sensations as late effects of stroke 12/09/2013  . Type II or unspecified type diabetes mellitus with renal manifestations, uncontrolled 08/17/2013  . Wound, open, toe with complication 15/94/5859  . Acute lacunar infarction 06/05/2013  . DM type 2, uncontrolled, with neuropathy 06/05/2013  . PVD (peripheral vascular disease) 06/05/2013  . Hyperlipidemia LDL goal < 70 06/05/2013  . CKD (chronic kidney disease) stage  3, GFR 30-59 ml/min 06/05/2013  . Hypertensive renal disease 06/05/2013  . CVA (cerebral infarction) 05/30/2013  . Left-sided weakness 05/30/2013  . CKD (chronic kidney disease), stage III 05/30/2013  . Peripheral vascular disease, unspecified 12/22/2012  . Diabetic neuropathy   . DM2 (diabetes mellitus, type 2) 11/22/2011  . Hypertension   . High cholesterol     Miller,Jennifer L 05/04/2014, 4:46 PM  [facility] [address] [place], [place], [address] Phone: [phone]   Fax:  [phone]     Geoffry Paradise, PT,DPT 05/04/2014 4:46 PM Phone: [phone] Fax: [phone]

## 2014-05-04 NOTE — Patient Instructions (Addendum)
Pursed Lip Breathing Some long-term respiratory conditions like COPD and asthma can make it hard to release all the air in your lungs when you breathe out (exhale). This can result in oxygen-poor air building up in your lungs (air trapping) and make it hard to fill your lungs with fresh air during each breath. This can also cause you to feel short of breath.  Pursed lip breathing can help to relieve some of this shortness of breath. By keeping your airways open for a longer amount of time during your breath out, you can empty more air out of your lungs. This will make more space for fresh air during the next breath in (inhalation).  HOW TO PERFORM PURSED LIP BREATHING 1. Close your mouth. 2. Breathe in through your nose, taking a normal-sized breath. The rate of breathing in may be your normal rate, or you can try breathing in with a slow count to two or three if you feel you are not getting enough air. 3. Pucker your lips as if you were going to whistle. 4. Gently tighten your stomach muscles to help push the air out. 5. Breathe out slowly through your pursed lips. You should breathe out at least twice as long as you breathe in. 6.  Be sure you breathe out all of the air, but do not force the air out. GET HELP IF: Your shortness of breath gets worse. GET HELP RIGHT AWAY IF:  You are very short of breath. Document Released: 11/29/2007 Document Revised: 07/06/2013 Document Reviewed: 10/01/2012 [facility] Patient Information 2015 [employer], [place]. This information is not intended to replace advice given to you by your health care provider. Make sure you discuss any questions you have with your health care provider.    Functional Quadriceps: Sit to Stand   Sit on edge of chair, feet flat on floor, use R hand to assist you. Stand upright, extending knees fully. Repeat _10___ times per set. Do _1__ sets per session. Do __2__ sessions per day.  http://orth.exer.us/735   Copyright  VHI. All rights  reserved.   Feet Apart, Varied Arm Positions - Eyes Open **Perform at sink with sturdy chair behind pt.**   With eyes open, feet shoulder width apart, arms at your side, look straight ahead at a stationary object. Hold __30__ seconds. Repeat _3___ times per session. Do __2-3__ sessions per day.  Copyright  VHI. All rights reserved.

## 2014-05-07 ENCOUNTER — Ambulatory Visit: Payer: Medicare HMO | Admitting: Physical Therapy

## 2014-05-11 ENCOUNTER — Ambulatory Visit: Payer: Medicare HMO

## 2014-05-12 ENCOUNTER — Encounter (HOSPITAL_COMMUNITY): Payer: Self-pay | Admitting: Nurse Practitioner

## 2014-05-12 ENCOUNTER — Emergency Department (HOSPITAL_COMMUNITY): Payer: Medicare HMO

## 2014-05-12 ENCOUNTER — Other Ambulatory Visit: Payer: Self-pay

## 2014-05-12 ENCOUNTER — Emergency Department (HOSPITAL_COMMUNITY)
Admission: EM | Admit: 2014-05-12 | Discharge: 2014-05-13 | Disposition: A | Discharge status: Home / Self Care | Payer: Medicare HMO | Attending: Emergency Medicine | Admitting: Emergency Medicine

## 2014-05-12 DIAGNOSIS — R4781 Slurred speech: Secondary | ICD-10-CM | POA: Diagnosis not present

## 2014-05-12 DIAGNOSIS — M1990 Unspecified osteoarthritis, unspecified site: Secondary | ICD-10-CM | POA: Diagnosis not present

## 2014-05-12 DIAGNOSIS — R062 Wheezing: Secondary | ICD-10-CM

## 2014-05-12 DIAGNOSIS — M6281 Muscle weakness (generalized): Secondary | ICD-10-CM | POA: Insufficient documentation

## 2014-05-12 DIAGNOSIS — K219 Gastro-esophageal reflux disease without esophagitis: Secondary | ICD-10-CM | POA: Diagnosis not present

## 2014-05-12 DIAGNOSIS — E780 Pure hypercholesterolemia: Secondary | ICD-10-CM | POA: Diagnosis not present

## 2014-05-12 DIAGNOSIS — Z7982 Long term (current) use of aspirin: Secondary | ICD-10-CM | POA: Diagnosis not present

## 2014-05-12 DIAGNOSIS — I129 Hypertensive chronic kidney disease with stage 1 through stage 4 chronic kidney disease, or unspecified chronic kidney disease: Secondary | ICD-10-CM | POA: Insufficient documentation

## 2014-05-12 DIAGNOSIS — Z79899 Other long term (current) drug therapy: Secondary | ICD-10-CM | POA: Diagnosis not present

## 2014-05-12 DIAGNOSIS — E1140 Type 2 diabetes mellitus with diabetic neuropathy, unspecified: Secondary | ICD-10-CM | POA: Insufficient documentation

## 2014-05-12 DIAGNOSIS — Z794 Long term (current) use of insulin: Secondary | ICD-10-CM | POA: Diagnosis not present

## 2014-05-12 DIAGNOSIS — N189 Chronic kidney disease, unspecified: Secondary | ICD-10-CM | POA: Insufficient documentation

## 2014-05-12 DIAGNOSIS — Z8669 Personal history of other diseases of the nervous system and sense organs: Secondary | ICD-10-CM | POA: Diagnosis not present

## 2014-05-12 DIAGNOSIS — Z8659 Personal history of other mental and behavioral disorders: Secondary | ICD-10-CM | POA: Diagnosis not present

## 2014-05-12 DIAGNOSIS — R531 Weakness: Secondary | ICD-10-CM

## 2014-05-12 DIAGNOSIS — N390 Urinary tract infection, site not specified: Secondary | ICD-10-CM | POA: Insufficient documentation

## 2014-05-12 LAB — I-STAT TROPONIN, ED: Troponin i, poc: 0 ng/mL (ref 0.00–0.08)

## 2014-05-12 LAB — I-STAT CHEM 8, ED
BUN: 21 mg/dL (ref 6–23)
Calcium, Ion: 1.19 mmol/L (ref 1.13–1.30)
Chloride: 101 mmol/L (ref 96–112)
Creatinine, Ser: 1.1 mg/dL (ref 0.50–1.10)
Glucose, Bld: 199 mg/dL — ABNORMAL HIGH (ref 70–99)
HCT: 44 % (ref 36.0–46.0)
Hemoglobin: 15 g/dL (ref 12.0–15.0)
Potassium: 4 mmol/L (ref 3.5–5.1)
Sodium: 144 mmol/L (ref 135–145)
TCO2: 29 mmol/L (ref 0–100)

## 2014-05-12 LAB — URINALYSIS, ROUTINE W REFLEX MICROSCOPIC
Bilirubin Urine: NEGATIVE
Glucose, UA: 100 mg/dL — AB
Ketones, ur: NEGATIVE mg/dL
Nitrite: NEGATIVE
Protein, ur: NEGATIVE mg/dL
Specific Gravity, Urine: 1.019 (ref 1.005–1.030)
Urobilinogen, UA: 0.2 mg/dL (ref 0.0–1.0)
pH: 6.5 (ref 5.0–8.0)

## 2014-05-12 LAB — COMPREHENSIVE METABOLIC PANEL
ALT: 15 U/L (ref 0–35)
AST: 20 U/L (ref 0–37)
Albumin: 3.2 g/dL — ABNORMAL LOW (ref 3.5–5.2)
Alkaline Phosphatase: 80 U/L (ref 39–117)
Anion gap: 4 — ABNORMAL LOW (ref 5–15)
BUN: 18 mg/dL (ref 6–23)
CO2: 35 mmol/L — ABNORMAL HIGH (ref 19–32)
Calcium: 9.1 mg/dL (ref 8.4–10.5)
Chloride: 104 mmol/L (ref 96–112)
Creatinine, Ser: 1.16 mg/dL — ABNORMAL HIGH (ref 0.50–1.10)
GFR calc Af Amer: 45 mL/min — ABNORMAL LOW (ref >90)
GFR calc non Af Amer: 39 mL/min — ABNORMAL LOW (ref >90)
Glucose, Bld: 199 mg/dL — ABNORMAL HIGH (ref 70–99)
Potassium: 4 mmol/L (ref 3.5–5.1)
Sodium: 143 mmol/L (ref 135–145)
Total Bilirubin: 0.4 mg/dL (ref 0.3–1.2)
Total Protein: 6.9 g/dL (ref 6.0–8.3)

## 2014-05-12 LAB — DIFFERENTIAL
Basophils Absolute: 0 10*3/uL (ref 0.0–0.1)
Basophils Relative: 0 % (ref 0–1)
Eosinophils Absolute: 0.3 10*3/uL (ref 0.0–0.7)
Eosinophils Relative: 4 % (ref 0–5)
Lymphocytes Relative: 32 % (ref 12–46)
Lymphs Abs: 2.1 10*3/uL (ref 0.7–4.0)
Monocytes Absolute: 0.4 10*3/uL (ref 0.1–1.0)
Monocytes Relative: 6 % (ref 3–12)
Neutro Abs: 3.8 10*3/uL (ref 1.7–7.7)
Neutrophils Relative %: 58 % (ref 43–77)

## 2014-05-12 LAB — CBG MONITORING, ED: Glucose-Capillary: 142 mg/dL — ABNORMAL HIGH (ref 70–99)

## 2014-05-12 LAB — APTT: aPTT: 32 seconds (ref 24–37)

## 2014-05-12 LAB — CBC
HCT: 40.5 % (ref 36.0–46.0)
Hemoglobin: 12.5 g/dL (ref 12.0–15.0)
MCH: 28.2 pg (ref 26.0–34.0)
MCHC: 30.9 g/dL (ref 30.0–36.0)
MCV: 91.4 fL (ref 78.0–100.0)
Platelets: 209 10*3/uL (ref 150–400)
RBC: 4.43 MIL/uL (ref 3.87–5.11)
RDW: 16.8 % — ABNORMAL HIGH (ref 11.5–15.5)
WBC: 6.5 10*3/uL (ref 4.0–10.5)

## 2014-05-12 LAB — URINE MICROSCOPIC-ADD ON

## 2014-05-12 LAB — PROTIME-INR
INR: 0.97 (ref 0.00–1.49)
Prothrombin Time: 13 seconds (ref 11.6–15.2)

## 2014-05-12 MED ORDER — CEPHALEXIN 250 MG PO CAPS
500.0000 mg | ORAL_CAPSULE | Freq: Once | ORAL | Status: AC
Start: 2014-05-12 — End: 2014-05-12
  Administered 2014-05-12: 500 mg via ORAL
  Filled 2014-05-12: qty 2

## 2014-05-12 MED ORDER — CEPHALEXIN 500 MG PO CAPS: 500.0000 mg | ORAL_CAPSULE | Freq: Four times a day (QID) | ORAL | Status: DC

## 2014-05-12 NOTE — ED Notes (Signed)
MD Kohut at bedside. 

## 2014-05-12 NOTE — ED Notes (Signed)
Swelling noted to LT hand and fingers. Per daughter, she noticed it today. Radial pulse palpable

## 2014-05-12 NOTE — Discharge Instructions (Signed)

## 2014-05-12 NOTE — ED Notes (Addendum)
Her daughter states she had slurred speech and generalized weakness onset 3 days ago. States she normally could transfer from w/c to toilet but has been unable to this week. Her doctor started  Her on risperidone this month for hallucinations and has been treating her for URI with decongestants also. Pt states she "just feels bad anD her body hurts." she has audible expiratory wheezing in triage. Daughter states she noticed that now her dentures will no longer fit into her mouth after her speech changed. she has had decreased urinary output for past few days. Daughter states her speech seems better today

## 2014-05-12 NOTE — ED Provider Notes (Signed)
CSN: [id number]     Arrival date & time 05/12/14  1910 History   First MD Initiated Contact with Patient 05/12/14 2039     Chief Complaint  Patient presents with  . Stroke Symptoms     (Consider location/radiation/quality/duration/timing/severity/associated sxs/prior Treatment) HPI   81 year old female brought in by daughter for evaluation of generalized weakness. Some slurred speech. Difficulty with transferring to toilet which she can normally do. Increasing hallucinations. PCP recently started on risperidone for this. Also recent URI symptoms. Patient with audible wheezing which is apparently her baseline. Patient states that she does feels generally achy. No fevers or chills. No urinary complaints. No vomiting or diarrhea. Appetite has been okay.  Past Medical History  Diagnosis Date  . UTI (urinary tract infection)   . Hypertension   . Diabetes mellitus   . Gout   . High cholesterol   . Complication of anesthesia   . Diabetic neuropathy   . PVD (peripheral vascular disease)   . Carotid artery occlusion   . Arthritis   . Chronic kidney disease   . GERD (gastroesophageal reflux disease)   . Vertigo   . Venous insufficiency   . Mood disorder   . Dysphagia   . Hemiparesis and alteration of sensations as late effects of stroke 12/09/2013   Past Surgical History  Procedure Laterality Date  . Hemorrhoid banding    . Abdominal hysterectomy    . Cardiac catheterization    . Lower extremity angiogram N/A 05/26/2013    Procedure: LOWER EXTREMITY ANGIOGRAM;  Surgeon: Nada Libman, MD;  Location: [facility] CATH LAB;  Service: Cardiovascular;  Laterality: N/A;   Family History  Problem Relation Age of Onset  . Heart disease Daughter   . Hypertension Daughter   . Diabetes Son   . Heart disease Son   . Diabetes Father   . Diabetes Sister   . Diabetes Sister    History  Substance Use Topics  . Smoking status: Never Smoker   . Smokeless tobacco: Never Used  . Alcohol Use: No    OB History    No data available     Review of Systems  All systems reviewed and negative, other than as noted in HPI.   Allergies  Review of patient's allergies indicates no known allergies.  Home Medications   Prior to Admission medications   Medication Sig Start Date End Date Taking? Authorizing Provider  albuterol (PROVENTIL HFA;VENTOLIN HFA) 108 (90 BASE) MCG/ACT inhaler Inhale 2 puffs into the lungs as needed. 02/28/13 02/28/14  Historical Provider, MD  allopurinol (ZYLOPRIM) 100 MG tablet Take 100 mg by mouth 2 (two) times daily.    Historical Provider, MD  amLODipine (NORVASC) 10 MG tablet Take 1 tablet (10 mg total) by mouth daily. 11/26/11   Catarina Hartshorn, MD  aspirin EC 81 MG EC tablet Take 1 tablet (81 mg total) by mouth daily. 11/26/11   Catarina Hartshorn, MD  atorvastatin (LIPITOR) 20 MG tablet Take 20 mg by mouth at bedtime.    Historical Provider, MD  atorvastatin (LIPITOR) 20 MG tablet TAKE 1 TABLET BY MOUTH EVERY DAY Patient not taking: Reported on 04/28/2014 01/18/14   Margit Hanks, MD  cetirizine (ZYRTEC) 10 MG tablet Take 10 mg by mouth daily.    Historical Provider, MD  donepezil (ARICEPT) 10 MG tablet Take 10 mg by mouth at bedtime.     Historical Provider, MD  furosemide (LASIX) 20 MG tablet Take 20 mg by mouth daily as needed for  fluid.    Historical Provider, MD  gabapentin (NEURONTIN) 300 MG capsule Take 300 mg by mouth 3 (three) times daily.     Historical Provider, MD  glipiZIDE (GLUCOTROL) 5 MG tablet TAKE 1 TABLET BY MOUTH TWICE DAILY 11/05/13   Tiffany L Reed, DO  glycopyrrolate (ROBINUL) 1 MG tablet Take 1 mg by mouth 2 (two) times daily.    Historical Provider, MD  insulin aspart (NOVOLOG) 100 UNIT/ML injection Inject 0-9 Units into the skin every 4 (four) hours. Patient taking differently: Inject 0-9 Units into the skin daily as needed for high blood sugar.  06/02/13   Alison MurrayAlma M Devine, MD  Insulin Lispro Prot & Lispro (HUMALOG MIX 75/25 KWIKPEN) (75-25) 100  UNIT/ML SUPN Inject 20-33 Units into the skin 2 (two) times daily. 33 units in am, 20 units in pm    Historical Provider, MD  sertraline (ZOLOFT) 50 MG tablet Take 50 mg by mouth daily.    Historical Provider, MD  Vortioxetine HBr (BRINTELLIX) 20 MG TABS Take 20 mg by mouth daily.    Historical Provider, MD   There were no vitals taken for this visit. Physical Exam  Constitutional: She appears well-developed and well-nourished. No distress.  HENT:  Head: Normocephalic and atraumatic.  Eyes: Conjunctivae are normal. Right eye exhibits no discharge. Left eye exhibits no discharge.  Neck: Neck supple.  Cardiovascular: Normal rate, regular rhythm and normal heart sounds.  Exam reveals no gallop and no friction rub.   No murmur heard. Pulmonary/Chest: Effort normal. No respiratory distress. She has wheezes.  Abdominal: Soft. She exhibits no distension. There is no tenderness.  Musculoskeletal: She exhibits no edema or tenderness.  Neurological: She is alert. No cranial nerve deficit. She exhibits normal muscle tone. Coordination normal.  Skin: Skin is warm and dry.  Psychiatric: She has a normal mood and affect. Her behavior is normal. Thought content normal.  Nursing note and vitals reviewed.   ED Course  Procedures (including critical care time) Labs Review Labs Reviewed  CBC - Abnormal; Notable for the following:    RDW 16.8 (*)    All other components within normal limits  COMPREHENSIVE METABOLIC PANEL - Abnormal; Notable for the following:    CO2 35 (*)    Glucose, Bld 199 (*)    Creatinine, Ser 1.16 (*)    Albumin 3.2 (*)    GFR calc non Af Amer 39 (*)    GFR calc Af Amer 45 (*)    Anion gap 4 (*)    All other components within normal limits  I-STAT CHEM 8, ED - Abnormal; Notable for the following:    Glucose, Bld 199 (*)    All other components within normal limits  PROTIME-INR  APTT  DIFFERENTIAL  URINALYSIS, ROUTINE W REFLEX MICROSCOPIC  I-STAT TROPOININ, ED  CBG  MONITORING, ED    Imaging Review Ct Head (brain) Wo Contrast  05/12/2014   CLINICAL DATA:  Slurred speech and generalized weakness, onset 3 days ago. Difficulty with transverse. Decreased urinary output. Stroke symptoms.  EXAM: CT HEAD WITHOUT CONTRAST  TECHNIQUE: Contiguous axial images were obtained from the base of the skull through the vertex without intravenous contrast.  COMPARISON:  CT head 12/09/2014.  MRI and MRA brain 05/31/2013  FINDINGS: Diffuse cerebral atrophy. Mild ventricular dilatation consistent with central atrophy. Low-attenuation changes throughout the deep white matter consistent with small vessel ischemia. Old lacunar infarct in the right deep white matter. Probable old cerebellar infarcts. No mass effect or midline  shift. No abnormal extra-axial fluid collections. Gray-white matter junctions are distinct. Basal cisterns are not effaced. No evidence of acute intracranial hemorrhage. Calcification in the basal ganglia. Prominent vascular calcifications in the internal carotid and basilar arteries. Tortuous basilar artery. 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 Mucosal thickening in the paranasal sinuses with partial opacification of the left maxillary antrum. Mastoid air cells are not opacified. Calvarium appears intact.  IMPRESSION: No acute intracranial abnormalities. Chronic atrophy and small vessel ischemic changes. 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 Chronic inflammatory changes in the paranasal sinuses.   Electronically Signed   By: Burman Nieves M.D.   On: 05/12/2014 20:43     EKG Interpretation None      MDM   Final diagnoses:  Wheezing  UTI (lower urinary tract infection)  Generalized weakness        Raeford Razor, MD 05/17/14 1054

## 2014-05-14 ENCOUNTER — Ambulatory Visit: Payer: Medicare HMO

## 2014-05-14 LAB — URINE CULTURE: Colony Count: 45000

## 2014-05-18 ENCOUNTER — Emergency Department (HOSPITAL_COMMUNITY): Payer: Medicare HMO

## 2014-05-18 ENCOUNTER — Encounter (HOSPITAL_COMMUNITY): Payer: Self-pay | Admitting: Adult Health

## 2014-05-18 ENCOUNTER — Ambulatory Visit: Payer: Medicare HMO

## 2014-05-18 ENCOUNTER — Inpatient Hospital Stay (HOSPITAL_COMMUNITY)
Admission: EM | Admit: 2014-05-18 | Discharge: 2014-05-21 | DRG: 193 | Disposition: A | Discharge status: Skilled Nursing Facility | Payer: Medicare HMO | Attending: Internal Medicine | Admitting: Internal Medicine

## 2014-05-18 ENCOUNTER — Other Ambulatory Visit (HOSPITAL_COMMUNITY): Payer: Self-pay

## 2014-05-18 DIAGNOSIS — G8190 Hemiplegia, unspecified affecting unspecified side: Secondary | ICD-10-CM | POA: Diagnosis present

## 2014-05-18 DIAGNOSIS — I872 Venous insufficiency (chronic) (peripheral): Secondary | ICD-10-CM | POA: Diagnosis present

## 2014-05-18 DIAGNOSIS — IMO0002 Reserved for concepts with insufficient information to code with codable children: Secondary | ICD-10-CM | POA: Diagnosis present

## 2014-05-18 DIAGNOSIS — N185 Chronic kidney disease, stage 5: Secondary | ICD-10-CM

## 2014-05-18 DIAGNOSIS — N181 Chronic kidney disease, stage 1: Secondary | ICD-10-CM

## 2014-05-18 DIAGNOSIS — M1990 Unspecified osteoarthritis, unspecified site: Secondary | ICD-10-CM | POA: Diagnosis present

## 2014-05-18 DIAGNOSIS — K219 Gastro-esophageal reflux disease without esophagitis: Secondary | ICD-10-CM | POA: Diagnosis present

## 2014-05-18 DIAGNOSIS — N183 Chronic kidney disease, stage 3 unspecified: Secondary | ICD-10-CM | POA: Diagnosis present

## 2014-05-18 DIAGNOSIS — E780 Pure hypercholesterolemia: Secondary | ICD-10-CM | POA: Diagnosis present

## 2014-05-18 DIAGNOSIS — N184 Chronic kidney disease, stage 4 (severe): Secondary | ICD-10-CM

## 2014-05-18 DIAGNOSIS — E1140 Type 2 diabetes mellitus with diabetic neuropathy, unspecified: Secondary | ICD-10-CM | POA: Diagnosis present

## 2014-05-18 DIAGNOSIS — Z7982 Long term (current) use of aspirin: Secondary | ICD-10-CM | POA: Diagnosis not present

## 2014-05-18 DIAGNOSIS — J189 Pneumonia, unspecified organism: Secondary | ICD-10-CM | POA: Diagnosis not present

## 2014-05-18 DIAGNOSIS — N182 Chronic kidney disease, stage 2 (mild): Secondary | ICD-10-CM

## 2014-05-18 DIAGNOSIS — Z794 Long term (current) use of insulin: Secondary | ICD-10-CM

## 2014-05-18 DIAGNOSIS — N179 Acute kidney failure, unspecified: Secondary | ICD-10-CM | POA: Diagnosis present

## 2014-05-18 DIAGNOSIS — E860 Dehydration: Secondary | ICD-10-CM | POA: Diagnosis present

## 2014-05-18 DIAGNOSIS — R05 Cough: Secondary | ICD-10-CM | POA: Diagnosis present

## 2014-05-18 DIAGNOSIS — M109 Gout, unspecified: Secondary | ICD-10-CM | POA: Diagnosis present

## 2014-05-18 DIAGNOSIS — Z8673 Personal history of transient ischemic attack (TIA), and cerebral infarction without residual deficits: Secondary | ICD-10-CM | POA: Diagnosis not present

## 2014-05-18 DIAGNOSIS — I739 Peripheral vascular disease, unspecified: Secondary | ICD-10-CM | POA: Diagnosis present

## 2014-05-18 DIAGNOSIS — E1165 Type 2 diabetes mellitus with hyperglycemia: Secondary | ICD-10-CM | POA: Diagnosis present

## 2014-05-18 DIAGNOSIS — I129 Hypertensive chronic kidney disease with stage 1 through stage 4 chronic kidney disease, or unspecified chronic kidney disease: Secondary | ICD-10-CM | POA: Diagnosis present

## 2014-05-18 DIAGNOSIS — J9601 Acute respiratory failure with hypoxia: Secondary | ICD-10-CM | POA: Diagnosis present

## 2014-05-18 DIAGNOSIS — N189 Chronic kidney disease, unspecified: Secondary | ICD-10-CM

## 2014-05-18 LAB — CBC
HCT: 39.4 % (ref 36.0–46.0)
HEMOGLOBIN: 12.1 g/dL (ref 12.0–15.0)
MCH: 28.7 pg (ref 26.0–34.0)
MCHC: 30.7 g/dL (ref 30.0–36.0)
MCV: 93.4 fL (ref 78.0–100.0)
Platelets: 211 10*3/uL (ref 150–400)
RBC: 4.22 MIL/uL (ref 3.87–5.11)
RDW: 17.4 % — ABNORMAL HIGH (ref 11.5–15.5)
WBC: 11.4 10*3/uL — AB (ref 4.0–10.5)

## 2014-05-18 LAB — URINALYSIS, ROUTINE W REFLEX MICROSCOPIC
Bilirubin Urine: NEGATIVE
Glucose, UA: NEGATIVE mg/dL
Hgb urine dipstick: NEGATIVE
Ketones, ur: NEGATIVE mg/dL
Nitrite: NEGATIVE
PH: 7.5 (ref 5.0–8.0)
Protein, ur: NEGATIVE mg/dL
Specific Gravity, Urine: 1.02 (ref 1.005–1.030)
UROBILINOGEN UA: 0.2 mg/dL (ref 0.0–1.0)

## 2014-05-18 LAB — BASIC METABOLIC PANEL
Anion gap: 4 — ABNORMAL LOW (ref 5–15)
BUN: 24 mg/dL — AB (ref 6–23)
CHLORIDE: 104 mmol/L (ref 96–112)
CO2: 37 mmol/L — ABNORMAL HIGH (ref 19–32)
Calcium: 8.6 mg/dL (ref 8.4–10.5)
Creatinine, Ser: 1.36 mg/dL — ABNORMAL HIGH (ref 0.50–1.10)
GFR, EST AFRICAN AMERICAN: 37 mL/min — AB (ref >90)
GFR, EST NON AFRICAN AMERICAN: 32 mL/min — AB (ref >90)
Glucose, Bld: 161 mg/dL — ABNORMAL HIGH (ref 70–99)
POTASSIUM: 3.9 mmol/L (ref 3.5–5.1)
SODIUM: 145 mmol/L (ref 135–145)

## 2014-05-18 LAB — URINE MICROSCOPIC-ADD ON

## 2014-05-18 LAB — BRAIN NATRIURETIC PEPTIDE: B Natriuretic Peptide: 83.8 pg/mL (ref 0.0–100.0)

## 2014-05-18 LAB — I-STAT TROPONIN, ED: TROPONIN I, POC: 0.01 ng/mL (ref 0.00–0.08)

## 2014-05-18 LAB — GLUCOSE, CAPILLARY: GLUCOSE-CAPILLARY: 128 mg/dL — AB (ref 70–99)

## 2014-05-18 MED ORDER — VITAMIN B-1 100 MG PO TABS
100.0000 mg | ORAL_TABLET | Freq: Every day | ORAL | Status: DC
  Administered 2014-05-19 – 2014-05-21 (×3): 100 mg via ORAL
  Filled 2014-05-18 (×3): qty 1

## 2014-05-18 MED ORDER — LORATADINE 10 MG PO TABS
10.0000 mg | ORAL_TABLET | Freq: Every day | ORAL | Status: DC
  Administered 2014-05-19 – 2014-05-21 (×3): 10 mg via ORAL
  Filled 2014-05-18 (×3): qty 1

## 2014-05-18 MED ORDER — ONDANSETRON HCL 4 MG PO TABS: 4.0000 mg | ORAL_TABLET | Freq: Four times a day (QID) | ORAL | Status: DC | PRN

## 2014-05-18 MED ORDER — IPRATROPIUM BROMIDE 0.02 % IN SOLN: 0.5000 mg | Freq: Four times a day (QID) | RESPIRATORY_TRACT | Status: DC

## 2014-05-18 MED ORDER — INSULIN ASPART PROT & ASPART (70-30 MIX) 100 UNIT/ML ~~LOC~~ SUSP
33.0000 [IU] | Freq: Every day | SUBCUTANEOUS | Status: DC
  Administered 2014-05-19: 33 [IU] via SUBCUTANEOUS
  Filled 2014-05-18: qty 10

## 2014-05-18 MED ORDER — ASPIRIN EC 81 MG PO TBEC
81.0000 mg | DELAYED_RELEASE_TABLET | Freq: Every day | ORAL | Status: DC
  Administered 2014-05-19 – 2014-05-21 (×3): 81 mg via ORAL
  Filled 2014-05-18 (×3): qty 1

## 2014-05-18 MED ORDER — CEFTRIAXONE SODIUM 1 G IJ SOLR
1.0000 g | Freq: Once | INTRAMUSCULAR | Status: AC
  Administered 2014-05-18: 1 g via INTRAVENOUS
  Filled 2014-05-18: qty 10

## 2014-05-18 MED ORDER — ALLOPURINOL 100 MG PO TABS
100.0000 mg | ORAL_TABLET | Freq: Two times a day (BID) | ORAL | Status: DC
  Administered 2014-05-19 – 2014-05-21 (×5): 100 mg via ORAL
  Filled 2014-05-18 (×7): qty 1

## 2014-05-18 MED ORDER — INFLUENZA VAC SPLIT QUAD 0.5 ML IM SUSY
0.5000 mL | PREFILLED_SYRINGE | INTRAMUSCULAR | Status: DC
  Filled 2014-05-18: qty 0.5

## 2014-05-18 MED ORDER — INSULIN ASPART 100 UNIT/ML ~~LOC~~ SOLN
0.0000 [IU] | Freq: Three times a day (TID) | SUBCUTANEOUS | Status: DC
  Administered 2014-05-20: 5 [IU] via SUBCUTANEOUS
  Administered 2014-05-21: 2 [IU] via SUBCUTANEOUS
  Administered 2014-05-21: 5 [IU] via SUBCUTANEOUS

## 2014-05-18 MED ORDER — SODIUM CHLORIDE 0.9 % IJ SOLN
3.0000 mL | Freq: Two times a day (BID) | INTRAMUSCULAR | Status: DC
  Administered 2014-05-19 (×2): 3 mL via INTRAVENOUS

## 2014-05-18 MED ORDER — ALBUTEROL SULFATE (2.5 MG/3ML) 0.083% IN NEBU: 2.5000 mg | INHALATION_SOLUTION | Freq: Four times a day (QID) | RESPIRATORY_TRACT | Status: DC

## 2014-05-18 MED ORDER — SODIUM CHLORIDE 0.9 % IV SOLN
INTRAVENOUS | Status: DC
  Administered 2014-05-19: via INTRAVENOUS

## 2014-05-18 MED ORDER — DONEPEZIL HCL 10 MG PO TABS
10.0000 mg | ORAL_TABLET | Freq: Every day | ORAL | Status: DC
  Administered 2014-05-19 – 2014-05-20 (×2): 10 mg via ORAL
  Filled 2014-05-18 (×4): qty 1

## 2014-05-18 MED ORDER — FOLIC ACID 1 MG PO TABS
1.0000 mg | ORAL_TABLET | Freq: Every day | ORAL | Status: DC
  Administered 2014-05-19 – 2014-05-21 (×3): 1 mg via ORAL
  Filled 2014-05-18 (×3): qty 1

## 2014-05-18 MED ORDER — RISPERIDONE 0.5 MG PO TABS
0.5000 mg | ORAL_TABLET | Freq: Two times a day (BID) | ORAL | Status: DC
  Administered 2014-05-19 – 2014-05-21 (×5): 0.5 mg via ORAL
  Filled 2014-05-18 (×7): qty 1

## 2014-05-18 MED ORDER — INSULIN ASPART PROT & ASPART (70-30 MIX) 100 UNIT/ML ~~LOC~~ SUSP: 20.0000 [IU] | Freq: Every day | SUBCUTANEOUS | Status: DC

## 2014-05-18 MED ORDER — CEFTRIAXONE SODIUM IN DEXTROSE 20 MG/ML IV SOLN
1.0000 g | INTRAVENOUS | Status: DC
  Administered 2014-05-19: 1 g via INTRAVENOUS
  Filled 2014-05-18 (×2): qty 50

## 2014-05-18 MED ORDER — GABAPENTIN 300 MG PO CAPS
300.0000 mg | ORAL_CAPSULE | Freq: Three times a day (TID) | ORAL | Status: DC
  Administered 2014-05-19 – 2014-05-21 (×7): 300 mg via ORAL
  Filled 2014-05-18 (×10): qty 1

## 2014-05-18 MED ORDER — ONDANSETRON HCL 4 MG/2ML IJ SOLN: 4.0000 mg | Freq: Four times a day (QID) | INTRAMUSCULAR | Status: DC | PRN

## 2014-05-18 MED ORDER — INSULIN ASPART 100 UNIT/ML ~~LOC~~ SOLN: 0.0000 [IU] | Freq: Three times a day (TID) | SUBCUTANEOUS | Status: DC

## 2014-05-18 MED ORDER — ADULT MULTIVITAMIN W/MINERALS CH
1.0000 | ORAL_TABLET | Freq: Every day | ORAL | Status: DC
  Administered 2014-05-19 – 2014-05-21 (×3): 1 via ORAL
  Filled 2014-05-18 (×3): qty 1

## 2014-05-18 MED ORDER — ACETAMINOPHEN 325 MG PO TABS: 650.0000 mg | ORAL_TABLET | Freq: Four times a day (QID) | ORAL | Status: DC | PRN

## 2014-05-18 MED ORDER — ATORVASTATIN CALCIUM 20 MG PO TABS
20.0000 mg | ORAL_TABLET | Freq: Every day | ORAL | Status: DC
  Administered 2014-05-19 – 2014-05-21 (×3): 20 mg via ORAL
  Filled 2014-05-18 (×3): qty 1

## 2014-05-18 MED ORDER — GLIPIZIDE 5 MG PO TABS
5.0000 mg | ORAL_TABLET | Freq: Two times a day (BID) | ORAL | Status: DC
  Filled 2014-05-18 (×5): qty 1

## 2014-05-18 MED ORDER — HEPARIN SODIUM (PORCINE) 5000 UNIT/ML IJ SOLN
5000.0000 [IU] | Freq: Three times a day (TID) | INTRAMUSCULAR | Status: DC
  Administered 2014-05-19 – 2014-05-21 (×9): 5000 [IU] via SUBCUTANEOUS
  Filled 2014-05-18 (×10): qty 1

## 2014-05-18 MED ORDER — DEXTROSE 5 % IV SOLN: 500.0000 mg | Freq: Once | INTRAVENOUS | Status: AC

## 2014-05-18 MED ORDER — FUROSEMIDE 20 MG PO TABS
20.0000 mg | ORAL_TABLET | Freq: Every day | ORAL | Status: DC | PRN
  Filled 2014-05-18: qty 1

## 2014-05-18 MED ORDER — SERTRALINE HCL 50 MG PO TABS
50.0000 mg | ORAL_TABLET | Freq: Every day | ORAL | Status: DC
  Administered 2014-05-19 – 2014-05-21 (×3): 50 mg via ORAL
  Filled 2014-05-18 (×3): qty 1

## 2014-05-18 MED ORDER — DEXTROSE 5 % IV SOLN
500.0000 mg | INTRAVENOUS | Status: DC
  Administered 2014-05-19 – 2014-05-20 (×2): 500 mg via INTRAVENOUS
  Filled 2014-05-18 (×3): qty 500

## 2014-05-18 MED ORDER — ACETAMINOPHEN 650 MG RE SUPP: 650.0000 mg | Freq: Four times a day (QID) | RECTAL | Status: DC | PRN

## 2014-05-18 MED ORDER — AMLODIPINE BESYLATE 10 MG PO TABS
10.0000 mg | ORAL_TABLET | Freq: Every day | ORAL | Status: DC
  Administered 2014-05-19 – 2014-05-21 (×3): 10 mg via ORAL
  Filled 2014-05-18 (×3): qty 1

## 2014-05-18 NOTE — ED Provider Notes (Signed)
CSN: [id number]     Arrival date & time 05/18/14  1857 History   First MD Initiated Contact with Patient 05/18/14 1957     Chief Complaint  Patient presents with  . Weakness  . Edema  . Shortness of Breath     (Consider location/radiation/quality/duration/timing/severity/associated sxs/prior Treatment) Patient is a 81 y.o. female presenting with cough.  Cough Cough characteristics:  Productive Sputum characteristics:  Pink Severity:  Moderate Onset quality:  Gradual Duration:  5 days Timing:  Constant Progression:  Worsening Chronicity:  Recurrent Context: not upper respiratory infection   Relieved by:  Nothing Worsened by:  Nothing tried Ineffective treatments:  None tried Associated symptoms: shortness of breath   Associated symptoms: no chest pain and no fever     Past Medical History  Diagnosis Date  . UTI (urinary tract infection)   . Hypertension   . Diabetes mellitus   . Gout   . High cholesterol   . Complication of anesthesia   . Diabetic neuropathy   . PVD (peripheral vascular disease)   . Carotid artery occlusion   . Arthritis   . Chronic kidney disease   . GERD (gastroesophageal reflux disease)   . Vertigo   . Venous insufficiency   . Mood disorder   . Dysphagia   . Hemiparesis and alteration of sensations as late effects of stroke 12/09/2013   Past Surgical History  Procedure Laterality Date  . Hemorrhoid banding    . Abdominal hysterectomy    . Cardiac catheterization    . Lower extremity angiogram N/A 05/26/2013    Procedure: LOWER EXTREMITY ANGIOGRAM;  Surgeon: Nada Libman, MD;  Location: [facility] CATH LAB;  Service: Cardiovascular;  Laterality: N/A;   Family History  Problem Relation Age of Onset  . Heart disease Daughter   . Hypertension Daughter   . Diabetes Son   . Heart disease Son   . Diabetes Father   . Diabetes Sister   . Diabetes Sister    History  Substance Use Topics  . Smoking status: Never Smoker   . Smokeless tobacco:  Never Used  . Alcohol Use: No   OB History    No data available     Review of Systems  Constitutional: Negative for fever.  Respiratory: Positive for cough and shortness of breath.   Cardiovascular: Negative for chest pain.  All other systems reviewed and are negative.     Allergies  Review of patient's allergies indicates no known allergies.  Home Medications   Prior to Admission medications   Medication Sig Start Date End Date Taking? Authorizing Provider  albuterol (PROVENTIL HFA;VENTOLIN HFA) 108 (90 BASE) MCG/ACT inhaler Inhale 2 puffs into the lungs every 6 (six) hours as needed for wheezing or shortness of breath.   Yes Historical Provider, MD  allopurinol (ZYLOPRIM) 100 MG tablet Take 100 mg by mouth 2 (two) times daily.   Yes Historical Provider, MD  amLODipine (NORVASC) 10 MG tablet Take 1 tablet (10 mg total) by mouth daily. 11/26/11  Yes Catarina Hartshorn, MD  aspirin EC 81 MG EC tablet Take 1 tablet (81 mg total) by mouth daily. 11/26/11  Yes Catarina Hartshorn, MD  atorvastatin (LIPITOR) 20 MG tablet TAKE 1 TABLET BY MOUTH EVERY DAY 01/18/14  Yes Margit Hanks, MD  cephALEXin (KEFLEX) 500 MG capsule Take 1 capsule (500 mg total) by mouth 4 (four) times daily. 05/12/14  Yes Raeford Razor, MD  cetirizine (ZYRTEC) 10 MG tablet Take 10 mg by mouth daily.  Yes Historical Provider, MD  dextromethorphan-guaiFENesin (MUCINEX DM) 30-600 MG per 12 hr tablet Take 1 tablet by mouth 2 (two) times daily as needed for cough.   Yes Historical Provider, MD  donepezil (ARICEPT) 10 MG tablet Take 10 mg by mouth at bedtime.    Yes Historical Provider, MD  furosemide (LASIX) 20 MG tablet Take 20 mg by mouth daily as needed for fluid.   Yes Historical Provider, MD  gabapentin (NEURONTIN) 300 MG capsule Take 300 mg by mouth 3 (three) times daily.    Yes Historical Provider, MD  glipiZIDE (GLUCOTROL) 5 MG tablet TAKE 1 TABLET BY MOUTH TWICE DAILY 11/05/13  Yes Tiffany L Reed, DO  HYDROcodone-acetaminophen  (NORCO) 10-325 MG per tablet Take 1 tablet by mouth every 6 (six) hours as needed for moderate pain.   Yes Historical Provider, MD  insulin aspart (NOVOLOG) 100 UNIT/ML injection Inject 0-9 Units into the skin every 4 (four) hours. Patient taking differently: Inject 0-9 Units into the skin daily as needed for high blood sugar.  06/02/13  Yes Alison Murray, MD  Insulin Lispro Prot & Lispro (HUMALOG MIX 75/25 KWIKPEN) (75-25) 100 UNIT/ML SUPN Inject 20-33 Units into the skin 2 (two) times daily. 33 units in am, 20 units in pm   Yes Historical Provider, MD  risperiDONE (RISPERDAL) 0.5 MG tablet Take 0.5 mg by mouth 2 (two) times daily as needed. For hallucinations   Yes Historical Provider, MD  sertraline (ZOLOFT) 50 MG tablet Take 50 mg by mouth daily.   Yes Historical Provider, MD  albuterol (PROVENTIL HFA;VENTOLIN HFA) 108 (90 BASE) MCG/ACT inhaler Inhale 2 puffs into the lungs as needed. 02/28/13 02/28/14  Historical Provider, MD  atorvastatin (LIPITOR) 20 MG tablet Take 20 mg by mouth at bedtime.    Historical Provider, MD   BP 152/76 mmHg  Pulse 71  Temp(Src) 98.2 F (36.8 C) (Oral)  Resp 18  Ht  (1.626 m)  Wt 154 lb 1.6 oz (69.9 kg)  BMI 26.44 kg/m2  SpO2 100% Physical Exam  Constitutional: She is oriented to person, place, and time. She appears well-developed and well-nourished.  HENT:  Head: Normocephalic and atraumatic.  Right Ear: External ear normal.  Left Ear: External ear normal.  Eyes: Conjunctivae and EOM are normal. Pupils are equal, round, and reactive to light.  Neck: Normal range of motion. Neck supple.  Cardiovascular: Normal rate, regular rhythm, normal heart sounds and intact distal pulses.   Pulmonary/Chest: Effort normal and breath sounds normal.  Abdominal: Soft. Bowel sounds are normal. There is no tenderness.  Musculoskeletal: Normal range of motion.  Neurological: She is alert and oriented to person, place, and time.  Skin: Skin is warm and dry.  Vitals  reviewed.   ED Course  Procedures (including critical care time) Labs Review Labs Reviewed  CBC - Abnormal; Notable for the following:    WBC 11.4 (*)    RDW 17.4 (*)    All other components within normal limits  BASIC METABOLIC PANEL - Abnormal; Notable for the following:    CO2 37 (*)    Glucose, Bld 161 (*)    BUN 24 (*)    Creatinine, Ser 1.36 (*)    GFR calc non Af Amer 32 (*)    GFR calc Af Amer 37 (*)    Anion gap 4 (*)    All other components within normal limits  URINALYSIS, ROUTINE W REFLEX MICROSCOPIC - Abnormal; Notable for the following:    APPearance  CLOUDY (*)    Leukocytes, UA SMALL (*)    All other components within normal limits  URINE MICROSCOPIC-ADD ON - Abnormal; Notable for the following:    Squamous Epithelial / LPF FEW (*)    Crystals CA OXALATE CRYSTALS (*)    All other components within normal limits  GLUCOSE, CAPILLARY - Abnormal; Notable for the following:    Glucose-Capillary 128 (*)    All other components within normal limits  CULTURE, BLOOD (ROUTINE X 2)  CULTURE, BLOOD (ROUTINE X 2)  CULTURE, BLOOD (ROUTINE X 2)  CULTURE, BLOOD (ROUTINE X 2)  CULTURE, EXPECTORATED SPUTUM-ASSESSMENT  GRAM STAIN  BRAIN NATRIURETIC PEPTIDE  HIV ANTIBODY (ROUTINE TESTING)  LEGIONELLA ANTIGEN, URINE  STREP PNEUMONIAE URINARY ANTIGEN  CBC  CREATININE, SERUM  TSH  HEMOGLOBIN A1C  COMPREHENSIVE METABOLIC PANEL  CBC  I-STAT TROPOININ, ED    Imaging Review Dg Chest Port 1 View  05/18/2014   CLINICAL DATA:  Presents with 5 days of functional decline-unable to stand, bilateral leg swellng, pink tinged sputum and cough, slurred speech in the evenings, lack of appetite and fatigue.  EXAM: PORTABLE CHEST - 1 VIEW  COMPARISON:  05/12/2014 and 01/27/2014  FINDINGS: Lungs are adequately inflated demonstrate mild bibasilar opacification left worse than right as cannot exclude infection. Stable cardiomegaly. Stable prominence of the aortic arch with tortuosity of the  thoracic aorta. Remainder of the exam is unchanged.  IMPRESSION: Bibasilar opacification left worse than right which may represent a pneumonia.  Stable cardiomegaly and stable prominence of the aortic arch.   Electronically Signed   By: Elberta Fortisaniel  Boyle M.D.   On: 05/18/2014 20:56     EKG Interpretation   Date/Time:  Tuesday May 18 2014 19:44:56 EDT Ventricular Rate:  73 PR Interval:  182 QRS Duration: 110 QT Interval:  396 QTC Calculation: 436 R Axis:   158 Text Interpretation:  Normal sinus rhythm with sinus arrhythmia Right axis  deviation Nonspecific ST and T wave abnormality Abnormal ECG No  significant change since last tracing Confirmed by Mirian MoGentry, Matthew [phone]([id number])  on 05/18/2014 8:03:18 PM      MDM   Final diagnoses:  None    81 y.o. female with pertinent PMH of recent UTI, CVA, DM, Carotid atherosclerosis presents with malaise, cough.  Being treated currently for UTI.  On arrival vitals and physical exam as above, patient hypoxic, requiring between 2 and 4 L of oxygen. Chest x-ray with pneumonia. Consulted medicine for admission after antibiotics Rocephin/azithro, CAP.    I have reviewed all laboratory and imaging studies if ordered as above   CAP     Mirian MoMatthew Gentry, MD 05/19/14 0000

## 2014-05-18 NOTE — H&P (Signed)
[facility] History and Physical  Tameshia Bonneville RUE:[id number] DOB: 12/06/1917 DOA: 05/18/2014  Referring physician: Mirian Mo, MD PCP: [name], MD   Chief Complaint: Pneumonia  HPI: [name] is a 81 y.o. female presents with a decline in status. The patients daughter states that she has not been feeling very well for about five days. Patient has had cough and some mucus production. Patient has had some drooling also. The daughter states that she had a stroke and since then she has had slurred speech but otherwise she is relatively functional. She has noted that she has had more shortness of breath. Patient has had a recent UTI and was on treatment for this. Patients daughter states that she has a major concern for her as there are periods during the day when she is alone in the house. On presentation to the ED she is somewhat somnolent though will converse on occasion and she was noted to be hypoxic. On her CXR there is some increased infiltrate suggestive of pneumonia.   Review of Systems:  Constitutional:  No weight loss, night sweats, +fatigue.  HEENT:  No headaches, +Difficulty swallowing No sneezing, itching, ear ache, nasal congestion, post nasal drip,  Cardio-vascular:  No chest pain, Orthopnea, PND, dizziness, palpitations  GI:  No heartburn, indigestion, abdominal pain, +nausea, +vomiting, no diarrhea  Resp:  +shortness of breath +excess mucus, No coughing up of blood  Skin:  no rash or lesions GU:  +dysuria, +change in color of urine.  Musculoskeletal:  No joint pain or swelling. No decreased range of motion  Psych:  No change in mood or affect. No depression or anxiety   Past Medical History  Diagnosis Date  . UTI (urinary tract infection)   . Hypertension   . Diabetes mellitus   . Gout   . High cholesterol   . Complication of anesthesia   . Diabetic neuropathy   . PVD (peripheral vascular disease)   . Carotid artery occlusion   .  Arthritis   . Chronic kidney disease   . GERD (gastroesophageal reflux disease)   . Vertigo   . Venous insufficiency   . Mood disorder   . Dysphagia   . Hemiparesis and alteration of sensations as late effects of stroke 12/09/2013   Past Surgical History  Procedure Laterality Date  . Hemorrhoid banding    . Abdominal hysterectomy    . Cardiac catheterization    . Lower extremity angiogram N/A 05/26/2013    Procedure: LOWER EXTREMITY ANGIOGRAM;  Surgeon: Nada Libman, MD;  Location: [facility] CATH LAB;  Service: Cardiovascular;  Laterality: N/A;   Social History:  reports that she has never smoked. She has never used smokeless tobacco. She reports that she does not drink alcohol or use illicit drugs.  No Known Allergies  Family History  Problem Relation Age of Onset  . Heart disease Daughter   . Hypertension Daughter   . Diabetes Son   . Heart disease Son   . Diabetes Father   . Diabetes Sister   . Diabetes Sister      Prior to Admission medications   Medication Sig Start Date End Date Taking? Authorizing Provider  albuterol (PROVENTIL HFA;VENTOLIN HFA) 108 (90 BASE) MCG/ACT inhaler Inhale 2 puffs into the lungs every 6 (six) hours as needed for wheezing or shortness of breath.   Yes Historical Provider, MD  allopurinol (ZYLOPRIM) 100 MG tablet Take 100 mg by mouth 2 (two) times daily.   Yes Historical Provider, MD  amLODipine (NORVASC) 10 MG tablet Take 1 tablet (10 mg total) by mouth daily. 11/26/11  Yes Catarina Hartshorn, MD  aspirin EC 81 MG EC tablet Take 1 tablet (81 mg total) by mouth daily. 11/26/11  Yes Catarina Hartshorn, MD  atorvastatin (LIPITOR) 20 MG tablet TAKE 1 TABLET BY MOUTH EVERY DAY 01/18/14  Yes Margit Hanks, MD  cephALEXin (KEFLEX) 500 MG capsule Take 1 capsule (500 mg total) by mouth 4 (four) times daily. 05/12/14  Yes Raeford Razor, MD  cetirizine (ZYRTEC) 10 MG tablet Take 10 mg by mouth daily.   Yes Historical Provider, MD  dextromethorphan-guaiFENesin (MUCINEX DM) 30-600  MG per 12 hr tablet Take 1 tablet by mouth 2 (two) times daily as needed for cough.   Yes Historical Provider, MD  donepezil (ARICEPT) 10 MG tablet Take 10 mg by mouth at bedtime.    Yes Historical Provider, MD  furosemide (LASIX) 20 MG tablet Take 20 mg by mouth daily as needed for fluid.   Yes Historical Provider, MD  gabapentin (NEURONTIN) 300 MG capsule Take 300 mg by mouth 3 (three) times daily.    Yes Historical Provider, MD  glipiZIDE (GLUCOTROL) 5 MG tablet TAKE 1 TABLET BY MOUTH TWICE DAILY 11/05/13  Yes Tiffany L Reed, DO  HYDROcodone-acetaminophen (NORCO) 10-325 MG per tablet Take 1 tablet by mouth every 6 (six) hours as needed for moderate pain.   Yes Historical Provider, MD  insulin aspart (NOVOLOG) 100 UNIT/ML injection Inject 0-9 Units into the skin every 4 (four) hours. Patient taking differently: Inject 0-9 Units into the skin daily as needed for high blood sugar.  06/02/13  Yes Alison Murray, MD  Insulin Lispro Prot & Lispro (HUMALOG MIX 75/25 KWIKPEN) (75-25) 100 UNIT/ML SUPN Inject 20-33 Units into the skin 2 (two) times daily. 33 units in am, 20 units in pm   Yes Historical Provider, MD  risperiDONE (RISPERDAL) 0.5 MG tablet Take 0.5 mg by mouth 2 (two) times daily as needed. For hallucinations   Yes Historical Provider, MD  sertraline (ZOLOFT) 50 MG tablet Take 50 mg by mouth daily.   Yes Historical Provider, MD  albuterol (PROVENTIL HFA;VENTOLIN HFA) 108 (90 BASE) MCG/ACT inhaler Inhale 2 puffs into the lungs as needed. 02/28/13 02/28/14  Historical Provider, MD  atorvastatin (LIPITOR) 20 MG tablet Take 20 mg by mouth at bedtime.    Historical Provider, MD   Physical Exam: Filed Vitals:   05/18/14 1933 05/18/14 2022 05/18/14 2115 05/18/14 2121  BP:  155/74 146/72 146/72  Pulse:  71 69 64  Temp:      TempSrc:      Resp:  SpO2: 90% 95% 94% 93%    Wt Readings from Last 3 Encounters:  12/09/13 64.411 kg (142 lb)  08/17/13 62.596 kg (138 lb)  08/03/13 62.596 kg  (138 lb)    General:  Appears comfortable Eyes:  normal lids, irises & conjunctiva ENT: left facial droop Neck: no LAD, masses or thyromegaly Cardiovascular: RRR, no m/r/g. No LE edema Respiratory: Normal respiratory effort. Scattered wheeze noted bilaterally Abdomen: soft, ntnd Skin: no rash or induration seen on limited exam Musculoskeletal: grossly normal tone BUE/BLE          Labs on Admission:  Basic Metabolic Panel:  Recent Labs Lab 05/12/14 2019 05/12/14 2026 05/18/14 2056  NA 143 144 145  K 4.0 4.0 3.9  CL 104 101 104  CO2 35*  --  37*  GLUCOSE 199* 199* 161*  BUN 18 21 24*  CREATININE 1.16* 1.10 1.36*  CALCIUM 9.1  --  8.6   Liver Function Tests:  Recent Labs Lab 05/12/14 2019  AST 20  ALT 15  ALKPHOS 80  BILITOT 0.4  PROT 6.9  ALBUMIN 3.2*   No results for input(s): LIPASE, AMYLASE in the last 168 hours. No results for input(s): AMMONIA in the last 168 hours. CBC:  Recent Labs Lab 05/12/14 2019 05/12/14 2026 05/18/14 2056  WBC 6.5  --  11.4*  NEUTROABS 3.8  --   --   HGB 12.5 15.0 12.1  HCT 40.5 44.0 39.4  MCV 91.4  --  93.4  PLT 209  --  211   Cardiac Enzymes: No results for input(s): CKTOTAL, CKMB, CKMBINDEX, TROPONINI in the last 168 hours.  BNP (last 3 results) No results for input(s): BNP in the last 8760 hours.  ProBNP (last 3 results) No results for input(s): PROBNP in the last 8760 hours.  CBG:  Recent Labs Lab 05/12/14 2126  GLUCAP 142*    Radiological Exams on Admission: Dg Chest Port 1 View  05/18/2014   CLINICAL DATA:  Presents with 5 days of functional decline-unable to stand, bilateral leg swellng, pink tinged sputum and cough, slurred speech in the evenings, lack of appetite and fatigue.  EXAM: PORTABLE CHEST - 1 VIEW  COMPARISON:  05/12/2014 and 01/27/2014  FINDINGS: Lungs are adequately inflated demonstrate mild bibasilar opacification left worse than right as cannot exclude infection. Stable cardiomegaly.  Stable prominence of the aortic arch with tortuosity of the thoracic aorta. Remainder of the exam is unchanged.  IMPRESSION: Bibasilar opacification left worse than right which may represent a pneumonia.  Stable cardiomegaly and stable prominence of the aortic arch.   Electronically Signed   By: Elberta Fortisaniel  Boyle M.D.   On: 05/18/2014 20:56     Assessment/Plan Active Problems:   CKD (chronic kidney disease), stage III   DM type 2, uncontrolled, with neuropathy   Hypertensive renal disease   Community acquired pneumonia   Acute respiratory failure with hypoxemia   CAP (community acquired pneumonia)   1. Acute Hypoxic Respiratory failure -will start on oxygen therapy -monitor saturations closely -currently she is a full code  2. CAP -started on rocephin and azithromycin -will check sputum cultures -will check blood cultures  3. Type 2 Diabetes Mellitus with neuropathy -will monitor FSBS -insulin coverage as needed  4. Hypertensive renal disease -monitor pressures -will resume antihypertensives  5. Acute on Chronic renal failure -likely dehydrated -will gently hydrate -monitor renal function  6. Social Issues -will need placement -will get social work consult  Code Status: Full Code (must indicate code status--if unknown or must be presumed, indicate so) DVT Prophylaxis:heparin Family Communication: Daughter (indicate person spoken with, if applicable, with phone number if by telephone) Disposition Plan: SNF (indicate anticipated LOS)  Time spent: 70min  [facility] LLCKHAN,SAADAT A [facility] Pager [phone]

## 2014-05-18 NOTE — ED Notes (Addendum)
Presents with 5 days of functional decline-unable to stand, bilateral leg swellng, pink tinged sputum and cough, slurred speech in the evenings, lack of appetite and fatigue. SAts on RA 83%, endorses SOB, breath sound rhales, family endorses hallucinations and recent UTI. Hypertensive 180/86. Pt is alert. Lives at home with daughter-daughter is concerned because she feels she need to be placed in a rehab facility.

## 2014-05-18 NOTE — Progress Notes (Signed)
New Admission Note:   Arrival Method: Stretcher from ED Mental Orientation: alert and oriented to person Telemetry: Box #15 Assessment: Completed Skin:See docflowsheet IV:R Hand Pain: N/A Tubes:N/A Safety Measures: Safety Fall Prevention Plan has been given, discussed  Admission: Completed [address] Orientation: Patient has been orientated to the room, unit and staff.  Family:Daughter at bedside,updated on plans of care.  Orders have been reviewed and implemented. Will continue to monitor the patient. Call light has been placed within reach and bed alarm has been activated.   Toll BrothersCharito Alante Tolan BSN, RN-BC Phone number: [phone]

## 2014-05-19 ENCOUNTER — Inpatient Hospital Stay (HOSPITAL_COMMUNITY): Payer: Medicare HMO

## 2014-05-19 LAB — CBC
HCT: 34.7 % — ABNORMAL LOW (ref 36.0–46.0)
HCT: 34.7 % — ABNORMAL LOW (ref 36.0–46.0)
HEMOGLOBIN: 10.4 g/dL — AB (ref 12.0–15.0)
Hemoglobin: 10.6 g/dL — ABNORMAL LOW (ref 12.0–15.0)
MCH: 28.6 pg (ref 26.0–34.0)
MCH: 28.1 pg (ref 26.0–34.0)
MCHC: 30.5 g/dL (ref 30.0–36.0)
MCHC: 30 g/dL (ref 30.0–36.0)
MCV: 93.8 fL (ref 78.0–100.0)
MCV: 93.5 fL (ref 78.0–100.0)
PLATELETS: 188 10*3/uL (ref 150–400)
PLATELETS: 201 10*3/uL (ref 150–400)
RBC: 3.7 MIL/uL — AB (ref 3.87–5.11)
RBC: 3.71 MIL/uL — AB (ref 3.87–5.11)
RDW: 17.3 % — ABNORMAL HIGH (ref 11.5–15.5)
RDW: 17.6 % — ABNORMAL HIGH (ref 11.5–15.5)
WBC: 8.3 10*3/uL (ref 4.0–10.5)
WBC: 9.6 10*3/uL (ref 4.0–10.5)

## 2014-05-19 LAB — COMPREHENSIVE METABOLIC PANEL
ALT: 15 U/L (ref 0–35)
AST: 20 U/L (ref 0–37)
Albumin: 2.6 g/dL — ABNORMAL LOW (ref 3.5–5.2)
Alkaline Phosphatase: 62 U/L (ref 39–117)
Anion gap: 5 (ref 5–15)
BUN: 24 mg/dL — ABNORMAL HIGH (ref 6–23)
CALCIUM: 8.2 mg/dL — AB (ref 8.4–10.5)
CO2: 34 mmol/L — AB (ref 19–32)
Chloride: 106 mmol/L (ref 96–112)
Creatinine, Ser: 1.29 mg/dL — ABNORMAL HIGH (ref 0.50–1.10)
GFR, EST AFRICAN AMERICAN: 40 mL/min — AB (ref >90)
GFR, EST NON AFRICAN AMERICAN: 34 mL/min — AB (ref >90)
GLUCOSE: 96 mg/dL (ref 70–99)
POTASSIUM: 3.8 mmol/L (ref 3.5–5.1)
SODIUM: 145 mmol/L (ref 135–145)
Total Bilirubin: 0.4 mg/dL (ref 0.3–1.2)
Total Protein: 5.4 g/dL — ABNORMAL LOW (ref 6.0–8.3)

## 2014-05-19 LAB — GLUCOSE, CAPILLARY
GLUCOSE-CAPILLARY: 61 mg/dL — AB (ref 70–99)
GLUCOSE-CAPILLARY: 62 mg/dL — AB (ref 70–99)
Glucose-Capillary: 89 mg/dL (ref 70–99)
Glucose-Capillary: 99 mg/dL (ref 70–99)
Glucose-Capillary: 66 mg/dL — ABNORMAL LOW (ref 70–99)

## 2014-05-19 LAB — CREATININE, SERUM
Creatinine, Ser: 1.29 mg/dL — ABNORMAL HIGH (ref 0.50–1.10)
GFR, EST AFRICAN AMERICAN: 40 mL/min — AB (ref >90)
GFR, EST NON AFRICAN AMERICAN: 34 mL/min — AB (ref >90)

## 2014-05-19 LAB — TSH: TSH: 2.536 u[IU]/mL (ref 0.350–4.500)

## 2014-05-19 MED ORDER — DEXTROSE 50 % IV SOLN
INTRAVENOUS | Status: AC
  Administered 2014-05-19: 50 mL
  Filled 2014-05-19: qty 50

## 2014-05-19 MED ORDER — IPRATROPIUM-ALBUTEROL 0.5-2.5 (3) MG/3ML IN SOLN
3.0000 mL | Freq: Four times a day (QID) | RESPIRATORY_TRACT | Status: DC
  Administered 2014-05-19 – 2014-05-20 (×7): 3 mL via RESPIRATORY_TRACT
  Filled 2014-05-19 (×7): qty 3

## 2014-05-19 MED ORDER — GLUCAGON HCL RDNA (DIAGNOSTIC) 1 MG IJ SOLR
INTRAMUSCULAR | Status: AC
  Filled 2014-05-19: qty 1

## 2014-05-19 NOTE — Progress Notes (Signed)
MBSS complete. Full report located under chart review in imaging section.  Riel Hirschman Paiewonsky, M.A. CCC-SLP [phone]  

## 2014-05-19 NOTE — Clinical Social Work Psychosocial (Signed)
Clinical Social Work Department BRIEF PSYCHOSOCIAL ASSESSMENT 05/19/2014  Patient:  [name],[name]     Account Number:  [id number]     Admit date:  05/18/2014  Clinical Social Worker:  Delmer IslamRAWFORD,Peretz Thieme, LCSW  Date/Time:  05/19/2014 04:46 AM  Referred by:  Physician  Date Referred:  05/18/2014 Referred for  SNF Placement   Other Referral:   Interview type:  Family Other interview type:    PSYCHOSOCIAL DATA Living Status:  FAMILY Admitted from facility:   Level of care:   Primary support name:  [name] DavenportSandra Rangel Primary support relationship to patient:  CHILD, ADULT Degree of support available:   Strong support from daughter. Her phone number is (c) [phone] and (h) [phone].    CURRENT CONCERNS Current Concerns  Post-Acute Placement   Other Concerns:    SOCIAL WORK ASSESSMENT / PLAN CSW talked with daughter by phone regarding discharge planning and recommendation of ST rehab. Call made to daughter as patient is only oriented to Rangel and place. Ms. [name] was receptive to talking with CSW and is in agreement with short-term rehab. [facility]   Assessment/plan status:  Psychosocial Support/Ongoing Assessment of Needs Other assessment/ plan:   Information/referral to community resources:   None needed or requested at this time.    PATIENT'S/FAMILY'S RESPONSE TO PLAN OF CARE: Ms. [name] in agreement with rehab for patient and provided a facility preference.       [name], MSW, LCSW Licensed Clinical Social Worker Clinical Social Work Department [facility] [phone]

## 2014-05-19 NOTE — Evaluation (Signed)
Clinical/Bedside Swallow Evaluation Patient Details  Name: Kamariah Fruchter MRN: [id number] Date of Birth: 1917/08/27  Today's Date: 05/19/2014 Time: SLP Start Time (ACUTE ONLY): 0855 SLP Stop Time (ACUTE ONLY): 0908 SLP Time Calculation (min) (ACUTE ONLY): 13 min  Past Medical History:  Past Medical History  Diagnosis Date  . UTI (urinary tract infection)   . Hypertension   . Diabetes mellitus   . Gout   . High cholesterol   . Complication of anesthesia   . Diabetic neuropathy   . PVD (peripheral vascular disease)   . Carotid artery occlusion   . Arthritis   . Chronic kidney disease   . GERD (gastroesophageal reflux disease)   . Vertigo   . Venous insufficiency   . Mood disorder   . Dysphagia   . Hemiparesis and alteration of sensations as late effects of stroke 12/09/2013   Past Surgical History:  Past Surgical History  Procedure Laterality Date  . Hemorrhoid banding    . Abdominal hysterectomy    . Cardiac catheterization    . Lower extremity angiogram N/A 05/26/2013    Procedure: LOWER EXTREMITY ANGIOGRAM;  Surgeon: Nada Libman, MD;  Location: [facility] CATH LAB;  Service: Cardiovascular;  Laterality: N/A;   HPI:  Stashia Rueckert is a 81 y.o. female presents with a decline in status. The patients daughter states that she has not been feeling very well for about five days. Patient has had cough and some mucus production. Patient has had some drooling also. The daughter states that she had a stroke and since then she has had slurred speech but otherwise she is relatively functional. On her CXR there is some increased infiltrate suggestive of pneumonia. Pt seen by SLP in 2015, recommended to consume Dys 2/thin due to pocketing on left and for energy conservation.   Assessment / Plan / Recommendation Clinical Impression  Pt demonstrates immediate and delayed coughing following liquids concerning for aspiration with possible esophageal component. Given current dx of pna and  possibility to reduce risk with strategies or diet, will f/u with objective test. Pt may continue current diet until MBS complete later today.     Aspiration Risk  Moderate    Diet Recommendation   may continue regular thin       Other  Recommendations Recommended Consults: MBS   Follow Up Recommendations    TBD   Frequency and Duration     TBD   Pertinent Vitals/Pain NA    SLP Swallow Goals  TBD following MBS   Swallow Study Prior Functional Status       General HPI: Kiondra Arcand is a 81 y.o. female presents with a decline in status. The patients daughter states that she has not been feeling very well for about five days. Patient has had cough and some mucus production. Patient has had some drooling also. The daughter states that she had a stroke and since then she has had slurred speech but otherwise she is relatively functional. On her CXR there is some increased infiltrate suggestive of pneumonia. Pt seen by SLP in 2015, recommended to consume Dys 2/thin due to pocketing on left and for energy conservation. Type of Study: Bedside swallow evaluation Previous Swallow Assessment: BSE 2015 see statement Diet Prior to this Study: Regular;Thin liquids Temperature Spikes Noted: No Respiratory Status: Nasal cannula History of Recent Intubation: No Behavior/Cognition: Alert;Cooperative Oral Cavity - Dentition: Edentulous Self-Feeding Abilities: Able to feed self;Needs assist Patient Positioning: Upright in bed Baseline Vocal Quality: Hoarse Volitional Cough: Congested  Volitional Swallow: Able to elicit    Oral/Motor/Sensory Function Overall Oral Motor/Sensory Function: Appears within functional limits for tasks assessed   Ice Chips     Thin Liquid Thin Liquid: Impaired Presentation: Cup;Straw;Self Fed Pharyngeal  Phase Impairments: Suspected delayed Swallow;Throat Clearing - Immediate;Cough - Immediate;Cough - Delayed;Throat Clearing - Delayed    Nectar Thick Nectar  Thick Liquid: Not tested   Honey Thick Honey Thick Liquid: Not tested   Puree Puree: Impaired Presentation: Spoon Oral Phase Impairments: Impaired anterior to posterior transit Pharyngeal Phase Impairments: Suspected delayed Swallow;Cough - Delayed;Throat Clearing - Delayed   Solid   GO    Solid: Not tested      Harlon DittyBonnie Dakiyah Heinke, MA CCC-SLP [phone]  Claudine MoutonDeBlois, Dannia Snook Caroline 05/19/2014,9:12 AM

## 2014-05-19 NOTE — Progress Notes (Signed)
[facility] PROGRESS NOTE  [name] ZOX:[id number] DOB: Oct 18, 1917 DOA: 05/18/2014 PCP: Dorrene GermanAVBUERE,EDWIN A, MD  Assessment/Plan:  Principal Problem:   CAP (community acquired pneumonia) no fever, cough leukocytosis currently. Continue abx for now. Likely change to po tomorrow. Await pt ot Active Problems:   CKD (chronic kidney disease), stage III   DM type 2, uncontrolled, with neuropathy:  controlled   Hypertensive renal disease   Acute respiratory failure with hypoxemia: wean oxygen  HPI/Subjective: No complaints.   Objective: Filed Vitals:   05/19/14 1017  BP: 137/98  Pulse: 69  Temp: 99.6 F (37.6 C)  Resp: 17    Intake/Output Summary (Last 24 hours) at 05/19/14 1326 Last data filed at 05/19/14 0500  Gross per 24 hour  Intake 498.33 ml  Output      0 ml  Net 498.33 ml   Filed Weights   05/18/14 2316  Weight: 69.9 kg (154 lb 1.6 oz)    Exam:   General:  Alert. Confused. Eating lunch asking for sugar and salk  Cardiovascular: RRR without MGR  Respiratory: CTA without WRR  Abdomen: S, NT ND  Ext: no CCE  Basic Metabolic Panel:  Recent Labs Lab 05/12/14 2019 05/12/14 2026 05/18/14 2056 05/19/14 0051 05/19/14 0645  NA 143 144 145  --  145  K 4.0 4.0 3.9  --  3.8  CL 104 101 104  --  106  CO2 35*  --  37*  --  34*  GLUCOSE 199* 199* 161*  --  96  BUN 18 21 24*  --  24*  CREATININE 1.16* 1.10 1.36* 1.29* 1.29*  CALCIUM 9.1  --  8.6  --  8.2*   Liver Function Tests:  Recent Labs Lab 05/12/14 2019 05/19/14 0645  AST 20 20  ALT 15 15  ALKPHOS 80 62  BILITOT 0.4 0.4  PROT 6.9 5.4*  ALBUMIN 3.2* 2.6*   No results for input(s): LIPASE, AMYLASE in the last 168 hours. No results for input(s): AMMONIA in the last 168 hours. CBC:  Recent Labs Lab 05/12/14 2019 05/12/14 2026 05/18/14 2056 05/19/14 0051 05/19/14 0645  WBC 6.5  --  11.4* 9.6 8.3  NEUTROABS 3.8  --   --   --   --   HGB 12.5 15.0 12.1 10.6* 10.4*  HCT 40.5  44.0 39.4 34.7* 34.7*  MCV 91.4  --  93.4 93.5 93.8  PLT 209  --  211 201 188   Cardiac Enzymes: No results for input(s): CKTOTAL, CKMB, CKMBINDEX, TROPONINI in the last 168 hours. BNP (last 3 results)  Recent Labs  05/18/14 2056  BNP 83.8    ProBNP (last 3 results) No results for input(s): PROBNP in the last 8760 hours.  CBG:  Recent Labs Lab 05/12/14 2126 05/18/14 2313 05/19/14 0754 05/19/14 1156  GLUCAP 142* 128* 89 99    Recent Results (from the past 240 hour(s))  Urine culture     Status: None   Collection Time: 05/12/14  9:37 PM  Result Value Ref Range Status   Specimen Description URINE, CLEAN CATCH  Final   Special Requests NONE  Final   Colony Count   Final    45,000 COLONIES/ML Performed at [facility]    Culture   Final    Multiple bacterial morphotypes present, none predominant. Suggest appropriate recollection if clinically indicated. Performed at [facility]    Report Status 05/14/2014 FINAL  Final     [facility] [id number]  05/18/2014   CLINICAL DATA:  Presents with 5 days of functional decline-unable to stand, bilateral leg swellng, pink tinged sputum and cough, slurred speech in the evenings, lack of appetite and fatigue.  EXAM: PORTABLE CHEST - 1 VIEW  COMPARISON:  05/12/2014 and 01/27/2014  FINDINGS: Lungs are adequately inflated demonstrate mild bibasilar opacification left worse than right as cannot exclude infection. Stable cardiomegaly. Stable prominence of the aortic arch with tortuosity of the thoracic aorta. Remainder of the exam is unchanged.  IMPRESSION: Bibasilar opacification left worse than right which may represent a pneumonia.  Stable cardiomegaly and stable prominence of the aortic arch.   Electronically Signed   By: Elberta Fortis M.D.   On: 05/18/2014 20:56    Scheduled Meds: . allopurinol  100 mg Oral BID  . amLODipine  10 mg Oral Daily  . aspirin EC  81 mg Oral Daily  . atorvastatin  20 mg Oral Daily   . azithromycin  500 mg Intravenous Q24H  . cefTRIAXone (ROCEPHIN)  IV  1 g Intravenous Q24H  . donepezil  10 mg Oral QHS  . folic acid  1 mg Oral Daily  . gabapentin  300 mg Oral TID  . glipiZIDE  5 mg Oral BID WC  . heparin  5,000 Units Subcutaneous 3 times per day  . Influenza vac split quadrivalent PF  0.5 mL Intramuscular Tomorrow-1000  . insulin aspart  0-15 Units Subcutaneous TID WC  . insulin aspart protamine- aspart  20 Units Subcutaneous Q supper  . insulin aspart protamine- aspart  33 Units Subcutaneous Q breakfast  . ipratropium-albuterol  3 mL Nebulization Q6H  . loratadine  10 mg Oral Daily  . multivitamin with minerals  1 tablet Oral Daily  . risperiDONE  0.5 mg Oral BID  . sertraline  50 mg Oral Daily  . sodium chloride  3 mL Intravenous Q12H  . thiamine  100 mg Oral Daily   Continuous Infusions: . sodium chloride 50 mL/hr at 05/19/14 0002    Time spent: 35 minutes  Twala Collings L  [facility]  [email or web address], password [facility] 05/19/2014, 1:26 PM  LOS: 1 day

## 2014-05-20 LAB — HEMOGLOBIN A1C
Hgb A1c MFr Bld: 7.3 % — ABNORMAL HIGH (ref 4.8–5.6)
MEAN PLASMA GLUCOSE: 163 mg/dL

## 2014-05-20 LAB — GLUCOSE, CAPILLARY
GLUCOSE-CAPILLARY: 51 mg/dL — AB (ref 70–99)
GLUCOSE-CAPILLARY: 136 mg/dL — AB (ref 70–99)
GLUCOSE-CAPILLARY: 133 mg/dL — AB (ref 70–99)
Glucose-Capillary: 63 mg/dL — ABNORMAL LOW (ref 70–99)
Glucose-Capillary: 148 mg/dL — ABNORMAL HIGH (ref 70–99)
Glucose-Capillary: 234 mg/dL — ABNORMAL HIGH (ref 70–99)
Glucose-Capillary: 222 mg/dL — ABNORMAL HIGH (ref 70–99)

## 2014-05-20 LAB — HIV ANTIBODY (ROUTINE TESTING W REFLEX): HIV Screen 4th Generation wRfx: NONREACTIVE

## 2014-05-20 MED ORDER — DEXTROSE 50 % IV SOLN
INTRAVENOUS | Status: AC
  Administered 2014-05-20: 50 mL
  Filled 2014-05-20: qty 50

## 2014-05-20 MED ORDER — RESOURCE THICKENUP CLEAR PO POWD
ORAL | Status: DC | PRN
  Filled 2014-05-20: qty 125

## 2014-05-20 MED ORDER — CEFUROXIME AXETIL 500 MG PO TABS
500.0000 mg | ORAL_TABLET | Freq: Two times a day (BID) | ORAL | Status: DC
  Administered 2014-05-20 – 2014-05-21 (×3): 500 mg via ORAL
  Filled 2014-05-20 (×4): qty 1

## 2014-05-20 MED ORDER — SODIUM CHLORIDE 0.9 % IV SOLN: 250.0000 mL | INTRAVENOUS | Status: DC | PRN

## 2014-05-20 MED ORDER — SODIUM CHLORIDE 0.9 % IJ SOLN: 3.0000 mL | INTRAMUSCULAR | Status: DC | PRN

## 2014-05-20 MED ORDER — SODIUM CHLORIDE 0.9 % IJ SOLN
3.0000 mL | Freq: Two times a day (BID) | INTRAMUSCULAR | Status: DC
  Administered 2014-05-20 – 2014-05-21 (×2): 3 mL via INTRAVENOUS

## 2014-05-20 MED ORDER — AZITHROMYCIN 500 MG PO TABS
500.0000 mg | ORAL_TABLET | Freq: Every day | ORAL | Status: DC
  Administered 2014-05-20 – 2014-05-21 (×2): 500 mg via ORAL
  Filled 2014-05-20 (×2): qty 1

## 2014-05-20 NOTE — Progress Notes (Signed)
Inpatient Diabetes Program Recommendations  AACE/ADA: New Consensus Statement on Inpatient Glycemic Control (2013)  Target Ranges:  Prepandial:   less than 140 mg/dL      Peak postprandial:   less than 180 mg/dL (1-2 hours)      Critically ill patients:  140 - 180 mg/dL   Reason for assessment: low blood sugar  Diabetes history: Type 2  Outpatient Diabetes medications: Humalog 75/25 33 units qam, 20 units qpm, Glipizide 5mg  bid, Novolog 0-9 units prn for high blood sugars Current orders for Inpatient glycemic control: Novolog moderate correction tid with meals  Consider starting the patient on 20 units Lantus insulin qday (this is 50 % of the basal insulin she had been on at home).  [name] RacerJulie Liani Caris, RN, BA, MHA, CDE Diabetes Coordinator Inpatient Diabetes Program  [phone] (Team Pager) [phone] Patrcia Dolly([place] Office) 05/20/2014 10:46 AM

## 2014-05-20 NOTE — Evaluation (Signed)
Occupational Therapy Evaluation and Discharge Patient Details Name: [name] MRN: [id number] DOB: 08-04-1917 Today's Date: 05/20/2014    History of Present Illness Pt is a 81 y.o. female presents with a decline in status. The patients daughter states that she has not been feeling very well for about five days. The daughter states that she had a stroke and since then she has had slurred speech but otherwise she is relatively functional. She has noted that she has had more shortness of breath. Patient has had a recent UTI and was on treatment for this. Patients daughter states that she has a major concern for her as there are periods during the day when she is alone in the house. On presentation to the ED she is somewhat somnolent though will converse on occasion and she was noted to be hypoxic. On her CXR there is some increased infiltrate suggestive of pneumonia.   Clinical Impression   This 81 yo female admitted with above presents to acute OT with decreased use of left side from CVA (pre-morbid), decreased balance and decreased mobility all affecting her ability to care for herself. She will continue to benefit from OT at [facility]. Acute OT will sign off.    Follow Up Recommendations  SNF    Equipment Recommendations   (TBD at next venue)       Precautions / Restrictions Precautions Precautions: Fall Restrictions Weight Bearing Restrictions: No      Mobility Bed Mobility Overal bed mobility: Needs Assistance Bed Mobility: Sit to Supine;Rolling Rolling: Min assist   Supine to sit: Mod assist Sit to supine: Max assist   General bed mobility comments: Pt was able to initiate transfer to EOB. Assist to elevate trunk to full sitting position. Bed pad used to facilitarte scooting to EOB.   Transfers Overall transfer level: Needs assistance Equipment used: 1 person hand held assist Transfers: Sit to/from [employer] Sit to Stand: Mod assist Stand pivot  transfers: Mod assist       General transfer comment: Pt was able to power-up to full stand with L knee blocked. Pt unable to let go of the bed rail to initiate transfer to chair, and although pt insisted that she could get to the chair, was not able to. Max assist was provided for SPT to recliner and pt holding onto therapist's arms instead of the bed rails.     Balance Overall balance assessment: Needs assistance Sitting-balance support: Feet supported;No upper extremity supported Sitting balance-Leahy Scale: Poor     Standing balance support: Single extremity supported Standing balance-Leahy Scale: Poor                              ADL Overall ADL's : Needs assistance/impaired Eating/Feeding: Set up;Supervision/ safety;Sitting   Grooming: Moderate assistance;Sitting   Upper Body Bathing: Moderate assistance;Sitting   Lower Body Bathing: Total assistance (with one 2 stand and one to wash)   Upper Body Dressing : Maximal assistance;Sitting   Lower Body Dressing: Total assistance (with one to stand and one for clothing)   Toilet Transfer: Moderate assistance;Stand-pivot;BSC   Toileting- Clothing Manipulation and Hygiene: Total assistance (with one to stand and one to clean)               Vision Additional Comments: No change from baseline          Pertinent Vitals/Pain Pain Assessment: No/denies pain     Hand Dominance  Left  Extremity/Trunk Assessment Upper Extremity Assessment Upper Extremity Assessment: LUE deficits/detail LUE Deficits / Details: Decreased strength and AROM/PROM consistent with hemiplegia from CVA. Noted general edema in LUE as well.  LUE Coordination: decreased fine motor;decreased gross motor     Communication Communication Communication: Other (comment) (Pt difficult to understand )   Cognition Arousal/Alertness: Awake/alert Behavior During Therapy: WFL for tasks assessed/performed Overall Cognitive Status: No  family/caregiver present to determine baseline cognitive functioning                                Home Living Family/patient expects to be discharged to:: Skilled nursing facility Living Arrangements: Children                               Additional Comments: Per chart review pt is alone in the house at times during the day.       Prior Functioning/Environment Level of Independence: Needs assistance  Gait / Transfers Assistance Needed: Pt states she has not ambulated since her stroke. Unsure how reliable this information is.  ADL's / Homemaking Assistance Needed: Pt states an aide comes in to assist her with bathing and dressing.         OT Diagnosis: Generalized weakness;Hemiplegia dominant side   OT Problem List: Decreased strength;Decreased range of motion;Impaired balance (sitting and/or standing);Impaired UE functional use;Decreased coordination;Impaired tone;Obesity      OT Goals(Current goals can be found in the care plan section) Acute Rehab OT Goals Patient Stated Goal: to go to rehab where I can learn to walk again  OT Frequency:                End of Session Equipment Utilized During Treatment: Gait belt Nurse Communication:  (Pt with bowel movemen to not normal color (orange/yellow) and texture (pasty))  Activity Tolerance: Patient tolerated treatment well Patient left: in bed;with call bell/phone within reach;with bed alarm set   Time: 1356-1426 OT Time Calculation (min): 30 min Charges:  OT General Charges $OT Visit: 1 Procedure OT Evaluation $Initial OT Evaluation Tier I: 1 Procedure OT Treatments $Self Care/Home Management : 8-22 mins  Evette GeorgesLeonard, Jethro Radke Eva [phone] 05/20/2014, 2:58 PM

## 2014-05-20 NOTE — Progress Notes (Signed)
[facility] PROGRESS NOTE  [name] WUJ:[id number] DOB: 09/29/1917 DOA: 05/18/2014 PCP: [name], MD  Assessment/Plan:  Principal Problem:   CAP (community acquired pneumonia) no fever, cough leukocytosis currently. Change to PO abx. Now that pt more responsive, does report chest congestion, wheeze, cough PTA. Active Problems:   CKD (chronic kidney disease), stage III   DM type 2, uncontrolled, with neuropathy:  controlled   Hypertensive renal disease   Acute respiratory failure with hypoxemia: wean oxygen as able Deconditioning: to SNF  HPI/Subjective: Feels weak. Cough, congestion, wheeze PTA. Improving.  Objective: Filed Vitals:   05/20/14 2012  BP:   Pulse: 63  Temp:   Resp: 18    Intake/Output Summary (Last 24 hours) at 05/20/14 2128 Last data filed at 05/20/14 1700  Gross per 24 hour  Intake    480 ml  Output    301 ml  Net    179 ml   Filed Weights   05/18/14 2316 05/19/14 2049  Weight: 69.9 kg (154 lb 1.6 oz) 70.1 kg (154 lb 8.7 oz)    Exam:   General:  Alert. Less confused. Brighter, more talkative and more appropriate  Cardiovascular: RRR without MGR  Respiratory: CTA without WRR  Abdomen: S, NT ND  Ext: no CCE  Basic Metabolic Panel:  Recent Labs Lab 05/18/14 2056 05/19/14 0051 05/19/14 0645  NA 145  --  145  K 3.9  --  3.8  CL 104  --  106  CO2 37*  --  34*  GLUCOSE 161*  --  96  BUN 24*  --  24*  CREATININE 1.36* 1.29* 1.29*  CALCIUM 8.6  --  8.2*   Liver Function Tests:  Recent Labs Lab 05/19/14 0645  AST 20  ALT 15  ALKPHOS 62  BILITOT 0.4  PROT 5.4*  ALBUMIN 2.6*   No results for input(s): LIPASE, AMYLASE in the last 168 hours. No results for input(s): AMMONIA in the last 168 hours. CBC:  Recent Labs Lab 05/18/14 2056 05/19/14 0051 05/19/14 0645  WBC 11.4* 9.6 8.3  HGB 12.1 10.6* 10.4*  HCT 39.4 34.7* 34.7*  MCV 93.4 93.5 93.8  PLT 211 201 188   Cardiac Enzymes: No results for  input(s): CKTOTAL, CKMB, CKMBINDEX, TROPONINI in the last 168 hours. BNP (last 3 results)  Recent Labs  05/18/14 2056  BNP 83.8    ProBNP (last 3 results) No results for input(s): PROBNP in the last 8760 hours.  CBG:  Recent Labs Lab 05/20/14 0331 05/20/14 0401 05/20/14 0849 05/20/14 1214 05/20/14 1656  GLUCAP 63* 136* 133* 148* 234*    Recent Results (from the past 240 hour(s))  Urine culture     Status: None   Collection Time: 05/12/14  9:37 PM  Result Value Ref Range Status   Specimen Description URINE, CLEAN CATCH  Final   Special Requests NONE  Final   Colony Count   Final    45,000 COLONIES/ML Performed at [facility]    Culture   Final    Multiple bacterial morphotypes present, none predominant. Suggest appropriate recollection if clinically indicated. Performed at [facility]    Report Status 05/14/2014 FINAL  Final  Blood culture (routine x 2)     Status: None (Preliminary result)   Collection Time: 05/18/14 10:03 PM  Result Value Ref Range Status   Specimen Description BLOOD RIGHT ANTECUBITAL  Final   Special Requests BOTTLES DRAWN AEROBIC AND ANAEROBIC 10CC EA  Final   Culture  Final           BLOOD CULTURE RECEIVED NO GROWTH TO DATE CULTURE WILL BE HELD FOR 5 DAYS BEFORE ISSUING A FINAL NEGATIVE REPORT Note: Culture results may be compromised due to an excessive volume of blood received in culture bottles. Performed at [facility]    Report Status PENDING  Incomplete  Blood culture (routine x 2)     Status: None (Preliminary result)   Collection Time: 05/18/14 10:35 PM  Result Value Ref Range Status   Specimen Description BLOOD RIGHT UPPER ARM  Final   Special Requests BOTTLES DRAWN AEROBIC AND ANAEROBIC 10CC EA  Final   Culture   Final           BLOOD CULTURE RECEIVED NO GROWTH TO DATE CULTURE WILL BE HELD FOR 5 DAYS BEFORE ISSUING A FINAL NEGATIVE REPORT Note: Culture results may be compromised due to an excessive  volume of blood received in culture bottles. Performed at [facility]    Report Status PENDING  Incomplete     Studies: Dg Swallowing Func-speech Pathology  05/19/2014    Objective Swallowing Evaluation:    Patient Details  Name: [name] MRN: [id number] Date of Birth: [birth year]  Today's Date: 05/19/2014 Time: SLP Start Time (ACUTE ONLY): 1230-SLP Stop Time (ACUTE ONLY): 1242 SLP Time Calculation (min) (ACUTE ONLY): 12 min  Past Medical History:  Past Medical History  Diagnosis Date  . UTI (urinary tract infection)   . Hypertension   . Diabetes mellitus   . Gout   . High cholesterol   . Complication of anesthesia   . Diabetic neuropathy   . PVD (peripheral vascular disease)   . Carotid artery occlusion   . Arthritis   . Chronic kidney disease   . GERD (gastroesophageal reflux disease)   . Vertigo   . Venous insufficiency   . Mood disorder   . Dysphagia   . Hemiparesis and alteration of sensations as late effects of stroke  12/09/2013   Past Surgical History:  Past Surgical History  Procedure Laterality Date  . Hemorrhoid banding    . Abdominal hysterectomy    . Cardiac catheterization    . Lower extremity angiogram N/A 05/26/2013    Procedure: LOWER EXTREMITY ANGIOGRAM;  Surgeon: Nada Libman, MD;   Location: [facility] CATH LAB;  Service: Cardiovascular;  Laterality: N/A;   HPI:  HPI: [name] is a 81 y.o. female presents with a decline in  status. The patients daughter states that she has not been feeling very  well for about five days. Patient has had cough and some mucus production.  Patient has had some drooling also. The daughter states that she had a  stroke and since then she has had slurred speech but otherwise she is  relatively functional. On her CXR there is some increased infiltrate  suggestive of pneumonia. Pt seen by SLP in 2015, recommended to consume  Dys 2/thin due to pocketing on left and for energy conservation.  No Data Recorded  Assessment / Plan / Recommendation CHL IP  CLINICAL IMPRESSIONS 05/19/2014  Dysphagia Diagnosis Mild oral phase dysphagia;Mild pharyngeal phase  dysphagia   Clinical impression Pt has a mild oropharyngeal dysphagia, with oral phase  overall functional except for inability to transit barium tablet to reach  the pharynx, requiring oral expectoration. She has premature spillage with  all consistencies, with intermittent, frank penetration of thin liquids  that clears with a cued cough. With Min cues for small, single  sips, no  further penetration is observed. Of note, pt does not have any material  enter her airway with large, consecutive cup sips of nectar, making this  consistency a viable option should use of compensatory strategies be  difficult. Would recommend to continue with regular diet and thin liquids  with no straws and at least initial full supervision to ensure utilization  of small sips. SLP to follow for diet tolerance.      CHL IP TREATMENT RECOMMENDATION 05/19/2014  Treatment Plan Recommendations Therapy as outlined in treatment plan below      CHL IP DIET RECOMMENDATION 05/19/2014  Diet Recommendations Regular;Thin liquid  Liquid Administration via Cup;No straw  Medication Administration Crushed with puree  Compensations Slow rate;Small sips/bites;Clear throat intermittently  Postural Changes and/or Swallow Maneuvers Seated upright 90  degrees;Upright 30-60 min after meal     CHL IP OTHER RECOMMENDATIONS 05/19/2014  Recommended Consults MBS  Oral Care Recommendations Oral care BID  Other Recommendations (None)     CHL IP FOLLOW UP RECOMMENDATIONS 05/19/2014  Follow up Recommendations None     CHL IP FREQUENCY AND DURATION 05/19/2014  Speech Therapy Frequency (ACUTE ONLY) min 2x/week  Treatment Duration 1 week     Pertinent Vitals/Pain: N/A     SLP Swallow Goals CHL IP SWALLOW STUDY GOALS 11/23/2011  Patient will consume recommended diet without observed clinical signs of  aspiration with Supervision/safety;Set-up  Swallow Study Goal #1 - Progress  (None)  Patient will utilize recommended strategies during swallow to increase  swallowing safety with Minimal assistance      CHL IP REASON FOR REFERRAL 05/19/2014  Reason for Referral Objectively evaluate swallowing function     CHL IP ORAL PHASE 05/19/2014  Oral Phase Impaired  Oral - Nectar Cup Weak lingual manipulation;Incomplete tongue to palate  contact  Oral - Thin Cup Weak lingual manipulation;Incomplete tongue to palate  contact  Oral - Puree Weak lingual manipulation;Incomplete tongue to palate contact   Oral - Mechanical Soft Weak lingual manipulation;Incomplete tongue to  palate contact  Oral - Pill Weak lingual manipulation;Incomplete tongue to palate  contact;Other (Comment)  Oral Phase - Comment (None)                             CHL IP PHARYNGEAL PHASE 05/19/2014  Pharyngeal Phase Impaired  Pharyngeal - Nectar Cup Reduced anterior laryngeal mobility;Reduced  laryngeal elevation;Premature spillage to pyriform sinuses  Pharyngeal - Thin Cup Reduced anterior laryngeal mobility;Reduced  laryngeal elevation;Premature spillage to pyriform  sinuses;Penetration/Aspiration before swallow  Penetration/Aspiration details (thin cup) Material enters airway, remains  ABOVE vocal cords and not ejected out  Pharyngeal - Puree Premature spillage to valleculae;Reduced anterior  laryngeal mobility;Reduced laryngeal elevation  Penetration/Aspiration details (puree) (None)  Pharyngeal - Mechanical Soft Reduced anterior laryngeal mobility;Reduced  laryngeal elevation  Pharyngeal - Pill Other (Comment)                                       CHL IP CERVICAL ESOPHAGEAL PHASE 05/19/2014  Cervical Esophageal Phase [facility]         Maxcine Ham, M.A. CCC-SLP [phone]  Maxcine Ham 05/19/2014, 2:15 PM     Scheduled Meds: . allopurinol  100 mg Oral BID  . amLODipine  10 mg Oral Daily  . aspirin EC  81 mg Oral Daily  . atorvastatin  20 mg Oral Daily  .  azithromycin  500 mg Oral Daily  . cefUROXime  500 mg Oral BID WC   . donepezil  10 mg Oral QHS  . folic acid  1 mg Oral Daily  . gabapentin  300 mg Oral TID  . heparin  5,000 Units Subcutaneous 3 times per day  . Influenza vac split quadrivalent PF  0.5 mL Intramuscular Tomorrow-1000  . insulin aspart  0-15 Units Subcutaneous TID WC  . ipratropium-albuterol  3 mL Nebulization Q6H  . loratadine  10 mg Oral Daily  . multivitamin with minerals  1 tablet Oral Daily  . risperiDONE  0.5 mg Oral BID  . sertraline  50 mg Oral Daily  . sodium chloride  3 mL Intravenous Q12H  . sodium chloride  3 mL Intravenous Q12H  . thiamine  100 mg Oral Daily   Continuous Infusions:    Time spent: 35 minutes  [name] L  [facility]  [email or web address], password [facility] 05/20/2014, 9:28 PM  LOS: 2 days

## 2014-05-20 NOTE — Evaluation (Signed)
Physical Therapy Evaluation Patient Details Name: [name] MRN: [id number] DOB: [birth year] Today's Date: 05/20/2014   History of Present Illness  Pt is a 81 y.o. female presents with a decline in status. The patients daughter states that she has not been feeling very well for about five days. The daughter states that she had a stroke and since then she has had slurred speech but otherwise she is relatively functional. She has noted that she has had more shortness of breath. Patient has had a recent UTI and was on treatment for this. Patients daughter states that she has a major concern for her as there are periods during the day when she is alone in the house. On presentation to the ED she is somewhat somnolent though will converse on occasion and she was noted to be hypoxic. On her CXR there is some increased infiltrate suggestive of pneumonia.  Clinical Impression  Pt admitted with above diagnosis. Pt currently with functional limitations due to the deficits listed below (see PT Problem List). At the time of PT eval pt was able to perform transfers with mod-max assist. Pt reports she has not been ambulatory since her CVA in ?October 2015. Date of stroke is unclear. Pt will benefit from skilled PT to increase their independence and safety with mobility to allow discharge to the venue listed below. As pt is alone at times during the day she will require STR at the SNF level prior to return home.       Follow Up Recommendations SNF;Supervision/Assistance - 24 hour    Equipment Recommendations  Other (comment) (TBD by next venue of care)    Recommendations for Other Services       Precautions / Restrictions Precautions Precautions: Fall Restrictions Weight Bearing Restrictions: No      Mobility  Bed Mobility Overal bed mobility: Needs Assistance Bed Mobility: Supine to Sit     Supine to sit: Mod assist     General bed mobility comments: Pt was able to initiate transfer to  EOB. Assist to elevate trunk to full sitting position. Bed pad used to facilitarte scooting to EOB.   Transfers Overall transfer level: Needs assistance Equipment used: 1 person hand held assist Transfers: Sit to/from [employer] Sit to Stand: Mod assist Stand pivot transfers: Max assist       General transfer comment: Pt was able to power-up to full stand with L knee blocked. Pt unable to let go of the bed rail to initiate transfer to chair, and although pt insisted that she could get to the chair, was not able to. Max assist was provided for SPT to recliner and pt holding onto therapist's arms instead of the bed rails.   Ambulation/Gait             General Gait Details: Unable at this time. Pt states she has not ambulated since her stroke.   Stairs            Wheelchair Mobility    Modified Rankin (Stroke Patients Only)       Balance Overall balance assessment: Needs assistance Sitting-balance support: Feet supported;No upper extremity supported Sitting balance-Leahy Scale: Poor     Standing balance support: Bilateral upper extremity supported Standing balance-Leahy Scale: Poor                               Pertinent Vitals/Pain Pain Assessment: No/denies pain    Home Living Family/patient expects  to be discharged to:: Skilled nursing facility Living Arrangements: Children               Additional Comments: Per chart review pt is alone in the house at times during the day.     Prior Function Level of Independence: Needs assistance   Gait / Transfers Assistance Needed: Pt states she has not ambulated since her stroke. Unsure how reliable this information is.   ADL's / Homemaking Assistance Needed: Pt states an aide comes in to assist her with bathing and dressing.         Hand Dominance        Extremity/Trunk Assessment   Upper Extremity Assessment: LUE deficits/detail       LUE Deficits / Details: Decreased  strength and AROM consistent with hemiplegia from CVA. Noted general edema in LUE as well.    Lower Extremity Assessment: LLE deficits/detail   LLE Deficits / Details: Decreased strength and AROM consistent with hemiplegia from CVA.   Cervical / Trunk Assessment: Kyphotic  Communication   Communication: Other (comment) (Pt difficult to understand )  Cognition Arousal/Alertness: Awake/alert Behavior During Therapy: WFL for tasks assessed/performed Overall Cognitive Status: No family/caregiver present to determine baseline cognitive functioning                      General Comments      Exercises        Assessment/Plan    PT Assessment Patient needs continued PT services  PT Diagnosis Difficulty walking;Generalized weakness   PT Problem List Decreased strength;Decreased range of motion;Decreased activity tolerance;Decreased balance;Decreased mobility;Decreased knowledge of use of DME;Decreased safety awareness;Decreased knowledge of precautions;Cardiopulmonary status limiting activity  PT Treatment Interventions DME instruction;Gait training;Stair training;Functional mobility training;Therapeutic activities;Therapeutic exercise;Neuromuscular re-education;Patient/family education   PT Goals (Current goals can be found in the Care Plan section) Acute Rehab PT Goals Patient Stated Goal: Pt did not state goals at this time.  PT Goal Formulation: With patient Time For Goal Achievement: 06/03/14 Potential to Achieve Goals: Fair    Frequency Min 2X/week   Barriers to discharge Decreased caregiver support Pt is alone at times during the day.     Co-evaluation               End of Session Equipment Utilized During Treatment: Gait belt;Oxygen Activity Tolerance: Patient limited by fatigue (weakness) Patient left: in chair;with call bell/phone within reach Nurse Communication: Mobility status         Time: [id number] PT Time Calculation (min) (ACUTE ONLY): 21  min   Charges:   PT Evaluation $Initial PT Evaluation Tier I: 1 Procedure     PT G Codes:        Conni SlipperKirkman, Faelyn Sigler 05/20/2014, 12:19 PM   Conni SlipperLaura Virginia Francisco, PT, DPT Acute Rehabilitation Services Pager: [phone]

## 2014-05-20 NOTE — Progress Notes (Signed)
Speech Language Pathology Treatment:    Patient Details Name: [name] MRN: [id number] DOB: 1918-02-21 Today's Date: 05/20/2014 Time: [id number] SLP Time Calculation (min) (ACUTE ONLY): 30 min  Assessment / Plan / Recommendation Clinical Impression  Follow-up for Dysphagia treatment following MBS. PO trials with solid consistencies did not elicit s/s of aspiration. Pt demonstrated delayed cough x3 following sips of thin liquids and wet vocal quality. Downgraded pt to small sips of nectar thick liquids as recommended on MBS if any issues noted with thin; pt demonstrated delayed cough x1 with nectar thick liquids. Given that no aspiration/penetration was observed with nectar thick liquids on MBS, recommend pt initiate dys 3/ nectar thick liquid diet; small sips; slow rate. Speech will follow-up for diet tolerance and possible upgrade.    HPI HPI: [name] is a 81 y.o. female presents with a decline in status. The patients daughter states that she has not been feeling very well for about five days. Patient has had cough and some mucus production. Patient has had some drooling also. The daughter states that she had a stroke and since then she has had slurred speech but otherwise she is relatively functional. On her CXR there is some increased infiltrate suggestive of pneumonia. Pt seen by SLP in 2015, recommended to consume Dys 2/thin due to pocketing on left and for energy conservation.   Pertinent Vitals    SLP Plan  Continue with current plan of care    Recommendations Diet recommendations: Regular;Nectar-thick liquid Liquids provided via: Cup Medication Administration: Whole meds with puree Supervision: Patient able to self feed;Full supervision/cueing for compensatory strategies Compensations: Slow rate;Small sips/bites;Clear throat intermittently Postural Changes and/or Swallow Maneuvers: Seated upright 90 degrees;Upright 30-60 min after meal              Oral Care  Recommendations: Oral care BID Follow up Recommendations: Skilled Nursing facility Plan: Continue with current plan of care    GO     [name] 05/20/2014, 10:22 AM

## 2014-05-20 NOTE — Progress Notes (Signed)
Hypoglycemic Event  CBG: 51   Treatment: 25 ml of D50 IV  Symptoms: None  Follow-up CBG: Time:0400 CBG Result:136  Possible Reasons for Event: Unknown  Comments/MD notified:protocol followed, MD notified    Karin GoldenAdjei-Sakyi, Endrit Gittins  Remember to initiate Hypoglycemia Order Set & complete

## 2014-05-21 ENCOUNTER — Ambulatory Visit: Payer: Medicare Other

## 2014-05-21 LAB — GLUCOSE, CAPILLARY
GLUCOSE-CAPILLARY: 142 mg/dL — AB (ref 70–99)
Glucose-Capillary: 215 mg/dL — ABNORMAL HIGH (ref 70–99)

## 2014-05-21 MED ORDER — INSULIN ASPART 100 UNIT/ML ~~LOC~~ SOLN: 0.0000 [IU] | Freq: Three times a day (TID) | SUBCUTANEOUS | Status: DC

## 2014-05-21 MED ORDER — ACETAMINOPHEN 325 MG PO TABS: 650.0000 mg | ORAL_TABLET | Freq: Four times a day (QID) | ORAL | Status: AC | PRN

## 2014-05-21 MED ORDER — AZITHROMYCIN 500 MG PO TABS: 500.0000 mg | ORAL_TABLET | Freq: Every day | ORAL | Status: DC

## 2014-05-21 MED ORDER — IPRATROPIUM-ALBUTEROL 0.5-2.5 (3) MG/3ML IN SOLN
3.0000 mL | Freq: Four times a day (QID) | RESPIRATORY_TRACT | Status: DC | PRN
  Administered 2014-05-21: 3 mL via RESPIRATORY_TRACT

## 2014-05-21 MED ORDER — ADULT MULTIVITAMIN W/MINERALS CH: 1.0000 | ORAL_TABLET | Freq: Every day | ORAL | Status: DC

## 2014-05-21 MED ORDER — CEFUROXIME AXETIL 500 MG PO TABS: 500.0000 mg | ORAL_TABLET | Freq: Two times a day (BID) | ORAL | Status: DC

## 2014-05-21 MED ORDER — GI COCKTAIL ~~LOC~~
30.0000 mL | Freq: Once | ORAL | Status: DC
  Filled 2014-05-21: qty 30

## 2014-05-21 NOTE — Progress Notes (Signed)
Spoke with both patient's daughter and granddaughter to inform them of patient's plan to [facility].   Called report to A[facility]o receiving RN at [facility]. No additional questions asked. RN verbalized having a full understanding of report.

## 2014-05-21 NOTE — Clinical Social Work Placement (Addendum)
Clinical Social Work Department CLINICAL SOCIAL WORK PLACEMENT NOTE 05/21/2014  Patient:  [name],[name]  Account Number:  [id number] Admit date:  05/18/2014  Clinical Social Worker:  Genelle BalVANESSA Donelle Baba, LCSW  Date/time:  05/21/2014 11:55 AM  Clinical Social Work is seeking post-discharge placement for this patient at the following level of care:   SKILLED NURSING   (*CSW will update this form in Epic as items are completed)     Patient/family provided with [facility] Department of Clinical Social Work's list of facilities offering this level of care within the geographic area requested by the patient (or if unable, by the patient's family).  05/19/2014  Patient/family informed of their freedom to choose among providers that offer the needed level of care, that participate in Medicare, Medicaid or managed care program needed by the patient, have an available bed and are willing to accept the patient.    Patient/family informed of [facility]' ownership interest in [facility], as well as of the fact that they are under no obligation to receive care at this facility.  PASARR submitted to EDS in 2013  PASARR number received in 2013   FL2 transmitted to all facilities in geographic area requested by pt/family on  05/19/2014 FL2 transmitted to all facilities within larger geographic area on   Patient informed that his/her managed care company has contracts with or will negotiate with certain facilities, including the following:     Patient/family informed of bed offers received:   [facility] Physician recommends and patient chooses bed at    [facility] be [facility] on 05/21/14  Patient to be transferred to facility by ambulance Patient and family notified of transfer on 05/21/14 Name of family member notified: Hughes BetterSandra Person, daughter  The following physician request were entered in Epic:  Additional Comments: 05/21/14: CSW  contacted Rene Kocheregina, Insurance claims handleradmissions director at [facility]. Per Rene Kocheregina, she will talk with nursing director and get back with CSW.  05/21/14: CSW received call from [facility] at 12:01 pm advising CSW that they can take patient today. 05/21/14 - 2:27 pm: [facility] director Rene KocherRegina and they have [employer] insurance authorization for patient.     Genelle BalVanessa Delainy Mcelhiney, MSW, LCSW Licensed Clinical Social Worker Clinical Social Work Department [facility] [phone]

## 2014-05-21 NOTE — Care Management Note (Signed)
CARE MANAGEMENT NOTE 05/21/2014  Patient:  [name],[name]   Account Number:  [id number]  Date Initiated:  05/21/2014  Documentation initiated by:  Johny ShockOYAL,Gaylan Fauver  Subjective/Objective Assessment:   CM following for progression and d/c planning.     Action/Plan:   Plan is for SNF placement , CSW, [place] working with family.   Anticipated DC Date:  05/21/2014   Anticipated DC Plan:  SKILLED NURSING FACILITY         Choice offered to / List presented to:             Status of service:  Completed, signed off Medicare Important Message given?  YES (If response is "NO", the following Medicare IM given date fields will be blank) Date Medicare IM given:  05/21/2014 Medicare IM given by:  Sayid Moll Date Additional Medicare IM given:   Additional Medicare IM given by:    Discharge Disposition:  SKILLED NURSING FACILITY  Per UR Regulation:    If discussed at Long Length of Stay Meetings, dates discussed:    Comments:

## 2014-05-21 NOTE — Progress Notes (Signed)
Inpatient Diabetes Program Recommendations  AACE/ADA: New Consensus Statement on Inpatient Glycemic Control (2013)  Target Ranges:  Prepandial:   less than 140 mg/dL      Peak postprandial:   less than 180 mg/dL (1-2 hours)      Critically ill patients:  140 - 180 mg/dL   Results for [name], [name] (MRN [id number]) as of 05/21/2014 08:40  Ref. Range 05/20/2014 08:49 05/20/2014 12:14 05/20/2014 16:56 05/20/2014 22:10 05/21/2014 07:47  Glucose-Capillary Latest Range: 70-99 mg/dL 244133 (H) 010148 (H) 272234 (H) 222 (H) 142 (H)    Reason for assessment: elevated CBG  Diabetes history: Type 2, A1C 7.3%  Outpatient Diabetes medications: Humalog 75/25 33 units qam, 20 units qpm, Glipizide 5mg  bid, Novolog 0-9 units prn for high blood sugars Current orders for Inpatient glycemic control: Novolog moderate correction tid with meals.  Patient did not receive am and noon correction insulin yesterday- family /patient refused lunch time insulin.  Spoke to RN this morning that family may need education on the importance of correction insulin to maintain control- after refusing lunchtime insulin, CBG increased to 234mg /dl  . Agree with current orders for blood sugar control. Fasting CBG ideal today 142mg /dl.   Susette RacerJulie Blaklee Shores, RN, BA, MHA, CDE Diabetes Coordinator Inpatient Diabetes Program  [phone] (Team Pager) [phone] Patrcia Dolly([place] Office) 05/21/2014 8:44 AM

## 2014-05-21 NOTE — Discharge Summary (Signed)
Physician Discharge Summary  [name] ZOX:[id number] DOB: 04-Mar-1918 DOA: 05/18/2014  PCP: [name], MD  Admit date: 05/18/2014 Discharge date: 05/21/2014  Time spent: greater than 30 minutes  Recommendations for Outpatient Follow-up:  1. To SNF 2. Monitor CBG qac, hs, adjust insulin regimen if needed  Discharge Diagnoses:  Principal Problem:   CAP (community acquired pneumonia) Active Problems:   CKD (chronic kidney disease), stage III   DM type 2, uncontrolled, with neuropathy   Hypertensive renal disease   Acute respiratory failure with hypoxemia Deconditioning Chronic dysphagia  Discharge Condition: stable  Diet recommendation: diabetic soft with nectar thickened liquids  Filed Weights   05/18/14 2316 05/19/14 2049 05/20/14 2214  Weight: 69.9 kg (154 lb 1.6 oz) 70.1 kg (154 lb 8.7 oz) 70.398 kg (155 lb 3.2 oz)    History of present illness:  81 y.o. female presents with a decline in status. The patients daughter states that she has not been feeling very well for about five days. Patient has had cough and some mucus production. Patient has had some drooling also. The daughter states that she had a stroke and since then she has had slurred speech but otherwise she is relatively functional. She has noted that she has had more shortness of breath. Patient has had a recent UTI and was on treatment for this. Patients daughter states that she has a major concern for her as there are periods during the day when she is alone in the house. On presentation to the ED she is somewhat somnolent though will converse on occasion and she was noted to be hypoxic. On her CXR there is some increased infiltrate suggestive of pneumonia.  Hospital Course:  Started on rocephin, azithromycin. Initially, hypoxic, improved with [place] oxygen, oxygen.  No fevers, minimal cough, no dyspnea. Tolerating oral antibiotics. Will continue azithromycin, Ceftin for a few more days. See below. His work  with physical therapy and occupational therapy who are recommending skilled nursing facility. She is stable to go today.  She has a history of chronic dysphagia and has been seen by speech therapy who recommend soft diet with nectar thickened liquids.  When started back on her home diabetes regimen, she had problems with hypoglycemia. For now she is on sliding scale NovoLog. Recommend continuing this at skilled nursing facility and consider adjustment if she runs consistently high. Given her age, no need for tight diabetes control.  I have asked nursing staff to wean patient to room air if sats tolerate.  Procedures: None  Consultations:  None  Discharge Exam: Filed Vitals:   05/21/14 0926  BP: 153/83  Pulse: 77  Temp: 98.9 F (37.2 C)  Resp: 20    General: Comfortable. Eating lunch. No respiratory distress. Cooperative. Cardiovascular: Regular rate rhythm without murmurs gallops rubs Respiratory: Clear to auscultation bilaterally without wheezes rhonchi or rales Extremities no clubbing cyanosis or edema  Discharge Instructions   Discharge Instructions    Walk with assistance    Complete by:  As directed           Current Discharge Medication List    START taking these medications   Details  acetaminophen (TYLENOL) 325 MG tablet Take 2 tablets (650 mg total) by mouth every 6 (six) hours as needed for mild pain (or Fever >/= 101).    azithromycin (ZITHROMAX) 500 MG tablet Take 1 tablet (500 mg total) by mouth daily. Through 3/19, then stop Refills: 0    cefUROXime (CEFTIN) 500 MG tablet Take 1 tablet (  500 mg total) by mouth 2 (two) times daily with a meal. Through 3/21, then stop    Multiple Vitamin (MULTIVITAMIN WITH MINERALS) TABS tablet Take 1 tablet by mouth daily.      CONTINUE these medications which have CHANGED   Details  insulin aspart (NOVOLOG) 100 UNIT/ML injection Inject 0-9 Units into the skin 3 (three) times daily with meals. Qty: 10 mL, Refills:  11      CONTINUE these medications which have NOT CHANGED   Details  albuterol (PROVENTIL HFA;VENTOLIN HFA) 108 (90 BASE) MCG/ACT inhaler Inhale 2 puffs into the lungs every 6 (six) hours as needed for wheezing or shortness of breath.    allopurinol (ZYLOPRIM) 100 MG tablet Take 100 mg by mouth 2 (two) times daily.    amLODipine (NORVASC) 10 MG tablet Take 1 tablet (10 mg total) by mouth daily. Qty: 30 tablet, Refills: 3    aspirin EC 81 MG EC tablet Take 1 tablet (81 mg total) by mouth daily.    atorvastatin (LIPITOR) 20 MG tablet TAKE 1 TABLET BY MOUTH EVERY DAY Qty: 30 tablet, Refills: 0    cetirizine (ZYRTEC) 10 MG tablet Take 10 mg by mouth daily.    donepezil (ARICEPT) 10 MG tablet Take 10 mg by mouth at bedtime.     furosemide (LASIX) 20 MG tablet Take 20 mg by mouth daily as needed for fluid.    gabapentin (NEURONTIN) 300 MG capsule Take 300 mg by mouth 3 (three) times daily.     risperiDONE (RISPERDAL) 0.5 MG tablet Take 0.5 mg by mouth 2 (two) times daily as needed. For hallucinations    sertraline (ZOLOFT) 50 MG tablet Take 50 mg by mouth daily.      STOP taking these medications     cephALEXin (KEFLEX) 500 MG capsule      dextromethorphan-guaiFENesin (MUCINEX DM) 30-600 MG per 12 hr tablet      glipiZIDE (GLUCOTROL) 5 MG tablet      HYDROcodone-acetaminophen (NORCO) 10-325 MG per tablet      Insulin Lispro Prot & Lispro (HUMALOG MIX 75/25 KWIKPEN) (75-25) 100 UNIT/ML SUPN        No Known Allergies Follow-up Information    Follow up with [name], MD.   Specialty:  Internal Medicine   Contact information:   [address] [place] [address] [phone]        The results of significant diagnostics from this hospitalization (including imaging, microbiology, ancillary and laboratory) are listed below for reference.    Significant Diagnostic Studies: Dg Chest 2 View  05/12/2014   CLINICAL DATA:  Shortness of breath and wheezing.  EXAM:  CHEST  2 VIEW  COMPARISON:  04/10/2014.  FINDINGS: The heart is enlarged but stable. There is marked tortuosity, ectasia and calcification of the thoracic aorta. Stable calcified mediastinal lymph node. Chronic lung changes without definite acute overlying pulmonary process. The bony thorax is grossly intact.  IMPRESSION: Stable cardiac enlargement and tortuous ectatic thoracic aorta.  Chronic lung changes without definite acute overlying pulmonary process.   Electronically Signed   By: Rudie Meyer M.D.   On: 05/12/2014 22:07   Ct Head (brain) Wo Contrast  05/12/2014   CLINICAL DATA:  Slurred speech and generalized weakness, onset 3 days ago. Difficulty with transverse. Decreased urinary output. Stroke symptoms.  EXAM: CT HEAD WITHOUT CONTRAST  TECHNIQUE: Contiguous axial images were obtained from the base of the skull through the vertex without intravenous contrast.  COMPARISON:  CT head 12/09/2014.  MRI and MRA brain 05/31/2013  FINDINGS: Diffuse cerebral atrophy. Mild ventricular dilatation consistent with central atrophy. Low-attenuation changes throughout the deep white matter consistent with small vessel ischemia. Old lacunar infarct in the right deep white matter. Probable old cerebellar infarcts. No mass effect or midline shift. No abnormal extra-axial fluid collections. Gray-white matter junctions are distinct. Basal cisterns are not effaced. No evidence of acute intracranial hemorrhage. Calcification in the basal ganglia. Prominent vascular calcifications in the internal carotid and basilar arteries. Tortuous basilar artery. 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 Mucosal thickening in the paranasal sinuses with partial opacification of the left maxillary antrum. Mastoid air cells are not opacified. Calvarium appears intact.  IMPRESSION: No acute intracranial abnormalities. Chronic atrophy and small vessel ischemic changes. 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 Chronic inflammatory changes in the paranasal sinuses.   Electronically Signed   By: Burman Nieves M.D.   On: 05/12/2014 20:43   Dg Chest Port 1 View  05/18/2014   CLINICAL DATA:  Presents with 5 days of functional decline-unable to stand, bilateral leg swellng, pink tinged sputum and cough, slurred speech in the evenings, lack of appetite and fatigue.  EXAM: PORTABLE CHEST - 1 VIEW  COMPARISON:  05/12/2014 and 01/27/2014  FINDINGS: Lungs are adequately inflated demonstrate mild bibasilar opacification left worse than right as cannot exclude infection. Stable cardiomegaly. Stable prominence of the aortic arch with tortuosity of the thoracic aorta. Remainder of the exam is unchanged.  IMPRESSION: Bibasilar opacification left worse than right which may represent a pneumonia.  Stable cardiomegaly and stable prominence of the aortic arch.   Electronically Signed   By: Elberta Fortis M.D.   On: 05/18/2014 20:56   Dg Swallowing Func-speech Pathology  05/19/2014    Objective Swallowing Evaluation:    Patient Details  Name: [name] MRN: [id number] Date of Birth: 05/16/1917  Today's Date: 05/19/2014 Time: SLP Start Time (ACUTE ONLY): 1230-SLP Stop Time (ACUTE ONLY): 1242 SLP Time Calculation (min) (ACUTE ONLY): 12 min  Past Medical History:  Past Medical History  Diagnosis Date  . UTI (urinary tract infection)   . Hypertension   . Diabetes mellitus   . Gout   . High cholesterol   . Complication of anesthesia   . Diabetic neuropathy   . PVD (peripheral vascular disease)   . Carotid artery occlusion   . Arthritis   . Chronic kidney disease   . GERD (gastroesophageal reflux disease)   . Vertigo   . Venous insufficiency   . Mood disorder   . Dysphagia   . Hemiparesis and alteration of sensations as late effects of stroke  12/09/2013   Past Surgical History:  Past Surgical History  Procedure Laterality Date  . Hemorrhoid banding    . Abdominal hysterectomy    . Cardiac catheterization    . Lower  extremity angiogram N/A 05/26/2013    Procedure: LOWER EXTREMITY ANGIOGRAM;  Surgeon: Nada Libman, MD;   Location: [facility] CATH LAB;  Service: Cardiovascular;  Laterality: N/A;   HPI:  HPI: Celine Molden is a 81 y.o. female presents with a decline in  status. The patients daughter states that she has not been feeling very  well for about five days. Patient has had cough and some mucus production.  Patient has had some drooling also. The daughter states that she had a  stroke and since then she has had slurred speech but otherwise she is  relatively  functional. On her CXR there is some increased infiltrate  suggestive of pneumonia. Pt seen by SLP in 2015, recommended to consume  Dys 2/thin due to pocketing on left and for energy conservation.  No Data Recorded  Assessment / Plan / Recommendation CHL IP CLINICAL IMPRESSIONS 05/19/2014  Dysphagia Diagnosis Mild oral phase dysphagia;Mild pharyngeal phase  dysphagia   Clinical impression Pt has a mild oropharyngeal dysphagia, with oral phase  overall functional except for inability to transit barium tablet to reach  the pharynx, requiring oral expectoration. She has premature spillage with  all consistencies, with intermittent, frank penetration of thin liquids  that clears with a cued cough. With Min cues for small, single sips, no  further penetration is observed. Of note, pt does not have any material  enter her airway with large, consecutive cup sips of nectar, making this  consistency a viable option should use of compensatory strategies be  difficult. Would recommend to continue with regular diet and thin liquids  with no straws and at least initial full supervision to ensure utilization  of small sips. SLP to follow for diet tolerance.      CHL IP TREATMENT RECOMMENDATION 05/19/2014  Treatment Plan Recommendations Therapy as outlined in treatment plan below      CHL IP DIET RECOMMENDATION 05/19/2014  Diet Recommendations Regular;Thin liquid  Liquid Administration via  Cup;No straw  Medication Administration Crushed with puree  Compensations Slow rate;Small sips/bites;Clear throat intermittently  Postural Changes and/or Swallow Maneuvers Seated upright 90  degrees;Upright 30-60 min after meal     CHL IP OTHER RECOMMENDATIONS 05/19/2014  Recommended Consults MBS  Oral Care Recommendations Oral care BID  Other Recommendations (None)     CHL IP FOLLOW UP RECOMMENDATIONS 05/19/2014  Follow up Recommendations None     CHL IP FREQUENCY AND DURATION 05/19/2014  Speech Therapy Frequency (ACUTE ONLY) min 2x/week  Treatment Duration 1 week     Pertinent Vitals/Pain: N/A     SLP Swallow Goals CHL IP SWALLOW STUDY GOALS 11/23/2011  Patient will consume recommended diet without observed clinical signs of  aspiration with Supervision/safety;Set-up  Swallow Study Goal #1 - Progress (None)  Patient will utilize recommended strategies during swallow to increase  swallowing safety with Minimal assistance      CHL IP REASON FOR REFERRAL 05/19/2014  Reason for Referral Objectively evaluate swallowing function     CHL IP ORAL PHASE 05/19/2014  Oral Phase Impaired  Oral - Nectar Cup Weak lingual manipulation;Incomplete tongue to palate  contact  Oral - Thin Cup Weak lingual manipulation;Incomplete tongue to palate  contact  Oral - Puree Weak lingual manipulation;Incomplete tongue to palate contact   Oral - Mechanical Soft Weak lingual manipulation;Incomplete tongue to  palate contact  Oral - Pill Weak lingual manipulation;Incomplete tongue to palate  contact;Other (Comment)  Oral Phase - Comment (None)                             CHL IP PHARYNGEAL PHASE 05/19/2014  Pharyngeal Phase Impaired  Pharyngeal - Nectar Cup Reduced anterior laryngeal mobility;Reduced  laryngeal elevation;Premature spillage to pyriform sinuses  Pharyngeal - Thin Cup Reduced anterior laryngeal mobility;Reduced  laryngeal elevation;Premature spillage to pyriform  sinuses;Penetration/Aspiration before swallow  Penetration/Aspiration  details (thin cup) Material enters airway, remains  ABOVE vocal cords and not ejected out  Pharyngeal - Puree Premature spillage to valleculae;Reduced anterior  laryngeal mobility;Reduced laryngeal elevation  Penetration/Aspiration details (puree) (None)  Pharyngeal - Mechanical  Soft Reduced anterior laryngeal mobility;Reduced  laryngeal elevation  Pharyngeal - Pill Other (Comment)                                       CHL IP CERVICAL ESOPHAGEAL PHASE 05/19/2014  Cervical Esophageal Phase [facility]         Maxcine Ham, M.A. CCC-SLP [phone]  Maxcine Ham 05/19/2014, 2:15 PM     Microbiology: Recent Results (from the past 240 hour(s))  Urine culture     Status: None   Collection Time: 05/12/14  9:37 PM  Result Value Ref Range Status   Specimen Description URINE, CLEAN CATCH  Final   Special Requests NONE  Final   Colony Count   Final    45,000 COLONIES/ML Performed at [facility]    Culture   Final    Multiple bacterial morphotypes present, none predominant. Suggest appropriate recollection if clinically indicated. Performed at [facility]    Report Status 05/14/2014 FINAL  Final  Blood culture (routine x 2)     Status: None (Preliminary result)   Collection Time: 05/18/14 10:03 PM  Result Value Ref Range Status   Specimen Description BLOOD RIGHT ANTECUBITAL  Final   Special Requests BOTTLES DRAWN AEROBIC AND ANAEROBIC 10CC EA  Final   Culture   Final           BLOOD CULTURE RECEIVED NO GROWTH TO DATE CULTURE WILL BE HELD FOR 5 DAYS BEFORE ISSUING A FINAL NEGATIVE REPORT Note: Culture results may be compromised due to an excessive volume of blood received in culture bottles. Performed at [facility]    Report Status PENDING  Incomplete  Blood culture (routine x 2)     Status: None (Preliminary result)   Collection Time: 05/18/14 10:35 PM  Result Value Ref Range Status   Specimen Description BLOOD RIGHT UPPER ARM  Final   Special Requests BOTTLES  DRAWN AEROBIC AND ANAEROBIC 10CC EA  Final   Culture   Final           BLOOD CULTURE RECEIVED NO GROWTH TO DATE CULTURE WILL BE HELD FOR 5 DAYS BEFORE ISSUING A FINAL NEGATIVE REPORT Note: Culture results may be compromised due to an excessive volume of blood received in culture bottles. Performed at [facility]    Report Status PENDING  Incomplete     Labs: Basic Metabolic Panel:  Recent Labs Lab 05/18/14 2056 05/19/14 0051 05/19/14 0645  NA 145  --  145  K 3.9  --  3.8  CL 104  --  106  CO2 37*  --  34*  GLUCOSE 161*  --  96  BUN 24*  --  24*  CREATININE 1.36* 1.29* 1.29*  CALCIUM 8.6  --  8.2*   Liver Function Tests:  Recent Labs Lab 05/19/14 0645  AST 20  ALT 15  ALKPHOS 62  BILITOT 0.4  PROT 5.4*  ALBUMIN 2.6*   No results for input(s): LIPASE, AMYLASE in the last 168 hours. No results for input(s): AMMONIA in the last 168 hours. CBC:  Recent Labs Lab 05/18/14 2056 05/19/14 0051 05/19/14 0645  WBC 11.4* 9.6 8.3  HGB 12.1 10.6* 10.4*  HCT 39.4 34.7* 34.7*  MCV 93.4 93.5 93.8  PLT 211 201 188   Cardiac Enzymes: No results for input(s): CKTOTAL, CKMB, CKMBINDEX, TROPONINI in the last 168 hours. BNP: BNP (last 3 results)  Recent Labs  05/18/14 2056  BNP 83.8    ProBNP (last 3 results) No results for input(s): PROBNP in the last 8760 hours.  CBG:  Recent Labs Lab 05/20/14 1214 05/20/14 1656 05/20/14 2210 05/21/14 0747 05/21/14 1148  GLUCAP 148* 234* 222* 142* 215*       Signed:  Mykah Bellomo L  [facility] 05/21/2014, 1:41 PM

## 2014-05-21 NOTE — Progress Notes (Signed)
Speech Language Pathology Treatment:    Patient Details Name: [name] MRN: [id number] DOB: [birth year] Today's Date: 05/21/2014 Time: 0930-1000 SLP Time Calculation (min) (ACUTE ONLY): 30 min  Assessment / Plan / Recommendation Clinical Impression  Pt tolerating nectar thick liquids with no evidence of aspiration, in contrast with thin  Trials yesterday in which pt was frequently coughing during meal. Recommend pt continue current diet to SNF where she may progress with therapy. No SLP f/u needed at this time.    HPI HPI: [name] is a 81 y.o. female presents with a decline in status. The patients daughter states that she has not been feeling very well for about five days. Patient has had cough and some mucus production. Patient has had some drooling also. The daughter states that she had a stroke and since then she has had slurred speech but otherwise she is relatively functional. On her CXR there is some increased infiltrate suggestive of pneumonia. Pt seen by SLP in 2015, recommended to consume Dys 2/thin due to pocketing on left and for energy conservation.   Pertinent Vitals    SLP Plan  Continue with current plan of care    Recommendations Diet recommendations: Nectar-thick liquid;Dysphagia 3 (mechanical soft) Liquids provided via: Cup;Straw Medication Administration: Whole meds with puree Supervision: Patient able to self feed;Full supervision/cueing for compensatory strategies Compensations: Slow rate;Small sips/bites Postural Changes and/or Swallow Maneuvers: Seated upright 90 degrees;Upright 30-60 min after meal              Oral Care Recommendations: Oral care BID Follow up Recommendations: Skilled Nursing facility Plan: Continue with current plan of care    GO    [facility] LPBonnie Aika Brzoska, MA CCC-SLP [phone]  Claudine MoutonDeBlois, Jaquane Boughner Caroline 05/21/2014, 2:32 PM

## 2014-05-25 ENCOUNTER — Non-Acute Institutional Stay (SKILLED_NURSING_FACILITY): Payer: Medicare HMO | Admitting: Internal Medicine

## 2014-05-25 ENCOUNTER — Ambulatory Visit: Payer: Medicare Other

## 2014-05-25 DIAGNOSIS — J690 Pneumonitis due to inhalation of food and vomit: Secondary | ICD-10-CM | POA: Diagnosis not present

## 2014-05-25 DIAGNOSIS — N183 Chronic kidney disease, stage 3 unspecified: Secondary | ICD-10-CM

## 2014-05-25 DIAGNOSIS — IMO0002 Reserved for concepts with insufficient information to code with codable children: Secondary | ICD-10-CM

## 2014-05-25 DIAGNOSIS — R410 Disorientation, unspecified: Secondary | ICD-10-CM | POA: Diagnosis not present

## 2014-05-25 DIAGNOSIS — E1165 Type 2 diabetes mellitus with hyperglycemia: Secondary | ICD-10-CM | POA: Diagnosis not present

## 2014-05-25 DIAGNOSIS — E1140 Type 2 diabetes mellitus with diabetic neuropathy, unspecified: Secondary | ICD-10-CM

## 2014-05-25 LAB — CULTURE, BLOOD (ROUTINE X 2)
CULTURE: NO GROWTH
Culture: NO GROWTH

## 2014-05-26 ENCOUNTER — Ambulatory Visit: Payer: Medicare Other

## 2014-06-04 NOTE — Progress Notes (Addendum)
Patient ID: [name], female   DOB: 12-28-1917, 81 y.o.   MRN: [id number]                 HISTORY & PHYSICAL  DATE:  05/25/2014           FACILITY: [facility]       LEVEL OF CARE:   SNF   CHIEF COMPLAINT:  Admission to SNF, post stay at [facility], 05/18/2014 through 05/21/2014.           HISTORY OF PRESENT ILLNESS:  This is a 81 year-old woman who apparently lives at home with her daughter.  I actually know her from a stay at [facility] in May/June 2015.  I gather she has been in this building, as well.    She had presented with a decline in status for roughly five days.  She had not been feeling well, cough with some mucus, and drooling.  She was somnolent on arrival to hospital.  Her chest x-ray suggested some increased infiltrate, suggestive of pneumonia.  She was treated initially with Rocephin and azithromycin for community-acquired pneumonia.  She was hypoxic, which improved with oxygen.  She was switched to oral antibiotics, including azithromycin and Ceftin.    She has a history of chronic dysphagia and has been seen by Speech Therapy, who recommended a soft diet with nectar-thick liquids.    PAST MEDICAL HISTORY/PROBLEM LIST:             UTI.    Hypertension.     Type 2 diabetes.    Gout.     Hypercholesterolemia.    Diabetic neuropathy.    Diabetic peripheral vascular disease.    Carotid artery occlusion.    Arthritis.    Chronic renal disease.    Gastroesophageal reflux disease.    Vertigo.     Venous insufficiency.    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    PAST SURGICAL HISTORY:             Hemorrhoid banding.    Abdominal hysterectomy.    Cardiac catheterization.    Lower extremity angiogram in March 2015.    CURRENT MEDICATIONS:  Discharge medications include:     Tylenol q.6 p.r.n.       Zithromax 500 daily through 05/22/2014.     Ceftin 500 b.i.d. through 05/24/2014.      Novolog insulin sliding scale three times daily.    Proventil 2 puffs q.6 p.r.n. wheezing.     Allopurinol 100 b.i.d.     Norvasc 10 q.d.       Enteric-coated aspirin 81 q.d.          Lipitor 20 q.d.       Zyrtec 10 q.d.       Aricept 10 q.h.s.       Lasix 20 q.d.       Neurontin 300 three times a day.     Risperdal 0.5 two times daily for hallucinations.    Zoloft 50 q.d.       Notable that she was stopped from glipizide, Humalog mix 75/25.         SOCIAL HISTORY:                   HOUSING:  Lives with her daughter, apparently.    FUNCTIONAL STATUS:  She has a hemi walker at home.  I am not exactly sure how functional she is at  this point.   ADVANCED DIRECTIVES:  She does not come with advanced directives.    FAMILY HISTORY:        FATHER:  Positive for diabetes.    SIBLINGS:   Positive for diabetes in a sister.   CHILDREN:  Apparently, a son also has diabetes.    REVIEW OF SYSTEMS:            HEENT:  No headache.   CHEST/RESPIRATORY:  She is not complaining of cough or shortness of breath.     CARDIAC:  No chest pain.    GI:  No abdominal pain.    PHYSICAL EXAMINATION:   VITAL SIGNS:     PULSE:  83, with some irregularity.   RESPIRATIONS:  20 and unlabored.   02 SATURATIONS:  95% on 2 L.   GENERAL APPEARANCE:  The patient is awake and conversational.  Follows directions.   HEENT:   MOUTH/THROAT:  She is edentulous.  No oral lesions are seen.   CHEST/RESPIRATORY:  Impressive crackles on inspiration over most of the left lower lobe greater than right lower lobe.  The upper lobes sound clear, as does the right middle lobe.  There is no wheezing.  No accessory muscle use.       CARDIOVASCULAR:   CARDIAC:  Heart sounds are somewhat irregular-sounding.  Her JVP is not elevated.  There are no signs of heart failure.   GASTROINTESTINAL:   ABDOMEN:  Slightly distended.   LIVER/SPLEEN/KIDNEY:   No liver, no spleen.  No tenderness.    GENITOURINARY:   BLADDER:   Not enlarged.  There is no costovertebral angle tenderness.   VASCULAR:    ARTERIAL:  Extremities:  Peripheral pulses are not palpable in her feet.   CIRCULATION:   EDEMA/VARICOSITIES:  No signs of a DVT.   SKIN:   INSPECTION:  There are no lesions.   NEUROLOGICAL:   SENSATION/STRENGTH:  She appears to have left upper extremity greater than lower extremity weakness versus the right side.   BALANCE/GAIT:  I did not attempt to ambulate her.   PSYCHIATRIC:   MENTAL STATUS:    There are difficulties here.  She could not tell me her address, the year, etc.    ASSESSMENT/PLAN:            Pneumonia.  Treated with IV and then oral antibiotics.  She still has impressive left lower lobe greater than right lower lobe crackles.  I would wonder whether this is recurrent aspiration.    I looked at her chest x-ray which looked fairly benign with some atelectasis present.  I do not know that this would account for the ausculatory findings.  She does not have a history of chronic lung disease that I can see.  Her O2 sat is 95% on 2 L currently.  She does not appear to be in respiratory distress.    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     Type 2 diabetes with many of the complications including peripheral neuropathy, nephropathy (stage III chronic renal failure), carotid artery disease, etc.    History of diabetic PAD with a left toe ulcer.  I treated her for this in the spring.  It appears that this has closed over.    As noted, she has known PAD.    Stage I over the left greater trochanter.  I will make the staff aware of this.    Cognitive impairment/dementia.  I remember this patient as being more cognitively intact  than she appears to be currently, although I wonder if she is recovering from a delirium, as well.    Acute respiratory failure secondary to pneumonia and ?recurrent aspiration.  This will need to be carefully monitored.  I have left her on oxygen for now.    Insulin issues.   Her 75/25 was left off and so was her Glucotrol.  I will need to see her CBGs and make adjustments as necessary.  Tight control is certainly not indicated.    On Risperdal for reasons that are not totally clear.    History of depression.  I do not remember this lady as depressed, although she is on Zoloft currently.    Allergic rhinitis.    Gout.  No evidence that this is active.     CPT CODE: 16109

## 2014-06-11 NOTE — Therapy (Signed)
[facility] [address] [place], [place], [address] Phone: [phone]   Fax:  [phone]  Patient Details  Name: [name] MRN: [id number] Date of Birth: 07-Dec-1917 Referring Provider:  No ref. provider found  Encounter Date: 06/11/2014  PHYSICAL THERAPY DISCHARGE SUMMARY  Visits from Start of Care: 2  Current functional level related to goals / functional outcomes:     PT Long Term Goals - 05/04/14 1644    PT LONG TERM GOAL #1   Title Pt will verbalize understanding of CVA symptoms and risk factors, with cues from family, to decrease risk of another CVA. Target date: 06/23/14.   Status On-going   PT LONG TERM GOAL #2   Title Pt will ambulate 81', with LRAD, over even terrain at MOD I level in order to improve funcitonal mobility. Target date: 06/23/14.   Status On-going   PT LONG TERM GOAL #3   Title Pt will reach 5" outside BOS without LOB, with supervision, in order to perform  ADLs without LOB. Target date: 06/23/14.   Status On-going   PT LONG TERM GOAL #4   Title Pt will perform all transfers (stand pivot and sit<>stand) at MOD I level in order to improve functional mobility. Target date: 06/23/14.   Status On-going   PT LONG TERM GOAL #5   Title Write TUG and gait speed goal if appropriate. Target date: 06/23/14.   Status Partially Met   Additional Long Term Goals   Additional Long Term Goals Yes   PT LONG TERM GOAL #6   Title Pt will perform TUG with LRAD, with supervision, in <120 seconds to decrease falls risk. Target date: 06/23/14.   Status New        Remaining deficits: Unknown, as pt never returned. PT is discharging pt, as pt's daughter cancelled remaining appointments due to pt's decline in health. Pt is now admitted to a SNF.    Education / Equipment: HEP  Plan: Patient agrees to discharge.  Patient goals were not met. Patient is being discharged due to a change in medical status.  ?????        Nhyira Leano L 06/11/2014, 11:43 AM  [facility] [address] [place], [place], [address] Phone: [phone]   Fax:  [phone]   Geoffry Paradise, PT,DPT 06/11/2014 11:43 AM Phone: [phone] Fax: [phone]

## 2014-06-22 ENCOUNTER — Non-Acute Institutional Stay (SKILLED_NURSING_FACILITY): Payer: Medicare HMO | Admitting: Internal Medicine

## 2014-06-22 DIAGNOSIS — E1140 Type 2 diabetes mellitus with diabetic neuropathy, unspecified: Secondary | ICD-10-CM

## 2014-06-22 DIAGNOSIS — J690 Pneumonitis due to inhalation of food and vomit: Secondary | ICD-10-CM

## 2014-06-22 DIAGNOSIS — IMO0002 Reserved for concepts with insufficient information to code with codable children: Secondary | ICD-10-CM

## 2014-06-22 DIAGNOSIS — E1165 Type 2 diabetes mellitus with hyperglycemia: Secondary | ICD-10-CM | POA: Diagnosis not present

## 2014-06-22 DIAGNOSIS — N183 Chronic kidney disease, stage 3 unspecified: Secondary | ICD-10-CM

## 2014-06-27 ENCOUNTER — Emergency Department (HOSPITAL_COMMUNITY): Payer: Medicare HMO

## 2014-06-27 ENCOUNTER — Emergency Department (HOSPITAL_COMMUNITY)
Admission: EM | Admit: 2014-06-27 | Discharge: 2014-06-28 | Disposition: A | Discharge status: Home / Self Care | Payer: Medicare HMO | Attending: Emergency Medicine | Admitting: Emergency Medicine

## 2014-06-27 ENCOUNTER — Encounter (HOSPITAL_COMMUNITY): Payer: Self-pay

## 2014-06-27 DIAGNOSIS — M1990 Unspecified osteoarthritis, unspecified site: Secondary | ICD-10-CM | POA: Diagnosis not present

## 2014-06-27 DIAGNOSIS — S0011XA Contusion of right eyelid and periocular area, initial encounter: Secondary | ICD-10-CM | POA: Diagnosis not present

## 2014-06-27 DIAGNOSIS — Z794 Long term (current) use of insulin: Secondary | ICD-10-CM | POA: Insufficient documentation

## 2014-06-27 DIAGNOSIS — I129 Hypertensive chronic kidney disease with stage 1 through stage 4 chronic kidney disease, or unspecified chronic kidney disease: Secondary | ICD-10-CM | POA: Insufficient documentation

## 2014-06-27 DIAGNOSIS — E1140 Type 2 diabetes mellitus with diabetic neuropathy, unspecified: Secondary | ICD-10-CM | POA: Insufficient documentation

## 2014-06-27 DIAGNOSIS — Y9389 Activity, other specified: Secondary | ICD-10-CM | POA: Diagnosis not present

## 2014-06-27 DIAGNOSIS — Z8719 Personal history of other diseases of the digestive system: Secondary | ICD-10-CM | POA: Insufficient documentation

## 2014-06-27 DIAGNOSIS — S0083XA Contusion of other part of head, initial encounter: Secondary | ICD-10-CM | POA: Insufficient documentation

## 2014-06-27 DIAGNOSIS — Z79899 Other long term (current) drug therapy: Secondary | ICD-10-CM | POA: Insufficient documentation

## 2014-06-27 DIAGNOSIS — E780 Pure hypercholesterolemia: Secondary | ICD-10-CM | POA: Diagnosis not present

## 2014-06-27 DIAGNOSIS — F0390 Unspecified dementia without behavioral disturbance: Secondary | ICD-10-CM | POA: Diagnosis not present

## 2014-06-27 DIAGNOSIS — W19XXXA Unspecified fall, initial encounter: Secondary | ICD-10-CM

## 2014-06-27 DIAGNOSIS — Y92128 Other place in nursing home as the place of occurrence of the external cause: Secondary | ICD-10-CM | POA: Diagnosis not present

## 2014-06-27 DIAGNOSIS — Y998 Other external cause status: Secondary | ICD-10-CM | POA: Diagnosis not present

## 2014-06-27 DIAGNOSIS — Z792 Long term (current) use of antibiotics: Secondary | ICD-10-CM | POA: Diagnosis not present

## 2014-06-27 DIAGNOSIS — S0990XA Unspecified injury of head, initial encounter: Secondary | ICD-10-CM | POA: Diagnosis present

## 2014-06-27 DIAGNOSIS — M109 Gout, unspecified: Secondary | ICD-10-CM | POA: Diagnosis not present

## 2014-06-27 DIAGNOSIS — N189 Chronic kidney disease, unspecified: Secondary | ICD-10-CM | POA: Insufficient documentation

## 2014-06-27 DIAGNOSIS — W07XXXA Fall from chair, initial encounter: Secondary | ICD-10-CM | POA: Diagnosis not present

## 2014-06-27 DIAGNOSIS — Y92129 Unspecified place in nursing home as the place of occurrence of the external cause: Secondary | ICD-10-CM

## 2014-06-27 DIAGNOSIS — Z7982 Long term (current) use of aspirin: Secondary | ICD-10-CM | POA: Diagnosis not present

## 2014-06-27 LAB — CBG MONITORING, ED: Glucose-Capillary: 131 mg/dL — ABNORMAL HIGH (ref 70–99)

## 2014-06-27 NOTE — Progress Notes (Signed)
Patient ID: [name], female   DOB: [birth year], 81 y.o.   MRN: [id number]                PROGRESS NOTE  DATE:  06/22/2014             FACILITY: [facility]                     LEVEL OF CARE:   SNF   Acute Visit/Discharge Visit          CHIEF COMPLAINT:  Pre-discharge review.       HISTORY OF PRESENT ILLNESS:  This is a patient whom I admitted late last month after a stay at [facility] from 05/18/2014 through 05/21/2014.   She was admitted with cough with mucus, drooling.   She had a chest x-ray suggestive of pneumonia.  She was treated.  She was hypoxic, which improved with oxygen.      She was on 75/25 insulin, 33 U in the morning and 25 U a.c. dinner.  This was held.  She was also on Glucotrol 5 mg b.i.d.   This was also held.  She is only on sliding scale here.    She remains on oxygen.    REVIEW OF SYSTEMS:    CHEST/RESPIRATORY:  The patient states she is not short of breath and has had no cough.   CARDIAC:  Denies chest pain.    GI:  No nausea or vomiting.    PHYSICAL EXAMINATION:   VITAL SIGNS:   O2 SATURATIONS:  97% on 2 L.   RESPIRATIONS:    20 and unlabored.   PULSE:     82.      CHEST/RESPIRATORY:  There are still a few crackles in the right lower lobe.  Her left lung is clear.  There is no wheezing.  There is no accessory muscle use.   CARDIOVASCULAR:   CARDIAC:  Heart sounds are normal.  There are no signs of heart failure.  Her JVP is not elevated.      GASTROINTESTINAL:   ABDOMEN:  Soft.     LIVER/SPLEEN/KIDNEYS:  No liver, no spleen.  No tenderness.      PSYCHIATRIC:   MENTAL STATUS:  She is awake, conversational.  Tells me she has been here for many months.  I think her dementia is at baseline.    ASSESSMENT/PLAN:             Pneumonia.  She has completed working with speech therapy.  I am uncertain about her diet recommendations.     She is on oxygen.  I will see if we can taper this off.    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    This appears to be stable.    Type 2 diabetes with many of the complications including peripheral neuropathy, nephropathy.  Blood work had been ordered in the facility.  I will see if I can track this down.      Insulin issues.   I am going to restart her on some of her 75/25.  Her blood sugars here are around 200 fasting, progressively increasing the rest of the day.     CPT CODE: [id number] (greater than 30 minutes spent on this patient with nursing staff and social work)

## 2014-06-27 NOTE — Discharge Instructions (Signed)
Fall Prevention and Home Safety °Falls cause injuries and can affect all age groups. It is possible to use preventive measures to significantly decrease the likelihood of falls. There are many simple measures which can make your home safer and prevent falls. °OUTDOORS °· Repair cracks and edges of walkways and driveways. °· Remove high doorway thresholds. °· Trim shrubbery on the main path into your home. °· Have good outside lighting. °· Clear walkways of tools, rocks, debris, and clutter. °· Check that handrails are not broken and are securely fastened. Both sides of steps should have handrails. °· Have leaves, snow, and ice cleared regularly. °· Use sand or salt on walkways during winter months. °· In the garage, clean up grease or oil spills. °BATHROOM °· Install night lights. °· Install grab bars by the toilet and in the tub and shower. °· Use non-skid mats or decals in the tub or shower. °· Place a plastic non-slip stool in the shower to sit on, if needed. °· Keep floors dry and clean up all water on the floor immediately. °· Remove soap buildup in the tub or shower on a regular basis. °· Secure bath mats with non-slip, double-sided rug tape. °· Remove throw rugs and tripping hazards from the floors. °BEDROOMS °· Install night lights. °· Make sure a bedside light is easy to reach. °· Do not use oversized bedding. °· Keep a telephone by your bedside. °· Have a firm chair with side arms to use for getting dressed. °· Remove throw rugs and tripping hazards from the floor. °KITCHEN °· Keep handles on pots and pans turned toward the center of the stove. Use back burners when possible. °· Clean up spills quickly and allow time for drying. °· Avoid walking on wet floors. °· Avoid hot utensils and knives. °· Position shelves so they are not too high or low. °· Place commonly used objects within easy reach. °· If necessary, use a sturdy step stool with a grab bar when reaching. °· Keep electrical cables out of the  way. °· Do not use floor polish or wax that makes floors slippery. If you must use wax, use non-skid floor wax. °· Remove throw rugs and tripping hazards from the floor. °STAIRWAYS °· Never leave objects on stairs. °· Place handrails on both sides of stairways and use them. Fix any loose handrails. Make sure handrails on both sides of the stairways are as long as the stairs. °· Check carpeting to make sure it is firmly attached along stairs. Make repairs to worn or loose carpet promptly. °· Avoid placing throw rugs at the top or bottom of stairways, or properly secure the rug with carpet tape to prevent slippage. Get rid of throw rugs, if possible. °· Have an electrician put in a light switch at the top and bottom of the stairs. °OTHER FALL PREVENTION TIPS °· Wear low-heel or rubber-soled shoes that are supportive and fit well. Wear closed toe shoes. °· When using a stepladder, make sure it is fully opened and both spreaders are firmly locked. Do not climb a closed stepladder. °· Add color or contrast paint or tape to grab bars and handrails in your home. Place contrasting color strips on first and last steps. °· Learn and use mobility aids as needed. Install an electrical emergency response system. °· Turn on lights to avoid dark areas. Replace light bulbs that burn out immediately. Get light switches that glow. °· Arrange furniture to create clear pathways. Keep furniture in the same place. °·   Firmly attach carpet with non-skid or double-sided tape. °· Eliminate uneven floor surfaces. °· Select a carpet pattern that does not visually hide the edge of steps. °· Be aware of all pets. °OTHER HOME SAFETY TIPS °· Set the water temperature for 120° F (48.8° C). °· Keep emergency numbers on or near the telephone. °· Keep smoke detectors on every level of the home and near sleeping areas. °Document Released: 02/09/2002 Document Revised: 08/21/2011 Document Reviewed: 05/11/2011 °[employer]® Patient Information ©2015  [employer]. This information is not intended to replace advice given to you by your health care provider. Make sure you discuss any questions you have with your health care provider. °Contusion °A contusion is a deep bruise. Contusions are the result of an injury that caused bleeding under the skin. The contusion may turn blue, purple, or yellow. Minor injuries will give you a painless contusion, but more severe contusions may stay painful and swollen for a few weeks.  °CAUSES  °A contusion is usually caused by a blow, trauma, or direct force to an area of the body. °SYMPTOMS  °· Swelling and redness of the injured area. °· Bruising of the injured area. °· Tenderness and soreness of the injured area. °· Pain. °DIAGNOSIS  °The diagnosis can be made by taking a history and physical exam. An X-ray, CT scan, or MRI may be needed to determine if there were any associated injuries, such as fractures. °TREATMENT  °Specific treatment will depend on what area of the body was injured. In general, the best treatment for a contusion is resting, icing, elevating, and applying cold compresses to the injured area. Over-the-counter medicines may also be recommended for pain control. Ask your caregiver what the best treatment is for your contusion. °HOME CARE INSTRUCTIONS  °· Put ice on the injured area. °· Put ice in a plastic bag. °· Place a towel between your skin and the bag. °· Leave the ice on for 15-20 minutes, 3-4 times a day, or as directed by your health care provider. °· Only take over-the-counter or prescription medicines for pain, discomfort, or fever as directed by your caregiver. Your caregiver may recommend avoiding anti-inflammatory medicines (aspirin, ibuprofen, and naproxen) for 48 hours because these medicines may increase bruising. °· Rest the injured area. °· If possible, elevate the injured area to reduce swelling. °SEEK IMMEDIATE MEDICAL CARE IF:  °· You have increased bruising or swelling. °· You have  pain that is getting worse. °· Your swelling or pain is not relieved with medicines. °MAKE SURE YOU:  °· Understand these instructions. °· Will watch your condition. °· Will get help right away if you are not doing well or get worse. °Document Released: 11/29/2004 Document Revised: 02/24/2013 Document Reviewed: 12/25/2010 °Exit[employer]® Patient Information ©2015 [employer]. This information is not intended to replace advice given to you by your health care provider. Make sure you discuss any questions you have with your health care provider. ° °

## 2014-06-27 NOTE — ED Notes (Signed)
[facility] contacted to tx patient back to [facility] 

## 2014-06-27 NOTE — ED Notes (Signed)
Per EMS: Pt at [facility] for [facility]. On day 3 of Levaquin. Fell from chair yesterday ~7:30pm. Struck Rforehead on floor, had a small hematoma at that time. Facility gave tylenol and ice at that time. Today, increased hematoma to R eye. Some trouble reaching for fork at dinner tonight. Denies any pain at this time. H/x dementia, L sided weakness post stroke. Some disphagia, family states is normal at baseline.

## 2014-06-27 NOTE — ED Provider Notes (Signed)
CSN: [id number]     Arrival date & time 06/27/14  1949 History   First MD Initiated Contact with Patient 06/27/14 1955     Chief Complaint  Patient presents with  . Fall     (Consider location/radiation/quality/duration/timing/severity/associated sxs/prior Treatment) Patient is a 81 y.o. female presenting with fall. The history is provided by the nursing home. The history is limited by the condition of the patient (Dementia).  Fall  She  apparently fell out of a chair yesterday and struck her head on the right side of the forehead. She had a hematoma above her right eye which has now spread to involve the entire periorbital area. There is no known loss of consciousness. She does have history of a stroke. She was noted to have trouble reaching for L4 tonight. Patient is not able to give any significant history. According to nursing home report, patient is at baseline mental status.  Past Medical History  Diagnosis Date  . UTI (urinary tract infection)   . Hypertension   . Diabetes mellitus   . Gout   . High cholesterol   . Complication of anesthesia   . Diabetic neuropathy   . PVD (peripheral vascular disease)   . Carotid artery occlusion   . Arthritis   . Chronic kidney disease   . GERD (gastroesophageal reflux disease)   . Vertigo   . Venous insufficiency   . Mood disorder   . Dysphagia   . Hemiparesis and alteration of sensations as late effects of stroke 12/09/2013   Past Surgical History  Procedure Laterality Date  . Hemorrhoid banding    . Abdominal hysterectomy    . Cardiac catheterization    . Lower extremity angiogram N/A 05/26/2013    Procedure: LOWER EXTREMITY ANGIOGRAM;  Surgeon: Nada LibmanVance W Brabham, MD;  [facility];  Service: Cardiovascular;  Laterality: N/A;   Family History  Problem Relation Age of Onset  . Heart disease Daughter   . Hypertension Daughter   . Diabetes Son   . Heart disease Son   . Diabetes Father   . Diabetes Sister   . Diabetes  Sister    History  Substance Use Topics  . Smoking status: Never Smoker   . Smokeless tobacco: Never Used  . Alcohol Use: No   OB History    No data available     Review of Systems  Unable to perform ROS: Dementia      Allergies  Review of patient's allergies indicates no known allergies.  Home Medications   Prior to Admission medications   Medication Sig Start Date End Date Taking? Authorizing Provider  allopurinol (ZYLOPRIM) 100 MG tablet Take 100 mg by mouth 2 (two) times daily.   Yes Historical Provider, MD  amLODipine (NORVASC) 10 MG tablet Take 1 tablet (10 mg total) by mouth daily. 11/26/11  Yes Catarina Hartshornavid Tat, MD  aspirin EC 81 MG EC tablet Take 1 tablet (81 mg total) by mouth daily. 11/26/11  Yes Catarina Hartshornavid Tat, MD  atorvastatin (LIPITOR) 20 MG tablet TAKE 1 TABLET BY MOUTH EVERY DAY 01/18/14  Yes Margit HanksAnne D Alexander, MD  cetirizine (ZYRTEC) 10 MG tablet Take 10 mg by mouth daily.   Yes Historical Provider, MD  donepezil (ARICEPT) 10 MG tablet Take 10 mg by mouth at bedtime.    Yes Historical Provider, MD  furosemide (LASIX) 20 MG tablet Take 20 mg by mouth daily.    Yes Historical Provider, MD  gabapentin (NEURONTIN) 300 MG capsule Take 300  mg by mouth 3 (three) times daily.    Yes Historical Provider, MD  insulin lispro protamine-lispro (HUMALOG 75/25 MIX) (75-25) 100 UNIT/ML SUSP injection Inject into the skin.   Yes Historical Provider, MD  Multiple Vitamin (MULTIVITAMIN WITH MINERALS) TABS tablet Take 1 tablet by mouth daily. 05/21/14  Yes Christiane Ha, MD  risperiDONE (RISPERDAL) 0.5 MG tablet Take 0.5 mg by mouth at bedtime. For hallucinations   Yes Historical Provider, MD  sertraline (ZOLOFT) 50 MG tablet Take 50 mg by mouth daily.   Yes Historical Provider, MD  acetaminophen (TYLENOL) 325 MG tablet Take 2 tablets (650 mg total) by mouth every 6 (six) hours as needed for mild pain (or Fever >/= 101). 05/21/14   Christiane Ha, MD  albuterol (PROVENTIL HFA;VENTOLIN HFA)  108 (90 BASE) MCG/ACT inhaler Inhale 2 puffs into the lungs as needed. 02/28/13 02/28/14  Historical Provider, MD  albuterol (PROVENTIL HFA;VENTOLIN HFA) 108 (90 BASE) MCG/ACT inhaler Inhale 2 puffs into the lungs every 6 (six) hours as needed for wheezing or shortness of breath.    Historical Provider, MD  azithromycin (ZITHROMAX) 500 MG tablet Take 1 tablet (500 mg total) by mouth daily. Through 3/19, then stop 05/21/14   Christiane Ha, MD  cefUROXime (CEFTIN) 500 MG tablet Take 1 tablet (500 mg total) by mouth 2 (two) times daily with a meal. Through 3/21, then stop 05/21/14   Christiane Ha, MD  insulin aspart (NOVOLOG) 100 UNIT/ML injection Inject 0-9 Units into the skin 3 (three) times daily with meals. 05/21/14   Christiane Ha, MD   BP 161/73 mmHg  Pulse 88  Resp 22  SpO2 100% Physical Exam  Nursing note and vitals reviewed.  81 year old female, resting comfortably and in no acute distress. Vital signs are significant for hypertension and tachypnea. Oxygen saturation is 100%, which is normal. Head is normocephalic. Periorbital hematomas present on the right, but eyelid is able to be opened pupils are 2 mm and poorly reactive Oropharynx is clear. Neck is nontender without adenopathy or JVD. Back is nontender and there is no CVA tenderness. Lungs are clear without rales, wheezes, or rhonchi. Chest is nontender. Heart has regular rate and rhythm without murmur. Abdomen is soft, flat, nontender without masses or hepatosplenomegaly and peristalsis is normoactive. Extremities have no cyanosis or edema, full range of motion is present. Skin is warm and dry without rash. Neurologic: She is awake and alert and oriented to person but not place or time, cranial nerves are intact, there are no gross motor or sensory deficits.  ED Course  Procedures (including critical care time) Labs Review Labs Reviewed  CBG MONITORING, ED - Abnormal; Notable for the following:     Glucose-Capillary 131 (*)    All other components within normal limits    Imaging Review Ct Head Wo Contrast  06/27/2014   CLINICAL DATA:  Fall.  Right orbital bruising  EXAM: CT HEAD WITHOUT CONTRAST  CT MAXILLOFACIAL WITHOUT CONTRAST  CT CERVICAL SPINE WITHOUT CONTRAST  TECHNIQUE: Multidetector CT imaging of the head, cervical spine, and maxillofacial structures were performed using the standard protocol without intravenous contrast. Multiplanar CT image reconstructions of the cervical spine and maxillofacial structures were also generated.  COMPARISON:  CT head 05/12/2014  FINDINGS: CT HEAD FINDINGS  Moderate atrophy. Moderate chronic microvascular ischemic change in the white matter and cerebellum. Chronic ischemia in the thalamus bilaterally  Negative for acute infarct.  Negative for hemorrhage or mass.  Extensive atherosclerotic  calcification. Right frontal scalp hematoma. Negative for skull fracture.  CT MAXILLOFACIAL FINDINGS  Negative for facial fracture. Soft tissue hematoma superior to the right orbit and lateral to the right orbit. No orbital fracture. Zygomatic arch is intact. No fracture of the nasal bone or nasal septum. Mandible intact.  No orbital hematoma. Bilateral lens extractions. Mucosal edema in the paranasal sinuses without air-fluid level.  CT CERVICAL SPINE FINDINGS  Normal alignment with straightening of the lordosis. Moderate disc degeneration at C3-4, C5-6, and C6-7 with associated spurring. Mild disc degeneration at C4-5. Mild facet degeneration diffusely in the cervical spine.  Negative for cervical spine fracture  Carotid artery calcification bilaterally.  IMPRESSION: Atrophy and moderate chronic microvascular ischemic change. No acute intracranial abnormality  Negative for facial fracture. Right periorbital soft tissue hematoma.  Cervical degenerative change.  Negative for cervical spine fracture.   Electronically Signed   By: Marlan Palau M.D.   On: 06/27/2014 21:25   Ct  Cervical Spine Wo Contrast  06/27/2014   CLINICAL DATA:  Fall.  Right orbital bruising  EXAM: CT HEAD WITHOUT CONTRAST  CT MAXILLOFACIAL WITHOUT CONTRAST  CT CERVICAL SPINE WITHOUT CONTRAST  TECHNIQUE: Multidetector CT imaging of the head, cervical spine, and maxillofacial structures were performed using the standard protocol without intravenous contrast. Multiplanar CT image reconstructions of the cervical spine and maxillofacial structures were also generated.  COMPARISON:  CT head 05/12/2014  FINDINGS: CT HEAD FINDINGS  Moderate atrophy. Moderate chronic microvascular ischemic change in the white matter and cerebellum. Chronic ischemia in the thalamus bilaterally  Negative for acute infarct.  Negative for hemorrhage or mass.  Extensive atherosclerotic calcification. Right frontal scalp hematoma. Negative for skull fracture.  CT MAXILLOFACIAL FINDINGS  Negative for facial fracture. Soft tissue hematoma superior to the right orbit and lateral to the right orbit. No orbital fracture. Zygomatic arch is intact. No fracture of the nasal bone or nasal septum. Mandible intact.  No orbital hematoma. Bilateral lens extractions. Mucosal edema in the paranasal sinuses without air-fluid level.  CT CERVICAL SPINE FINDINGS  Normal alignment with straightening of the lordosis. Moderate disc degeneration at C3-4, C5-6, and C6-7 with associated spurring. Mild disc degeneration at C4-5. Mild facet degeneration diffusely in the cervical spine.  Negative for cervical spine fracture  Carotid artery calcification bilaterally.  IMPRESSION: Atrophy and moderate chronic microvascular ischemic change. No acute intracranial abnormality  Negative for facial fracture. Right periorbital soft tissue hematoma.  Cervical degenerative change.  Negative for cervical spine fracture.   Electronically Signed   By: Marlan Palau M.D.   On: 06/27/2014 21:25   Ct Maxillofacial Wo Cm  06/27/2014   CLINICAL DATA:  Fall.  Right orbital bruising  EXAM:  CT HEAD WITHOUT CONTRAST  CT MAXILLOFACIAL WITHOUT CONTRAST  CT CERVICAL SPINE WITHOUT CONTRAST  TECHNIQUE: Multidetector CT imaging of the head, cervical spine, and maxillofacial structures were performed using the standard protocol without intravenous contrast. Multiplanar CT image reconstructions of the cervical spine and maxillofacial structures were also generated.  COMPARISON:  CT head 05/12/2014  FINDINGS: CT HEAD FINDINGS  Moderate atrophy. Moderate chronic microvascular ischemic change in the white matter and cerebellum. Chronic ischemia in the thalamus bilaterally  Negative for acute infarct.  Negative for hemorrhage or mass.  Extensive atherosclerotic calcification. Right frontal scalp hematoma. Negative for skull fracture.  CT MAXILLOFACIAL FINDINGS  Negative for facial fracture. Soft tissue hematoma superior to the right orbit and lateral to the right orbit. No orbital fracture. Zygomatic arch is intact.  No fracture of the nasal bone or nasal septum. Mandible intact.  No orbital hematoma. Bilateral lens extractions. Mucosal edema in the paranasal sinuses without air-fluid level.  CT CERVICAL SPINE FINDINGS  Normal alignment with straightening of the lordosis. Moderate disc degeneration at C3-4, C5-6, and C6-7 with associated spurring. Mild disc degeneration at C4-5. Mild facet degeneration diffusely in the cervical spine.  Negative for cervical spine fracture  Carotid artery calcification bilaterally.  IMPRESSION: Atrophy and moderate chronic microvascular ischemic change. No acute intracranial abnormality  Negative for facial fracture. Right periorbital soft tissue hematoma.  Cervical degenerative change.  Negative for cervical spine fracture.   Electronically Signed   By: Marlan Palau M.D.   On: 06/27/2014 21:25    MDM   Final diagnoses:  Fall at nursing home, initial encounter  Contusion of forehead, initial encounter  Periorbital ecchymosis, right, initial encounter    Fall with facial  trauma. Doubt significant injury, but will send for CT scans to evaluate. Spread of supraorbital hematoma to involve periorbital area.  CT scans show no serious injury and she is discharged to return to her nursing home.    Dione Booze, MD 06/27/14 [phone]

## 2014-06-30 ENCOUNTER — Other Ambulatory Visit (HOSPITAL_COMMUNITY): Payer: Self-pay | Admitting: Internal Medicine

## 2014-06-30 DIAGNOSIS — R1314 Dysphagia, pharyngoesophageal phase: Secondary | ICD-10-CM

## 2014-07-06 ENCOUNTER — Ambulatory Visit (HOSPITAL_COMMUNITY)
Admission: RE | Admit: 2014-07-06 | Discharge: 2014-07-06 | Disposition: A | Discharge status: Home / Self Care | Payer: Medicare HMO | Source: Ambulatory Visit | Attending: Internal Medicine | Admitting: Internal Medicine

## 2014-07-06 ENCOUNTER — Non-Acute Institutional Stay (SKILLED_NURSING_FACILITY): Payer: Medicare HMO | Admitting: Internal Medicine

## 2014-07-06 DIAGNOSIS — I739 Peripheral vascular disease, unspecified: Secondary | ICD-10-CM | POA: Insufficient documentation

## 2014-07-06 DIAGNOSIS — Z8701 Personal history of pneumonia (recurrent): Secondary | ICD-10-CM | POA: Diagnosis not present

## 2014-07-06 DIAGNOSIS — R1314 Dysphagia, pharyngoesophageal phase: Secondary | ICD-10-CM

## 2014-07-06 DIAGNOSIS — R1310 Dysphagia, unspecified: Secondary | ICD-10-CM | POA: Insufficient documentation

## 2014-07-06 DIAGNOSIS — I69391 Dysphagia following cerebral infarction: Secondary | ICD-10-CM | POA: Diagnosis not present

## 2014-07-06 DIAGNOSIS — J189 Pneumonia, unspecified organism: Secondary | ICD-10-CM | POA: Diagnosis not present

## 2014-07-06 DIAGNOSIS — E1165 Type 2 diabetes mellitus with hyperglycemia: Secondary | ICD-10-CM

## 2014-07-06 DIAGNOSIS — I69359 Hemiplegia and hemiparesis following cerebral infarction affecting unspecified side: Secondary | ICD-10-CM | POA: Diagnosis not present

## 2014-07-06 DIAGNOSIS — E1140 Type 2 diabetes mellitus with diabetic neuropathy, unspecified: Secondary | ICD-10-CM | POA: Insufficient documentation

## 2014-07-06 DIAGNOSIS — N189 Chronic kidney disease, unspecified: Secondary | ICD-10-CM | POA: Insufficient documentation

## 2014-07-06 DIAGNOSIS — K219 Gastro-esophageal reflux disease without esophagitis: Secondary | ICD-10-CM | POA: Diagnosis not present

## 2014-07-06 DIAGNOSIS — IMO0002 Reserved for concepts with insufficient information to code with codable children: Secondary | ICD-10-CM

## 2014-07-06 NOTE — Therapy (Signed)
Objective Swallowing Evaluation: Other (Comment) (MBS)  Patient Details  Name: [name] MRN: [id number] Date of Birth: April 26, 1917  Today's Date: 07/06/2014 Time: SLP Start Time (ACUTE ONLY): 1118-SLP Stop Time (ACUTE ONLY): 1139 SLP Time Calculation (min) (ACUTE ONLY): 21 min  Past Medical History:  Past Medical History  Diagnosis Date  . UTI (urinary tract infection)   . Hypertension   . Diabetes mellitus   . Gout   . High cholesterol   . Complication of anesthesia   . Diabetic neuropathy   . PVD (peripheral vascular disease)   . Carotid artery occlusion   . Arthritis   . Chronic kidney disease   . GERD (gastroesophageal reflux disease)   . Vertigo   . Venous insufficiency   . Mood disorder   . Dysphagia   . Hemiparesis and alteration of sensations as late effects of stroke 12/09/2013   Past Surgical History:  Past Surgical History  Procedure Laterality Date  . Hemorrhoid banding    . Abdominal hysterectomy    . Cardiac catheterization    . Lower extremity angiogram N/A 05/26/2013    Procedure: LOWER EXTREMITY ANGIOGRAM;  Surgeon: Nada Libman, MD;  Location: [facility] CATH LAB;  Service: Cardiovascular;  Laterality: N/A;   HPI: [name] is a 81 y.o. female who presents for repeat MBS to determine readienss for diet advancement. Previous MBS 05/19/14 during inpatient stay showed intermittent, frank penetration that cleared with a cued cough. Clinically, she was coughing a lot with thin liquids and therefore was downgraded to nectar thick liquids, which was not penetrated during MBS. PMH also includes CVA.   Assessment / Plan / Recommendation CHL IP CLINICAL IMPRESSIONS 07/06/2014  Therapy Diagnosis Mild pharyngeal phase dysphagia   Clinical Impression Pt has a mild pharyngeal dysphagia, although with overall oropharyngeal swallow function within gross functional limits given age and lack of dentition. Mastication is mildly slowed but effective, and swallow trigger  typically occurs at the valleculae with all consistencies tested. Deep, flash penetration occurs with thin liquids. Mixed consistency bolus did result in premature spillage to the pyrifrom sinuses with small amount of frank penetration of thin liquids which cleared with cued coughs.  Slow transit of the barium pill through the esophagus was facilitated with additional bites of puree. Recommend regular diet and thin liquids with general aspiration and esophageal precautions.      CHL IP TREATMENT RECOMMENDATION 07/06/2014  Treatment Recommendations Defer treatment plan to SLP at (Comment)     CHL IP DIET RECOMMENDATION 07/06/2014  SLP Diet Recommendations Age appropriate regular solids;Thin  Liquid Administration via (None)  Medication Administration Crushed with puree  Compensations Slow rate;Small sips/bites;Follow solids with liquid  Postural Changes and/or Swallow Maneuvers (None)     CHL IP OTHER RECOMMENDATIONS 07/06/2014  Recommended Consults (None)  Oral Care Recommendations Oral care BID  Other Recommendations (None)     CHL IP FOLLOW UP RECOMMENDATIONS 05/21/2014  Follow up Recommendations Skilled Nursing facility     [facility] IP FREQUENCY AND DURATION 05/19/2014  Speech Therapy Frequency (ACUTE ONLY) min 2x/week  Treatment Duration 1 week     Pertinent Vitals/Pain: n/a     SLP Swallow Goals  CHL IP REASON FOR REFERRAL 07/06/2014  Reason for Referral Objectively evaluate swallowing function     CHL IP ORAL PHASE 07/06/2014  Oral Phase WFL, given dentition       CHL IP PHARYNGEAL PHASE 07/06/2014  Pharyngeal Phase Impaired - flash penetration with thin liquids; silent, frank penetration with mixed  consistency bolus (pill with thin liquids)              CHL IP CERVICAL ESOPHAGEAL PHASE 07/06/2014  Cervical Esophageal Phase WFL    CHL IP GO 07/06/2014  Functional Assessment Tool Used skilled clinical judgment  Functional Limitations Swallowing  Swallow Current Status (W0981(G8996)  CI  Swallow Goal Status (X9147(G8997) CI  Swallow Discharge Status (W2956(G8998) CI         [name], M.A. CCC-SLP [phone]  [name] Hamaiewonsky, [name] 07/06/2014, 2:08 PM

## 2014-07-09 NOTE — Progress Notes (Addendum)
Patient ID: [name], female   DOB: [birth year], 81 y.o.   MRN: [id number]                PROGRESS NOTE  DATE:  07/06/2014           [facility]                     LEVEL OF CARE:   SNF   Acute Visit                        CHIEF COMPLAINT:  ?Discharge.       HISTORY OF PRESENT ILLNESS:  I saw this patient two weeks ago for the same issue.   Ultimately, she had a fall and was over in the emergency room.    She also, the same day, was discovered to have pneumonia for which she had Levaquin.  The x-ray on 06/22/2014 showed a right upper lobe infiltrate.    Things have not really gone well since then.  Apparently, she is not able to transfer well, requiring 75% assist from bed to wheelchair or wheelchair to toilet.  She apparently was able to walk with a walker before this.  She is not able to do that.    I have reviewed records from the emergency room.  She had a CT scan of the head that only showed microvascular ischemic change.  She also had a CT scan of the C-spine that did not show any acute abnormalities.   She has completed treatment for her pneumonia.    With regards to her diabetes, she currently is on Lispro 75/25, 20 U in the morning and 10 U at night.  Her blood sugars are fairly consistently in the mid to upper 100s; later in the day, mid 200s; and even higher than that later a.c. supper.  I think she probably can use more morning insulin, perhaps slightly more at h.s.    PHYSICAL EXAMINATION:   VITAL SIGNS:     PULSE:  76.      RESPIRATIONS:  20 and unlabored.     02 SATURATIONS:  95% on room air.     CHEST/RESPIRATORY:  Clear air entry bilaterally.    CARDIOVASCULAR:   CARDIAC:  Heart sounds are normal.  There are no murmurs.    GASTROINTESTINAL:   ABDOMEN:  Soft.     LIVER/SPLEEN/KIDNEYS:  No liver, no spleen.  No tenderness.        ASSESSMENT/PLAN:                       Right upper lobe pneumonia on a chest x-ray done in the facility on  06/22/2014.  She has completed antibiotics and seems reasonably asymptomatic.  Consider repeat of the cxr in 4-6 weeks  Fall.  She was seen in the ER on 06/27/2014.   CT scans of the head, cervical spine at that point were unremarkable.    Apparent regression after pneumonia and the fall.  She has chronic left arm paralysis.   However, I cannot really pick up any other lateralizing findings at the bedside.    Type 2 diabetes.  I am going to increase her insulin somewhat as she seems reasonably high at most times of the day.    I think this will be a really questionable discharge.  The patient lives at home with her daughter.  She has a CAP worker for two  hours, seven days a week, but I wonder whether this lady should really be left alone at all.    I have spoken to both therapy departments.  The patient is not stable and has regressed since the events in late April.  I really do not see an obvious reason for this at the bedside, however.

## 2014-07-13 ENCOUNTER — Non-Acute Institutional Stay: Payer: Medicare HMO | Admitting: Internal Medicine

## 2014-07-13 DIAGNOSIS — R0902 Hypoxemia: Secondary | ICD-10-CM

## 2014-07-18 NOTE — Progress Notes (Addendum)
Patient ID: [name], female   DOB: [birth year], 81 y.o.   MRN: [id number]                PROGRESS NOTE  DATE:  07/13/2014          FACILITY: [facility]                          LEVEL OF CARE:   SNF   Acute Visit              CHIEF COMPLAINT:  Vivid hallucinations.        HISTORY OF PRESENT ILLNESS:  This is a patient whom I actually saw for discharge last week.  This is the second time I have seen her for this issue and she does not seem to have gone home.    Last week, I thought her oxygen level was stable.  They tried to take her off, although she dropped her oxygenation into the 80s and she is back on oxygen.  I ordered lab work and a chest x-ray, although I do not see these results.    She has been noted over the last several days to have episodic visual hallucinations such as seeing dead animals on the top of her TV, water on her bed and on the whole floor in her room.  More problematically, there is some suggestion that this has contributed to two falls she has had.  She is on Risperdal 0.5 q.h.s.  I had decreased this from 0.5 b.i.d. when she came in.    In discussing this with her daughter, she carries no premorbid psychiatric history.  Apparently since the beginning of the year, she started having visual hallucinations.  She was put on the Risperdal at 0.5 b.i.d.  The exact history here is uncertain beyond that.    PHYSICAL EXAMINATION:   VITAL SIGNS:     PULSE:  74.    RESPIRATIONS:  20 and unlabored.   02 SATURATIONS:  96% on 2 L.   GENERAL APPEARANCE:  The patient is not in any distress.   CHEST/RESPIRATORY:  Clear air entry bilaterally.    CARDIOVASCULAR:   CARDIAC:  Heart sounds are normal.  There are no murmurs.    GASTROINTESTINAL:   LIVER/SPLEEN/KIDNEYS:  No liver, no spleen.  No tenderness.    NEUROLOGICAL:   I see no new abnormalities here.  She is able to talk to me in a matter of fact way about her seeing things on the top of the TV and  misinterpreting what they are.  In looking at the TV, there is a flower vase on top of this.    ASSESSMENT/PLAN:                  Dementia with behavioral disturbances.  There is not a background psychiatric history here.  She was on antipsychotics before she came here.  The concern here is that the psychosis appears to be contributing to falls.    I am going to put her back on a small dose of a.m. Risperdal.   Lab work and chest x-ray were done.    Continued hypoxemia.  The exact cause of this is uncertain, as well.  Her lungs sound clear.   Need results of cxr ordered last week   CPT CODE: [id number]

## 2014-07-27 ENCOUNTER — Non-Acute Institutional Stay: Payer: Medicare HMO | Admitting: Internal Medicine

## 2014-07-27 DIAGNOSIS — R0902 Hypoxemia: Secondary | ICD-10-CM

## 2014-07-27 DIAGNOSIS — E1165 Type 2 diabetes mellitus with hyperglycemia: Secondary | ICD-10-CM

## 2014-07-27 DIAGNOSIS — E1140 Type 2 diabetes mellitus with diabetic neuropathy, unspecified: Secondary | ICD-10-CM | POA: Diagnosis not present

## 2014-07-27 DIAGNOSIS — IMO0002 Reserved for concepts with insufficient information to code with codable children: Secondary | ICD-10-CM

## 2014-07-27 DIAGNOSIS — F03918 Unspecified dementia, unspecified severity, with other behavioral disturbance: Secondary | ICD-10-CM

## 2014-07-27 DIAGNOSIS — F0391 Unspecified dementia with behavioral disturbance: Secondary | ICD-10-CM | POA: Diagnosis not present

## 2014-08-03 NOTE — Progress Notes (Signed)
Patient ID: [name], female   DOB: 04-15-1917, 81 y.o.   MRN: [id number]                PROGRESS NOTE  DATE:  07/27/2014               [facility]                                 LEVEL OF CARE:   SNF   Acute Visit/Discharge Visit                    CHIEF COMPLAINT:  Pre-discharge review.      HISTORY OF PRESENT ILLNESS:  Now for the third time in roughly the last month, I am trying to make discharge arrangements for this patient.  She was supposed to go home in the middle of April.  She developed hypoxemia.  Chest x-ray suggested right upper lobe infiltrate.    She also started to have visual hallucinations, seeing dead animals on the top of her TV.  Apparently, in talking to her daughter, this is a longstanding issue.  Currently, she is on Risperdal 0.5 q.h.s. and 0.25 q.a.m.       In terms of her O2 sats, I keep on getting 94-96% in both hands with her sitting up in a chair.  She does appear to have an aspiration issue.  I watched her eat at lunch.  Her chest x-ray did show the right upper lobe infiltrate, although there is a statement that this may be "chronic".    PHYSICAL EXAMINATION:   VITAL SIGNS:     PULSE:  68.      RESPIRATIONS:  20 and unlabored.   02 SATURATIONS:  96% on room air.   CHEST/RESPIRATORY:  Reasonably clear air entry bilaterally.   There are no crackles and no wheezes.   CARDIOVASCULAR:   CARDIAC:  Heart sounds are normal.  There are no murmurs.   Her JVP is not elevated.  There is no evidence of heart failure.       CIRCULATION:   EDEMA/VARICOSITIES:  Extremities:  No evidence of a DVT bilaterally.      ASSESSMENT/PLAN:        Dementia with behavioral disturbances.  There is not a background psychiatric history here.  She was on antipsychotics before she came here.  I think this is probably related to her underlying dementia.  She is currently on Risperdal 0.5 q.h.s. and 0.25 q.a.m.  Staff state that the problem is better.  I am going  to discharge her on this.    Hypoxemia.  I have not been able to measure this.  I do not think she is going to need oxygen when she goes home.  This may be related to aspiration.   She does not appear to have underlying heart disease.    Type 2 diabetes.  On insulin.  Her blood sugars appear to be well controlled.  She currently is on Insulin Lispro, Humalog mix 75/25, 20 U before breakfast and 10 U before dinner.  She is also on a NovoLog sliding scale.   However, I do not know that we are going to need this.  I will discharge her only on a 75/25 mix.    History of CVA.  On enteric-coated aspirin.    Hypertension.  This has been stable.    History of gout.  On  allopurinol.  This has been stable.    History of diabetic peripheral vascular disease.  She had wound-related issues last year.  She has not had any while she has been here.

## 2014-08-13 ENCOUNTER — Other Ambulatory Visit (HOSPITAL_COMMUNITY): Payer: Self-pay

## 2014-08-13 ENCOUNTER — Encounter (HOSPITAL_COMMUNITY): Payer: Self-pay | Admitting: Emergency Medicine

## 2014-08-13 ENCOUNTER — Emergency Department (HOSPITAL_COMMUNITY)

## 2014-08-13 ENCOUNTER — Inpatient Hospital Stay (HOSPITAL_COMMUNITY)
Admission: EM | Admit: 2014-08-13 | Discharge: 2014-08-17 | DRG: 871 | Disposition: A | Discharge status: Hospice | Attending: Internal Medicine | Admitting: Internal Medicine

## 2014-08-13 DIAGNOSIS — N183 Chronic kidney disease, stage 3 unspecified: Secondary | ICD-10-CM | POA: Diagnosis present

## 2014-08-13 DIAGNOSIS — F39 Unspecified mood [affective] disorder: Secondary | ICD-10-CM | POA: Diagnosis present

## 2014-08-13 DIAGNOSIS — E876 Hypokalemia: Secondary | ICD-10-CM | POA: Diagnosis not present

## 2014-08-13 DIAGNOSIS — R001 Bradycardia, unspecified: Secondary | ICD-10-CM | POA: Diagnosis present

## 2014-08-13 DIAGNOSIS — M109 Gout, unspecified: Secondary | ICD-10-CM | POA: Diagnosis present

## 2014-08-13 DIAGNOSIS — R32 Unspecified urinary incontinence: Secondary | ICD-10-CM | POA: Diagnosis present

## 2014-08-13 DIAGNOSIS — K219 Gastro-esophageal reflux disease without esophagitis: Secondary | ICD-10-CM | POA: Diagnosis present

## 2014-08-13 DIAGNOSIS — D72829 Elevated white blood cell count, unspecified: Secondary | ICD-10-CM | POA: Diagnosis present

## 2014-08-13 DIAGNOSIS — M1990 Unspecified osteoarthritis, unspecified site: Secondary | ICD-10-CM | POA: Diagnosis present

## 2014-08-13 DIAGNOSIS — I6529 Occlusion and stenosis of unspecified carotid artery: Secondary | ICD-10-CM | POA: Diagnosis present

## 2014-08-13 DIAGNOSIS — R627 Adult failure to thrive: Secondary | ICD-10-CM | POA: Diagnosis present

## 2014-08-13 DIAGNOSIS — I129 Hypertensive chronic kidney disease with stage 1 through stage 4 chronic kidney disease, or unspecified chronic kidney disease: Secondary | ICD-10-CM | POA: Diagnosis present

## 2014-08-13 DIAGNOSIS — E1151 Type 2 diabetes mellitus with diabetic peripheral angiopathy without gangrene: Secondary | ICD-10-CM | POA: Diagnosis present

## 2014-08-13 DIAGNOSIS — L97419 Non-pressure chronic ulcer of right heel and midfoot with unspecified severity: Secondary | ICD-10-CM | POA: Diagnosis present

## 2014-08-13 DIAGNOSIS — J189 Pneumonia, unspecified organism: Secondary | ICD-10-CM | POA: Diagnosis present

## 2014-08-13 DIAGNOSIS — F0390 Unspecified dementia without behavioral disturbance: Secondary | ICD-10-CM | POA: Diagnosis present

## 2014-08-13 DIAGNOSIS — J9601 Acute respiratory failure with hypoxia: Secondary | ICD-10-CM | POA: Diagnosis present

## 2014-08-13 DIAGNOSIS — E780 Pure hypercholesterolemia: Secondary | ICD-10-CM | POA: Diagnosis present

## 2014-08-13 DIAGNOSIS — Z794 Long term (current) use of insulin: Secondary | ICD-10-CM

## 2014-08-13 DIAGNOSIS — E1122 Type 2 diabetes mellitus with diabetic chronic kidney disease: Secondary | ICD-10-CM | POA: Diagnosis present

## 2014-08-13 DIAGNOSIS — E785 Hyperlipidemia, unspecified: Secondary | ICD-10-CM | POA: Diagnosis present

## 2014-08-13 DIAGNOSIS — Z7982 Long term (current) use of aspirin: Secondary | ICD-10-CM | POA: Diagnosis not present

## 2014-08-13 DIAGNOSIS — I5032 Chronic diastolic (congestive) heart failure: Secondary | ICD-10-CM | POA: Diagnosis present

## 2014-08-13 DIAGNOSIS — I69334 Monoplegia of upper limb following cerebral infarction affecting left non-dominant side: Secondary | ICD-10-CM | POA: Diagnosis not present

## 2014-08-13 DIAGNOSIS — I272 Other secondary pulmonary hypertension: Secondary | ICD-10-CM | POA: Diagnosis present

## 2014-08-13 DIAGNOSIS — E1140 Type 2 diabetes mellitus with diabetic neuropathy, unspecified: Secondary | ICD-10-CM | POA: Diagnosis present

## 2014-08-13 DIAGNOSIS — A419 Sepsis, unspecified organism: Secondary | ICD-10-CM | POA: Diagnosis present

## 2014-08-13 DIAGNOSIS — Z90710 Acquired absence of both cervix and uterus: Secondary | ICD-10-CM

## 2014-08-13 DIAGNOSIS — Z79899 Other long term (current) drug therapy: Secondary | ICD-10-CM | POA: Diagnosis not present

## 2014-08-13 DIAGNOSIS — J690 Pneumonitis due to inhalation of food and vomit: Secondary | ICD-10-CM | POA: Diagnosis present

## 2014-08-13 DIAGNOSIS — I10 Essential (primary) hypertension: Secondary | ICD-10-CM | POA: Diagnosis present

## 2014-08-13 DIAGNOSIS — R1310 Dysphagia, unspecified: Secondary | ICD-10-CM | POA: Diagnosis present

## 2014-08-13 DIAGNOSIS — R0902 Hypoxemia: Secondary | ICD-10-CM

## 2014-08-13 DIAGNOSIS — L89611 Pressure ulcer of right heel, stage 1: Secondary | ICD-10-CM | POA: Diagnosis present

## 2014-08-13 DIAGNOSIS — E11649 Type 2 diabetes mellitus with hypoglycemia without coma: Secondary | ICD-10-CM | POA: Diagnosis present

## 2014-08-13 DIAGNOSIS — Z833 Family history of diabetes mellitus: Secondary | ICD-10-CM

## 2014-08-13 DIAGNOSIS — E162 Hypoglycemia, unspecified: Secondary | ICD-10-CM

## 2014-08-13 DIAGNOSIS — Z8249 Family history of ischemic heart disease and other diseases of the circulatory system: Secondary | ICD-10-CM | POA: Diagnosis not present

## 2014-08-13 LAB — GLUCOSE, CAPILLARY: GLUCOSE-CAPILLARY: 100 mg/dL — AB (ref 65–99)

## 2014-08-13 LAB — CBC
HEMATOCRIT: 39.4 % (ref 36.0–46.0)
Hemoglobin: 12.6 g/dL (ref 12.0–15.0)
MCH: 28 pg (ref 26.0–34.0)
MCHC: 32 g/dL (ref 30.0–36.0)
MCV: 87.6 fL (ref 78.0–100.0)
Platelets: 283 10*3/uL (ref 150–400)
RBC: 4.5 MIL/uL (ref 3.87–5.11)
RDW: 17.1 % — AB (ref 11.5–15.5)
WBC: 15.4 10*3/uL — ABNORMAL HIGH (ref 4.0–10.5)

## 2014-08-13 LAB — BASIC METABOLIC PANEL
Anion gap: 10 (ref 5–15)
BUN: 17 mg/dL (ref 6–20)
CO2: 31 mmol/L (ref 22–32)
CREATININE: 1.02 mg/dL — AB (ref 0.44–1.00)
Calcium: 9.2 mg/dL (ref 8.9–10.3)
Chloride: 99 mmol/L — ABNORMAL LOW (ref 101–111)
GFR calc Af Amer: 53 mL/min — ABNORMAL LOW (ref >60)
GFR, EST NON AFRICAN AMERICAN: 46 mL/min — AB (ref >60)
GLUCOSE: 127 mg/dL — AB (ref 65–99)
Potassium: 4.2 mmol/L (ref 3.5–5.1)
Sodium: 140 mmol/L (ref 135–145)

## 2014-08-13 LAB — LACTIC ACID, PLASMA
LACTIC ACID, VENOUS: 1.2 mmol/L (ref 0.5–2.0)
LACTIC ACID, VENOUS: 1.4 mmol/L (ref 0.5–2.0)

## 2014-08-13 LAB — CBG MONITORING, ED
GLUCOSE-CAPILLARY: 61 mg/dL — AB (ref 65–99)
Glucose-Capillary: 98 mg/dL (ref 65–99)

## 2014-08-13 LAB — BRAIN NATRIURETIC PEPTIDE: B Natriuretic Peptide: 158.9 pg/mL — ABNORMAL HIGH (ref 0.0–100.0)

## 2014-08-13 LAB — PROCALCITONIN: Procalcitonin: <0.1 ng/mL

## 2014-08-13 MED ORDER — SERTRALINE HCL 50 MG PO TABS
50.0000 mg | ORAL_TABLET | Freq: Every day | ORAL | Status: DC
  Administered 2014-08-13 – 2014-08-17 (×5): 50 mg via ORAL
  Filled 2014-08-13 (×5): qty 1

## 2014-08-13 MED ORDER — ALBUTEROL SULFATE (2.5 MG/3ML) 0.083% IN NEBU
2.5000 mg | INHALATION_SOLUTION | Freq: Four times a day (QID) | RESPIRATORY_TRACT | Status: DC
  Administered 2014-08-13: 2.5 mg via RESPIRATORY_TRACT
  Filled 2014-08-13: qty 3

## 2014-08-13 MED ORDER — DONEPEZIL HCL 10 MG PO TABS
10.0000 mg | ORAL_TABLET | Freq: Every day | ORAL | Status: DC
  Administered 2014-08-13 – 2014-08-16 (×4): 10 mg via ORAL
  Filled 2014-08-13 (×5): qty 1

## 2014-08-13 MED ORDER — ALBUTEROL SULFATE (2.5 MG/3ML) 0.083% IN NEBU: 3.0000 mL | INHALATION_SOLUTION | Freq: Four times a day (QID) | RESPIRATORY_TRACT | Status: DC | PRN

## 2014-08-13 MED ORDER — INSULIN ASPART 100 UNIT/ML ~~LOC~~ SOLN
0.0000 [IU] | SUBCUTANEOUS | Status: DC
  Administered 2014-08-14: 3 [IU] via SUBCUTANEOUS
  Administered 2014-08-14: 5 [IU] via SUBCUTANEOUS
  Administered 2014-08-15: 3 [IU] via SUBCUTANEOUS
  Administered 2014-08-15 – 2014-08-16 (×4): 2 [IU] via SUBCUTANEOUS
  Administered 2014-08-16: 1 [IU] via SUBCUTANEOUS
  Administered 2014-08-16 (×2): 2 [IU] via SUBCUTANEOUS
  Administered 2014-08-17 (×2): 1 [IU] via SUBCUTANEOUS

## 2014-08-13 MED ORDER — HEPARIN SODIUM (PORCINE) 5000 UNIT/ML IJ SOLN
5000.0000 [IU] | Freq: Three times a day (TID) | INTRAMUSCULAR | Status: DC
  Administered 2014-08-13 – 2014-08-17 (×10): 5000 [IU] via SUBCUTANEOUS
  Filled 2014-08-13 (×14): qty 1

## 2014-08-13 MED ORDER — PIPERACILLIN-TAZOBACTAM 3.375 G IVPB 30 MIN
3.3750 g | Freq: Once | INTRAVENOUS | Status: AC
  Administered 2014-08-13: 3.375 g via INTRAVENOUS
  Filled 2014-08-13: qty 50

## 2014-08-13 MED ORDER — DEXTROSE-NACL 5-0.9 % IV SOLN
INTRAVENOUS | Status: DC
  Administered 2014-08-13 – 2014-08-17 (×2): via INTRAVENOUS

## 2014-08-13 MED ORDER — VANCOMYCIN HCL IN DEXTROSE 1-5 GM/200ML-% IV SOLN
1000.0000 mg | INTRAVENOUS | Status: DC
  Administered 2014-08-13 – 2014-08-14 (×2): 1000 mg via INTRAVENOUS
  Filled 2014-08-13 (×3): qty 200

## 2014-08-13 MED ORDER — RISPERIDONE 0.5 MG PO TABS
0.5000 mg | ORAL_TABLET | Freq: Every day | ORAL | Status: DC
  Administered 2014-08-13 – 2014-08-16 (×4): 0.5 mg via ORAL
  Filled 2014-08-13 (×5): qty 1

## 2014-08-13 MED ORDER — ONDANSETRON HCL 4 MG/2ML IJ SOLN: 4.0000 mg | Freq: Four times a day (QID) | INTRAMUSCULAR | Status: DC | PRN

## 2014-08-13 MED ORDER — FOLIC ACID 5 MG/ML IJ SOLN
1.0000 mg | Freq: Every day | INTRAMUSCULAR | Status: DC
  Administered 2014-08-14 – 2014-08-15 (×2): 1 mg via INTRAVENOUS
  Filled 2014-08-13 (×4): qty 0.2

## 2014-08-13 MED ORDER — THIAMINE HCL 100 MG/ML IJ SOLN
100.0000 mg | Freq: Every day | INTRAMUSCULAR | Status: DC
  Administered 2014-08-14 – 2014-08-16 (×3): 100 mg via INTRAVENOUS
  Filled 2014-08-13 (×3): qty 1

## 2014-08-13 MED ORDER — AMLODIPINE BESYLATE 10 MG PO TABS
10.0000 mg | ORAL_TABLET | Freq: Every day | ORAL | Status: DC
  Administered 2014-08-13 – 2014-08-17 (×4): 10 mg via ORAL
  Filled 2014-08-13 (×5): qty 1

## 2014-08-13 MED ORDER — ONDANSETRON HCL 4 MG PO TABS: 4.0000 mg | ORAL_TABLET | Freq: Four times a day (QID) | ORAL | Status: DC | PRN

## 2014-08-13 MED ORDER — PIPERACILLIN-TAZOBACTAM 3.375 G IVPB
3.3750 g | Freq: Three times a day (TID) | INTRAVENOUS | Status: DC
  Administered 2014-08-13 – 2014-08-15 (×5): 3.375 g via INTRAVENOUS
  Filled 2014-08-13 (×7): qty 50

## 2014-08-13 NOTE — H&P (Addendum)
Patient was seen, examined, treatment plan was discussed with the Physician extender. I have directly reviewed the clinical findings, lab, imaging studies and management of this patient in detail. I have made the necessary changes to the above noted documentation, and agree with the documentation, as recorded by the Physician extender.   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 She was subsequently discharged to SNF (discharged from [facility] 08/02/2014). Pt is not a good historian due to dementia. She presented to [facility] ED with several concerns per her daughter: pt is still with fluctuating mental status, decreased level of activity, poor PO intake and when she does eat patient's daughter has concerns that the patient is choking. Her mental status was even worse today and upon EMS arrival pt's CBG was 34 but has improved with oral glucose, Additionally, her oxygen sat was in 80's once she arrived to ED.  In the ER patient was afebrile, HR was 61-136, RR 16-31, oxygen saturation 89% on [place] oxygen support. Blood work was notable for WBC count of 15.4, creatinine of 1.02. CXR showed right lower lobe opacity concerning for pneumonia. She was started on vanco and zosyn for treatment of HCAP.   Assessment & Plan  Principal Problem: Acute respiratory failure with hypoxia / HCAP, unspecified organism / Aspiration pneumonia - Hypoxia likely due to aspiration pneumonia - PSI/PORT score 174 - Started broad spectrum abx, vanco and zosyn - Follow up blood culture results, legionella, strep pneumonia results, resp culture results - Appreciate SLP evaluation - Aspiration precaution. Minimize PO meds until SLP evaluation completed.  - Continue oxygen support via [place] to keep O2 sat above 90% - Nebulizer treatments as  needed for shortness of breath or wheezing   Active Problems: Sepsis due to pneumonia, unspecified organism / Leukocytosis - Sepsis criteria met on admission with tachycardia, tachypnea, hypoxia, leukocytosis and evidence of infection on CXR - pneumonia - Sepsis order set placed - Check lactic acid and procalcitonin level - Continue vanco and zosyn    Leisa Lenz [facility] [phone]  *For details on A/P please refer to the note below done by Physician extender.    [facility] History and Physical                                                                                    Nyx Karan, is a 81 y.o. female  MRN: [id number]   DOB - 02-Jul-1917  Admit Date - 08/13/2014  Outpatient Primary MD for the patient is Philis Fendt, MD  Referring MD: Reather Converse / ER  With History of -  Past Medical History  Diagnosis Date  . UTI (urinary tract infection)   . Hypertension   . Diabetes mellitus   . Gout   . High cholesterol   . Complication of anesthesia   . Diabetic neuropathy   . PVD (peripheral vascular disease)   . Carotid artery occlusion   . Arthritis   . Chronic kidney disease   . GERD (gastroesophageal reflux disease)   . Vertigo   .  Venous insufficiency   . Mood disorder   . Dysphagia   . Hemiparesis and alteration of sensations as late effects of stroke 12/09/2013      Past Surgical History  Procedure Laterality Date  . Hemorrhoid banding    . Abdominal hysterectomy    . Cardiac catheterization    . Lower extremity angiogram N/A 05/26/2013    Procedure: LOWER EXTREMITY ANGIOGRAM;  Surgeon: Serafina Mitchell, MD;  Location: [facility] CATH LAB;  Service: Cardiovascular;  Laterality: N/A;    in for   Chief Complaint  Patient presents with  . Hypoglycemia  . Bradycardia     HPI This is a 81 year old female patient with past medical history diabetes on insulin and oral hypoglycemic agents, hypertension, gout, diabetic neuropathy, chronic kidney disease stage  III, as well as 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 Patient was last admitted to this facility in March 2016 with community-acquired pneumonia and sent to a local skilled nursing facility for rehabilitative therapy. Patient was initially set to discharge after about 3 weeks of therapy but fell on expected date of discharge so she remained in the nursing facility until May 30 at which time she went home with her daughter. [name] is at bedside and reports that patient has not returned to pre-March admission baseline regarding her physical activity or mentation. In regards to her activity level some days she is able to sit up and get to the side of the bed with minimal assistance and other days she is a full assist. She is nonambulatory. Occasionally she does have some trouble choking with her eating but daughter states that thickened liquids which were recommended at time of discharge from this facility were discontinued at the skilled nursing facility. The daughter reports that yesterday the patient ate very well but this morning she had not. She was given her typical dose of insulin and Glucotrol. Her mentation worsened and daughter called EMS. Upon their arrival EMS noted that the patient's CBG was 34. She was given oral glucose supplementation as well as food and tolerated without evidence of choking or coughing. Upon arrival to the ER by the patient's O2 saturation was 80% and patient is not normally chronically hypoxemic.  In the ER patient was afebrile, she was initially tachycardic with a heart rate of 122 on Dinamap but telemetry revealed heart rate of 77, BP was 115/73, EKG revealed heart rate 60 with sinus etiology and no heart block. She was maintaining room air saturations between 90 and 94%. Electrolyte panel was within baseline limits for this patient, BNP was 159, white count was 15,400 with hemoglobin 12.6 and platelets 293,000.  Glucose was now 127. X-ray revealed an asymmetric right lower lobe opacity concerning for pneumonia. Other pertinent history obtained from the daughter includes recent initiation of antibiotics on June 2 for right heel ulcer with presumed associated cellulitis, recent decrease in stool softener frequency because of loose stools. Daughter also noted in the past 24 hours patient coughing with productive frothy sputum. She has had echocardiogram in 2015 that demonstrated moderate concentric hypertrophy with grade 1 diastolic dysfunction and mild to moderate pulmonary hypertension of 41 mmHg. Patient has been incontinent of urine into diaper since presentation to ER.   Review of Systems   In addition to the HPI above,  No Fever,myalgias or other constitutional symptoms-patient complaining of feeling cold upon my evaluation No Headache, changes with Vision or hearing, new local weakness,  tingling, numbness in any extremity, No parent indigestion/reflux No Chest pain or Shortness of Breath, palpitations, orthopnea or DOE-shin nonambulatory No Abdominal pain, N/V; no melena or hematochezia, no dark tarry stools, Bowel movements are regular, No dysuria, hematuria or flank pain No new skin rashes, lesions, masses or bruises, No new joints pains-aches No recent weight gain or loss No polyuria, polydypsia or polyphagia,  *A full 10 point Review of Systems was done, except as stated above, all other Review of Systems were negative.  Social History History  Substance Use Topics  . Smoking status: Never Smoker   . Smokeless tobacco: Never Used  . Alcohol Use: No    Resides at: Private residence  Lives with: Daughter  Ambulatory status: Nonambulatory   Family History Family History  Problem Relation Age of Onset  . Heart disease Daughter   . Hypertension Daughter   . Diabetes Son   . Heart disease Son   . Diabetes Father   . Diabetes Sister   . Diabetes Sister      Prior to Admission  medications   Medication Sig Start Date End Date Taking? Authorizing Provider  acetaminophen (TYLENOL) 325 MG tablet Take 2 tablets (650 mg total) by mouth every 6 (six) hours as needed for mild pain (or Fever >/= 101). 05/21/14  Yes Delfina Redwood, MD  albuterol (PROVENTIL HFA;VENTOLIN HFA) 108 (90 BASE) MCG/ACT inhaler Inhale 2 puffs into the lungs every 6 (six) hours as needed for wheezing or shortness of breath.   Yes Historical Provider, MD  albuterol (PROVENTIL) (2.5 MG/3ML) 0.083% nebulizer solution Take 2.5 mg by nebulization every 6 (six) hours.    Yes Historical Provider, MD  allopurinol (ZYLOPRIM) 100 MG tablet Take 100 mg by mouth 2 (two) times daily.   Yes Historical Provider, MD  amLODipine (NORVASC) 10 MG tablet Take 1 tablet (10 mg total) by mouth daily. 11/26/11  Yes Orson Eva, MD  aspirin EC 81 MG EC tablet Take 1 tablet (81 mg total) by mouth daily. 11/26/11  Yes Orson Eva, MD  atorvastatin (LIPITOR) 20 MG tablet TAKE 1 TABLET BY MOUTH EVERY DAY 01/18/14  Yes Hennie Duos, MD  cetirizine (ZYRTEC) 10 MG tablet Take 10 mg by mouth daily.   Yes Historical Provider, MD  donepezil (ARICEPT) 10 MG tablet Take 10 mg by mouth at bedtime.    Yes Historical Provider, MD  doxycycline (VIBRAMYCIN) 100 MG capsule Take 100 mg by mouth 2 (two) times daily. Stared on 08-05-14 for 10 days   Yes Historical Provider, MD  furosemide (LASIX) 20 MG tablet Take 20 mg by mouth daily as needed for fluid.    Yes Historical Provider, MD  gabapentin (NEURONTIN) 300 MG capsule Take 300 mg by mouth 3 (three) times daily.    Yes Historical Provider, MD  glipiZIDE (GLUCOTROL) 5 MG tablet Take 5 mg by mouth daily before breakfast.   Yes Historical Provider, MD  insulin aspart (NOVOLOG) 100 UNIT/ML injection Inject 0-1 Units into the skin 3 (three) times daily before meals. Patient uses sliding scale   Yes Historical Provider, MD  insulin lispro protamine-lispro (HUMALOG 75/25 MIX) (75-25) 100 UNIT/ML SUSP  injection Inject 13-28 Units into the skin 2 (two) times daily with a meal. 28 units in the morning and 13 units at bedtime   Yes Historical Provider, MD  Multiple Vitamin (MULTIVITAMIN WITH MINERALS) TABS tablet Take 1 tablet by mouth daily. 05/21/14  Yes Delfina Redwood, MD  sertraline (ZOLOFT) 50 MG tablet  Take 50 mg by mouth daily.   Yes Historical Provider, MD  Wheat Dextrin (BENEFIBER) CHEW Chew 1 tablet by mouth daily.   Yes Historical Provider, MD  insulin lispro protamine-lispro (HUMALOG 75/25 MIX) (75-25) 100 UNIT/ML SUSP injection Inject 10-20 Units into the skin 2 (two) times daily with a meal. 20 units in am and 10 units in pm    Historical Provider, MD  risperiDONE (RISPERDAL) 0.5 MG tablet Take 0.5 mg by mouth at bedtime. For hallucinations    Historical Provider, MD    No Known Allergies  Physical Exam  Vitals  Blood pressure 150/91, pulse 121, temperature 97.6 F (36.4 C), temperature source Oral, resp. rate 21, SpO2 100 %.   General:  In no acute distress, appears chronically ill and debilitated  Psych: Awake but oriented to name only, not agitated and able to verbally respond appropriately  Neuro: CN II through XII intact, Strength 3-4/5 all 4 extremities septal left upper extremity which is contracted and when moved passively elicits complaints of pain, Sensation intact all 4 extremities.  ENT:  Ears and Eyes appear Normal, Conjunctivae clear, PER. Moist oral mucosa without erythema or exudates. Edentulous  Neck:  Supple, No lymphadenopathy appreciated  Respiratory:  Symmetrical chest wall movement, Good air movement bilaterally, crackles right mid field down to base, Room Air  Cardiac:  RRR, No Murmurs, no LE edema noted, no JVD, No carotid bruits, peripheral pulses palpable at 2+  Abdomen:  Positive bowel sounds, Soft, Non tender, non- distended but does appear protuberant,  No masses appreciated, no obvious hepatosplenomegaly  Skin:  No Cyanosis, poor Skin  Turgor, No Skin Rash or Bruise.  Extremities: Symmetrical without obvious trauma or injury,  no effusions.  Data Review  CBC  Recent Labs Lab 08/13/14 1230  WBC 15.4*  HGB 12.6  HCT 39.4  PLT 283  MCV 87.6  MCH 28.0  MCHC 32.0  RDW 17.1*    Chemistries   Recent Labs Lab 08/13/14 1230  NA 140  K 4.2  CL 99*  CO2 31  GLUCOSE 127*  BUN 17  CREATININE 1.02*  CALCIUM 9.2    CrCl cannot be calculated (Unknown ideal weight.).  No results for input(s): TSH, T4TOTAL, T3FREE, THYROIDAB in the last 72 hours.  Invalid input(s): FREET3  Coagulation profile No results for input(s): INR, PROTIME in the last 168 hours.  No results for input(s): DDIMER in the last 72 hours.  Cardiac Enzymes No results for input(s): CKMB, TROPONINI, MYOGLOBIN in the last 168 hours.  Invalid input(s): CK  Invalid input(s): POCBNP  Urinalysis    Component Value Date/Time   COLORURINE YELLOW 05/18/2014 2121   APPEARANCEUR CLOUDY* 05/18/2014 2121   LABSPEC 1.020 05/18/2014 2121   PHURINE 7.5 05/18/2014 2121   GLUCOSEU NEGATIVE 05/18/2014 2121   HGBUR NEGATIVE 05/18/2014 2121   BILIRUBINUR NEGATIVE 05/18/2014 2121   KETONESUR NEGATIVE 05/18/2014 2121   PROTEINUR NEGATIVE 05/18/2014 2121   UROBILINOGEN 0.2 05/18/2014 2121   NITRITE NEGATIVE 05/18/2014 2121   LEUKOCYTESUR SMALL* 05/18/2014 2121    Imaging results:   Dg Chest 2 View  08/13/2014   CLINICAL DATA:  Hypoglycemia.  Bradycardia.  EXAM: CHEST  2 VIEW  COMPARISON:  05/18/2014  FINDINGS: There is a right basilar opacity which is asymmetric and concerning for pneumonia. The opacity is more dense than seen by radiography 05/18/2014. Given a prior abnormality was present in this region, will need follow-up to ensure clearance.  Cardiomegaly and aortic tortuosity/prominence which is stable  from prior.  IMPRESSION: Right lower lobe opacity concerning for pneumonia. Opacity was also present in this region March 2016, and follow-up is  recommended 3-4 weeks after antibiotic trial to ensure clearance.   Electronically Signed   By: Monte Fantasia M.D.   On: 08/13/2014 13:48     EKG: (Independently reviewed) sinus rhythm ventricular rate 60, ATC 597 ms, LAFB   Assessment & Plan  Principal Problem:   Acute respiratory failure with hypoxemia:   A) HCAP    B) Aspiration pneumonia -Admit to telemetry -Begin empiric antibotics to cover for healthcare acquired organisms -Concern chronic recurrent aspiration may be contributing as well -Check blood cultures, urinary legionella and strep as well as sputum culture -Pneumonia protocol typically requests HIV screening but this was done in March and was nonreactive and patient at present has no risk factors for HIV -Continue supportive care with when necessary oxygen and nebulizer treatments -Aspiration evaluation as below  Active Problems:   Diabetes mellitus with hypoglycemia -Seems to be related to likely acute infection as well as progressive decline/failure to thrive in an elderly lady in setting of long acting oral hypoglycemic agents as well as long-acting insulin -Hold long-acting insulin and oral hypoglycemic agent and utilize sliding scale insulin only -Check CBGs 4 hours -Suspect will have at least a 72 hour washout period for the glipizide so will provide dextrose containing fluids    Dysphagia/FTT in adult/dementia -Documented history of dysphagia post stroke and at time of last discharge was recommended to be on nectar thick liquids which apparently were discontinued prior to patient being discharged from rehabilitation -[name] as well as RN at bedside reported that after arrival patient was able to consume an entire meal including thin liquids without difficulty but still there is a concern for possible silent aspiration so we'll continue nothing by mouth status until patient can be formally evaluated by speech therapy -Discussed with daughter that  patient appears to be having progressive decline and she agrees; reinforced that current admission will likely produce further decline-CODE STATUS discussed at length with daughter who at this time wishes to continue patient as full resuscitation -Continue preadmission Aricept and Risperdal, Zoloft -We'll need eventual PT/OT evaluation this admission    Pulmonary hypertension -Noted on echocardiogram 2015 with moderate severity 41 mmHg    Leukocytosis -Suspect either pulmonary or urinary tract etiology -Check urinalysis and culture    Hypertension -Moderate control -Continue preadmission medications with a sip of water pending swallowing evaluation    CKD (chronic kidney disease), stage III -Renal function is stable and at baseline    Chronic diastolic heart failure grade 1 -Since appears to have acute infectious process will hold Lasix for now    Dyslipidemia -Holding Lipitor for now    Bradycardia -Heart rate in the 60s and not definitive bradycardia on EKG    Heel ulcer right foot -Wound care RN evaluation -On doxycycline preadmission but will not continue in favor of broad-spectrum anabiotic as noted above    DVT Prophylaxis: Subcutaneous heparin  Family Communication:   Daughter at bedside  Code Status:  Full code  Condition:  Stable  Discharge disposition: Suspect patient may require readmission to skilled nursing facility after this admission since she becomes very easily debilitated and was quite debilitated prior to admission  Time spent in minutes : 60      ELLIS,ALLISON L. ANP on 08/13/2014 at 4:03 PM  Between 7am to 7pm - Pager - [phone]  After 7pm go to  [email or web address] - password [email or web address]  And look for the night coverage person covering me after hours  [facility]

## 2014-08-13 NOTE — ED Notes (Signed)
Attempted report 

## 2014-08-13 NOTE — Progress Notes (Signed)
Pt received from ED via stretcher to 2w33 accompanied by RN and daughter, alert and oriented, vitals stable, denies pain. Pt oriented to the unit and call bell within reach. Report received via phone from [place], [place], will continue to monitor pt,

## 2014-08-13 NOTE — ED Provider Notes (Signed)
CSN: [id number]     Arrival date & time 08/13/14  1130 History   First MD Initiated Contact with Patient 08/13/14 1138     Chief Complaint  Patient presents with  . Hypoglycemia  . Bradycardia     (Consider location/radiation/quality/duration/timing/severity/associated sxs/prior Treatment) HPI Comments: 81 year old female with history of stroke, high blood pressure, cluster, diabetes, vascular disease, left-sided weakness, pneumonia history presents with low glucose in the walk 2 levels. Patient was recently in rehabilitation and finished antibody for urine infection. Per report patient was given insulin this morning however had nothing to eat, glucose was noted 34 by EMS. Patient had received 28 units of insulin this morning. Patient was given glucose followed by oral intake. Patient tolerated once juice.  Patient denies any significant short of breath or cough however oxygen on arrival made 80s, no home oxygen known. No fevers known.   Patient is a 82 y.o. female presenting with hypoglycemia. The history is provided by the patient.  Hypoglycemia Associated symptoms: no shortness of breath and no vomiting     Past Medical History  Diagnosis Date  . UTI (urinary tract infection)   . Hypertension   . Diabetes mellitus   . Gout   . High cholesterol   . Complication of anesthesia   . Diabetic neuropathy   . PVD (peripheral vascular disease)   . Carotid artery occlusion   . Arthritis   . Chronic kidney disease   . GERD (gastroesophageal reflux disease)   . Vertigo   . Venous insufficiency   . Mood disorder   . Dysphagia   . Hemiparesis and alteration of sensations as late effects of stroke 12/09/2013   Past Surgical History  Procedure Laterality Date  . Hemorrhoid banding    . Abdominal hysterectomy    . Cardiac catheterization    . Lower extremity angiogram N/A 05/26/2013    Procedure: LOWER EXTREMITY ANGIOGRAM;  Surgeon: Nada Libman, MD;  Location: [facility] CATH LAB;  Service:  Cardiovascular;  Laterality: N/A;   Family History  Problem Relation Age of Onset  . Heart disease Daughter   . Hypertension Daughter   . Diabetes Son   . Heart disease Son   . Diabetes Father   . Diabetes Sister   . Diabetes Sister    History  Substance Use Topics  . Smoking status: Never Smoker   . Smokeless tobacco: Never Used  . Alcohol Use: No   OB History    No data available     Review of Systems  Constitutional: Positive for fatigue. Negative for fever and chills.  HENT: Negative for congestion.   Eyes: Negative for visual disturbance.  Respiratory: Negative for shortness of breath.   Cardiovascular: Negative for chest pain.  Gastrointestinal: Negative for vomiting and abdominal pain.  Genitourinary: Negative for dysuria and flank pain.  Musculoskeletal: Negative for back pain, neck pain and neck stiffness.  Skin: Negative for rash.  Neurological: Negative for light-headedness and headaches.      Allergies  Review of patient's allergies indicates no known allergies.  Home Medications   Prior to Admission medications   Medication Sig Start Date End Date Taking? Authorizing Provider  acetaminophen (TYLENOL) 325 MG tablet Take 2 tablets (650 mg total) by mouth every 6 (six) hours as needed for mild pain (or Fever >/= 101). 05/21/14  Yes Christiane Ha, MD  albuterol (PROVENTIL HFA;VENTOLIN HFA) 108 (90 BASE) MCG/ACT inhaler Inhale 2 puffs into the lungs every 6 (six) hours as needed  for wheezing or shortness of breath.   Yes Historical Provider, MD  albuterol (PROVENTIL) (2.5 MG/3ML) 0.083% nebulizer solution Take 2.5 mg by nebulization every 6 (six) hours.    Yes Historical Provider, MD  allopurinol (ZYLOPRIM) 100 MG tablet Take 100 mg by mouth 2 (two) times daily.   Yes Historical Provider, MD  amLODipine (NORVASC) 10 MG tablet Take 1 tablet (10 mg total) by mouth daily. 11/26/11  Yes Catarina Hartshorn, MD  aspirin EC 81 MG EC tablet Take 1 tablet (81 mg total) by  mouth daily. 11/26/11  Yes Catarina Hartshorn, MD  atorvastatin (LIPITOR) 20 MG tablet TAKE 1 TABLET BY MOUTH EVERY DAY 01/18/14  Yes Margit Hanks, MD  cetirizine (ZYRTEC) 10 MG tablet Take 10 mg by mouth daily.   Yes Historical Provider, MD  donepezil (ARICEPT) 10 MG tablet Take 10 mg by mouth at bedtime.    Yes Historical Provider, MD  doxycycline (VIBRAMYCIN) 100 MG capsule Take 100 mg by mouth 2 (two) times daily. Stared on 08-05-14 for 10 days   Yes Historical Provider, MD  furosemide (LASIX) 20 MG tablet Take 20 mg by mouth daily as needed for fluid.    Yes Historical Provider, MD  gabapentin (NEURONTIN) 300 MG capsule Take 300 mg by mouth 3 (three) times daily.    Yes Historical Provider, MD  glipiZIDE (GLUCOTROL) 5 MG tablet Take 5 mg by mouth daily before breakfast.   Yes Historical Provider, MD  insulin aspart (NOVOLOG) 100 UNIT/ML injection Inject 0-1 Units into the skin 3 (three) times daily before meals. Patient uses sliding scale   Yes Historical Provider, MD  insulin lispro protamine-lispro (HUMALOG 75/25 MIX) (75-25) 100 UNIT/ML SUSP injection Inject 13-28 Units into the skin 2 (two) times daily with a meal. 28 units in the morning and 13 units at bedtime   Yes Historical Provider, MD  Multiple Vitamin (MULTIVITAMIN WITH MINERALS) TABS tablet Take 1 tablet by mouth daily. 05/21/14  Yes Christiane Ha, MD  sertraline (ZOLOFT) 50 MG tablet Take 50 mg by mouth daily.   Yes Historical Provider, MD  Wheat Dextrin (BENEFIBER) CHEW Chew 1 tablet by mouth daily.   Yes Historical Provider, MD  insulin lispro protamine-lispro (HUMALOG 75/25 MIX) (75-25) 100 UNIT/ML SUSP injection Inject 10-20 Units into the skin 2 (two) times daily with a meal. 20 units in am and 10 units in pm    Historical Provider, MD  risperiDONE (RISPERDAL) 0.5 MG tablet Take 0.5 mg by mouth at bedtime. For hallucinations    Historical Provider, MD   BP 151/99 mmHg  Pulse 75  Temp(Src) 97.6 F (36.4 C) (Oral)  Resp 31   SpO2 90% Physical Exam  Constitutional: She is oriented to person, place, and time. She appears well-developed.  HENT:  Head: Normocephalic and atraumatic.  Dry mucous membranes  Eyes: Conjunctivae are normal. Right eye exhibits no discharge. Left eye exhibits no discharge.  Neck: Normal range of motion. Neck supple. No tracheal deviation present.  Cardiovascular: Normal rate and regular rhythm.   Pulmonary/Chest: Effort normal. She has rales (bilateral bases).  Abdominal: Soft. She exhibits no distension. There is no tenderness. There is no guarding.  Musculoskeletal: She exhibits no edema.  Neurological: She is alert and oriented to person, place, and time. No cranial nerve deficit.  Gen. weakness worse in the left  Skin: Skin is warm. No rash noted.  Psychiatric: She has a normal mood and affect.  Nursing note and vitals reviewed.   ED  Course  Procedures (including critical care time) Labs Review Labs Reviewed  CBC - Abnormal; Notable for the following:    WBC 15.4 (*)    RDW 17.1 (*)    All other components within normal limits  BASIC METABOLIC PANEL - Abnormal; Notable for the following:    Chloride 99 (*)    Glucose, Bld 127 (*)    Creatinine, Ser 1.02 (*)    GFR calc non Af Amer 46 (*)    GFR calc Af Amer 53 (*)    All other components within normal limits  BRAIN NATRIURETIC PEPTIDE - Abnormal; Notable for the following:    B Natriuretic Peptide 158.9 (*)    All other components within normal limits  CBG MONITORING, ED - Abnormal; Notable for the following:    Glucose-Capillary 61 (*)    All other components within normal limits  URINE CULTURE  HEMOGLOBIN A1C  URINALYSIS, ROUTINE W REFLEX MICROSCOPIC (NOT AT [facility])  CBG MONITORING, ED    Imaging Review Dg Chest 2 View  08/13/2014   CLINICAL DATA:  Hypoglycemia.  Bradycardia.  EXAM: CHEST  2 VIEW  COMPARISON:  05/18/2014  FINDINGS: There is a right basilar opacity which is asymmetric and concerning for pneumonia.  The opacity is more dense than seen by radiography 05/18/2014. Given a prior abnormality was present in this region, will need follow-up to ensure clearance.  Cardiomegaly and aortic tortuosity/prominence which is stable from prior.  IMPRESSION: Right lower lobe opacity concerning for pneumonia. Opacity was also present in this region March 2016, and follow-up is recommended 3-4 weeks after antibiotic trial to ensure clearance.   Electronically Signed   By: Marnee Spring M.D.   On: 08/13/2014 13:48     EKG Interpretation None     EKG reviewed heart rate 60, T-wave inversions anterior, sinus, normal QT MDM   Final diagnoses:  HCAP (healthcare-associated pneumonia)  Hypoxia  Hypoglycemia   Patient presents with hypoglycemia, history consistent with decreased appetite/missed meal followed by normal insulin dose.  Concern for rales on exam and hypoxia, chest x-ray reviewed by myself consistent with pneumonia.  Palliative care had been recently involved. Plan for further discussion with family and palliative care regarding antibody accident admission to the hospital.  Long discussion with daughter, patient has had multiple difficulties with low glucose and mild medicine adjustment. Patient's glucose improved. Patient tolerated oral however concern for medical management.  Discussed with [facility] for pneumonia hospital-acquired and glucose management.  The patients results and plan were reviewed and discussed.   Any x-rays performed were personally reviewed by myself.   Differential diagnosis were considered with the presenting HPI.  Medications  piperacillin-tazobactam (ZOSYN) IVPB 3.375 g (not administered)  insulin aspart (novoLOG) injection 0-9 Units (not administered)  dextrose 5 %-0.9 % sodium chloride infusion (not administered)    Filed Vitals:   08/13/14 1345 08/13/14 1400 08/13/14 1412 08/13/14 1430  BP: 140/64 155/84 155/84 151/99  Pulse: 99 70 81 75   Temp:      TempSrc:      Resp:  SpO2: 100% 90% 91% 90%    Final diagnoses:  HCAP (healthcare-associated pneumonia)  Hypoxia  Hypoglycemia    Admission/ observation were discussed with the admitting physician, patient and/or family and they are comfortable with the plan.     Blane Ohara, MD 08/13/14 1538

## 2014-08-13 NOTE — ED Notes (Signed)
Pt cbg 98  

## 2014-08-13 NOTE — ED Notes (Addendum)
Pt from home via [facility] with c/o hypoglycemia.  Pt was given her morning dose of 28 units of insulin, last PO intake was last pm.  Initial CBG 34, pt given 1.5 slices of peanut butter bread, 6 oz orange juice, and 25 g instaglucose by EMS.  Pt daughter states since pt has been home from rehab the last 9 days she has noted pt's CBG to be low in the mornings.  Last CBG by EMS was 77.  EMS also noted pt to be bradycardic at 47.  Fire reported pt SpO2 to be 81%, placed on nonrebreather increased to 86%.  Pt was 97% on 2L with EMS.  Pt in NAD, A&O, appropriate at baseline per daughter.

## 2014-08-13 NOTE — Progress Notes (Signed)
ANTIBIOTIC CONSULT NOTE - INITIAL  Pharmacy Consult for vanc Indication: pneumonia  No Known Allergies  Patient Measurements:    Vital Signs: Temp: 97.6 F (36.4 C) (06/10 1137) Temp Source: Oral (06/10 1137) BP: 151/99 mmHg (06/10 1430) Pulse Rate: 75 (06/10 1430) Intake/Output from previous day:   Intake/Output from this shift:    Labs:  Recent Labs  08/13/14 1230  WBC 15.4*  HGB 12.6  PLT 283  CREATININE 1.02*   CrCl cannot be calculated (Unknown ideal weight.). No results for input(s): VANCOTROUGH, VANCOPEAK, VANCORANDOM, GENTTROUGH, GENTPEAK, GENTRANDOM, TOBRATROUGH, TOBRAPEAK, TOBRARND, AMIKACINPEAK, AMIKACINTROU, AMIKACIN in the last 72 hours.   Microbiology: No results found for this or any previous visit (from the past 720 hour(s)).  Medical History: Past Medical History  Diagnosis Date  . UTI (urinary tract infection)   . Hypertension   . Diabetes mellitus   . Gout   . High cholesterol   . Complication of anesthesia   . Diabetic neuropathy   . PVD (peripheral vascular disease)   . Carotid artery occlusion   . Arthritis   . Chronic kidney disease   . GERD (gastroesophageal reflux disease)   . Vertigo   . Venous insufficiency   . Mood disorder   . Dysphagia   . Hemiparesis and alteration of sensations as late effects of stroke 12/09/2013    Assessment: 81 yof with CC hypoglycemia and bradycardia. Recently in rehab and finished abx for UTI.  Pharmacy consulted to dose vanc/zosyn for aspiration pna. Afebrile, wbc 15.4.  6/10 vanc>> 6/10 zosyn>>  6/10 UC>>  Goal of Therapy:  Vancomycin trough level 15-20 mcg/ml  Plan:  Zosyn 3.375g IV x 1 (30 min inf); then zosyn 3.375g IV q8h  Vanc 1g IV q24h  Mon clinical progress, c/s, renal function, abx plan  Babs Bertin, PharmD Clinical Pharmacist - Resident Pager [phone] 08/13/2014 3:40 PM

## 2014-08-13 NOTE — ED Notes (Signed)
Patient transported to X-ray 

## 2014-08-13 NOTE — ED Notes (Addendum)
Pt given orange juice, sandwich, and apple sauce.  Notified Dr Jodi Mourning of inability to start IV.

## 2014-08-14 LAB — GLUCOSE, CAPILLARY
GLUCOSE-CAPILLARY: 137 mg/dL — AB (ref 65–99)
GLUCOSE-CAPILLARY: 228 mg/dL — AB (ref 65–99)
GLUCOSE-CAPILLARY: 96 mg/dL (ref 65–99)
Glucose-Capillary: 72 mg/dL (ref 65–99)
Glucose-Capillary: 71 mg/dL (ref 65–99)

## 2014-08-14 LAB — COMPREHENSIVE METABOLIC PANEL
ALT: 9 U/L — AB (ref 14–54)
ANION GAP: 9 (ref 5–15)
AST: 15 U/L (ref 15–41)
Albumin: 2.5 g/dL — ABNORMAL LOW (ref 3.5–5.0)
Alkaline Phosphatase: 62 U/L (ref 38–126)
BUN: 16 mg/dL (ref 6–20)
CO2: 33 mmol/L — AB (ref 22–32)
CREATININE: 1.09 mg/dL — AB (ref 0.44–1.00)
Calcium: 8.8 mg/dL — ABNORMAL LOW (ref 8.9–10.3)
Chloride: 101 mmol/L (ref 101–111)
GFR, EST AFRICAN AMERICAN: 49 mL/min — AB (ref >60)
GFR, EST NON AFRICAN AMERICAN: 42 mL/min — AB (ref >60)
Glucose, Bld: 73 mg/dL (ref 65–99)
Potassium: 3.4 mmol/L — ABNORMAL LOW (ref 3.5–5.1)
Sodium: 143 mmol/L (ref 135–145)
Total Bilirubin: 0.5 mg/dL (ref 0.3–1.2)
Total Protein: 6.4 g/dL — ABNORMAL LOW (ref 6.5–8.1)

## 2014-08-14 LAB — CBC
HCT: 32.7 % — ABNORMAL LOW (ref 36.0–46.0)
HEMOGLOBIN: 10.4 g/dL — AB (ref 12.0–15.0)
MCH: 27.7 pg (ref 26.0–34.0)
MCHC: 31.8 g/dL (ref 30.0–36.0)
MCV: 87 fL (ref 78.0–100.0)
PLATELETS: 209 10*3/uL (ref 150–400)
RBC: 3.76 MIL/uL — ABNORMAL LOW (ref 3.87–5.11)
RDW: 17.5 % — AB (ref 11.5–15.5)
WBC: 10.5 10*3/uL (ref 4.0–10.5)

## 2014-08-14 LAB — MRSA PCR SCREENING: MRSA by PCR: NEGATIVE

## 2014-08-14 MED ORDER — TRAMADOL HCL 50 MG PO TABS
50.0000 mg | ORAL_TABLET | Freq: Once | ORAL | Status: AC
  Administered 2014-08-14: 50 mg via ORAL
  Filled 2014-08-14: qty 1

## 2014-08-14 MED ORDER — POTASSIUM CHLORIDE CRYS ER 20 MEQ PO TBCR
40.0000 meq | EXTENDED_RELEASE_TABLET | Freq: Once | ORAL | Status: AC
  Administered 2014-08-14: 40 meq via ORAL
  Filled 2014-08-14: qty 2

## 2014-08-14 NOTE — Progress Notes (Signed)
PT Cancellation Note  Patient Details Name: [name] MRN: [id number] DOB: [birth year]   Cancelled Treatment:    Reason Eval/Treat Not Completed: Other (comment) (Spoke with RN) Patient currently agitated. Refusing IV team. Spoke with RN who recommended returning at a later time. Will follow up as time allows, likely tomorrow for comprehensive PT evaluation.  Berton Mount 08/14/2014, 6:03 PM Charlsie Merles, [place] [phone]

## 2014-08-14 NOTE — Progress Notes (Addendum)
[facility] PROGRESS NOTE  [name] RUE:[id number] DOB: [birth year] DOA: 08/13/2014 PCP: [name], MD  Assessment/Plan: 1. Probable aspiration pneumonia. -Patient having a history of CVA and dysphagia. She had a modified barium swallow study performed on 07/06/2014 that was negative for aspiration. She was evaluated by speech pathology today as there are concerns for intermittent aspiration with thin liquids. Speech pathology recommended dysphasia 3 with nectar thickened liquids. -Will discontinue IV vancomycin as I believe this likely reflects an aspiration pneumonia. Thus we'll continue Zosyn IV. -She seems improved this morning, hemodynamically stable, continue monitoring, provide supportive care. -Repeat chest x-ray in a.m.  2.  Acute hypoxemic respiratory failure -Evidenced by O2 sats in the 80s on admission, secondary to aspiration pneumonia. -Patient having overall improvement this morning.  3.  Hypokalemia. -Potassium at 3.4 on a.m. lab work, will provide dose of KDur  4.  Advanced dementia. -She currently resides at home with her family. -Will monitor closely for delirium -I would be for her to return home with family members when medically stable. -Speech pathology has recommended nectar thickened liquids with dysphagia 3 -Physical therapy consult  5.  Hypertension. -Blood pressure stable at 138/68, continue Norvasc 10 mg by mouth daily   Code Status: Full code Family Communication:  Disposition Plan: Anticipate discharge: Medically stable    Antibiotics:  Zosyn IV  HPI/Subjective: Patient is a pleasant 81 year old female with a past medical history of stage III chronic disease, dementia, hypertension, history of dysphagia who was admitted to the medicine service on 08/12/2014. She was brought to the emergency department by family members who reported failure to thrive/functional decline, as patient has had decreased level of activity, poor by  mouth intake, requiring greater assistance. Family members at also expressed concerns with patient choking on her food. On presentation she was found to be hypoglycemic with a blood sugar of 34. She was also found to be in hypoxemic respiratory failure having O2 sats in the 80s. Chest x-ray revealed a right lower lobe opacity concerning for pneumonia. Given history of CVA and dysphagia suspect this likely represents an aspiration pneumonia.  Objective: Filed Vitals:   08/14/14 1411  BP: 138/68  Pulse: 60  Temp: 97.7 F (36.5 C)  Resp: 18    Intake/Output Summary (Last 24 hours) at 08/14/14 1633 Last data filed at 08/14/14 1000  Gross per 24 hour  Intake     50 ml  Output      1 ml  Net     49 ml   Filed Weights   08/13/14 1810  Weight: 60.827 kg (134 lb 1.6 oz)    Exam:   General:  Patient is awake and alert, no acute distress, has no complaints and wants to go home  Cardiovascular: Regular rate and rhythm normal S1-S2 no murmurs rubs or gallops  Respiratory: She has normal respiratory effort, having a few bibasilar crackles, possibly worse on right base  Abdomen: Soft nontender nondistended  Musculoskeletal: No edema  Data Reviewed: Basic Metabolic Panel:  Recent Labs Lab 08/13/14 1230 08/14/14 0503  NA 140 143  K 4.2 3.4*  CL 99* 101  CO2 31 33*  GLUCOSE 127* 73  BUN 17 16  CREATININE 1.02* 1.09*  CALCIUM 9.2 8.8*   Liver Function Tests:  Recent Labs Lab 08/14/14 0503  AST 15  ALT 9*  ALKPHOS 62  BILITOT 0.5  PROT 6.4*  ALBUMIN 2.5*   No results for input(s): LIPASE, AMYLASE in the last 168 hours. No results  for input(s): AMMONIA in the last 168 hours. CBC:  Recent Labs Lab 08/13/14 1230 08/14/14 0503  WBC 15.4* 10.5  HGB 12.6 10.4*  HCT 39.4 32.7*  MCV 87.6 87.0  PLT 283 209   Cardiac Enzymes: No results for input(s): CKTOTAL, CKMB, CKMBINDEX, TROPONINI in the last 168 hours. BNP (last 3 results)  Recent Labs  05/18/14 2056  08/13/14 1230  BNP 83.8 158.9*    ProBNP (last 3 results) No results for input(s): PROBNP in the last 8760 hours.  CBG:  Recent Labs Lab 08/14/14 0007 08/14/14 0358 08/14/14 0807 08/14/14 1119 08/14/14 1627  GLUCAP 96 72 71 137* 228*    Recent Results (from the past 240 hour(s))  MRSA PCR Screening     Status: None   Collection Time: 08/14/14  4:36 AM  Result Value Ref Range Status   MRSA by PCR NEGATIVE NEGATIVE Final    Comment:        The GeneXpert MRSA Assay (FDA approved for NASAL specimens only), is one component of a comprehensive MRSA colonization surveillance program. It is not intended to diagnose MRSA infection nor to guide or monitor treatment for MRSA infections.      Studies: Dg Chest 2 View  08/13/2014   CLINICAL DATA:  Hypoglycemia.  Bradycardia.  EXAM: CHEST  2 VIEW  COMPARISON:  05/18/2014  FINDINGS: There is a right basilar opacity which is asymmetric and concerning for pneumonia. The opacity is more dense than seen by radiography 05/18/2014. Given a prior abnormality was present in this region, will need follow-up to ensure clearance.  Cardiomegaly and aortic tortuosity/prominence which is stable from prior.  IMPRESSION: Right lower lobe opacity concerning for pneumonia. Opacity was also present in this region March 2016, and follow-up is recommended 3-4 weeks after antibiotic trial to ensure clearance.   Electronically Signed   By: Marnee Spring M.D.   On: 08/13/2014 13:48    Scheduled Meds: . amLODipine  10 mg Oral Daily  . donepezil  10 mg Oral QHS  . folic acid  1 mg Intravenous Daily  . heparin  5,000 Units Subcutaneous 3 times per day  . insulin aspart  0-9 Units Subcutaneous 6 times per day  . piperacillin-tazobactam (ZOSYN)  IV  3.375 g Intravenous 3 times per day  . risperiDONE  0.5 mg Oral QHS  . sertraline  50 mg Oral Daily  . thiamine  100 mg Intravenous Daily  . vancomycin  1,000 mg Intravenous Q24H   Continuous Infusions: .  dextrose 5 % and 0.9% NaCl 50 mL/hr at 08/13/14 1613    Principal Problem:   Acute respiratory failure with hypoxemia Active Problems:   Hypertension   Diabetic neuropathy   Dyslipidemia   CKD (chronic kidney disease) stage 3, GFR 30-59 ml/min   HCAP (healthcare-associated pneumonia)   Dysphagia   Aspiration pneumonia   FTT (failure to thrive) in adult   Pulmonary hypertension   Leukocytosis   Chronic diastolic heart failure, NYHA class 1   Ulcer of right heel   Dementia   Sepsis due to pneumonia    Time spent: 35 minutes    Jeralyn Bennett  [facility] Pager [phone]. If 7PM-7AM, please contact night-coverage at [email or web address], password [facility] 08/14/2014, 4:33 PM  LOS: 1 day

## 2014-08-14 NOTE — Evaluation (Signed)
Clinical/Bedside Swallow Evaluation Patient Details  Name: [name] MRN: [id number] Date of Birth: 07-Nov-1917  Today's Date: 08/14/2014 Time: SLP Start Time (ACUTE ONLY): 0915 SLP Stop Time (ACUTE ONLY): 0942 SLP Time Calculation (min) (ACUTE ONLY): 27 min  Past Medical History:  Past Medical History  Diagnosis Date  . UTI (urinary tract infection)   . Hypertension   . Diabetes mellitus   . Gout   . High cholesterol   . Complication of anesthesia   . Diabetic neuropathy   . PVD (peripheral vascular disease)   . Carotid artery occlusion   . Arthritis   . Chronic kidney disease   . GERD (gastroesophageal reflux disease)   . Vertigo   . Venous insufficiency   . Mood disorder   . Dysphagia   . Hemiparesis and alteration of sensations as late effects of stroke 12/09/2013   Past Surgical History:  Past Surgical History  Procedure Laterality Date  . Hemorrhoid banding    . Abdominal hysterectomy    . Cardiac catheterization    . Lower extremity angiogram N/A 05/26/2013    Procedure: LOWER EXTREMITY ANGIOGRAM;  Surgeon: Nada Libman, MD;  Location: 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 Chest x-ray 08-13-14 showed right lower lobe opacity concerning for pneumonia. Opacity was also present in this region March 2016.    Assessment / Plan / Recommendation Clinical Impression  Pt with a history of dysphagia and clinically presenting with signs and symptoms of aspiration this date following thin liquid by cup. Pt with immediate overt cough, watery eyes, change in vocal quality, and frequent throat clearing. Although recent MBS on 07/06/14 only showed deep penetratrion of thin liquids with no aspiration,  suspect that pt is having intermittent aspiration with thin liquids as her chest x ray is positive again for right lower lobe infiltrates and she is evidencing overt signs clinically. Prior to NPO pt was consuming thin liquid and regular diet. Pts poor mentation, impulsivity with self feeding, and decreased insight to deficits puts her at increased risk for continued aspiraiton.  No signs or symptoms of aspiration with nectar thick liquids. Recommend dysphagia 3 and nectar thick liquid diet. Medicines whole with nectar thick liquids. ST follow up warranted.      Aspiration Risk  Moderate    Diet Recommendation Dysphagia 3 (Mech soft);Nectar   Medication Administration: Whole meds with liquid (nectar thick ) Compensations: Small sips/bites;Slow rate    Other  Recommendations Oral Care Recommendations: Oral care BID Other Recommendations: Order thickener from pharmacy   Follow Up Recommendations       Frequency and Duration min 2x/week  2 weeks   Pertinent Vitals/Pain     SLP Swallow Goals     Swallow Study Prior Functional Status       General Date of Onset: 08/13/14 Other Pertinent Information: 81 year old female with past medical history of diabetes, dementia, hypertension, gout, chronic kidney disease stage III, 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 Chest x-ray 08-13-14 showed right lower lobe opacity concerning for pneumonia. Opacity was also present in this region March 2016.  Type of Study: Bedside swallow evaluation Diet Prior to this Study: NPO Temperature Spikes Noted: Yes Respiratory  Status: Supplemental O2 delivered via (comment) (nasal cannula 2 L ) History of Recent Intubation: No Behavior/Cognition: Impulsive;Confused;Alert Oral Cavity - Dentition: Edentulous Self-Feeding Abilities: Able to feed self;Needs assist Patient Positioning: Upright in bed Baseline Vocal Quality: Low  vocal intensity;Normal Volitional Cough: Strong Volitional Swallow: Able to elicit    Oral/Motor/Sensory Function Overall Oral Motor/Sensory Function: Impaired at baseline Labial Strength: Reduced Lingual ROM: Reduced left Lingual Symmetry: Within Functional Limits Lingual Strength: Reduced Facial ROM: Reduced left Facial Strength: Reduced   Ice Chips Ice chips: Not tested (Pt refused)   Thin Liquid Thin Liquid: Impaired Presentation: Cup;Straw;Self Fed Oral Phase Impairments: Reduced labial seal;Reduced lingual movement/coordination Oral Phase Functional Implications: Oral residue;Prolonged oral transit Pharyngeal  Phase Impairments: Suspected delayed Swallow;Throat Clearing - Delayed;Cough - Immediate (watery eyes)    Nectar Thick Nectar Thick Liquid: Within functional limits Presentation: Self Fed   Honey Thick Honey Thick Liquid: Not tested   Puree Puree: Within functional limits   Solid   GO    Solid: Impaired Presentation: Self Fed Oral Phase Impairments: Reduced lingual movement/coordination;Impaired mastication Oral Phase Functional Implications: Oral residue Pharyngeal Phase Impairments: Throat Clearing - Immediate;Multiple swallows      Marcene Duos MA, CCC-SLP Acute Care Speech Language Pathologist    Marcene Duos E 08/14/2014,10:05 AM

## 2014-08-14 NOTE — Progress Notes (Signed)
GIP visit- [name]- [facility] [id number] and [name] RN  Related covered admission to her hospice diagnosis of Alzheimers, dsyphagia. Pt is a FULL CODE. Patient is alert and oriented to person and situation. Patient denies any pain but states that she has not eaten and that she is hungry, staff nurse notified. No issues noted at this time, IV antibiotics infusing.   Please call [phone] for any hospice needs Thank you Ulis Rias RN [facility] hospital liaison

## 2014-08-15 ENCOUNTER — Inpatient Hospital Stay (HOSPITAL_COMMUNITY)

## 2014-08-15 LAB — GLUCOSE, CAPILLARY
GLUCOSE-CAPILLARY: 259 mg/dL — AB (ref 65–99)
GLUCOSE-CAPILLARY: 106 mg/dL — AB (ref 65–99)
GLUCOSE-CAPILLARY: 131 mg/dL — AB (ref 65–99)
Glucose-Capillary: 175 mg/dL — ABNORMAL HIGH (ref 65–99)
Glucose-Capillary: 153 mg/dL — ABNORMAL HIGH (ref 65–99)
Glucose-Capillary: 140 mg/dL — ABNORMAL HIGH (ref 65–99)

## 2014-08-15 MED ORDER — RESOURCE THICKENUP CLEAR PO POWD
ORAL | Status: DC | PRN
  Filled 2014-08-15: qty 125

## 2014-08-15 MED ORDER — AMOXICILLIN-POT CLAVULANATE 875-125 MG PO TABS: 1.0000 | ORAL_TABLET | Freq: Two times a day (BID) | ORAL | Status: DC

## 2014-08-15 MED ORDER — AMOXICILLIN-POT CLAVULANATE 500-125 MG PO TABS
1.0000 | ORAL_TABLET | Freq: Two times a day (BID) | ORAL | Status: DC
  Administered 2014-08-15 – 2014-08-17 (×4): 500 mg via ORAL
  Filled 2014-08-15 (×5): qty 1

## 2014-08-15 NOTE — Evaluation (Signed)
Physical Therapy Evaluation Patient Details Name: [name] MRN: [id number] DOB: 10/24/1917 Today's Date: 08/15/2014   History of Present Illness  Patient admitted with probably aspiration PNA.  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 She was subsequently discharged to SNF (discharged from [facility] 08/02/2014)  Clinical Impression  Patient has been fairly dependent since recent admission and family has been caring for patient.  Patient continue to require increased assistance for mobility and transfers.  Will benefit from PT while in hospital to return to baseline for transfers.  Family feels they can continue to provide this level of support once discharged.      Follow Up Recommendations No PT follow up (pt cared for by hospice)    Equipment Recommendations  None recommended by PT    Recommendations for Other Services       Precautions / Restrictions Precautions Precautions: Fall      Mobility  Bed Mobility Overal bed mobility: Needs Assistance Bed Mobility: Rolling;Sidelying to Sit Rolling: Mod assist Sidelying to sit: Mod assist          Transfers Overall transfer level: Needs assistance Equipment used: None Transfers: Squat Pivot Transfers     Squat pivot transfers: Max assist     General transfer comment: patient unable to assist with power up to squat position for transfer  Ambulation/Gait                Stairs            Wheelchair Mobility    Modified Rankin (Stroke Patients Only)       Balance Overall balance assessment: Needs assistance Sitting-balance support: No upper extremity supported;Feet supported Sitting balance-Leahy Scale: Fair Sitting balance - Comments: able to sit EOB for several minutes without asssitance                                      Pertinent Vitals/Pain Pain Assessment: No/denies pain    Home Living Family/patient expects to be discharged to:: Private residence Living Arrangements: Children Available Help at Discharge: Family;Friend(s)             Additional Comments: Unable to get living situation from patient; patient does live with daughter who cares for her.    Prior Function Level of Independence: Needs assistance   Gait / Transfers Assistance Needed: per daughter, requires assistance for tranfers and bed mobility; unable to ambulate           Hand Dominance        Extremity/Trunk Assessment   Upper Extremity Assessment: LUE deficits/detail       LUE Deficits / Details: no active movement noted   Lower Extremity Assessment: LLE deficits/detail   LLE Deficits / Details: trace movement note in knee extension and ankle pumps.     Communication   Communication: Other (comment) (difficult to understand)  Cognition Arousal/Alertness: Awake/alert Behavior During Therapy: WFL for tasks assessed/performed Overall Cognitive Status: History of cognitive impairments - at baseline                      General Comments      Exercises        Assessment/Plan    PT Assessment Patient needs continued PT services  PT Diagnosis Hemiplegia non-dominant side  PT Problem List Decreased activity tolerance;Decreased mobility;Decreased balance;Decreased cognition  PT Treatment Interventions Functional mobility training;Therapeutic activities;Balance training;Patient/family education   PT Goals (Current goals can be found in the Care Plan section) Acute Rehab PT Goals Patient Stated Goal: none stated PT Goal Formulation: With family Time For Goal Achievement: 08/22/14 Potential to Achieve Goals: Fair    Frequency Min 2X/week   Barriers to discharge        Co-evaluation               End of Session Equipment Utilized During Treatment: Gait  belt Activity Tolerance: Patient tolerated treatment well Patient left: in chair;with call bell/phone within reach Nurse Communication: Mobility status;Need for lift equipment         Time: 0920-0950 PT Time Calculation (min) (ACUTE ONLY): 30 min   Charges:   PT Evaluation $Initial PT Evaluation Tier I: 1 Procedure PT Treatments $Therapeutic Activity: 8-22 mins   PT G Codes:        Olivia Canter 08/15/2014, 9:58 AM  08/15/2014 Corlis Hove, PT [phone]

## 2014-08-15 NOTE — Progress Notes (Signed)
Multiple skin tear noted on Patients Coccyx upon  assessment, pt's daughter reports client has had wound/skin tear on coccyx and was receiving treatment before admission to the hospital. Allevyn Foam dressing applied to skin area.

## 2014-08-15 NOTE — Consult Note (Signed)
WOC wound consult note Reason for Consult: Right heel wound Wound type:sDTI (supected deep tissue injury) right heel and noted to have area on the left lateral malleolus also Pressure Ulcer POA: Yes Measurement: Right heel: 1cm x 2.5cm x 0 Left lateral malleolus: 0.5cm x 0.5cm x  Wound JKD:TOIZ areas are dark, maroon tissue, not currently open, no fluctuant  Drainage (amount, consistency, odor) none Periwound: intact Dressing procedure/placement/frequency: Prevalon boots for offloading the bilateral heels.  No topical care at this time as offloading is most beneficial in prevention of evolution of these wounds into full thickness wounds.   Discussed POC with patient and bedside nurse.  Re consult if needed, will not follow at this time. Thanks  Aniella Wandrey [employer], CWOCN [phone])

## 2014-08-15 NOTE — Progress Notes (Signed)
[facility] PROGRESS NOTE  [name] BJY:[id number] DOB: 29-Nov-1917 DOA: 08/13/2014 PCP: [name], MD  Assessment/Plan: 1. Probable aspiration pneumonia. -Patient having a history of CVA and dysphagia. She had a modified barium swallow study performed on 07/06/2014 that was negative for aspiration. She was evaluated by speech pathology today as there are concerns for intermittent aspiration with thin liquids. Speech pathology recommended dysphasia 3 with nectar thickened liquids. -Discontinued IV vancomycin as I believe this likely reflects an aspiration pneumonia.  -Repeat chest x-ray showing improved right base airspace disease. -Will transition to Augmentin 875 mg by mouth twice a day  2.  Acute hypoxemic respiratory failure -Evidenced by O2 sats in the 80s on admission, secondary to aspiration pneumonia. -Patient showing clinical improvement as chest x-ray demonstrates resolving right basilar airspace disease  3.  Advanced dementia. -She currently resides at home with her family. -Will monitor closely for delirium -I would be for her to return home with family members when medically stable. -Speech pathology has recommended nectar thickened liquids with dysphagia 3 -Physical therapy consulted  5.  Hypertension. -Blood pressure stable, continue Norvasc 10 mg by mouth daily   Code Status: Full code Family Communication:  Disposition Plan: Transitioning to oral microbial therapy, despite discharged on a every 4 hours    Antibiotics:  Zosyn IV discontinued on 08/07/2014  HPI/Subjective: Patient is a pleasant 81 year old female with a past medical history of stage III chronic disease, dementia, hypertension, history of dysphagia who was admitted to the medicine service on 08/12/2014. She was brought to the emergency department by family members who reported failure to thrive/functional decline, as patient has had decreased level of activity, poor by mouth  intake, requiring greater assistance. Family members at also expressed concerns with patient choking on her food. On presentation she was found to be hypoglycemic with a blood sugar of 34. She was also found to be in hypoxemic respiratory failure having O2 sats in the 80s. Chest x-ray revealed a right lower lobe opacity concerning for pneumonia. Given history of CVA and dysphagia suspect this likely represents an aspiration pneumonia.  Objective: Filed Vitals:   08/15/14 1043  BP: 137/65  Pulse: 69  Temp:   Resp:     Intake/Output Summary (Last 24 hours) at 08/15/14 1329 Last data filed at 08/14/14 1838  Gross per 24 hour  Intake    240 ml  Output      0 ml  Net    240 ml   Filed Weights   08/13/14 1810 08/15/14 0611  Weight: 60.827 kg (134 lb 1.6 oz) 61.961 kg (136 lb 9.6 oz)    Exam:   General:  Patient is awake and alert, no acute distress, sitting at bedside chair  Cardiovascular: Regular rate and rhythm normal S1-S2 no murmurs rubs or gallops  Respiratory: She has normal respiratory effort, having a few bibasilar crackles, possibly worse on right base  Abdomen: Soft nontender nondistended  Musculoskeletal: No edema  Data Reviewed: Basic Metabolic Panel:  Recent Labs Lab 08/13/14 1230 08/14/14 0503  NA 140 143  K 4.2 3.4*  CL 99* 101  CO2 31 33*  GLUCOSE 127* 73  BUN 17 16  CREATININE 1.02* 1.09*  CALCIUM 9.2 8.8*   Liver Function Tests:  Recent Labs Lab 08/14/14 0503  AST 15  ALT 9*  ALKPHOS 62  BILITOT 0.5  PROT 6.4*  ALBUMIN 2.5*   No results for input(s): LIPASE, AMYLASE in the last 168 hours. No results for input(s): AMMONIA  in the last 168 hours. CBC:  Recent Labs Lab 08/13/14 1230 08/14/14 0503  WBC 15.4* 10.5  HGB 12.6 10.4*  HCT 39.4 32.7*  MCV 87.6 87.0  PLT 283 209   Cardiac Enzymes: No results for input(s): CKTOTAL, CKMB, CKMBINDEX, TROPONINI in the last 168 hours. BNP (last 3 results)  Recent Labs  05/18/14 2056  08/13/14 1230  BNP 83.8 158.9*    ProBNP (last 3 results) No results for input(s): PROBNP in the last 8760 hours.  CBG:  Recent Labs Lab 08/14/14 2020 08/15/14 0042 08/15/14 0431 08/15/14 0749 08/15/14 1135  GLUCAP 259* 175* 106* 153* 131*    Recent Results (from the past 240 hour(s))  Culture, blood (routine x 2)     Status: None (Preliminary result)   Collection Time: 08/13/14  4:50 PM  Result Value Ref Range Status   Specimen Description BLOOD RIGHT HAND  Final   Special Requests BOTTLES DRAWN AEROBIC ONLY 2CC  Final   Culture   Final           BLOOD CULTURE RECEIVED NO GROWTH TO DATE CULTURE WILL BE HELD FOR 5 DAYS BEFORE ISSUING A FINAL NEGATIVE REPORT Performed at [facility]    Report Status PENDING  Incomplete  Culture, blood (routine x 2)     Status: None (Preliminary result)   Collection Time: 08/13/14  4:55 PM  Result Value Ref Range Status   Specimen Description BLOOD ARM LEFT  Final   Special Requests BOTTLES DRAWN AEROBIC AND ANAEROBIC 5CC  Final   Culture   Final           BLOOD CULTURE RECEIVED NO GROWTH TO DATE CULTURE WILL BE HELD FOR 5 DAYS BEFORE ISSUING A FINAL NEGATIVE REPORT Performed at [facility]    Report Status PENDING  Incomplete  MRSA PCR Screening     Status: None   Collection Time: 08/14/14  4:36 AM  Result Value Ref Range Status   MRSA by PCR NEGATIVE NEGATIVE Final    Comment:        The GeneXpert MRSA Assay (FDA approved for NASAL specimens only), is one component of a comprehensive MRSA colonization surveillance program. It is not intended to diagnose MRSA infection nor to guide or monitor treatment for MRSA infections.      Studies: Dg Chest 2 View  08/13/2014   CLINICAL DATA:  Hypoglycemia.  Bradycardia.  EXAM: CHEST  2 VIEW  COMPARISON:  05/18/2014  FINDINGS: There is a right basilar opacity which is asymmetric and concerning for pneumonia. The opacity is more dense than seen by radiography  05/18/2014. Given a prior abnormality was present in this region, will need follow-up to ensure clearance.  Cardiomegaly and aortic tortuosity/prominence which is stable from prior.  IMPRESSION: Right lower lobe opacity concerning for pneumonia. Opacity was also present in this region March 2016, and follow-up is recommended 3-4 weeks after antibiotic trial to ensure clearance.   Electronically Signed   By: Marnee Spring M.D.   On: 08/13/2014 13:48   Dg Chest Port 1 View  08/15/2014   CLINICAL DATA:  Aspiration pneumonia.  EXAM: PORTABLE CHEST - 1 VIEW  COMPARISON:  08/13/2014  FINDINGS: Patient rotated minimally right. Moderate cardiomegaly with a tortuous thoracic aorta. No pleural effusion or pneumothorax. Low lung volumes. This accentuates the pulmonary vascularity. Mild superimposed pulmonary venous congestion cannot be excluded. Improved right base aeration.  IMPRESSION: Improved right base airspace disease.  Cardiomegaly with low lung volumes. Possible concurrent  mild pulmonary venous congestion.   Electronically Signed   By: Jeronimo Greaves M.D.   On: 08/15/2014 08:37    Scheduled Meds: . amLODipine  10 mg Oral Daily  . donepezil  10 mg Oral QHS  . folic acid  1 mg Intravenous Daily  . heparin  5,000 Units Subcutaneous 3 times per day  . insulin aspart  0-9 Units Subcutaneous 6 times per day  . piperacillin-tazobactam (ZOSYN)  IV  3.375 g Intravenous 3 times per day  . risperiDONE  0.5 mg Oral QHS  . sertraline  50 mg Oral Daily  . thiamine  100 mg Intravenous Daily   Continuous Infusions: . dextrose 5 % and 0.9% NaCl 50 mL/hr at 08/13/14 1613    Principal Problem:   Acute respiratory failure with hypoxemia Active Problems:   Hypertension   Diabetic neuropathy   Dyslipidemia   CKD (chronic kidney disease) stage 3, GFR 30-59 ml/min   HCAP (healthcare-associated pneumonia)   Dysphagia   Aspiration pneumonia   FTT (failure to thrive) in adult   Pulmonary hypertension    Leukocytosis   Chronic diastolic heart failure, NYHA class 1   Ulcer of right heel   Dementia   Sepsis due to pneumonia    Time spent: 25 minutes    Jeralyn Bennett  [facility] Pager [phone]. If 7PM-7AM, please contact night-coverage at [email or web address], password [facility] 08/15/2014, 1:29 PM  LOS: 2 days

## 2014-08-16 DIAGNOSIS — R1310 Dysphagia, unspecified: Secondary | ICD-10-CM

## 2014-08-16 DIAGNOSIS — R627 Adult failure to thrive: Secondary | ICD-10-CM

## 2014-08-16 LAB — HEMOGLOBIN A1C
HEMOGLOBIN A1C: 7.7 % — AB (ref 4.8–5.6)
Mean Plasma Glucose: 174 mg/dL

## 2014-08-16 LAB — GLUCOSE, CAPILLARY
GLUCOSE-CAPILLARY: 245 mg/dL — AB (ref 65–99)
GLUCOSE-CAPILLARY: 121 mg/dL — AB (ref 65–99)
GLUCOSE-CAPILLARY: 125 mg/dL — AB (ref 65–99)
GLUCOSE-CAPILLARY: 181 mg/dL — AB (ref 65–99)
GLUCOSE-CAPILLARY: 152 mg/dL — AB (ref 65–99)
Glucose-Capillary: 178 mg/dL — ABNORMAL HIGH (ref 65–99)
Glucose-Capillary: 179 mg/dL — ABNORMAL HIGH (ref 65–99)

## 2014-08-16 MED ORDER — RESOURCE THICKENUP CLEAR PO POWD: 1.0000 g | ORAL | Status: DC | PRN

## 2014-08-16 MED ORDER — FOLIC ACID 1 MG PO TABS
1.0000 mg | ORAL_TABLET | Freq: Every day | ORAL | Status: DC
  Administered 2014-08-16 – 2014-08-17 (×2): 1 mg via ORAL
  Filled 2014-08-16 (×2): qty 1

## 2014-08-16 MED ORDER — VITAMIN B-1 100 MG PO TABS
100.0000 mg | ORAL_TABLET | Freq: Every day | ORAL | Status: DC
  Administered 2014-08-17: 100 mg via ORAL
  Filled 2014-08-16: qty 1

## 2014-08-16 MED ORDER — AMOXICILLIN-POT CLAVULANATE 500-125 MG PO TABS: 1.0000 | ORAL_TABLET | Freq: Two times a day (BID) | ORAL | Status: DC

## 2014-08-16 NOTE — Progress Notes (Signed)
Home Care SW made visit to Patient, she was awake, and confused.  She did not talk to SW when spoken to and Aide stated she was confused this morning.  SW did talk to the floor RN who stated she was doing better as far as her sugar and she thinks Patient will be ready for discharge in the next day or so.  SW will continue to assist with discharge planning as needed and will follow up when Patient returns home.  Maretta Los, LCSW [facility] [phone]

## 2014-08-16 NOTE — Progress Notes (Signed)
Speech Language Pathology Treatment: Dysphagia  Patient Details Name: [name] MRN: [id number] DOB: 09-01-1917 Today's Date: 08/16/2014 Time: 8295-6213 SLP Time Calculation (min) (ACUTE ONLY): 19 min  Assessment / Plan / Recommendation Clinical Impression  Pt confused this am during meal, easily distracted and not willing to continue meal after 50% of oatmeal and a few sips of juice. No evidence of aspiration seen with thickened texture, but mastication of soft solids prolonged and laborious with poketed food in buccal cavities. Recommend puree (dys 1) texture until mentation and function improve.    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 Chest x-ray 08-13-14 showed right lower lobe opacity concerning for pneumonia. Opacity was also present in this region March 2016.    Pertinent Vitals Pain Assessment: No/denies pain  SLP Plan  Continue with current plan of care    Recommendations Diet recommendations: Dysphagia 1 (puree);Nectar-thick liquid Liquids provided via: Cup Medication Administration: Whole meds with liquid Supervision: Staff to assist with self feeding Compensations: Small sips/bites;Slow rate Postural Changes and/or Swallow Maneuvers: Seated upright 90 degrees              Oral Care Recommendations: Oral care BID Plan: Continue with current plan of care    GO    [facility], MA CCC-SLP [phone]  Claudine Mouton 08/16/2014, 9:32 AM

## 2014-08-16 NOTE — Discharge Summary (Addendum)
Physician Discharge Summary  [name] POE:[id number] DOB: 1917-09-03 DOA: 08/13/2014  PCP: [name], MD  Admit date: 08/13/2014 Discharge date: 08/16/2014  Time spent: 35 minutes  Recommendations for Outpatient Follow-up:  1. Patient with history of advanced dementia and CVA, admitted for likely aspiration PNA 2. Hospice Services to resume care at home  Discharge Diagnoses:  Principal Problem:   Acute respiratory failure with hypoxemia Active Problems:   Hypertension   Diabetic neuropathy   Dyslipidemia   CKD (chronic kidney disease) stage 3, GFR 30-59 ml/min   HCAP (healthcare-associated pneumonia)   Dysphagia   Aspiration pneumonia   FTT (failure to thrive) in adult   Pulmonary hypertension   Leukocytosis   Chronic diastolic heart failure, NYHA class 1   Ulcer of right heel   Dementia   Sepsis due to pneumonia   Discharge Condition: Stable  Diet recommendation: Heart Healthy  Filed Weights   08/13/14 1810 08/15/14 0611 08/15/14 1043  Weight: 60.827 kg (134 lb 1.6 oz) 61.961 kg (136 lb 9.6 oz) 61.871 kg (136 lb 6.4 oz)    History of present illness:  This is a 81 year old female patient with past medical history diabetes on insulin and oral hypoglycemic agents, hypertension, gout, diabetic neuropathy, chronic kidney disease stage III, as well as 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 Patient was last admitted to this facility in March 2016 with community-acquired pneumonia and sent to a local skilled nursing facility for rehabilitative therapy. Patient was initially set to discharge after about 3 weeks of therapy but fell on expected date of discharge so she remained in the nursing facility until May 30 at which time she went home with her daughter. [name] is at bedside and reports that patient has not returned to pre-March admission baseline regarding her physical activity or mentation. In regards to  her activity level some days she is able to sit up and get to the side of the bed with minimal assistance and other days she is a full assist. She is nonambulatory. Occasionally she does have some trouble choking with her eating but daughter states that thickened liquids which were recommended at time of discharge from this facility were discontinued at the skilled nursing facility. The daughter reports that yesterday the patient ate very well but this morning she had not. She was given her typical dose of insulin and Glucotrol. Her mentation worsened and daughter called EMS. Upon their arrival EMS noted that the patient's CBG was 34. She was given oral glucose supplementation as well as food and tolerated without evidence of choking or coughing. Upon arrival to the ER by the patient's O2 saturation was 80% and patient is not normally chronically hypoxemic.  Hospital Course:  Patient is a pleasant 81 year old female with a past medical history of stage III chronic disease, dementia, hypertension, history of dysphagia who was admitted to the medicine service on 08/12/2014. She was brought to the emergency department by family members who reported failure to thrive/functional decline, as patient has had decreased level of activity, poor by mouth intake, requiring greater assistance. Family members at also expressed concerns with patient choking on her food. On presentation she was found to be hypoglycemic with a blood sugar of 34. She was also found to be in hypoxemic respiratory failure having O2 sats in the 80s. Chest x-ray revealed a right lower lobe opacity concerning for pneumonia. Given history of CVA and dysphagia suspect this likely represents  an aspiration pneumonia.   Probable aspiration pneumonia. -Patient having a history of CVA and dysphagia. She had a modified barium swallow study performed on 07/06/2014 that was negative for aspiration. She was evaluated by speech pathology today as there are  concerns for intermittent aspiration with thin liquids. Speech pathology recommended dysphasia 3 with nectar thickened liquids. -She was treated with IV Zosyn -Repeat chest x-ray showing improvement in right lower lobe infiltrate.  -She was discharged on Augmentin  2. Acute hypoxemic respiratory failure -Evidenced by O2 sats in the 80s on admission, secondary to aspiration pneumonia. -Patient having overall improvement   3. Advanced dementia. -She currently resides at home with her family. -Will monitor closely for delirium -Hospice Services following  4. Hypertension. -Continued on Norvasc 10 mg by mouth daily at discharge   Consultations:  SLP  Discharge Exam: Filed Vitals:   08/16/14 1414  BP: 120/75  Pulse: 83  Temp: 98 F (36.7 C)  Resp: 18     General: Patient is awake and alert, no acute distress, she is confused and disoriented, has no complaints and wants to go home  Cardiovascular: Regular rate and rhythm normal S1-S2 no murmurs rubs or gallops  Respiratory: She has normal respiratory effort, having a few bibasilar crackles with over all improvement to lung sounds.   Abdomen: Soft nontender nondistended  Musculoskeletal: No edema  Discharge Instructions    Current Discharge Medication List    START taking these medications   Details  amoxicillin-clavulanate (AUGMENTIN) 500-125 MG per tablet Take 1 tablet (500 mg total) by mouth every 12 (twelve) hours. Qty: 10 tablet, Refills: 0    Maltodextrin-Xanthan Gum (RESOURCE THICKENUP CLEAR) POWD Take 1 g by mouth as needed (Per diet order.). Qty: 1 Can, Refills: 0      CONTINUE these medications which have NOT CHANGED   Details  acetaminophen (TYLENOL) 325 MG tablet Take 2 tablets (650 mg total) by mouth every 6 (six) hours as needed for mild pain (or Fever >/= 101).    albuterol (PROVENTIL HFA;VENTOLIN HFA) 108 (90 BASE) MCG/ACT inhaler Inhale 2 puffs into the lungs every 6 (six) hours as needed  for wheezing or shortness of breath.    albuterol (PROVENTIL) (2.5 MG/3ML) 0.083% nebulizer solution Take 2.5 mg by nebulization every 6 (six) hours.     allopurinol (ZYLOPRIM) 100 MG tablet Take 100 mg by mouth 2 (two) times daily.    amLODipine (NORVASC) 10 MG tablet Take 1 tablet (10 mg total) by mouth daily. Qty: 30 tablet, Refills: 3    aspirin EC 81 MG EC tablet Take 1 tablet (81 mg total) by mouth daily.    atorvastatin (LIPITOR) 20 MG tablet TAKE 1 TABLET BY MOUTH EVERY DAY Qty: 30 tablet, Refills: 0    cetirizine (ZYRTEC) 10 MG tablet Take 10 mg by mouth daily.    donepezil (ARICEPT) 10 MG tablet Take 10 mg by mouth at bedtime.     furosemide (LASIX) 20 MG tablet Take 20 mg by mouth daily as needed for fluid.     gabapentin (NEURONTIN) 300 MG capsule Take 300 mg by mouth 3 (three) times daily.     insulin aspart (NOVOLOG) 100 UNIT/ML injection Inject 0-1 Units into the skin 3 (three) times daily before meals. Patient uses sliding scale    Multiple Vitamin (MULTIVITAMIN WITH MINERALS) TABS tablet Take 1 tablet by mouth daily.    sertraline (ZOLOFT) 50 MG tablet Take 50 mg by mouth daily.    Wheat Dextrin (BENEFIBER)  CHEW Chew 1 tablet by mouth daily.    insulin lispro protamine-lispro (HUMALOG 75/25 MIX) (75-25) 100 UNIT/ML SUSP injection Inject 10-20 Units into the skin 2 (two) times daily with a meal. 20 units in am and 10 units in pm    risperiDONE (RISPERDAL) 0.5 MG tablet Take 0.5 mg by mouth at bedtime. For hallucinations      STOP taking these medications     doxycycline (VIBRAMYCIN) 100 MG capsule      glipiZIDE (GLUCOTROL) 5 MG tablet        No Known Allergies Follow-up Information    Follow up with AVBUERE,EDWIN A, MD In 1 week.   Specialty:  Internal Medicine   Contact information:   [address] [place] [address] [phone]        The results of significant diagnostics from this hospitalization (including imaging, microbiology,  ancillary and laboratory) are listed below for reference.    Significant Diagnostic Studies: Dg Chest 2 View  08/13/2014   CLINICAL DATA:  Hypoglycemia.  Bradycardia.  EXAM: CHEST  2 VIEW  COMPARISON:  05/18/2014  FINDINGS: There is a right basilar opacity which is asymmetric and concerning for pneumonia. The opacity is more dense than seen by radiography 05/18/2014. Given a prior abnormality was present in this region, will need follow-up to ensure clearance.  Cardiomegaly and aortic tortuosity/prominence which is stable from prior.  IMPRESSION: Right lower lobe opacity concerning for pneumonia. Opacity was also present in this region March 2016, and follow-up is recommended 3-4 weeks after antibiotic trial to ensure clearance.   Electronically Signed   By: Marnee Spring M.D.   On: 08/13/2014 13:48   Dg Chest Port 1 View  08/15/2014   CLINICAL DATA:  Aspiration pneumonia.  EXAM: PORTABLE CHEST - 1 VIEW  COMPARISON:  08/13/2014  FINDINGS: Patient rotated minimally right. Moderate cardiomegaly with a tortuous thoracic aorta. No pleural effusion or pneumothorax. Low lung volumes. This accentuates the pulmonary vascularity. Mild superimposed pulmonary venous congestion cannot be excluded. Improved right base aeration.  IMPRESSION: Improved right base airspace disease.  Cardiomegaly with low lung volumes. Possible concurrent mild pulmonary venous congestion.   Electronically Signed   By: Jeronimo Greaves M.D.   On: 08/15/2014 08:37    Microbiology: Recent Results (from the past 240 hour(s))  Culture, blood (routine x 2)     Status: None (Preliminary result)   Collection Time: 08/13/14  4:50 PM  Result Value Ref Range Status   Specimen Description BLOOD RIGHT HAND  Final   Special Requests BOTTLES DRAWN AEROBIC ONLY 2CC  Final   Culture   Final           BLOOD CULTURE RECEIVED NO GROWTH TO DATE CULTURE WILL BE HELD FOR 5 DAYS BEFORE ISSUING A FINAL NEGATIVE REPORT Performed at [facility]     Report Status PENDING  Incomplete  Culture, blood (routine x 2)     Status: None (Preliminary result)   Collection Time: 08/13/14  4:55 PM  Result Value Ref Range Status   Specimen Description BLOOD ARM LEFT  Final   Special Requests BOTTLES DRAWN AEROBIC AND ANAEROBIC 5CC  Final   Culture   Final           BLOOD CULTURE RECEIVED NO GROWTH TO DATE CULTURE WILL BE HELD FOR 5 DAYS BEFORE ISSUING A FINAL NEGATIVE REPORT Performed at [facility]    Report Status PENDING  Incomplete  MRSA PCR Screening     Status:  None   Collection Time: 08/14/14  4:36 AM  Result Value Ref Range Status   MRSA by PCR NEGATIVE NEGATIVE Final    Comment:        The GeneXpert MRSA Assay (FDA approved for NASAL specimens only), is one component of a comprehensive MRSA colonization surveillance program. It is not intended to diagnose MRSA infection nor to guide or monitor treatment for MRSA infections.      Labs: Basic Metabolic Panel:  Recent Labs Lab 08/13/14 1230 08/14/14 0503  NA 140 143  K 4.2 3.4*  CL 99* 101  CO2 31 33*  GLUCOSE 127* 73  BUN 17 16  CREATININE 1.02* 1.09*  CALCIUM 9.2 8.8*   Liver Function Tests:  Recent Labs Lab 08/14/14 0503  AST 15  ALT 9*  ALKPHOS 62  BILITOT 0.5  PROT 6.4*  ALBUMIN 2.5*   No results for input(s): LIPASE, AMYLASE in the last 168 hours. No results for input(s): AMMONIA in the last 168 hours. CBC:  Recent Labs Lab 08/13/14 1230 08/14/14 0503  WBC 15.4* 10.5  HGB 12.6 10.4*  HCT 39.4 32.7*  MCV 87.6 87.0  PLT 283 209   Cardiac Enzymes: No results for input(s): CKTOTAL, CKMB, CKMBINDEX, TROPONINI in the last 168 hours. BNP: BNP (last 3 results)  Recent Labs  05/18/14 2056 08/13/14 1230  BNP 83.8 158.9*    ProBNP (last 3 results) No results for input(s): PROBNP in the last 8760 hours.  CBG:  Recent Labs Lab 08/15/14 2054 08/16/14 0003 08/16/14 0434 08/16/14 0825 08/16/14 1110  GLUCAP 245* 179* 121* 125*  178*       Signed:  Domingos Riggi  [facility] 08/16/2014, 4:02 PM

## 2014-08-16 NOTE — Progress Notes (Signed)
GIP visit- [name] Tramell-[name] RN  Related covered admission to her hospice diagnosis of Alzheimers, dsyphagia. Pt is a FULL CODE. Patient is alert and appears oriented to person. Patient unable to answer questions appropriately. No family is at the bedside at present. Resps regular and unlabored. No audible wheezing. Spoke with Angelica, staff nurse who reports pt. may go home in the next 1-2 days. Please call [phone] for any hospice needs.   Sharen Heck RN [facility] Liaison [phone]

## 2014-08-17 LAB — GLUCOSE, CAPILLARY
GLUCOSE-CAPILLARY: 134 mg/dL — AB (ref 65–99)
GLUCOSE-CAPILLARY: 183 mg/dL — AB (ref 65–99)
Glucose-Capillary: 149 mg/dL — ABNORMAL HIGH (ref 65–99)

## 2014-08-17 NOTE — Progress Notes (Signed)
Utilization review completed.  

## 2014-08-17 NOTE — Care Management Note (Addendum)
Case Management Note Donn Pierini RN, BSN Unit 2W-Case Manager [phone]  Patient Details  Name: [name] MRN: [id number] Date of Birth: 04/16/1917  Subjective/Objective:  Pt admitted with acute resp. failure                  Action/Plan: PTA pt lived at home- was active with [facility] for home Hospice- plan is to return home with Hospice services- d/c order in for today- have notified [facility] of pt's discharge for today. Spoke with daughter Dois Davenport who states that she plans to transport via car and does not want ambulance transportation assistance.  Expected Discharge Date:      08/17/14            Expected Discharge Plan:  Home w Hospice Care  In-House Referral:     Discharge planning Services  CM Consult  Post Acute Care Choice:    Choice offered to:     DME Arranged:    DME Agency:     HH Arranged:    HH Agency:  [facility]  Status of Service:  Completed, signed off  Medicare Important Message Given:  Yes Date Medicare IM Given:  08/16/14 Medicare IM give by:  Donn Pierini RN, BSN  Date Additional Medicare IM Given:    Additional Medicare Important Message give by:     If discussed at Long Length of Stay Meetings, dates discussed:    Additional Comments:  Darrold Span, RN 08/17/2014, 12:22 PM

## 2014-08-17 NOTE — Progress Notes (Signed)
[facility] PROGRESS NOTE  [name] UJW:[id number] DOB: 04-Sep-1917 DOA: 08/13/2014 PCP: [name], MD  Assessment/Plan: 1. Probable aspiration pneumonia. -Patient having a history of CVA and dysphagia. She had a modified barium swallow study performed on 07/06/2014 that was negative for aspiration. She was evaluated by speech pathology today as there are concerns for intermittent aspiration with thin liquids. Speech pathology recommended dysphasia 3 with nectar thickened liquids. -Discontinued IV vancomycin as I believe this likely reflects an aspiration pneumonia.  -Repeat chest x-ray showing improved right base airspace disease. -She was transition to Augmentin 875 mg by mouth twice a day  2.  Acute hypoxemic respiratory failure -Evidenced by O2 sats in the 80s on admission, secondary to aspiration pneumonia. -Patient showing clinical improvement as chest x-ray demonstrates resolving right basilar airspace disease  3.  Advanced dementia. -She currently resides at home with her family.  4.  Hypertension. -Blood pressure stable, continue Norvasc 10 mg by mouth daily  5.  Goals of Care -Patient currently residing at home with her family, plan to resume home hospice services upon discharge.    Code Status: Full code Family Communication:  Disposition Plan: Plan to d/c in the next 24 hours   Antibiotics:  Zosyn IV discontinued on 08/07/2014  Augmentin  HPI/Subjective: Patient is a pleasant 81 year old female with a past medical history of stage III chronic disease, dementia, hypertension, history of dysphagia who was admitted to the medicine service on 08/12/2014. She was brought to the emergency department by family members who reported failure to thrive/functional decline, as patient has had decreased level of activity, poor by mouth intake, requiring greater assistance. Family members at also expressed concerns with patient choking on her food. On presentation  she was found to be hypoglycemic with a blood sugar of 34. She was also found to be in hypoxemic respiratory failure having O2 sats in the 80s. Chest x-ray revealed a right lower lobe opacity concerning for pneumonia. Given history of CVA and dysphagia suspect this likely represents an aspiration pneumonia.  Objective: Filed Vitals:   08/17/14 1016  BP: 152/57  Pulse:   Temp:   Resp:     Intake/Output Summary (Last 24 hours) at 08/17/14 1210 Last data filed at 08/17/14 1100  Gross per 24 hour  Intake    370 ml  Output      0 ml  Net    370 ml   Filed Weights   08/15/14 0611 08/15/14 1043 08/17/14 0900  Weight: 61.961 kg (136 lb 9.6 oz) 61.871 kg (136 lb 6.4 oz) 61.44 kg (135 lb 7.2 oz)    Exam:   General:  Patient is awake and alert, no acute distress, sitting at bedside chair  Cardiovascular: Regular rate and rhythm normal S1-S2 no murmurs rubs or gallops  Respiratory: She has normal respiratory effort, having a few bibasilar crackles, possibly worse on right base  Abdomen: Soft nontender nondistended  Musculoskeletal: No edema  Data Reviewed: Basic Metabolic Panel:  Recent Labs Lab 08/13/14 1230 08/14/14 0503  NA 140 143  K 4.2 3.4*  CL 99* 101  CO2 31 33*  GLUCOSE 127* 73  BUN 17 16  CREATININE 1.02* 1.09*  CALCIUM 9.2 8.8*   Liver Function Tests:  Recent Labs Lab 08/14/14 0503  AST 15  ALT 9*  ALKPHOS 62  BILITOT 0.5  PROT 6.4*  ALBUMIN 2.5*   No results for input(s): LIPASE, AMYLASE in the last 168 hours. No results for input(s): AMMONIA in the last  168 hours. CBC:  Recent Labs Lab 08/13/14 1230 08/14/14 0503  WBC 15.4* 10.5  HGB 12.6 10.4*  HCT 39.4 32.7*  MCV 87.6 87.0  PLT 283 209   Cardiac Enzymes: No results for input(s): CKTOTAL, CKMB, CKMBINDEX, TROPONINI in the last 168 hours. BNP (last 3 results)  Recent Labs  05/18/14 2056 08/13/14 1230  BNP 83.8 158.9*    ProBNP (last 3 results) No results for input(s): PROBNP  in the last 8760 hours.  CBG:  Recent Labs Lab 08/16/14 1608 08/16/14 2029 08/17/14 0013 08/17/14 0431 08/17/14 1125  GLUCAP 181* 152* 134* 149* 183*    Recent Results (from the past 240 hour(s))  Culture, blood (routine x 2)     Status: None (Preliminary result)   Collection Time: 08/13/14  4:50 PM  Result Value Ref Range Status   Specimen Description BLOOD RIGHT HAND  Final   Special Requests BOTTLES DRAWN AEROBIC ONLY 2CC  Final   Culture   Final           BLOOD CULTURE RECEIVED NO GROWTH TO DATE CULTURE WILL BE HELD FOR 5 DAYS BEFORE ISSUING A FINAL NEGATIVE REPORT Performed at [facility]    Report Status PENDING  Incomplete  Culture, blood (routine x 2)     Status: None (Preliminary result)   Collection Time: 08/13/14  4:55 PM  Result Value Ref Range Status   Specimen Description BLOOD ARM LEFT  Final   Special Requests BOTTLES DRAWN AEROBIC AND ANAEROBIC 5CC  Final   Culture   Final           BLOOD CULTURE RECEIVED NO GROWTH TO DATE CULTURE WILL BE HELD FOR 5 DAYS BEFORE ISSUING A FINAL NEGATIVE REPORT Performed at [facility]    Report Status PENDING  Incomplete  MRSA PCR Screening     Status: None   Collection Time: 08/14/14  4:36 AM  Result Value Ref Range Status   MRSA by PCR NEGATIVE NEGATIVE Final    Comment:        The GeneXpert MRSA Assay (FDA approved for NASAL specimens only), is one component of a comprehensive MRSA colonization surveillance program. It is not intended to diagnose MRSA infection nor to guide or monitor treatment for MRSA infections.      Studies: No results found.  Scheduled Meds: . amLODipine  10 mg Oral Daily  . amoxicillin-clavulanate  1 tablet Oral Q12H  . donepezil  10 mg Oral QHS  . folic acid  1 mg Oral Daily  . heparin  5,000 Units Subcutaneous 3 times per day  . insulin aspart  0-9 Units Subcutaneous 6 times per day  . risperiDONE  0.5 mg Oral QHS  . sertraline  50 mg Oral Daily  . thiamine   100 mg Oral Daily   Continuous Infusions:    Principal Problem:   Acute respiratory failure with hypoxemia Active Problems:   Hypertension   Diabetic neuropathy   Dyslipidemia   CKD (chronic kidney disease) stage 3, GFR 30-59 ml/min   HCAP (healthcare-associated pneumonia)   Dysphagia   Aspiration pneumonia   FTT (failure to thrive) in adult   Pulmonary hypertension   Leukocytosis   Chronic diastolic heart failure, NYHA class 1   Ulcer of right heel   Dementia   Sepsis due to pneumonia    Time spent: 20 minutes    Jeralyn Bennett  [facility] Pager [phone]. If 7PM-7AM, please contact night-coverage at [email or web address], password [facility] 08/17/2014, 12:10  PM  LOS: 4 days

## 2014-08-17 NOTE — Progress Notes (Signed)
GIP visit- Miana Hogue-[name] RN  Related covered admission to her hospice diagnosis of Alzheimers, dsyphagia. Pt is a FULL CODE. Patient is alert and appears oriented to person. Patient sitting up in th chair and asking for help to go to the bathroom. Hospital aide notified. [name] arrived and reports she is there to take patient home. Dtr. advised [facility] primary RN will be out tomorrow to f/u with pt. at home. Pt. Denies any pain.    Please call [phone] for any hospice needs.   Sharen Heck RN [facility] Liaison [phone]

## 2014-08-19 ENCOUNTER — Other Ambulatory Visit: Payer: Self-pay | Admitting: Internal Medicine

## 2014-08-20 LAB — CULTURE, BLOOD (ROUTINE X 2)
CULTURE: NO GROWTH
Culture: NO GROWTH

## 2014-09-12 ENCOUNTER — Other Ambulatory Visit: Payer: Self-pay | Admitting: Internal Medicine

## 2014-10-13 ENCOUNTER — Emergency Department (HOSPITAL_COMMUNITY)

## 2014-10-13 ENCOUNTER — Encounter (HOSPITAL_COMMUNITY): Payer: Self-pay | Admitting: *Deleted

## 2014-10-13 ENCOUNTER — Inpatient Hospital Stay (HOSPITAL_COMMUNITY)
Admission: EM | Admit: 2014-10-13 | Discharge: 2014-10-18 | DRG: 643 | Disposition: A | Discharge status: Hospice | Attending: Internal Medicine | Admitting: Internal Medicine

## 2014-10-13 DIAGNOSIS — I639 Cerebral infarction, unspecified: Secondary | ICD-10-CM | POA: Diagnosis present

## 2014-10-13 DIAGNOSIS — E1122 Type 2 diabetes mellitus with diabetic chronic kidney disease: Secondary | ICD-10-CM | POA: Diagnosis present

## 2014-10-13 DIAGNOSIS — I739 Peripheral vascular disease, unspecified: Secondary | ICD-10-CM | POA: Diagnosis present

## 2014-10-13 DIAGNOSIS — E860 Dehydration: Secondary | ICD-10-CM | POA: Diagnosis present

## 2014-10-13 DIAGNOSIS — Z794 Long term (current) use of insulin: Secondary | ICD-10-CM

## 2014-10-13 DIAGNOSIS — I9589 Other hypotension: Secondary | ICD-10-CM | POA: Diagnosis present

## 2014-10-13 DIAGNOSIS — F0390 Unspecified dementia without behavioral disturbance: Secondary | ICD-10-CM | POA: Diagnosis present

## 2014-10-13 DIAGNOSIS — K219 Gastro-esophageal reflux disease without esophagitis: Secondary | ICD-10-CM | POA: Diagnosis present

## 2014-10-13 DIAGNOSIS — R1310 Dysphagia, unspecified: Secondary | ICD-10-CM | POA: Diagnosis present

## 2014-10-13 DIAGNOSIS — T383X5A Adverse effect of insulin and oral hypoglycemic [antidiabetic] drugs, initial encounter: Secondary | ICD-10-CM | POA: Diagnosis present

## 2014-10-13 DIAGNOSIS — E785 Hyperlipidemia, unspecified: Secondary | ICD-10-CM | POA: Diagnosis present

## 2014-10-13 DIAGNOSIS — I959 Hypotension, unspecified: Secondary | ICD-10-CM | POA: Diagnosis present

## 2014-10-13 DIAGNOSIS — E160 Drug-induced hypoglycemia without coma: Principal | ICD-10-CM | POA: Diagnosis present

## 2014-10-13 DIAGNOSIS — Z791 Long term (current) use of non-steroidal anti-inflammatories (NSAID): Secondary | ICD-10-CM | POA: Diagnosis not present

## 2014-10-13 DIAGNOSIS — A419 Sepsis, unspecified organism: Secondary | ICD-10-CM | POA: Diagnosis present

## 2014-10-13 DIAGNOSIS — E1165 Type 2 diabetes mellitus with hyperglycemia: Secondary | ICD-10-CM | POA: Diagnosis not present

## 2014-10-13 DIAGNOSIS — R627 Adult failure to thrive: Secondary | ICD-10-CM | POA: Diagnosis present

## 2014-10-13 DIAGNOSIS — M109 Gout, unspecified: Secondary | ICD-10-CM | POA: Diagnosis present

## 2014-10-13 DIAGNOSIS — I69398 Other sequelae of cerebral infarction: Secondary | ICD-10-CM

## 2014-10-13 DIAGNOSIS — Z833 Family history of diabetes mellitus: Secondary | ICD-10-CM | POA: Diagnosis not present

## 2014-10-13 DIAGNOSIS — J189 Pneumonia, unspecified organism: Secondary | ICD-10-CM | POA: Diagnosis present

## 2014-10-13 DIAGNOSIS — IMO0002 Reserved for concepts with insufficient information to code with codable children: Secondary | ICD-10-CM | POA: Diagnosis present

## 2014-10-13 DIAGNOSIS — I69354 Hemiplegia and hemiparesis following cerebral infarction affecting left non-dominant side: Secondary | ICD-10-CM

## 2014-10-13 DIAGNOSIS — J690 Pneumonitis due to inhalation of food and vomit: Secondary | ICD-10-CM | POA: Diagnosis present

## 2014-10-13 DIAGNOSIS — N183 Chronic kidney disease, stage 3 unspecified: Secondary | ICD-10-CM | POA: Diagnosis present

## 2014-10-13 DIAGNOSIS — R680 Hypothermia, not associated with low environmental temperature: Secondary | ICD-10-CM | POA: Diagnosis present

## 2014-10-13 DIAGNOSIS — T68XXXA Hypothermia, initial encounter: Secondary | ICD-10-CM | POA: Diagnosis not present

## 2014-10-13 DIAGNOSIS — I5032 Chronic diastolic (congestive) heart failure: Secondary | ICD-10-CM | POA: Diagnosis present

## 2014-10-13 DIAGNOSIS — E870 Hyperosmolality and hypernatremia: Secondary | ICD-10-CM | POA: Diagnosis present

## 2014-10-13 DIAGNOSIS — R2990 Unspecified symptoms and signs involving the nervous system: Secondary | ICD-10-CM

## 2014-10-13 DIAGNOSIS — IMO0001 Reserved for inherently not codable concepts without codable children: Secondary | ICD-10-CM | POA: Diagnosis present

## 2014-10-13 DIAGNOSIS — I4581 Long QT syndrome: Secondary | ICD-10-CM | POA: Diagnosis present

## 2014-10-13 DIAGNOSIS — I69359 Hemiplegia and hemiparesis following cerebral infarction affecting unspecified side: Secondary | ICD-10-CM | POA: Diagnosis present

## 2014-10-13 DIAGNOSIS — I272 Other secondary pulmonary hypertension: Secondary | ICD-10-CM | POA: Diagnosis present

## 2014-10-13 DIAGNOSIS — Z79899 Other long term (current) drug therapy: Secondary | ICD-10-CM

## 2014-10-13 DIAGNOSIS — Z7982 Long term (current) use of aspirin: Secondary | ICD-10-CM | POA: Diagnosis not present

## 2014-10-13 DIAGNOSIS — I129 Hypertensive chronic kidney disease with stage 1 through stage 4 chronic kidney disease, or unspecified chronic kidney disease: Secondary | ICD-10-CM | POA: Diagnosis present

## 2014-10-13 DIAGNOSIS — I517 Cardiomegaly: Secondary | ICD-10-CM | POA: Diagnosis present

## 2014-10-13 DIAGNOSIS — R9431 Abnormal electrocardiogram [ECG] [EKG]: Secondary | ICD-10-CM | POA: Diagnosis present

## 2014-10-13 DIAGNOSIS — I10 Essential (primary) hypertension: Secondary | ICD-10-CM | POA: Diagnosis present

## 2014-10-13 LAB — COMPREHENSIVE METABOLIC PANEL
ALK PHOS: 61 U/L (ref 38–126)
ALT: 9 U/L — ABNORMAL LOW (ref 14–54)
AST: 22 U/L (ref 15–41)
Albumin: 2.5 g/dL — ABNORMAL LOW (ref 3.5–5.0)
Anion gap: 8 (ref 5–15)
BILIRUBIN TOTAL: 0.4 mg/dL (ref 0.3–1.2)
BUN: 24 mg/dL — ABNORMAL HIGH (ref 6–20)
CALCIUM: 8.6 mg/dL — AB (ref 8.9–10.3)
CHLORIDE: 107 mmol/L (ref 101–111)
CO2: 31 mmol/L (ref 22–32)
Creatinine, Ser: 1.18 mg/dL — ABNORMAL HIGH (ref 0.44–1.00)
GFR, EST AFRICAN AMERICAN: 44 mL/min — AB (ref >60)
GFR, EST NON AFRICAN AMERICAN: 38 mL/min — AB (ref >60)
GLUCOSE: 102 mg/dL — AB (ref 65–99)
Potassium: 3.9 mmol/L (ref 3.5–5.1)
Sodium: 146 mmol/L — ABNORMAL HIGH (ref 135–145)
Total Protein: 6.7 g/dL (ref 6.5–8.1)

## 2014-10-13 LAB — URINALYSIS, ROUTINE W REFLEX MICROSCOPIC
Bilirubin Urine: NEGATIVE
GLUCOSE, UA: NEGATIVE mg/dL
Ketones, ur: NEGATIVE mg/dL
LEUKOCYTES UA: NEGATIVE
Nitrite: NEGATIVE
PROTEIN: NEGATIVE mg/dL
SPECIFIC GRAVITY, URINE: 1.017 (ref 1.005–1.030)
Urobilinogen, UA: 0.2 mg/dL (ref 0.0–1.0)
pH: 7.5 (ref 5.0–8.0)

## 2014-10-13 LAB — I-STAT ARTERIAL BLOOD GAS, ED
Acid-Base Excess: 8 mmol/L — ABNORMAL HIGH (ref 0.0–2.0)
Bicarbonate: 35.8 mEq/L — ABNORMAL HIGH (ref 20.0–24.0)
O2 Saturation: 98 %
TCO2: 38 mmol/L (ref 0–100)
pCO2 arterial: 68.4 mmHg (ref 35.0–45.0)
pH, Arterial: 7.327 — ABNORMAL LOW (ref 7.350–7.450)
pO2, Arterial: 121 mmHg — ABNORMAL HIGH (ref 80.0–100.0)

## 2014-10-13 LAB — CBG MONITORING, ED
GLUCOSE-CAPILLARY: 119 mg/dL — AB (ref 65–99)
Glucose-Capillary: 95 mg/dL (ref 65–99)
Glucose-Capillary: 95 mg/dL (ref 65–99)

## 2014-10-13 LAB — CBC WITH DIFFERENTIAL/PLATELET
BASOS ABS: 0 10*3/uL (ref 0.0–0.1)
BASOS PCT: 0 % (ref 0–1)
EOS ABS: 0.1 10*3/uL (ref 0.0–0.7)
EOS PCT: 1 % (ref 0–5)
HCT: 39.5 % (ref 36.0–46.0)
Hemoglobin: 12 g/dL (ref 12.0–15.0)
Lymphocytes Relative: 17 % (ref 12–46)
Lymphs Abs: 1.1 10*3/uL (ref 0.7–4.0)
MCH: 28.1 pg (ref 26.0–34.0)
MCHC: 30.4 g/dL (ref 30.0–36.0)
MCV: 92.5 fL (ref 78.0–100.0)
Monocytes Absolute: 0.4 10*3/uL (ref 0.1–1.0)
Monocytes Relative: 6 % (ref 3–12)
NEUTROS PCT: 76 % (ref 43–77)
Neutro Abs: 5 10*3/uL (ref 1.7–7.7)
PLATELETS: 214 10*3/uL (ref 150–400)
RBC: 4.27 MIL/uL (ref 3.87–5.11)
RDW: 17.1 % — AB (ref 11.5–15.5)
WBC: 6.5 10*3/uL (ref 4.0–10.5)

## 2014-10-13 LAB — URINE MICROSCOPIC-ADD ON

## 2014-10-13 LAB — MAGNESIUM: MAGNESIUM: 2.2 mg/dL (ref 1.7–2.4)

## 2014-10-13 LAB — GLUCOSE, CAPILLARY
Glucose-Capillary: 53 mg/dL — ABNORMAL LOW (ref 65–99)
Glucose-Capillary: 146 mg/dL — ABNORMAL HIGH (ref 65–99)

## 2014-10-13 LAB — I-STAT CG4 LACTIC ACID, ED
Lactic Acid, Venous: 0.36 mmol/L — ABNORMAL LOW (ref 0.5–2.0)
Lactic Acid, Venous: 1.73 mmol/L (ref 0.5–2.0)

## 2014-10-13 LAB — MRSA PCR SCREENING: MRSA BY PCR: NEGATIVE

## 2014-10-13 MED ORDER — PSYLLIUM 95 % PO PACK
1.0000 | PACK | Freq: Every day | ORAL | Status: DC
  Administered 2014-10-14 – 2014-10-18 (×5): 1 via ORAL
  Filled 2014-10-13 (×5): qty 1

## 2014-10-13 MED ORDER — ACETAMINOPHEN 650 MG RE SUPP
650.0000 mg | Freq: Four times a day (QID) | RECTAL | Status: DC | PRN
Start: 2014-10-13 — End: 2014-10-18

## 2014-10-13 MED ORDER — SODIUM CHLORIDE 0.9 % IJ SOLN
3.0000 mL | Freq: Two times a day (BID) | INTRAMUSCULAR | Status: DC
  Administered 2014-10-13 – 2014-10-18 (×8): 3 mL via INTRAVENOUS

## 2014-10-13 MED ORDER — VANCOMYCIN HCL IN DEXTROSE 1-5 GM/200ML-% IV SOLN
1000.0000 mg | INTRAVENOUS | Status: DC
  Administered 2014-10-14 – 2014-10-16 (×3): 1000 mg via INTRAVENOUS
  Filled 2014-10-13 (×4): qty 200

## 2014-10-13 MED ORDER — PIPERACILLIN-TAZOBACTAM 3.375 G IVPB
3.3750 g | Freq: Three times a day (TID) | INTRAVENOUS | Status: DC
  Administered 2014-10-13 – 2014-10-17 (×11): 3.375 g via INTRAVENOUS
  Filled 2014-10-13 (×14): qty 50

## 2014-10-13 MED ORDER — SODIUM CHLORIDE 0.9 % IV BOLUS (SEPSIS)
1000.0000 mL | INTRAVENOUS | Status: AC
  Administered 2014-10-13 (×2): 1000 mL via INTRAVENOUS

## 2014-10-13 MED ORDER — LORATADINE 10 MG PO TABS
10.0000 mg | ORAL_TABLET | Freq: Every day | ORAL | Status: DC
  Administered 2014-10-14 – 2014-10-18 (×5): 10 mg via ORAL
  Filled 2014-10-13 (×7): qty 1

## 2014-10-13 MED ORDER — INSULIN ASPART 100 UNIT/ML ~~LOC~~ SOLN
0.0000 [IU] | Freq: Three times a day (TID) | SUBCUTANEOUS | Status: DC
  Administered 2014-10-14: 2 [IU] via SUBCUTANEOUS
  Administered 2014-10-15: 1 [IU] via SUBCUTANEOUS
  Administered 2014-10-15: 2 [IU] via SUBCUTANEOUS
  Administered 2014-10-15: 1 [IU] via SUBCUTANEOUS
  Administered 2014-10-16 (×3): 2 [IU] via SUBCUTANEOUS
  Administered 2014-10-17: 0 [IU] via SUBCUTANEOUS
  Administered 2014-10-17: 2 [IU] via SUBCUTANEOUS
  Administered 2014-10-17 – 2014-10-18 (×2): 0 [IU] via SUBCUTANEOUS

## 2014-10-13 MED ORDER — RISPERIDONE 0.25 MG PO TABS
0.2500 mg | ORAL_TABLET | Freq: Every day | ORAL | Status: DC
  Administered 2014-10-13 – 2014-10-17 (×5): 0.25 mg via ORAL
  Filled 2014-10-13 (×6): qty 1

## 2014-10-13 MED ORDER — ASPIRIN EC 81 MG PO TBEC
81.0000 mg | DELAYED_RELEASE_TABLET | Freq: Every day | ORAL | Status: DC
  Administered 2014-10-14 – 2014-10-18 (×5): 81 mg via ORAL
  Filled 2014-10-13 (×7): qty 1

## 2014-10-13 MED ORDER — ALBUTEROL SULFATE (2.5 MG/3ML) 0.083% IN NEBU: 2.5000 mg | INHALATION_SOLUTION | RESPIRATORY_TRACT | Status: DC | PRN

## 2014-10-13 MED ORDER — ENOXAPARIN SODIUM 30 MG/0.3ML ~~LOC~~ SOLN
30.0000 mg | SUBCUTANEOUS | Status: DC
  Administered 2014-10-13 – 2014-10-17 (×4): 30 mg via SUBCUTANEOUS
  Filled 2014-10-13 (×7): qty 0.3

## 2014-10-13 MED ORDER — PIPERACILLIN-TAZOBACTAM 3.375 G IVPB 30 MIN
3.3750 g | Freq: Once | INTRAVENOUS | Status: AC
  Administered 2014-10-13: 3.375 g via INTRAVENOUS
  Filled 2014-10-13: qty 50

## 2014-10-13 MED ORDER — DEXTROSE 50 % IV SOLN
INTRAVENOUS | Status: AC
  Filled 2014-10-13: qty 50

## 2014-10-13 MED ORDER — ALLOPURINOL 100 MG PO TABS
100.0000 mg | ORAL_TABLET | Freq: Two times a day (BID) | ORAL | Status: DC
  Administered 2014-10-14 – 2014-10-18 (×9): 100 mg via ORAL
  Filled 2014-10-13 (×12): qty 1

## 2014-10-13 MED ORDER — BENEFIBER PO CHEW: 1.0000 | CHEWABLE_TABLET | Freq: Every day | ORAL | Status: DC

## 2014-10-13 MED ORDER — ACETAMINOPHEN 325 MG PO TABS
650.0000 mg | ORAL_TABLET | Freq: Four times a day (QID) | ORAL | Status: DC | PRN
Start: 2014-10-13 — End: 2014-10-18

## 2014-10-13 MED ORDER — GABAPENTIN 300 MG PO CAPS
300.0000 mg | ORAL_CAPSULE | Freq: Three times a day (TID) | ORAL | Status: DC
  Administered 2014-10-13 – 2014-10-18 (×14): 300 mg via ORAL
  Filled 2014-10-13 (×18): qty 1

## 2014-10-13 MED ORDER — VANCOMYCIN HCL IN DEXTROSE 1-5 GM/200ML-% IV SOLN
1000.0000 mg | Freq: Once | INTRAVENOUS | Status: AC
  Administered 2014-10-13: 1000 mg via INTRAVENOUS
  Filled 2014-10-13: qty 200

## 2014-10-13 MED ORDER — ATORVASTATIN CALCIUM 20 MG PO TABS
20.0000 mg | ORAL_TABLET | Freq: Every day | ORAL | Status: DC
  Administered 2014-10-14 – 2014-10-18 (×5): 20 mg via ORAL
  Filled 2014-10-13 (×7): qty 1

## 2014-10-13 MED ORDER — SODIUM CHLORIDE 0.45 % IV SOLN
INTRAVENOUS | Status: AC
Start: 2014-10-13 — End: 2014-10-14
  Administered 2014-10-13: 20:00:00 via INTRAVENOUS

## 2014-10-13 MED ORDER — DEXTROSE 50 % IV SOLN
1.0000 | Freq: Once | INTRAVENOUS | Status: AC
  Administered 2014-10-13: 50 mL via INTRAVENOUS

## 2014-10-13 MED ORDER — DONEPEZIL HCL 10 MG PO TABS
10.0000 mg | ORAL_TABLET | Freq: Every day | ORAL | Status: DC
  Administered 2014-10-13 – 2014-10-17 (×5): 10 mg via ORAL
  Filled 2014-10-13 (×6): qty 1

## 2014-10-13 MED ORDER — SERTRALINE HCL 50 MG PO TABS
50.0000 mg | ORAL_TABLET | Freq: Every day | ORAL | Status: DC
  Administered 2014-10-14 – 2014-10-18 (×5): 50 mg via ORAL
  Filled 2014-10-13 (×7): qty 1

## 2014-10-13 NOTE — Progress Notes (Signed)
ANTIBIOTIC CONSULT NOTE - INITIAL  Pharmacy Consult for Vancomycin and Zosyn Indication: rule out sepsis  No Known Allergies  Patient Measurements:   Adjusted Body Weight:   Vital Signs: Temp: 95 F (35 C) (08/10 1525) Temp Source: Rectal (08/10 1525) BP: 156/72 mmHg (08/10 1520) Pulse Rate: 63 (08/10 1520) Intake/Output from previous day:   Intake/Output from this shift:    Labs:  Recent Labs  10/13/14 1438  WBC 6.5  HGB 12.0  PLT 214  CREATININE 1.18*   CrCl cannot be calculated (Unknown ideal weight.). No results for input(s): VANCOTROUGH, VANCOPEAK, VANCORANDOM, GENTTROUGH, GENTPEAK, GENTRANDOM, TOBRATROUGH, TOBRAPEAK, TOBRARND, AMIKACINPEAK, AMIKACINTROU, AMIKACIN in the last 72 hours.   Microbiology: Recent Results (from the past 720 hour(s))  Blood Culture (routine x 2)     Status: None (Preliminary result)   Collection Time: 10/13/14  2:00 PM  Result Value Ref Range Status   Specimen Description BLOOD LEFT ANTECUBITAL  Final   Special Requests BOTTLES DRAWN AEROBIC ONLY  Final   Culture PENDING  Incomplete   Report Status PENDING  Incomplete  Blood Culture (routine x 2)     Status: None (Preliminary result)   Collection Time: 10/13/14  2:15 PM  Result Value Ref Range Status   Specimen Description BLOOD RIGHT HAND  Final   Special Requests BOTTLES DRAWN AEROBIC ONLY  Final   Culture PENDING  Incomplete   Report Status PENDING  Incomplete    Medical History: Past Medical History  Diagnosis Date  . UTI (urinary tract infection)   . Hypertension   . Diabetes mellitus   . Gout   . High cholesterol   . Complication of anesthesia   . Diabetic neuropathy   . PVD (peripheral vascular disease)   . Carotid artery occlusion   . Arthritis   . Chronic kidney disease   . GERD (gastroesophageal reflux disease)   . Vertigo   . Venous insufficiency   . Mood disorder   . Dysphagia   . Hemiparesis and alteration of sensations as late effects of  stroke 12/09/2013    Medications:   (Not in a hospital admission) Scheduled:  Infusions:   Assessment: 81yo female with history of HTN, DM2, PVD, CKD, and dysphagia presents with AMS. Pharmacy is consulted to dose vancomycin and zosyn for suspected sepsis. Pt is hypothermic at 95, WBC 6.5, sCr 1.18.  Pt received vancomycin 1g and zosyn 3.375g IV once in the ED.  Goal of Therapy:  Vancomycin trough level 15-20 mcg/ml  Plan: Vancomycin 1g IV q24h Zosyn 3.375g IV q8h  Measure antibiotic drug levels at steady state Follow up culture results, renal function and clinical course  Arlean Hopping. Newman Pies, PharmD Clinical Pharmacist Pager [phone] 10/13/2014,4:03 PM

## 2014-10-13 NOTE — ED Notes (Signed)
Pt transported to CT. This RN accompanied pt. 

## 2014-10-13 NOTE — H&P (Signed)
History and Physical  [name] ZOX:[id number] DOB: [birth year] DOA: 10/13/2014  Referring physician: Roxy Horseman, ED PA-C PCP: [name], MD  Outpatient Specialists:  1. Not known  Chief Complaint: Hypoglycemia  HPI: [name] is a 81 y.o. female , widowed, lives with daughter with 24/7 supervision/assistance, nonambulatory for the last 8 months at least and wheelchair mobile, PMH of DM 2/IDDM, HTN, HLD, gout, CVA with residual left hemiparesis, dysphagia, chronic kidney disease presented to [facility] ED on 10/13/14 with an episode of hypoglycemia and altered mental status. Patient unable to provide significant history secondary to altered mental status and possible dementia. She is alert and oriented partly to place and states that "I am fine" and denies complaints at this time. As per patient's daughter at bedside, patient was in her usual state of health until this morning. Fasting blood sugar was 103 mg per DL. Due to 3 visitors coming home this morning, family could not provide patient her morning breakfast. She however got her 70/25 insulin of 20 units at approximately 8 AM. At approximately 11 AM, daughter noticed that patient was cold and clammy and checked her CBG which was 30 mg per DL. Patient was sitting on chair and daughter started to feed her but she wouldn't take anything in. She attempted to give her some orange juice but patient was unable to swallow. She was drowsy but arousable and as per daughter, did not demonstrate any new stroke signs. As per EMS, CBG was 28 and found.reporting OJ in patient's mouth while patient still had food in the mouth. She was resuscitated with IV D 50 followed by IV D10. On arrival to the ED, patient was noted to be hypothermic, hypotensive. Lab work significant for sodium 146, CT head without acute abnormality and chest x-ray without acute abnormality. Sepsis protocol was initiated and patient was bolused with IV fluids and started on  broad-spectrum IV antibiotics with the suspicion of aspiration pneumonitis/pneumonia. As per daughter, patient has nearly returned to her baseline. Hospitalist admission was requested.   Review of Systems: All systems reviewed and apart from history of presenting illness, are negative.  Past Medical History  Diagnosis Date  . UTI (urinary tract infection)   . Hypertension   . Diabetes mellitus   . Gout   . High cholesterol   . Complication of anesthesia   . Diabetic neuropathy   . PVD (peripheral vascular disease)   . Carotid artery occlusion   . Arthritis   . Chronic kidney disease   . GERD (gastroesophageal reflux disease)   . Vertigo   . Venous insufficiency   . Mood disorder   . Dysphagia   . Hemiparesis and alteration of sensations as late effects of stroke 12/09/2013   Past Surgical History  Procedure Laterality Date  . Hemorrhoid banding    . Abdominal hysterectomy    . Cardiac catheterization    . Lower extremity angiogram N/A 05/26/2013    Procedure: LOWER EXTREMITY ANGIOGRAM;  Surgeon: Nada Libman, MD;  Location: [facility] CATH LAB;  Service: Cardiovascular;  Laterality: N/A;   Social History:  reports that she has never smoked. She has never used smokeless tobacco. She reports that she does not drink alcohol or use illicit drugs. Widowed. Lives with daughter. Non-ambulance/wheelchair mobile.  No Known Allergies  Family History  Problem Relation Age of Onset  . Heart disease Daughter   . Hypertension Daughter   . Diabetes Son   . Heart disease Son   .  Diabetes Father   . Diabetes Sister   . Diabetes Sister     Prior to Admission medications   Medication Sig Start Date End Date Taking? Authorizing Provider  acetaminophen (TYLENOL) 325 MG tablet Take 2 tablets (650 mg total) by mouth every 6 (six) hours as needed for mild pain (or Fever >/= 101). 05/21/14  Yes Christiane Ha, MD  albuterol (PROVENTIL HFA;VENTOLIN HFA) 108 (90 BASE) MCG/ACT inhaler Inhale 2  puffs into the lungs every 6 (six) hours as needed for wheezing or shortness of breath.   Yes Historical Provider, MD  albuterol (PROVENTIL) (2.5 MG/3ML) 0.083% nebulizer solution Take 2.5 mg by nebulization every 6 (six) hours.    Yes Historical Provider, MD  allopurinol (ZYLOPRIM) 100 MG tablet Take 100 mg by mouth 2 (two) times daily.   Yes Historical Provider, MD  amLODipine (NORVASC) 10 MG tablet Take 1 tablet (10 mg total) by mouth daily. 11/26/11  Yes Catarina Hartshorn, MD  aspirin EC 81 MG EC tablet Take 1 tablet (81 mg total) by mouth daily. 11/26/11  Yes Catarina Hartshorn, MD  atorvastatin (LIPITOR) 20 MG tablet TAKE 1 TABLET BY MOUTH EVERY DAY 01/18/14  Yes Margit Hanks, MD  cetirizine (ZYRTEC) 10 MG tablet Take 10 mg by mouth daily.   Yes Historical Provider, MD  donepezil (ARICEPT) 10 MG tablet Take 10 mg by mouth at bedtime.    Yes Historical Provider, MD  furosemide (LASIX) 20 MG tablet Take 20 mg by mouth daily as needed for fluid.    Yes Historical Provider, MD  gabapentin (NEURONTIN) 300 MG capsule Take 300 mg by mouth 3 (three) times daily.    Yes Historical Provider, MD  insulin aspart (NOVOLOG) 100 UNIT/ML injection Inject 0-1 Units into the skin 3 (three) times daily before meals. Patient uses sliding scale   Yes Historical Provider, MD  insulin lispro protamine-lispro (HUMALOG 75/25 MIX) (75-25) 100 UNIT/ML SUSP injection Inject 10-20 Units into the skin 2 (two) times daily with a meal. 20 units in am and 10 units in pm   Yes Historical Provider, MD  Maltodextrin-Xanthan Gum (RESOURCE THICKENUP CLEAR) POWD Take 1 g by mouth as needed (Per diet order.). 08/16/14  Yes Jeralyn Bennett, MD  risperiDONE (RISPERDAL) 0.5 MG tablet Take 0.5 mg by mouth at bedtime. For hallucinations   Yes Historical Provider, MD  sertraline (ZOLOFT) 50 MG tablet Take 50 mg by mouth daily.   Yes Historical Provider, MD  Wheat Dextrin (BENEFIBER) CHEW Chew 1 tablet by mouth daily.   Yes Historical Provider, MD    amoxicillin-clavulanate (AUGMENTIN) 500-125 MG per tablet Take 1 tablet (500 mg total) by mouth every 12 (twelve) hours. 08/16/14   Jeralyn Bennett, MD  Multiple Vitamin (MULTIVITAMIN WITH MINERALS) TABS tablet Take 1 tablet by mouth daily. 05/21/14   Christiane Ha, MD   Physical Exam: Filed Vitals:   10/13/14 1620 10/13/14 1645 10/13/14 1700 10/13/14 1715  BP: 118/63 101/54 99/59 91/50   Pulse: 59 56 57 53  Temp:  95.4 F (35.2 C)    TempSrc:  Rectal    Resp: SpO2: 96% 98% 94% 95%     General exam: Moderately built and nourished pleasant elderly female patient, chronically ill-looking, lying comfortably supine on the gurney in no obvious distress.  Head, eyes and ENT: Nontraumatic and normocephalic. Pupils equally reacting to light and accommodation. Oral mucosa slightly dry. Edentulous.  Neck: Supple. No JVD, carotid bruit or thyromegaly.  Lymphatics: No  lymphadenopathy.  Respiratory system: Occasional basal crackles but otherwise clear to auscultation. No increased work of breathing.  Cardiovascular system: S1 and S2 heard, RRR. No JVD, murmurs, gallops, clicks or pedal edema.  Gastrointestinal system: Abdomen is nondistended, soft and nontender. Normal bowel sounds heard. No organomegaly or masses appreciated.  Central nervous system: Alert and oriented to self and partly to place. No new focal neurological deficits.  Extremities: 5 x 5 power in right extremities, 4 x 5 power in left lower extremity and grade 3 x 5 power at least in left upper extremity. Peripheral pulses symmetrically felt.   Skin: No rashes or acute findings.  Musculoskeletal system: Negative exam.  Psychiatry: Pleasant and cooperative.   Labs on Admission:  Basic Metabolic Panel:  Recent Labs Lab 10/13/14 1438  NA 146*  K 3.9  CL 107  CO2 31  GLUCOSE 102*  BUN 24*  CREATININE 1.18*  CALCIUM 8.6*   Liver Function Tests:  Recent Labs Lab 10/13/14 1438  AST 22  ALT 9*   ALKPHOS 61  BILITOT 0.4  PROT 6.7  ALBUMIN 2.5*   No results for input(s): LIPASE, AMYLASE in the last 168 hours. No results for input(s): AMMONIA in the last 168 hours. CBC:  Recent Labs Lab 10/13/14 1438  WBC 6.5  NEUTROABS 5.0  HGB 12.0  HCT 39.5  MCV 92.5  PLT 214   Cardiac Enzymes: No results for input(s): CKTOTAL, CKMB, CKMBINDEX, TROPONINI in the last 168 hours.  BNP (last 3 results) No results for input(s): PROBNP in the last 8760 hours. CBG:  Recent Labs Lab 10/13/14 1326 10/13/14 1519 10/13/14 1651  GLUCAP 95 119* 95    Radiological Exams on Admission: Ct Head Wo Contrast  10/13/2014   CLINICAL DATA:  Patient would not swallow while eating. Drooling. Altered mental status.  EXAM: CT HEAD WITHOUT CONTRAST  TECHNIQUE: Contiguous axial images were obtained from the base of the skull through the vertex without intravenous contrast.  COMPARISON:  06/27/2014  FINDINGS: There is no evidence of mass effect, midline shift, or extra-axial fluid collections. There is no evidence of a space-occupying lesion or intracranial hemorrhage. There is no evidence of a cortical-based area of acute infarction. There is generalized cerebral atrophy. There is periventricular white matter low attenuation likely secondary to microangiopathy. There is an old left cerebellar infarct.  The ventricles and sulci are appropriate for the patient's age. The basal cisterns are patent.  Visualized portions of the orbits are unremarkable. Bilateral maxillary sinus opacification with high density material within the maxillary sinuses likely reflecting inspissated mucus. Cerebrovascular atherosclerotic calcifications are noted.  The osseous structures are unremarkable.  IMPRESSION: 1.   1.  No acute intracranial pathology. 2. Chronic microvascular disease and cerebral atrophy. 2.   Electronically Signed   By: Elige Ko   On: 10/13/2014 15:02   Dg Chest Port 1 View  10/13/2014   CLINICAL DATA:  Rule  out aspiration.  EXAM: PORTABLE CHEST - 1 VIEW  COMPARISON:  08/15/2014  FINDINGS: Heart size is enlarged. There is aortic atherosclerosis noted. No pleural effusion or edema. No airspace consolidation.  IMPRESSION: No evidence for aspiration.   Electronically Signed   By: Signa Kell M.D.   On: 10/13/2014 14:14    EKG: Independently reviewed. Sinus bradycardia at 54 bpm, LAD, Q waves in inferior leads and leads V2-V5 and no acute changes. QTC 496 ms.  Assessment/Plan Active Problems:   Hypertension   Dyslipidemia   CKD (chronic kidney disease)  stage 3, GFR 30-59 ml/min   Hemiparesis and alteration of sensations as late effects of stroke   Dysphagia   FTT (failure to thrive) in adult   Chronic diastolic heart failure, NYHA class 1   Dementia   Sepsis   Hypoglycemia due to insulin   Hypothermia   1. Type II DM/IDDM with hypoglycemia: Most likely secondary to insulin in the absence of a.m. breakfast. Resuscitated with IV D50 followed by D10 by EMS on Route to EGD. Hypoglycemia currently resolved. Hold 70/25 insulin. Monitor CBGs closely and for now treat hyperglycemia with NovoLog sensitive SSI. Consider resuming maintenance insulin in a.m. based on CBGs. 2. Possible sepsis: Patient was hypothermic, hypotensive on arrival which all could have been due to hypoglycemia but cannot definitely rule out sepsis. Source of sepsis may be aspiration pneumonia/pneumonitis. Chest x-ray for now as negative but we will follow up repeat chest x-ray in a.m. Urine microscopy not impressive for UTI. Follow blood culture results. Continue empiric broad-spectrum IV antibiotics including vancomycin and Zosyn. If cultures return negative in the next 24-48 hours and chest x-ray remains negative, may consider discontinuing and monitoring off antibiotics. 3. Hypothermia: May be related to sepsis or hypoglycemia. Currently on warming blanket. We will check TSH. Treat underlying etiology. CT head without acute  findings. 4. Dehydration with mild hypernatremia: Brief IV half-normal saline. 5. Essential hypertension: Presented with hypotension. For now hold antihypertensives and treat with IV fluids. Reassess in a.m. 6. CVA with residual left hemiparesis and dysphagia: According to daughter, patient consumes regular consistency diet. For now we'll place on dysphagia 1 diet based on previous in-hospital speech therapy evaluation and will request speech therapist to reassess in a.m. 7. Chronic diastolic CHF: Compensated. 8. Dementia: Mental status has returned to baseline. Altered mental status was most likely secondary to hypo-glycemia/hypotension complicating underlying dementia. 9. Stage III chronic kidney disease: Follow BMP in a.m. 10. Prolonged QTC: Monitor on telemetry. Avoid precipitating medications. Check magnesium.    DVT prophylaxis: Lovenox Code Status: Full-confirmed with daughter at bedside  Family Communication: Discussed extensively with daughter at bedside  Disposition Plan: DC home when medically stable, possibly in the next 2-3 days.   Time spent: 65 minutes  Trinidy Masterson, MD, FACP, FHM. [facility] Pager [phone]  If 7PM-7AM, please contact night-coverage [email or web address] Password [facility] 10/13/2014, 5:45 PM

## 2014-10-13 NOTE — Progress Notes (Signed)
Patient admitted to 2C12 via stretcher from ED. Patient alert to self only. Denies pain at this time.

## 2014-10-13 NOTE — ED Notes (Addendum)
Pt arrives from home via [facility]. Pt was given insulin this AM, didn't eat breakfast. Pt was eating a banana and some toast for lunch when the pt began drooling and stopped swallowing. Pt daughter called EMS. Upon arrival CBG was 28. EMS reports the pt daughter was pouring OJ in the pt mouth. Pt still had food in the mouth and OJ.Pt received 25 G of D50, LAC IV infiltrated and EMS states cbg only came up to 40. LAC was d/c and 20 in LFA started.Pt received around 25 G of D10 in a NS mix. Pt also received bolus of NS PTA. EMS reports pt continued to drift in and out of sleep until given D10 mix and they state she became more alert. Pt has a hx of cva with left sided deficits.

## 2014-10-13 NOTE — ED Provider Notes (Signed)
CSN: [id number]     Arrival date & time 10/13/14  1304 History   First MD Initiated Contact with Patient 10/13/14 1313     Chief Complaint  Patient presents with  . Altered Mental Status     (Consider location/radiation/quality/duration/timing/severity/associated sxs/prior Treatment) HPI Comments: Patient presents to the emergency department with chief complaint of altered mental status. Reportedly patient did not eat breakfast this morning after she was given her insulin. She was then found by her daughter to be drooling and slightly slumped over. Daughter called EMS. Initial CBG was 28. EMS reports that the patient's daughter was pouring orange juice and the patient's mouth.  Patient was recently admitted for aspiration pneumonia. Patient received 25 g of D50 from EMS, but her IV infiltrated. An additional IV was started, and the patient was given an additional 25 g of D10 in normal saline and a 500 mL bolus. Hx unreliable given patient's mental status.  Level 5 caveat applies.  The history is provided by the patient. No language interpreter was used.    Past Medical History  Diagnosis Date  . UTI (urinary tract infection)   . Hypertension   . Diabetes mellitus   . Gout   . High cholesterol   . Complication of anesthesia   . Diabetic neuropathy   . PVD (peripheral vascular disease)   . Carotid artery occlusion   . Arthritis   . Chronic kidney disease   . GERD (gastroesophageal reflux disease)   . Vertigo   . Venous insufficiency   . Mood disorder   . Dysphagia   . Hemiparesis and alteration of sensations as late effects of stroke 12/09/2013   Past Surgical History  Procedure Laterality Date  . Hemorrhoid banding    . Abdominal hysterectomy    . Cardiac catheterization    . Lower extremity angiogram N/A 05/26/2013    Procedure: LOWER EXTREMITY ANGIOGRAM;  Surgeon: Nada Libman, MD;  Location: [facility] CATH LAB;  Service: Cardiovascular;  Laterality: N/A;   Family History   Problem Relation Age of Onset  . Heart disease Daughter   . Hypertension Daughter   . Diabetes Son   . Heart disease Son   . Diabetes Father   . Diabetes Sister   . Diabetes Sister    Social History  Substance Use Topics  . Smoking status: Never Smoker   . Smokeless tobacco: Never Used  . Alcohol Use: No   OB History    No data available     Review of Systems  Unable to perform ROS: Mental status change      Allergies  Review of patient's allergies indicates no known allergies.  Home Medications   Prior to Admission medications   Medication Sig Start Date End Date Taking? Authorizing Provider  acetaminophen (TYLENOL) 325 MG tablet Take 2 tablets (650 mg total) by mouth every 6 (six) hours as needed for mild pain (or Fever >/= 101). 05/21/14   Christiane Ha, MD  albuterol (PROVENTIL HFA;VENTOLIN HFA) 108 (90 BASE) MCG/ACT inhaler Inhale 2 puffs into the lungs every 6 (six) hours as needed for wheezing or shortness of breath.    Historical Provider, MD  albuterol (PROVENTIL) (2.5 MG/3ML) 0.083% nebulizer solution Take 2.5 mg by nebulization every 6 (six) hours.     Historical Provider, MD  allopurinol (ZYLOPRIM) 100 MG tablet Take 100 mg by mouth 2 (two) times daily.    Historical Provider, MD  amLODipine (NORVASC) 10 MG tablet Take 1 tablet (10  mg total) by mouth daily. 11/26/11   Catarina Hartshorn, MD  amoxicillin-clavulanate (AUGMENTIN) 500-125 MG per tablet Take 1 tablet (500 mg total) by mouth every 12 (twelve) hours. 08/16/14   Jeralyn Bennett, MD  aspirin EC 81 MG EC tablet Take 1 tablet (81 mg total) by mouth daily. 11/26/11   Catarina Hartshorn, MD  atorvastatin (LIPITOR) 20 MG tablet TAKE 1 TABLET BY MOUTH EVERY DAY 01/18/14   Margit Hanks, MD  cetirizine (ZYRTEC) 10 MG tablet Take 10 mg by mouth daily.    Historical Provider, MD  donepezil (ARICEPT) 10 MG tablet Take 10 mg by mouth at bedtime.     Historical Provider, MD  furosemide (LASIX) 20 MG tablet Take 20 mg by mouth  daily as needed for fluid.     Historical Provider, MD  gabapentin (NEURONTIN) 300 MG capsule Take 300 mg by mouth 3 (three) times daily.     Historical Provider, MD  insulin aspart (NOVOLOG) 100 UNIT/ML injection Inject 0-1 Units into the skin 3 (three) times daily before meals. Patient uses sliding scale    Historical Provider, MD  insulin lispro protamine-lispro (HUMALOG 75/25 MIX) (75-25) 100 UNIT/ML SUSP injection Inject 10-20 Units into the skin 2 (two) times daily with a meal. 20 units in am and 10 units in pm    Historical Provider, MD  Maltodextrin-Xanthan Gum (RESOURCE THICKENUP CLEAR) POWD Take 1 g by mouth as needed (Per diet order.). 08/16/14   Jeralyn Bennett, MD  Multiple Vitamin (MULTIVITAMIN WITH MINERALS) TABS tablet Take 1 tablet by mouth daily. 05/21/14   Christiane Ha, MD  risperiDONE (RISPERDAL) 0.5 MG tablet Take 0.5 mg by mouth at bedtime. For hallucinations    Historical Provider, MD  sertraline (ZOLOFT) 50 MG tablet Take 50 mg by mouth daily.    Historical Provider, MD  Wheat Dextrin (BENEFIBER) CHEW Chew 1 tablet by mouth daily.    Historical Provider, MD   BP 80/50 mmHg  Temp(Src) 96.3 F (35.7 C) (Rectal)  SpO2 98% Physical Exam  Constitutional: She is oriented to person, place, and time. She appears well-developed and well-nourished.  Frail elderly ill appearing  HENT:  Head: Normocephalic and atraumatic.  Eyes: Conjunctivae and EOM are normal. Pupils are equal, round, and reactive to light.  Neck: Normal range of motion. Neck supple.  Cardiovascular: Regular rhythm.  Exam reveals no gallop and no friction rub.   No murmur heard. bradycardic  Pulmonary/Chest: Effort normal. No respiratory distress. She has no wheezes. She has rales. She exhibits no tenderness.  Crackles in left lung field  Abdominal: Soft. Bowel sounds are normal. She exhibits no distension and no mass. There is no tenderness. There is no rebound and no guarding.  No focal abdominal  tenderness, no RLQ tenderness or pain at McBurney's point, no RUQ tenderness or Murphy's sign, no left-sided abdominal tenderness, no fluid wave, or signs of peritonitis   Musculoskeletal: Normal range of motion. She exhibits no edema or tenderness.  Neurological: She is alert and oriented to person, place, and time.  Skin: Skin is warm and dry.  Psychiatric: She has a normal mood and affect. Her behavior is normal. Judgment and thought content normal.  Nursing note and vitals reviewed.   ED Course  Procedures (including critical care time) Results for orders placed or performed during the hospital encounter of 10/13/14  Blood Culture (routine x 2)  Result Value Ref Range   Specimen Description BLOOD RIGHT HAND    Special Requests BOTTLES DRAWN  AEROBIC ONLY    Culture PENDING    Report Status PENDING   Blood Culture (routine x 2)  Result Value Ref Range   Specimen Description BLOOD LEFT ANTECUBITAL    Special Requests BOTTLES DRAWN AEROBIC ONLY    Culture PENDING    Report Status PENDING   Comprehensive metabolic panel  Result Value Ref Range   Sodium 146 (H) 135 - 145 mmol/L   Potassium 3.9 3.5 - 5.1 mmol/L   Chloride 107 101 - 111 mmol/L   CO2 31 22 - 32 mmol/L   Glucose, Bld 102 (H) 65 - 99 mg/dL   BUN 24 (H) 6 - 20 mg/dL   Creatinine, Ser 1.61 (H) 0.44 - 1.00 mg/dL   Calcium 8.6 (L) 8.9 - 10.3 mg/dL   Total Protein 6.7 6.5 - 8.1 g/dL   Albumin 2.5 (L) 3.5 - 5.0 g/dL   AST 22 15 - 41 U/L   ALT 9 (L) 14 - 54 U/L   Alkaline Phosphatase 61 38 - 126 U/L   Total Bilirubin 0.4 0.3 - 1.2 mg/dL   GFR calc non Af Amer 38 (L) >60 mL/min   GFR calc Af Amer 44 (L) >60 mL/min   Anion gap 8 5 - 15  CBC WITH DIFFERENTIAL  Result Value Ref Range   WBC 6.5 4.0 - 10.5 K/uL   RBC 4.27 3.87 - 5.11 MIL/uL   Hemoglobin 12.0 12.0 - 15.0 g/dL   HCT 09.6 04.5 - 40.9 %   MCV 92.5 78.0 - 100.0 fL   MCH 28.1 26.0 - 34.0 pg   MCHC 30.4 30.0 - 36.0 g/dL   RDW 81.1 (H) 91.4 - 78.2 %    Platelets 214 150 - 400 K/uL   Neutrophils Relative % 76 43 - 77 %   Neutro Abs 5.0 1.7 - 7.7 K/uL   Lymphocytes Relative 17 12 - 46 %   Lymphs Abs 1.1 0.7 - 4.0 K/uL   Monocytes Relative 6 3 - 12 %   Monocytes Absolute 0.4 0.1 - 1.0 K/uL   Eosinophils Relative 1 0 - 5 %   Eosinophils Absolute 0.1 0.0 - 0.7 K/uL   Basophils Relative 0 0 - 1 %   Basophils Absolute 0.0 0.0 - 0.1 K/uL  CBG monitoring, ED  Result Value Ref Range   Glucose-Capillary 95 65 - 99 mg/dL  I-Stat CG4 Lactic Acid, ED  (not at  [facility])  Result Value Ref Range   Lactic Acid, Venous 0.36 (L) 0.5 - 2.0 mmol/L  CBG monitoring, ED  Result Value Ref Range   Glucose-Capillary 119 (H) 65 - 99 mg/dL  I-Stat arterial blood gas, ED  Result Value Ref Range   pH, Arterial 7.327 (L) 7.350 - 7.450   pCO2 arterial 68.4 (HH) 35.0 - 45.0 mmHg   pO2, Arterial 121.0 (H) 80.0 - 100.0 mmHg   Bicarbonate 35.8 (H) 20.0 - 24.0 mEq/L   TCO2 38 0 - 100 mmol/L   O2 Saturation 98.0 %   Acid-Base Excess 8.0 (H) 0.0 - 2.0 mmol/L   Collection site RADIAL, ALLEN'S TEST ACCEPTABLE    Drawn by RT    Sample type ARTERIAL    Comment NOTIFIED PHYSICIAN    Ct Head Wo Contrast  10/13/2014   CLINICAL DATA:  Patient would not swallow while eating. Drooling. Altered mental status.  EXAM: CT HEAD WITHOUT CONTRAST  TECHNIQUE: Contiguous axial images were obtained from the base of the skull through the vertex without intravenous  contrast.  COMPARISON:  06/27/2014  FINDINGS: There is no evidence of mass effect, midline shift, or extra-axial fluid collections. There is no evidence of a space-occupying lesion or intracranial hemorrhage. There is no evidence of a cortical-based area of acute infarction. There is generalized cerebral atrophy. There is periventricular white matter low attenuation likely secondary to microangiopathy. There is an old left cerebellar infarct.  The ventricles and sulci are appropriate for the patient's age. The basal cisterns are  patent.  Visualized portions of the orbits are unremarkable. Bilateral maxillary sinus opacification with high density material within the maxillary sinuses likely reflecting inspissated mucus. Cerebrovascular atherosclerotic calcifications are noted.  The osseous structures are unremarkable.  IMPRESSION: 1.   1.  No acute intracranial pathology. 2. Chronic microvascular disease and cerebral atrophy. 2.   Electronically Signed   By: Elige Ko   On: 10/13/2014 15:02   Dg Chest Port 1 View  10/13/2014   CLINICAL DATA:  Rule out aspiration.  EXAM: PORTABLE CHEST - 1 VIEW  COMPARISON:  08/15/2014  FINDINGS: Heart size is enlarged. There is aortic atherosclerosis noted. No pleural effusion or edema. No airspace consolidation.  IMPRESSION: No evidence for aspiration.   Electronically Signed   By: Signa Kell M.D.   On: 10/13/2014 14:14     Imaging Review No results found.   EKG Interpretation None      MDM   Final diagnoses:  Sepsis, due to unspecified organism   Patient immediately discussed with Dr. Clarene Duke.  Patient FULL CODE as of 08/2014.  Will treat for sepsis.  Likely 2/2 aspiration.   Medications  sodium chloride 0.9 % bolus 1,000 mL (1,000 mLs Intravenous New Bag/Given 10/13/14 1446)  piperacillin-tazobactam (ZOSYN) IVPB 3.375 g (0 g Intravenous Stopped 10/13/14 1507)  vancomycin (VANCOCIN) IVPB 1000 mg/200 mL premix (0 mg Intravenous Stopped 10/13/14 1507)   Patient seen by and discussed with Dr. Clarene Duke, who agrees plan for admission for sepsis of unknown source. Patient does have crackles on lung exam.    CRITICAL CARE Performed by: Roxy Horseman   Total critical care time: 45  Critical care time was exclusive of separately billable procedures and treating other patients.  Critical care was necessary to treat or prevent imminent or life-threatening deterioration.  Critical care was time spent personally by me on the following activities: development of treatment plan  with patient and/or surrogate as well as nursing, discussions with consultants, evaluation of patient's response to treatment, examination of patient, obtaining history from patient or surrogate, ordering and performing treatments and interventions, ordering and review of laboratory studies, ordering and review of radiographic studies, pulse oximetry and re-evaluation of patient's condition.      Roxy Horseman, PA-C 10/13/14 1556  Laurence Spates, MD 10/13/14 [phone]

## 2014-10-14 ENCOUNTER — Inpatient Hospital Stay (HOSPITAL_COMMUNITY)

## 2014-10-14 DIAGNOSIS — I4581 Long QT syndrome: Secondary | ICD-10-CM

## 2014-10-14 DIAGNOSIS — F0390 Unspecified dementia without behavioral disturbance: Secondary | ICD-10-CM

## 2014-10-14 DIAGNOSIS — N183 Chronic kidney disease, stage 3 unspecified: Secondary | ICD-10-CM | POA: Diagnosis present

## 2014-10-14 DIAGNOSIS — IMO0001 Reserved for inherently not codable concepts without codable children: Secondary | ICD-10-CM | POA: Diagnosis present

## 2014-10-14 DIAGNOSIS — R9431 Abnormal electrocardiogram [ECG] [EKG]: Secondary | ICD-10-CM | POA: Diagnosis present

## 2014-10-14 DIAGNOSIS — E860 Dehydration: Secondary | ICD-10-CM

## 2014-10-14 DIAGNOSIS — I9589 Other hypotension: Secondary | ICD-10-CM | POA: Diagnosis present

## 2014-10-14 DIAGNOSIS — IMO0002 Reserved for concepts with insufficient information to code with codable children: Secondary | ICD-10-CM | POA: Diagnosis present

## 2014-10-14 DIAGNOSIS — I5032 Chronic diastolic (congestive) heart failure: Secondary | ICD-10-CM | POA: Diagnosis present

## 2014-10-14 DIAGNOSIS — I10 Essential (primary) hypertension: Secondary | ICD-10-CM

## 2014-10-14 DIAGNOSIS — I639 Cerebral infarction, unspecified: Secondary | ICD-10-CM

## 2014-10-14 DIAGNOSIS — E1165 Type 2 diabetes mellitus with hyperglycemia: Secondary | ICD-10-CM

## 2014-10-14 LAB — BASIC METABOLIC PANEL
ANION GAP: 7 (ref 5–15)
BUN: 18 mg/dL (ref 6–20)
CALCIUM: 8.2 mg/dL — AB (ref 8.9–10.3)
CO2: 32 mmol/L (ref 22–32)
CREATININE: 1.03 mg/dL — AB (ref 0.44–1.00)
Chloride: 106 mmol/L (ref 101–111)
GFR calc Af Amer: 52 mL/min — ABNORMAL LOW (ref >60)
GFR, EST NON AFRICAN AMERICAN: 45 mL/min — AB (ref >60)
GLUCOSE: 69 mg/dL (ref 65–99)
Potassium: 3.6 mmol/L (ref 3.5–5.1)
Sodium: 145 mmol/L (ref 135–145)

## 2014-10-14 LAB — GLUCOSE, CAPILLARY
GLUCOSE-CAPILLARY: 67 mg/dL (ref 65–99)
GLUCOSE-CAPILLARY: 76 mg/dL (ref 65–99)
GLUCOSE-CAPILLARY: 158 mg/dL — AB (ref 65–99)
GLUCOSE-CAPILLARY: 175 mg/dL — AB (ref 65–99)
Glucose-Capillary: 80 mg/dL (ref 65–99)
Glucose-Capillary: 110 mg/dL — ABNORMAL HIGH (ref 65–99)

## 2014-10-14 LAB — CBC
HCT: 33.9 % — ABNORMAL LOW (ref 36.0–46.0)
Hemoglobin: 10.4 g/dL — ABNORMAL LOW (ref 12.0–15.0)
MCH: 28.3 pg (ref 26.0–34.0)
MCHC: 30.7 g/dL (ref 30.0–36.0)
MCV: 92.4 fL (ref 78.0–100.0)
PLATELETS: 137 10*3/uL — AB (ref 150–400)
RBC: 3.67 MIL/uL — AB (ref 3.87–5.11)
RDW: 16.8 % — AB (ref 11.5–15.5)
WBC: 5.4 10*3/uL (ref 4.0–10.5)

## 2014-10-14 MED ORDER — DEXTROSE-NACL 5-0.45 % IV SOLN
INTRAVENOUS | Status: DC
  Administered 2014-10-14 – 2014-10-16 (×2): via INTRAVENOUS
  Administered 2014-10-16: 1000 mL via INTRAVENOUS
  Administered 2014-10-17: 01:00:00 via INTRAVENOUS

## 2014-10-14 MED ORDER — CETYLPYRIDINIUM CHLORIDE 0.05 % MT LIQD
7.0000 mL | Freq: Two times a day (BID) | OROMUCOSAL | Status: DC
  Administered 2014-10-14 – 2014-10-18 (×10): 7 mL via OROMUCOSAL

## 2014-10-14 MED ORDER — RESOURCE THICKENUP CLEAR PO POWD
ORAL | Status: DC | PRN
  Filled 2014-10-14: qty 125

## 2014-10-14 MED ORDER — SODIUM CHLORIDE 0.45 % IV SOLN: INTRAVENOUS | Status: DC

## 2014-10-14 NOTE — Progress Notes (Signed)
At approximately 0830 I walked into patient's room to perform shift assessment and noticed patient had a pronounced right side facial droop with significant drooling, speech difficult to understand. No facial droop was reported during shift change. Patient has stroke history with Left side paralysis. Dr. Joseph Art notified, stat MRI ordered. Phone call to daughter made and per daughter Dois Davenport patient does have facial drooping and does have periods of drooling. This information shared with Dr. Joseph Art; MRI to be completed. Will continue to monitor. Vitals stable with the exception of occasional periods of bradycardia.  Asher Muir Dezaree Tracey,RN

## 2014-10-14 NOTE — Progress Notes (Signed)
Utilization Review Completed.  

## 2014-10-14 NOTE — Evaluation (Signed)
Clinical/Bedside Swallow Evaluation Patient Details  Name: [name] MRN: [id number] Date of Birth: 19-Nov-1917  Today's Date: 10/14/2014 Time: SLP Start Time (ACUTE ONLY): 1355 SLP Stop Time (ACUTE ONLY): 1420 SLP Time Calculation (min) (ACUTE ONLY): 25 min  Past Medical History:  Past Medical History  Diagnosis Date  . UTI (urinary tract infection)   . Hypertension   . Diabetes mellitus   . Gout   . High cholesterol   . Complication of anesthesia   . Diabetic neuropathy   . PVD (peripheral vascular disease)   . Carotid artery occlusion   . Arthritis   . Chronic kidney disease   . GERD (gastroesophageal reflux disease)   . Vertigo   . Venous insufficiency   . Mood disorder   . Dysphagia   . Hemiparesis and alteration of sensations as late effects of stroke 12/09/2013   Past Surgical History:  Past Surgical History  Procedure Laterality Date  . Hemorrhoid banding    . Abdominal hysterectomy    . Cardiac catheterization    . Lower extremity angiogram N/A 05/26/2013    Procedure: LOWER EXTREMITY ANGIOGRAM;  Surgeon: Nada Libman, MD;  Location: [facility] CATH LAB;  Service: Cardiovascular;  Laterality: N/A;   HPI:  [name] is a 81 y.o. female , widowed, lives with daughter with 24/7 supervision/assistance, nonambulatory for the last 8 months at least and wheelchair mobile, PMH of DM 2/IDDM, HTN, HLD, gout, CVA with residual left hemiparesis, dysphagia, chronic kidney disease presented to [facility] ED on 10/13/14 with an episode of hypoglycemia and altered mental status. Pt seen repeatedly by SLP service MBS x2 recommends thin liquids with occasional deep penetration. However due to mulitple admissions with aspriation pna, pt has been recommended to consume nectar thick liquids. Daughter does thicken at home and also mashes pts food. No current acute finding on CXR.    Assessment / Plan / Recommendation Clinical Impression  Pt tolerates pureed and soft solids well, no signs of  aspiration observed with nectar thickened liquids. Trials of thin liquids do result in immediate cough. Recommend pt continue current diet (dys 1 puree/thin liquids) as she does at home. Would not recommend upgrade as pts CXR has remained clear with current diet. No SLP f/u needed. Will sign off.     Aspiration Risk  Moderate    Diet Recommendation Dysphagia 1 (Puree);Nectar   Medication Administration: Whole meds with puree    Other  Recommendations Oral Care Recommendations: Oral care BID Other Recommendations: Order thickener from pharmacy   Follow Up Recommendations       Frequency and Duration        Pertinent Vitals/Pain NA    SLP Swallow Goals     Swallow Study Prior Functional Status       General Other Pertinent Information: [name] is a 81 y.o. female , widowed, lives with daughter with 24/7 supervision/assistance, nonambulatory for the last 8 months at least and wheelchair mobile, PMH of DM 2/IDDM, HTN, HLD, gout, CVA with residual left hemiparesis, dysphagia, chronic kidney disease presented to [facility] ED on 10/13/14 with an episode of hypoglycemia and altered mental status. Pt seen repeatedly by SLP service MBS x2 recommends thin liquids with occasional deep penetration. However due to mulitple admissions with aspriation pna, pt has been recommended to consume nectar thick liquids. Daughter does thicken at home and also mashes pts food. No current acute finding on CXR.  Type of Study: Bedside swallow evaluation Diet Prior to this Study: Dysphagia 1 (  puree);Nectar-thick liquids Temperature Spikes Noted: No Respiratory Status: Room air History of Recent Intubation: No Behavior/Cognition: Alert;Cooperative;Pleasant mood Oral Cavity - Dentition: Edentulous Self-Feeding Abilities: Able to feed self Patient Positioning: Upright in bed Baseline Vocal Quality: Normal Volitional Cough: Strong Volitional Swallow: Able to elicit    Oral/Motor/Sensory Function Overall  Oral Motor/Sensory Function: Impaired at baseline Labial ROM: Reduced right Labial Symmetry: Abnormal symmetry right Labial Strength: Reduced Lingual ROM: Within Functional Limits Lingual Symmetry: Within Functional Limits Lingual Strength: Within Functional Limits Facial ROM: Within Functional Limits Facial Symmetry: Within Functional Limits Facial Strength: Within Functional Limits Facial Sensation: Within Functional Limits   Ice Chips     Thin Liquid Thin Liquid: Impaired Presentation: Cup Pharyngeal  Phase Impairments: Cough - Immediate    Nectar Thick Nectar Thick Liquid: Within functional limits Presentation: Cup   Honey Thick Honey Thick Liquid: Not tested   Puree Puree: Within functional limits Presentation: Spoon   Solid   GO    Solid: Not tested      Harlon Ditty, MA CCC-SLP [phone]  Claudine Mouton 10/14/2014,2:38 PM

## 2014-10-14 NOTE — Progress Notes (Signed)
[name] - Stepdown/ICU TEAM Progress Note  [name] WJX:[id number] DOB: 1917/11/11 DOA: 10/13/2014 PCP: [name], MD  Admit HPI / Brief Narrative: 81 y.o. BF widowed, lives with daughter with 24/7 supervision/assistance, nonambulatory for the last 8 months at least and wheelchair mobile PMHx   DM Type 2 HTN, HLD, Gout, CVA with residual left hemiparesis, dysphagia, CKD   Presented to [facility] ED on 10/13/14 with an episode of hypoglycemia and altered mental status. Patient unable to provide significant history secondary to altered mental status and possible dementia. She is alert and oriented partly to place and states that "I am fine" and denies complaints at this time. As per patient's daughter at bedside, patient was in her usual state of health until this morning. Fasting blood sugar was 103 mg per DL. Due to 3 visitors coming home this morning, family could not provide patient her morning breakfast. She however got her 70/25 insulin of 20 units at approximately 8 AM. At approximately 11 AM, daughter noticed that patient was cold and clammy and checked her CBG which was 30 mg per DL. Patient was sitting on chair and daughter started to feed her but she wouldn't take anything in. She attempted to give her some orange juice but patient was unable to swallow. She was drowsy but arousable and as per daughter, did not demonstrate any new stroke signs. As per EMS, CBG was 28 and found.reporting OJ in patient's mouth while patient still had food in the mouth. She was resuscitated with IV D 50 followed by IV D10. On arrival to the ED, patient was noted to be hypothermic, hypotensive. Lab work significant for sodium 146, CT head without acute abnormality and chest x-ray without acute abnormality. Sepsis protocol was initiated and patient was bolused with IV fluids and started on broad-spectrum IV antibiotics with the suspicion of aspiration pneumonitis/pneumonia. As per daughter, patient has  nearly returned to her baseline. Hospitalist admission was requested.   HPI/Subjective: 8/11 A/O 2, patient (does not know where, when), patient is very alert and answers all other questions appropriately. Patient's only request is that she be allowed to eat [place] food    Assessment/Plan: Type II DM/IDDM uncontrolled -6/10 hemoglobin A1c= 7.7 -Most likely secondary to insulin in the absence of a.m. Breakfast. -Patient refusing to consume food secondary to wanting only to eat [place] food. Encourage patient to eat R food until we can have meeting with her daughter and request that she cook [place] food.  -Place on sensitive SSI; would not try to control patient's CBGs closely as multiple studies have shown increased morbidity in older patients with tighter control.  Sepsis: Unspecified organism -On admission Hypothermic, Hypotensive  -CXR negative -Considering age and comorbidities will continue impaired antibiotics until all cultures return. -PCXR in a.m. - Chronic CVA -All strokes old, no new strokes found C MRI/CT scan below -left hemiparesis and mild dysphagia -PT/OT in a.m.  Hypothermia:  -May be related to sepsis or hypoglycemia. -TSH pending   Dehydration with mild hypernatremia -Resolved with hydration -Changed to D5-0.45% saline at 95ml/hr; patient not eating well  Essential hypertension/Hypotension:  -Continue to hold antihypertensives, patient hypotensive for a 81 year old.  Prolonged QT interval - Avoid precipitating medications..  Chronic diastolic CHF:  -Compensated. -Strict in and out -Daily weight  Dementia: -Baseline? Will discuss with daughter, arrange for meeting tomorrow -Believe must be very mild although patient has some difficulty such as remembering where, when able to carry on a coherent conversation. Able to  voice requests for needs, and follow commands.  Stage III chronic kidney disease:  -Follow closely   Goals of care  -Patient only  wish currently is to eat [place] food. Discussed with patient the possibility of food into her lungs and causing a fatal infection. Patient understands the risks and is willing to take them. Informed patient that, cafeteria would not be able to cook her [place] food, however I would be willing to speak with her daughter when she came to visit and explain the situation.    Code Status: FULL Family Communication: no family present at time of exam Disposition Plan: Have a conference with daughter    Consultants: NA  Procedure/Significant Events: 8/10 PCXR; enlarged heart. 8/10 CT head without contrast;-generalized cerebral atrophy. -Old left cerebellar infarct. 8/11 MRI brain without contrast;-Evolution of the right corona radiata and external capsule lacunar infarct seen in 2015.  -Superimposed chronic small vessel infarcts bilateral cerebellar hemispheres, the pons, and both thalami. -Suggestion of chronic blood products near the left frontal horn,   Culture 8/10 blood left AC/RIGHT HAND NGTD 8/10 urine NGTD 8/10 MRSA by PCR negative 8/11 strep pneumo urine antigen pending 8/11 Legionella urine antigen pending   Antibiotics: Zosyn 8/10>> Vancomycin 8/10>>   DVT prophylaxis: Lovenox   Devices    LINES / TUBES:      Continuous Infusions: . dextrose 5 % and 0.45% NaCl 75 mL/hr at 10/14/14 2141    Objective: VITAL SIGNS: Temp: 98.3 F (36.8 C) (08/11 2000) Temp Source: Oral (08/11 2000) BP: 136/69 mmHg (08/11 1633) Pulse Rate: 72 (08/11 1633) SPO2; FIO2:   Intake/Output Summary (Last 24 hours) at 10/14/14 2152 Last data filed at 10/14/14 2142  Gross per 24 hour  Intake   2041 ml  Output      0 ml  Net   2041 ml     Exam: General: A/O 2, patient (does not know where, when), patient is very alert and answers all other questions appropriately., No acute respiratory distress Eyes: Negative headache, eye pain, double vision,negative scleral  hemorrhage ENT: Negative Runny nose, negative ear pain, negative tinnitus, negative gingival bleeding Neck:  Negative scars, masses, torticollis, lymphadenopathy, JVD Lungs: Clear to auscultation bilaterally without wheezes or crackles Cardiovascular: Regular rate and rhythm without murmur gallop or rub normal S1 and S2 Abdomen:negative abdominal pain, negative dysphagia, nondistended, positive soft, bowel sounds, no rebound, no ascites, no appreciable mass Extremities: No significant cyanosis, clubbing, or edema bilateral lower extremities Psychiatric:  Negative depression, negative anxiety, negative fatigue, negative mania  Neurologic:  Cranial nerves II through XII intact, tongue/uvula midline, left hemi-paresis LUE/LLE strength Rangel-2/5 sensation intact. Right side extremities muscle strength 5/5, sensation intact,  negative dysarthria, negative expressive aphasia, negative receptive aphasia.      Data Reviewed: Basic Metabolic Panel:  Recent Labs Lab 10/13/14 1438 10/13/14 1830 10/14/14 0419  NA 146*  --  145  K 3.9  --  3.6  CL 107  --  106  CO2 31  --  32  GLUCOSE 102*  --  69  BUN 24*  --  18  CREATININE Rangel.18*  --  Rangel.03*  CALCIUM 8.6*  --  8.2*  MG  --  2.2  --    Liver Function Tests:  Recent Labs Lab 10/13/14 1438  AST 22  ALT 9*  ALKPHOS 61  BILITOT 0.4  PROT 6.7  ALBUMIN 2.5*   No results for input(s): LIPASE, AMYLASE in the last 168 hours. No results for input(s):  AMMONIA in the last 168 hours. CBC:  Recent Labs Lab 10/13/14 1438 10/14/14 0419  WBC 6.5 5.4  NEUTROABS 5.0  --   HGB 12.0 10.4*  HCT 39.5 33.9*  MCV 92.5 92.4  PLT 214 137*   Cardiac Enzymes: No results for input(s): CKTOTAL, CKMB, CKMBINDEX, TROPONINI in the last 168 hours. BNP (last 3 results)  Recent Labs  05/18/14 2056 08/13/14 1230  BNP 83.8 158.9*    ProBNP (last 3 results) No results for input(s): PROBNP in the last 8760 hours.  CBG:  Recent Labs Lab  10/14/14 0800 10/14/14 0846 10/14/14 1300 10/14/14 1713 10/14/14 2135  GLUCAP 80 76 110* 158* 175*    Recent Results (from the past 240 hour(s))  Blood Culture (routine x 2)     Status: None (Preliminary result)   Collection Time: 10/13/14  2:00 PM  Result Value Ref Range Status   Specimen Description BLOOD LEFT ANTECUBITAL  Final   Special Requests BOTTLES DRAWN AEROBIC ONLY  Final   Culture NO GROWTH < 24 HOURS  Final   Report Status PENDING  Incomplete  Blood Culture (routine x 2)     Status: None (Preliminary result)   Collection Time: 10/13/14  2:15 PM  Result Value Ref Range Status   Specimen Description BLOOD RIGHT HAND  Final   Special Requests BOTTLES DRAWN AEROBIC ONLY  Final   Culture NO GROWTH < 24 HOURS  Final   Report Status PENDING  Incomplete  Urine culture     Status: None (Preliminary result)   Collection Time: 10/13/14  3:41 PM  Result Value Ref Range Status   Specimen Description URINE, CATHETERIZED  Final   Special Requests NONE  Final   Culture NO GROWTH < 24 HOURS  Final   Report Status PENDING  Incomplete  MRSA PCR Screening     Status: None   Collection Time: 10/13/14  6:51 PM  Result Value Ref Range Status   MRSA by PCR NEGATIVE NEGATIVE Final    Comment:        The GeneXpert MRSA Assay (FDA approved for NASAL specimens only), is one component of a comprehensive MRSA colonization surveillance program. It is not intended to diagnose MRSA infection nor to guide or monitor treatment for MRSA infections.      Studies:  Recent x-ray studies have been reviewed in detail by the Attending Physician  Scheduled Meds:  Scheduled Meds: . allopurinol  100 mg Oral BID  . antiseptic oral rinse  7 mL Mouth Rinse BID  . aspirin EC  81 mg Oral Daily  . atorvastatin  20 mg Oral Daily  . donepezil  10 mg Oral QHS  . enoxaparin (LOVENOX) injection  30 mg Subcutaneous Q24H  . gabapentin  300 mg Oral TID  . insulin aspart  0-9 Units  Subcutaneous TID WC  . loratadine  10 mg Oral Daily  . piperacillin-tazobactam (ZOSYN)  IV  3.375 g Intravenous Q8H  . psyllium  Rangel packet Oral Daily  . risperiDONE  0.25 mg Oral QHS  . sertraline  50 mg Oral Daily  . sodium chloride  3 mL Intravenous Q12H  . vancomycin  Rangel,000 mg Intravenous Q24H    Time spent on care of this patient: 40 mins   Prabhav Faulkenberry, Roselind Messier , MD  [facility] Office  [phone] Pager - [phone]  On-Call/Text Page:      [email or web address]      password [email or web address]  If 7PM-7AM, please contact night-coverage [email or web address]  Password [email or web address] 10/14/2014, 9:52 PM   LOS: Rangel day   Care during the described time interval was provided by me .  I have reviewed this patient's available data, including medical history, events of note, physical examination, and all test results as part of my evaluation. I have personally reviewed and interpreted all radiology studies.   Carolyne Littles, MD [phone] Pager

## 2014-10-14 NOTE — Progress Notes (Signed)
[name], Tyliyah- [name]-2C-Room 12-[facility]-GIP Visit-[name] RN, BSN  This is a related admission to her [facility] diagnosis of Alzheimer's Disease per Dr. Jamie Brookes.  Patient is a Full Code.  Per [facility] chart review, multiple discussions have occurred regarding code status and upon transfer to [facility] ED, Full Code still reflects patient's wishes.  Patient transported to [facility] ED via EMS after a reported blood sugar of 30.  She received D50 in transport.  Upon arrival to ED, she was hypothermic and bradycardic.  Clinical call center notified after EMS arrived in the home.  Patient seen in room laying in bed.  Significant facial droop to right side of face and moderate amount of clear, frothy oral secretions noted. Patient's speech garbled and appears lethargic and is unable to engage in conversation at this time.  According to staff RN, patient is having occasional episodes of bradycardia. Patient is receiving Vancomycin and Zosyn IV. FLACC 2/10 and per chart review, no pain medications given in the past 24 hours.  Updated [facility] social worker, [place] on patient status.  [facility] transfer summary and medication list placed on chart.  [facility] will continue to follow.  Please call with any questions.  Chelsea Aus RN, BSN [facility] Liaison [phone]

## 2014-10-14 NOTE — Progress Notes (Signed)
SW made visit to the hospital after learning of her admission .  SW did talk to [facility] [place], who gave report of facial drooping and speech difficulties.  Patient's eyes were closed but she awoke easily and was alert.  It was more difficult than usual to understand what she would try to say.  Some facial drooping noted.  SW did talk to [facility] and agreed with suggestion of having a Goals of Care meeting.   No family present during visit.  SW will continue to follow and assess for discharge planning as needed.  Maretta Los, LCSW [facility] [phone]

## 2014-10-15 DIAGNOSIS — N183 Chronic kidney disease, stage 3 unspecified: Secondary | ICD-10-CM | POA: Diagnosis present

## 2014-10-15 DIAGNOSIS — R9431 Abnormal electrocardiogram [ECG] [EKG]: Secondary | ICD-10-CM | POA: Diagnosis present

## 2014-10-15 DIAGNOSIS — I69359 Hemiplegia and hemiparesis following cerebral infarction affecting unspecified side: Secondary | ICD-10-CM | POA: Diagnosis present

## 2014-10-15 LAB — CBC WITH DIFFERENTIAL/PLATELET
Basophils Absolute: 0 10*3/uL (ref 0.0–0.1)
Basophils Relative: 0 % (ref 0–1)
EOS PCT: 4 % (ref 0–5)
Eosinophils Absolute: 0.3 10*3/uL (ref 0.0–0.7)
HCT: 32.2 % — ABNORMAL LOW (ref 36.0–46.0)
Hemoglobin: 9.9 g/dL — ABNORMAL LOW (ref 12.0–15.0)
LYMPHS ABS: 1.7 10*3/uL (ref 0.7–4.0)
Lymphocytes Relative: 26 % (ref 12–46)
MCH: 27.9 pg (ref 26.0–34.0)
MCHC: 30.7 g/dL (ref 30.0–36.0)
MCV: 90.7 fL (ref 78.0–100.0)
MONO ABS: 0.4 10*3/uL (ref 0.1–1.0)
Monocytes Relative: 6 % (ref 3–12)
NEUTROS ABS: 4.1 10*3/uL (ref 1.7–7.7)
Neutrophils Relative %: 64 % (ref 43–77)
PLATELETS: 207 10*3/uL (ref 150–400)
RBC: 3.55 MIL/uL — AB (ref 3.87–5.11)
RDW: 16.4 % — ABNORMAL HIGH (ref 11.5–15.5)
WBC: 6.5 10*3/uL (ref 4.0–10.5)

## 2014-10-15 LAB — COMPREHENSIVE METABOLIC PANEL
ALT: 9 U/L — ABNORMAL LOW (ref 14–54)
ANION GAP: 7 (ref 5–15)
AST: 18 U/L (ref 15–41)
Albumin: 2.1 g/dL — ABNORMAL LOW (ref 3.5–5.0)
Alkaline Phosphatase: 60 U/L (ref 38–126)
BUN: 13 mg/dL (ref 6–20)
CALCIUM: 8.1 mg/dL — AB (ref 8.9–10.3)
CHLORIDE: 103 mmol/L (ref 101–111)
CO2: 30 mmol/L (ref 22–32)
CREATININE: 1 mg/dL (ref 0.44–1.00)
GFR calc Af Amer: 54 mL/min — ABNORMAL LOW (ref >60)
GFR calc non Af Amer: 47 mL/min — ABNORMAL LOW (ref >60)
Glucose, Bld: 167 mg/dL — ABNORMAL HIGH (ref 65–99)
Potassium: 3.7 mmol/L (ref 3.5–5.1)
Sodium: 140 mmol/L (ref 135–145)
TOTAL PROTEIN: 5.4 g/dL — AB (ref 6.5–8.1)
Total Bilirubin: 0.4 mg/dL (ref 0.3–1.2)

## 2014-10-15 LAB — TSH: TSH: 1.69 u[IU]/mL (ref 0.350–4.500)

## 2014-10-15 LAB — URINE CULTURE: CULTURE: NO GROWTH

## 2014-10-15 LAB — TROPONIN I
Troponin I: <0.03 ng/mL (ref <0.031)
Troponin I: <0.03 ng/mL (ref <0.031)
Troponin I: <0.03 ng/mL (ref <0.031)

## 2014-10-15 LAB — GLUCOSE, CAPILLARY
GLUCOSE-CAPILLARY: 313 mg/dL — AB (ref 65–99)
Glucose-Capillary: 139 mg/dL — ABNORMAL HIGH (ref 65–99)
Glucose-Capillary: 149 mg/dL — ABNORMAL HIGH (ref 65–99)
Glucose-Capillary: 180 mg/dL — ABNORMAL HIGH (ref 65–99)

## 2014-10-15 LAB — MAGNESIUM: Magnesium: 1.9 mg/dL (ref 1.7–2.4)

## 2014-10-15 NOTE — Progress Notes (Signed)
TEAM 1 - Stepdown/ICU TEAM Progress Note  [name] WUJ:[id number] DOB: Nov 12, 1917 DOA: 10/13/2014 PCP: [name], MD  Admit HPI / Brief Narrative: 81 y.o. BF widowed, lives with daughter with 24/7 supervision/assistance, nonambulatory for the last 8 months at least and wheelchair mobile PMHx   DM Type 2 HTN, HLD, Gout, CVA with residual left hemiparesis, dysphagia, CKD   Presented to [facility] ED on 10/13/14 with an episode of hypoglycemia and altered mental status. Patient unable to provide significant history secondary to altered mental status and possible dementia. She is alert and oriented partly to place and states that "I am fine" and denies complaints at this time. As per patient's daughter at bedside, patient was in her usual state of health until this morning. Fasting blood sugar was 103 mg per DL. Due to 3 visitors coming home this morning, family could not provide patient her morning breakfast. She however got her 70/25 insulin of 20 units at approximately 8 AM. At approximately 11 AM, daughter noticed that patient was cold and clammy and checked her CBG which was 30 mg per DL. Patient was sitting on chair and daughter started to feed her but she wouldn't take anything in. She attempted to give her some orange juice but patient was unable to swallow. She was drowsy but arousable and as per daughter, did not demonstrate any new stroke signs. As per EMS, CBG was 28 and found.reporting OJ in patient's mouth while patient still had food in the mouth. She was resuscitated with IV D 50 followed by IV D10. On arrival to the ED, patient was noted to be hypothermic, hypotensive. Lab work significant for sodium 146, CT head without acute abnormality and chest x-ray without acute abnormality. Sepsis protocol was initiated and patient was bolused with IV fluids and started on broad-spectrum IV antibiotics with the suspicion of aspiration pneumonitis/pneumonia. As per daughter, patient has  nearly returned to her baseline. Hospitalist admission was requested.   HPI/Subjective: 8/12 A/O 1, patient (does not know where, when, why), patient cannot recall conversation last night concerning her wish to eat [place] food. Denies this conversation ever occurred, and states we are trying to starve her with a dysphagia 1 diet.    Assessment/Plan: Type II DM/IDDM uncontrolled -6/10 hemoglobin A1c= 7.7 -Most likely secondary to insulin in the absence of a.m. Breakfast. -Patient eating very little secondary to extreme dislike of dysphagia 1 diet. At this may be more of a mental health benefit to allow patient to eat before choosing and except the risks associated with aspiration. This will need to be discussed with her family members -Patient refusing to consume food secondary to wanting only to eat [place] food. Encourage patient to eat dysphagia 1 diet, patient refuses.  -Place on sensitive SSI; would not try to control patient's CBGs closely as multiple studies have shown increased morbidity in older patients with tighter control.  Sepsis: Unspecified organism -On admission Hypothermic, Hypotensive  -CXR negative -Considering age and comorbidities will continue impaired antibiotics until all cultures return. -Not fully convinced patient has sepsis, patient's daughter to arrive today will discuss the seeing oral antibiotics as most likely secondary to dehydration/anorexia. -PCXR in a.m 8/13. - Chronic CVA -All strokes old, no new strokes found see MRI/CT scan below -Lt hemiparesis and mild dysphagia -PT/OT in a.m.  Hypothermia:  -May be related to sepsis or hypoglycemia. -TSH pending   Dehydration with mild hypernatremia -Resolved with hydration -Changed to D5-0.45% saline at 56ml/hr; patient not eating well  Essential Hypertension/Hypotension:  -Continue to hold antihypertensives, patient hypotensive for a 81 year old.   Prolonged QT interval - Avoid precipitating  medications..  Chronic diastolic CHF:  -Compensated. -Strict in and out+ 3.1 L -Daily weight  Dementia: -Baseline? Will discuss with daughter, arrange for meeting tomorrow -8/12 appears to ask and wane as patient cannot recall a 30-40 minute conversation we had yesterday concerning the type of food she wanted to eat, and the risk associated with eating that time food. Patient still insists she wants to eat regular food. -Given her age don't think this is unreasonable however will need to discuss with family.  Stage III chronic kidney disease:  -Follow closely   Goals of care  -Patient only wish currently is to eat [place] food. Discussed with patient the possibility of food into her lungs and causing a fatal infection. Patient understands the risks and is willing to take them. Informed patient that, cafeteria would not be able to cook her [place] food, however I would be willing to speak with her daughter when she came to visit and explain the situation.    Code Status: FULL Family Communication: no family present at time of exam Disposition Plan: Have a conference with daughter    Consultants: NA  Procedure/Significant Events: 8/10 PCXR; enlarged heart. 8/10 CT head without contrast;-generalized cerebral atrophy. -Old left cerebellar infarct. 8/11 MRI brain without contrast;-Evolution of the right corona radiata and external capsule lacunar infarct seen in 2015.  -Superimposed chronic small vessel infarcts bilateral cerebellar hemispheres, the pons, and both thalami. -Suggestion of chronic blood products near the left frontal horn,   Culture 8/10 blood left AC/RIGHT HAND NGTD 8/10 urine NGTD 8/10 MRSA by PCR negative 8/11 strep pneumo urine antigen pending 8/11 Legionella urine antigen pending   Antibiotics: Zosyn 8/10>> Vancomycin 8/10>>   DVT prophylaxis: Lovenox   Devices    LINES / TUBES:      Continuous Infusions: . dextrose 5 % and 0.45% NaCl 75  mL/hr at 10/14/14 2141    Objective: VITAL SIGNS: Temp: 97.9 F (36.6 C) (08/12 0800) Temp Source: Oral (08/12 0800) BP: 115/55 mmHg (08/12 0800) Pulse Rate: 55 (08/12 0800) SPO2; FIO2:   Intake/Output Summary (Last 24 hours) at 10/15/14 1246 Last data filed at 10/15/14 0600  Gross per 24 hour  Intake 1624.25 ml  Output      0 ml  Net 1624.25 ml     Exam: General: A/O 1, patient (does not know where, when, why), patient is very alert and fixated on eating food that she enjoys. No acute respiratory distress Eyes: Negative headache, eye pain, double vision,negative scleral hemorrhage ENT: Negative Runny nose, negative ear pain, negative tinnitus, negative gingival bleeding Neck:  Negative scars, masses, torticollis, lymphadenopathy, JVD Lungs: Clear to auscultation bilaterally without wheezes or crackles Cardiovascular: Regular rate and rhythm without murmur gallop or rub normal S1 and S2 Abdomen:negative abdominal pain, negative dysphagia, nondistended, positive soft, bowel sounds, no rebound, no ascites, no appreciable mass Extremities: No significant cyanosis, clubbing, or edema bilateral lower extremities Psychiatric:  Negative depression, negative anxiety, negative fatigue, negative mania  Neurologic:  Cranial nerves II through XII intact, tongue/uvula midline, left hemi-paresis LUE/LLE strength 1-2/5 sensation intact. Right side extremities muscle strength 5/5, sensation intact,  negative dysarthria, negative expressive aphasia, negative receptive aphasia.      Data Reviewed: Basic Metabolic Panel:  Recent Labs Lab 10/13/14 1438 10/13/14 1830 10/14/14 0419 10/15/14 0450  NA 146*  --  145 140  K 3.9  --  3.6 3.7  CL 107  --  106 103  CO2 31  --  32 30  GLUCOSE 102*  --  69 167*  BUN 24*  --  18 13  CREATININE 1.18*  --  1.03* 1.00  CALCIUM 8.6*  --  8.2* 8.1*  MG  --  2.2  --  1.9   Liver Function Tests:  Recent Labs Lab 10/13/14 1438 10/15/14 0450    AST 22 18  ALT 9* 9*  ALKPHOS 61 60  BILITOT 0.4 0.4  PROT 6.7 5.4*  ALBUMIN 2.5* 2.1*   No results for input(s): LIPASE, AMYLASE in the last 168 hours. No results for input(s): AMMONIA in the last 168 hours. CBC:  Recent Labs Lab 10/13/14 1438 10/14/14 0419 10/15/14 0450  WBC 6.5 5.4 6.5  NEUTROABS 5.0  --  4.1  HGB 12.0 10.4* 9.9*  HCT 39.5 33.9* 32.2*  MCV 92.5 92.4 90.7  PLT 214 137* 207   Cardiac Enzymes:  Recent Labs Lab 10/14/14 2335 10/15/14 0450 10/15/14 0958  TROPONINI <0.03 <0.03 <0.03   BNP (last 3 results)  Recent Labs  05/18/14 2056 08/13/14 1230  BNP 83.8 158.9*    ProBNP (last 3 results) No results for input(s): PROBNP in the last 8760 hours.  CBG:  Recent Labs Lab 10/14/14 0800 10/14/14 0846 10/14/14 1300 10/14/14 1713 10/14/14 2135  GLUCAP 80 76 110* 158* 175*    Recent Results (from the past 240 hour(s))  Blood Culture (routine x 2)     Status: None (Preliminary result)   Collection Time: 10/13/14  2:00 PM  Result Value Ref Range Status   Specimen Description BLOOD LEFT ANTECUBITAL  Final   Special Requests BOTTLES DRAWN AEROBIC ONLY  Final   Culture NO GROWTH < 24 HOURS  Final   Report Status PENDING  Incomplete  Blood Culture (routine x 2)     Status: None (Preliminary result)   Collection Time: 10/13/14  2:15 PM  Result Value Ref Range Status   Specimen Description BLOOD RIGHT HAND  Final   Special Requests BOTTLES DRAWN AEROBIC ONLY  Final   Culture NO GROWTH < 24 HOURS  Final   Report Status PENDING  Incomplete  Urine culture     Status: None   Collection Time: 10/13/14  3:41 PM  Result Value Ref Range Status   Specimen Description URINE, CATHETERIZED  Final   Special Requests NONE  Final   Culture NO GROWTH 2 DAYS  Final   Report Status 10/15/2014 FINAL  Final  MRSA PCR Screening     Status: None   Collection Time: 10/13/14  6:51 PM  Result Value Ref Range Status   MRSA by PCR NEGATIVE NEGATIVE Final     Comment:        The GeneXpert MRSA Assay (FDA approved for NASAL specimens only), is one component of a comprehensive MRSA colonization surveillance program. It is not intended to diagnose MRSA infection nor to guide or monitor treatment for MRSA infections.      Studies:  Recent x-ray studies have been reviewed in detail by the Attending Physician  Scheduled Meds:  Scheduled Meds: . allopurinol  100 mg Oral BID  . antiseptic oral rinse  7 mL Mouth Rinse BID  . aspirin EC  81 mg Oral Daily  . atorvastatin  20 mg Oral Daily  . donepezil  10 mg Oral QHS  . enoxaparin (LOVENOX) injection  30 mg Subcutaneous Q24H  . gabapentin  300 mg Oral TID  . insulin aspart  0-9 Units Subcutaneous TID WC  . loratadine  10 mg Oral Daily  . piperacillin-tazobactam (ZOSYN)  IV  3.375 g Intravenous Q8H  . psyllium  1 packet Oral Daily  . risperiDONE  0.25 mg Oral QHS  . sertraline  50 mg Oral Daily  . sodium chloride  3 mL Intravenous Q12H  . vancomycin  1,000 mg Intravenous Q24H    Time spent on care of this patient: 40 mins   Nakisha Chai, Roselind Messier , MD  [facility] Office  [phone] Pager - [phone]  On-Call/Text Page:      [email or web address]      password [email or web address]  If 7PM-7AM, please contact night-coverage [email or web address] Password [email or web address] 10/15/2014, 12:46 PM   LOS: 2 days   Care during the described time interval was provided by me .  I have reviewed this patient's available data, including medical history, events of note, physical examination, and all test results as part of my evaluation. I have personally reviewed and interpreted all radiology studies.   Carolyne Littles, MD [phone] Pager

## 2014-10-15 NOTE — Evaluation (Signed)
Occupational Therapy Evaluation Patient Details Name: [name] MRN: [id number] DOB: May 25, 1917 Today's Date: 10/15/2014    History of Present Illness 81 y.o. female , widowed, lives with daughter with 24/7 supervision/assistance, nonambulatory for the last 8 months at least and wheelchair mobile, PMH of DM 2/IDDM, HTN, HLD, gout, CVA with residual left hemiparesis, dysphagia, chronic kidney disease presented to [facility] ED on 10/13/14 with an episode of hypoglycemia and altered mental status. On arrival to the ED, CT head negative for acute event.  Per daughter pt has nearly returned to baseline   Clinical Impression   This 81 yo female admitted with above presents to acute OT with decreased cognition, decreased mobility, decreased balance, decreased use of left side (pre-morbid) all affecting her ability to care for herself or A with her care. She would benefit from continued therapy at SNF if she wishes to work on increasing her independence with BADLs.    Follow Up Recommendations  SNF    Equipment Recommendations   (hosptial bed, hoyer lift)       Precautions / Restrictions Precautions Precautions: Fall Restrictions Weight Bearing Restrictions: No      Mobility Bed Mobility Overal bed mobility: Needs Assistance Bed Mobility: Rolling;Supine to Sit Rolling:  (max a for rolling right , min A for rolling left)   Supine to sit: Total assist;+2 for physical assistance;HOB elevated     General bed mobility comments: Patient mostly unable to participate in bed mobility transfer. Required total assist for trunk elevation, and LE off the bed.  Transfers Overall transfer level: Needs assistance Equipment used:  (2 person A with help of gait belt and bed pad as well) Transfers: Squat Pivot Transfers   Stand pivot transfers: Total assist;+2 physical assistance Squat pivot transfers: Total assist;+2 physical assistance     General transfer comment: L LE unable to weight bear,  required total assist for squat pivot transfer. Patient unable to initiate standing without assistance.    Balance Overall balance assessment: Needs assistance Sitting-balance support: Feet supported;No upper extremity supported Sitting balance-Leahy Scale: Fair Sitting balance - Comments: Pt sat EOB approximately 5 minutes with only verbal cues for correcting posterior (tends to lean left)   Standing balance support: Bilateral upper extremity supported Standing balance-Leahy Scale: Zero Standing balance comment: Patient unable to stand unsupported at this time.                            ADL Overall ADL's : Needs assistance/impaired Eating/Feeding: Maximal assistance;Sitting   Grooming: Maximal assistance;Sitting   Upper Body Bathing: Maximal assistance;Sitting   Lower Body Bathing: Total assistance;Bed level   Upper Body Dressing : Maximal assistance;Sitting   Lower Body Dressing: Total assistance;Bed level   Toilet Transfer: +2 for physical assistance;Total assistance;Squat-pivot (bed>recliner going to pt's right)   Toileting- Clothing Manipulation and Hygiene: Total assistance;Sit to/from stand               Vision Additional Comments: No change from baseline           Pertinent Vitals/Pain Pain Assessment: No/denies pain     Hand Dominance Right   Extremity/Trunk Assessment Upper Extremity Assessment Upper Extremity Assessment: LUE deficits/detail LUE Deficits / Details: increased tone, can move arm but non functional LUE Coordination: decreased fine motor;decreased gross motor   Lower Extremity Assessment Lower Extremity Assessment: LLE deficits/detail;Generalized weakness LLE Deficits / Details: No ability to weight bear through L LE. Required total assist for stand pivot  transfer and bed mobility.       Communication Communication Communication:  (very thick accent)   Cognition Arousal/Alertness: Awake/alert Behavior During Therapy:  Agitated (not understanding why she is here, even when told why) Overall Cognitive Status: History of cognitive impairments - at baseline Area of Impairment: Orientation Orientation Level: Disoriented to;Situation ("they say I am at the hospital")   Memory: Decreased short-term memory Following Commands: Follows one step commands inconsistently;Follows one step commands with increased time Safety/Judgement: Decreased awareness of safety;Decreased awareness of deficits     General Comments: Patient is very frustrated as to why she is in the hospital, is adamant that nothing is wrong with her and that she should not be here.              Home Living Family/patient expects to be discharged to:: Private residence Living Arrangements: Children Available Help at Discharge: Family;Available 24 hours/day                             Additional Comments: Unable to get living situation from patient due to being poor historian ad possibly confused; per chart patient does live with daughter who cares for her.      Prior Functioning/Environment Level of Independence: Needs assistance  Gait / Transfers Assistance Needed: Per prior report, patient unable to ambulate and requires assistance for transfers and bed mobility.     Comments: Unable to glean information from patient directly.    OT Diagnosis: Generalized weakness;Cognitive deficits;Hemiplegia non-dominant side   OT Problem List: Decreased strength;Decreased range of motion;Decreased activity tolerance;Impaired balance (sitting and/or standing);Decreased cognition;Decreased safety awareness;Impaired tone;Decreased knowledge of use of DME or AE   OT Treatment/Interventions: Self-care/ADL training;Patient/family education;Balance training;Therapeutic activities;DME and/or AE instruction    OT Goals(Current goals can be found in the care plan section) Acute Rehab OT Goals Patient Stated Goal: Go home OT Goal Formulation:  With patient Time For Goal Achievement: 10/22/14 Potential to Achieve Goals: Good  OT Frequency: Min 2X/week           Co-evaluation PT/OT/SLP Co-Evaluation/Treatment: Yes Reason for Co-Treatment: Complexity of the patient's impairments (multi-system involvement) PT goals addressed during session: Mobility/safety with mobility OT goals addressed during session: ADL's and self-care;Strengthening/ROM      End of Session Equipment Utilized During Treatment: Gait belt (use of bed pad) Nurse Communication: Mobility status  Activity Tolerance: Patient tolerated treatment well Patient left: in chair;with chair alarm set   Time: 1610-9604 OT Time Calculation (min): 39 min Charges:  OT General Charges $OT Visit: 1 Procedure OT Evaluation $Initial OT Evaluation Tier I: 1 Procedure OT Treatments $Self Care/Home Management : 8-22 mins  Evette Georges [phone] 10/15/2014, 1:48 PM

## 2014-10-15 NOTE — Progress Notes (Signed)
[name], Diann- [place]-2C-Room 12-[facility] Visit-[name] RN  This is a related admission to her [facility] diagnosis of Alzheimer's Disease per Dr. Jamie Brookes. Patient is a Full Code. Per [facility] chart review, multiple discussions have occurred regarding code status and upon transfer to [facility] ED, Full Code still reflects patient's wishes. Patient transported to [facility] ED via EMS after a reported blood sugar of 30. She received D50 in transport. Upon arrival to ED, she was hypothermic and bradycardic. Clinical call center notified after EMS arrived in the home.  Patient sitting up in chair with 75% of lunch eaten.Lab tech in to draw blood and pt. would cry and yell when needle was inserted. Lab was unable to get a blood return and pt. advised another phlebotomist would come. Liaison tried to calm patient but she kept saying she did not like the food and wanted a banana. Staff RN, Annabelle Harman reports no changes in condition today and no orders indicating d/c date.No family present at the bedside.   [facility] transfer summary and medication list remain on shadow chart.  [facility] will continue to follow.  Please call with any questions.  Sharen Heck RN [facility] Liaison [phone]

## 2014-10-15 NOTE — Evaluation (Signed)
Physical Therapy Evaluation Patient Details Name: [name] MRN: [id number] DOB: 1917/07/08 Today's Date: 10/15/2014   History of Present Illness  81 y.o. female , widowed, lives with daughter with 24/7 supervision/assistance, nonambulatory for the last 8 months at least and wheelchair mobile, PMH of DM 2/IDDM, HTN, HLD, gout, CVA with residual left hemiparesis, dysphagia, chronic kidney disease presented to [facility] ED on 10/13/14 with an episode of hypoglycemia and altered mental status. On arrival to the ED, patient was noted to be hypothermic, hypotensive. Lab work significant for sodium 146, CT head without acute abnormality and chest x-ray without acute abnormality. Sepsis protocol was initiated and patient was bolused with IV fluids and started on broad-spectrum IV antibiotics with the suspicion of aspiration pneumonitis/pneumonia. As per daughter, patient has nearly returned to her baseline.  Clinical Impression  Patient found in bed. Patient agitated and upset throughout session, repeatedly saying how she is confused why she is here. She is oriented to place but claims she was walking just yesterday or at other times just last week. She is unaware of how much assistance was needed to transfer her to the chair. Patient will benefit from continued PT to address decreased bed mobility and transfers.     Follow Up Recommendations SNF;Supervision/Assistance - 24 hour (Family may not agree to SNF placement.)    Equipment Recommendations  Wheelchair (measurements PT);Hospital bed;Other (comment) Michiel Sites lift.)    Recommendations for Other Services       Precautions / Restrictions Precautions Precautions: Fall Restrictions Weight Bearing Restrictions: No      Mobility  Bed Mobility Overal bed mobility: Needs Assistance Bed Mobility: Rolling;Supine to Sit Rolling: Max assist;Min assist (Max for R rolling, min for L rolling.)   Supine to sit: Total assist;+2 for physical  assistance;HOB elevated     General bed mobility comments: Patient mostly unable to participate in bed mobility transfer. Required total assist for trunk elevation, and LE off the bed.  Transfers Overall transfer level: Needs assistance Equipment used: 2 person hand held assist Transfers: Stand Pivot Transfers   Stand pivot transfers: Total assist;+2 physical assistance       General transfer comment: L LE unable to weight bear, required total assist for stand pivot transfer. Patient unable to initiate standing without assistance.  Ambulation/Gait                Stairs            Wheelchair Mobility    Modified Rankin (Stroke Patients Only) Modified Rankin (Stroke Patients Only) Pre-Morbid Rankin Score: Severe disability (Based on previous notes.) Modified Rankin: Severe disability     Balance Overall balance assessment: Needs assistance Sitting-balance support: Feet supported;No upper extremity supported Sitting balance-Leahy Scale: Good Sitting balance - Comments: Was able to withstand sitting balance for ~5 mins with no support.   Standing balance support: Bilateral upper extremity supported Standing balance-Leahy Scale: Zero Standing balance comment: Patient unable to stand unsupported at this time.                             Pertinent Vitals/Pain Pain Assessment: No/denies pain    Home Living Family/patient expects to be discharged to:: Private residence Living Arrangements: Children Available Help at Discharge: Family;Available 24 hours/day             Additional Comments: Unable to get living situation from patient due to being poor historian ad possibly confused; patient does live with daughter who  cares for her.    Prior Function Level of Independence: Needs assistance   Gait / Transfers Assistance Needed: Per prior report, patient unable to ambulate and requires assistance for transfers and bed mobility.     Comments:  Unable to glean information from patient directly.     Hand Dominance        Extremity/Trunk Assessment   Upper Extremity Assessment: Defer to OT evaluation           Lower Extremity Assessment: LLE deficits/detail;Generalized weakness   LLE Deficits / Details: No ability to weight bear through L LE. Required total assist for stand pivot transfer and bed mobility.     Communication   Communication: Other (comment) (Very thick accent.)  Cognition Arousal/Alertness: Awake/alert Behavior During Therapy: Agitated Overall Cognitive Status: History of cognitive impairments - at baseline Area of Impairment: Orientation;Following commands;Safety/judgement;Awareness;Memory Orientation Level: Disoriented to;Situation   Memory: Decreased short-term memory Following Commands: Follows one step commands inconsistently;Follows one step commands with increased time Safety/Judgement: Decreased awareness of safety;Decreased awareness of deficits     General Comments: Patient is very frustrated as to why she is in the hospital, is adamant that nothing is wrong with her and that she should not be here.    General Comments      Exercises        Assessment/Plan    PT Assessment Patient needs continued PT services  PT Diagnosis Difficulty walking;Generalized weakness;Hemiplegia non-dominant side;Altered mental status   PT Problem List Decreased strength;Decreased range of motion;Decreased activity tolerance;Decreased balance;Decreased mobility;Decreased cognition;Decreased safety awareness  PT Treatment Interventions Functional mobility training;Therapeutic activities;Therapeutic exercise;Balance training;Neuromuscular re-education;Patient/family education;Wheelchair mobility training   PT Goals (Current goals can be found in the Care Plan section) Acute Rehab PT Goals Patient Stated Goal: Go home PT Goal Formulation: With patient Time For Goal Achievement: 10/29/14 Potential to  Achieve Goals: Poor    Frequency Min 3X/week   Barriers to discharge   High level transfer needs as well as nursing needs.    Co-evaluation PT/OT/SLP Co-Evaluation/Treatment: Yes Reason for Co-Treatment: Complexity of the patient's impairments (multi-system involvement);Necessary to address cognition/behavior during functional activity PT goals addressed during session: Mobility/safety with mobility         End of Session Equipment Utilized During Treatment: Gait belt;Oxygen Activity Tolerance: Treatment limited secondary to agitation Patient left: in chair;with call bell/phone within reach;with chair alarm set Nurse Communication: Mobility status;Need for lift equipment;Precautions         Time: 1610-9604 PT Time Calculation (min) (ACUTE ONLY): 39 min   Charges:   PT Evaluation $Initial PT Evaluation Tier I: 1 Procedure     PT G CodesMichele Rockers, SPT [phone]  10/15/2014, 11:46 AM I have read, reviewed and agree with student's note.   [facility] [phone] (pager)

## 2014-10-16 ENCOUNTER — Inpatient Hospital Stay (HOSPITAL_COMMUNITY)

## 2014-10-16 DIAGNOSIS — I270 Primary pulmonary hypertension: Secondary | ICD-10-CM

## 2014-10-16 DIAGNOSIS — J189 Pneumonia, unspecified organism: Secondary | ICD-10-CM

## 2014-10-16 LAB — COMPREHENSIVE METABOLIC PANEL
ALT: 11 U/L — ABNORMAL LOW (ref 14–54)
ANION GAP: 3 — AB (ref 5–15)
AST: 18 U/L (ref 15–41)
Albumin: 2.3 g/dL — ABNORMAL LOW (ref 3.5–5.0)
Alkaline Phosphatase: 65 U/L (ref 38–126)
BILIRUBIN TOTAL: 0.3 mg/dL (ref 0.3–1.2)
BUN: 7 mg/dL (ref 6–20)
CO2: 35 mmol/L — ABNORMAL HIGH (ref 22–32)
Calcium: 8.4 mg/dL — ABNORMAL LOW (ref 8.9–10.3)
Chloride: 102 mmol/L (ref 101–111)
Creatinine, Ser: 0.85 mg/dL (ref 0.44–1.00)
GFR calc Af Amer: >60 mL/min (ref >60)
GFR calc non Af Amer: 57 mL/min — ABNORMAL LOW (ref >60)
Glucose, Bld: 167 mg/dL — ABNORMAL HIGH (ref 65–99)
POTASSIUM: 3.3 mmol/L — AB (ref 3.5–5.1)
Sodium: 140 mmol/L (ref 135–145)
Total Protein: 6.4 g/dL — ABNORMAL LOW (ref 6.5–8.1)

## 2014-10-16 LAB — CBC WITH DIFFERENTIAL/PLATELET
BASOS ABS: 0 10*3/uL (ref 0.0–0.1)
BASOS PCT: 0 % (ref 0–1)
Eosinophils Absolute: 0.3 10*3/uL (ref 0.0–0.7)
Eosinophils Relative: 5 % (ref 0–5)
HCT: 34.3 % — ABNORMAL LOW (ref 36.0–46.0)
HEMOGLOBIN: 10.6 g/dL — AB (ref 12.0–15.0)
LYMPHS PCT: 29 % (ref 12–46)
Lymphs Abs: 1.7 10*3/uL (ref 0.7–4.0)
MCH: 28 pg (ref 26.0–34.0)
MCHC: 30.9 g/dL (ref 30.0–36.0)
MCV: 90.5 fL (ref 78.0–100.0)
MONO ABS: 0.4 10*3/uL (ref 0.1–1.0)
MONOS PCT: 7 % (ref 3–12)
Neutro Abs: 3.4 10*3/uL (ref 1.7–7.7)
Neutrophils Relative %: 58 % (ref 43–77)
PLATELETS: 210 10*3/uL (ref 150–400)
RBC: 3.79 MIL/uL — ABNORMAL LOW (ref 3.87–5.11)
RDW: 15.9 % — AB (ref 11.5–15.5)
WBC: 5.9 10*3/uL (ref 4.0–10.5)

## 2014-10-16 LAB — MAGNESIUM: Magnesium: 1.8 mg/dL (ref 1.7–2.4)

## 2014-10-16 LAB — GLUCOSE, CAPILLARY
GLUCOSE-CAPILLARY: 173 mg/dL — AB (ref 65–99)
Glucose-Capillary: 189 mg/dL — ABNORMAL HIGH (ref 65–99)
Glucose-Capillary: 194 mg/dL — ABNORMAL HIGH (ref 65–99)
Glucose-Capillary: 200 mg/dL — ABNORMAL HIGH (ref 65–99)
Glucose-Capillary: 180 mg/dL — ABNORMAL HIGH (ref 65–99)

## 2014-10-16 NOTE — Progress Notes (Signed)
The patient was experiencing asymptomatic nonsustained bradycardia.  Lenny Pastel NP was notified and advised that it would be best to continue to monitor the patient while she remained asymptomatic.  No further action was taken at this time.  Will continue to monitor.

## 2014-10-16 NOTE — Progress Notes (Signed)
Rangel, [name]- [place]-2C-Room 12-[facility] [place] Visit-[name] RN  This is a related admission to her [facility] diagnosis of Alzheimer's Disease per Dr. Jamie Brookes. Patient is a Full Code. Per [facility] chart review, multiple discussions have occurred regarding code status and upon transfer to [facility] ED, Full Code still reflects patient's wishes. Patient transported to [facility] ED via EMS after a reported blood sugar of 30. She received D50 in transport. Upon arrival to ED, she was hypothermic and bradycardic. Clinical call center notified after EMS arrived in the home.  Patient sitting up in the bed this morning with regular respirations and O2 at 4L n/c. Pt. denies any pain and denies any shortness of breath. Patient had a banana in her lap and this RN assisted her with opening it. Pt. States " I eat a banana every day." Patient appears very calm and relaxed today. No family at bedside. Staff RN, Joyce Gross in room preparing pt's meds and reports no new orders or d/c plans at this time. Pt and RN advised [facility] will f/u again tomorrow.  [facility] transfer summary and medication list remain on shadow chart.  [facility] will continue to follow.  Please call with any questions.  Sharen Heck RN [facility] Liaison [phone]

## 2014-10-16 NOTE — Progress Notes (Signed)
ANTIBIOTIC CONSULT NOTE - Follow Up  Pharmacy Consult for Vancomycin and Zosyn Indication: rule out sepsis  No Known Allergies  Patient Measurements: Weight: 143 lb 4.8 oz (65 kg)  Vital Signs: Temp: 97.7 F (36.5 C) (08/13 0848) Temp Source: Oral (08/13 0848) BP: 158/75 mmHg (08/13 0848) Pulse Rate: 51 (08/13 0848) Intake/Output from previous day: 08/12 0701 - 08/13 0700 In: 1003 [I.V.:903; IV Piggyback:100] Out: -  Intake/Output from this shift: Total I/O In: 3 [I.V.:3] Out: -   Labs:  Recent Labs  10/14/14 0419 10/15/14 0450 10/16/14 0257  WBC 5.4 6.5 5.9  HGB 10.4* 9.9* 10.6*  PLT 137* 207 210  CREATININE 1.03* 1.00 0.85   Estimated Creatinine Clearance: 34.9 mL/min (by C-G formula based on Cr of 0.85).  Microbiology: Recent Results (from the past 720 hour(s))  Blood Culture (routine x 2)     Status: None (Preliminary result)   Collection Time: 10/13/14  2:00 PM  Result Value Ref Range Status   Specimen Description BLOOD LEFT ANTECUBITAL  Final   Special Requests BOTTLES DRAWN AEROBIC ONLY  Final   Culture NO GROWTH 2 DAYS  Final   Report Status PENDING  Incomplete  Blood Culture (routine x 2)     Status: None (Preliminary result)   Collection Time: 10/13/14  2:15 PM  Result Value Ref Range Status   Specimen Description BLOOD RIGHT HAND  Final   Special Requests BOTTLES DRAWN AEROBIC ONLY  Final   Culture NO GROWTH 2 DAYS  Final   Report Status PENDING  Incomplete  Urine culture     Status: None   Collection Time: 10/13/14  3:41 PM  Result Value Ref Range Status   Specimen Description URINE, CATHETERIZED  Final   Special Requests NONE  Final   Culture NO GROWTH 2 DAYS  Final   Report Status 10/15/2014 FINAL  Final  MRSA PCR Screening     Status: None   Collection Time: 10/13/14  6:51 PM  Result Value Ref Range Status   MRSA by PCR NEGATIVE NEGATIVE Final    Comment:        The GeneXpert MRSA Assay (FDA approved for NASAL  specimens only), is one component of a comprehensive MRSA colonization surveillance program. It is not intended to diagnose MRSA infection nor to guide or monitor treatment for MRSA infections.    Medical History: Past Medical History  Diagnosis Date  . UTI (urinary tract infection)   . Hypertension   . Diabetes mellitus   . Gout   . High cholesterol   . Complication of anesthesia   . Diabetic neuropathy   . PVD (peripheral vascular disease)   . Carotid artery occlusion   . Arthritis   . Chronic kidney disease   . GERD (gastroesophageal reflux disease)   . Vertigo   . Venous insufficiency   . Mood disorder   . Dysphagia   . Hemiparesis and alteration of sensations as late effects of stroke 12/09/2013   Medications:  Prescriptions prior to admission  Medication Sig Dispense Refill Last Dose  . acetaminophen (TYLENOL) 325 MG tablet Take 2 tablets (650 mg total) by mouth every 6 (six) hours as needed for mild pain (or Fever >/= 101).   Past Month at Unknown time  . albuterol (PROVENTIL HFA;VENTOLIN HFA) 108 (90 BASE) MCG/ACT inhaler Inhale 2 puffs into the lungs every 6 (six) hours as needed for wheezing or shortness of breath.   Past Month at Unknown time  .  albuterol (PROVENTIL) (2.5 MG/3ML) 0.083% nebulizer solution Take 2.5 mg by nebulization every 6 (six) hours.    not used  . allopurinol (ZYLOPRIM) 100 MG tablet Take 100 mg by mouth 2 (two) times daily.   10/13/2014 at Unknown time  . amLODipine (NORVASC) 10 MG tablet Take 1 tablet (10 mg total) by mouth daily. 30 tablet 3 10/13/2014 at Unknown time  . aspirin EC 81 MG EC tablet Take 1 tablet (81 mg total) by mouth daily.   10/13/2014 at Unknown time  . atorvastatin (LIPITOR) 20 MG tablet TAKE 1 TABLET BY MOUTH EVERY DAY 30 tablet 0 10/13/2014 at Unknown time  . cetirizine (ZYRTEC) 10 MG tablet Take 10 mg by mouth daily.   10/13/2014 at Unknown time  . donepezil (ARICEPT) 10 MG tablet Take 10 mg by mouth at bedtime.    10/12/2014  at Unknown time  . furosemide (LASIX) 20 MG tablet Take 20 mg by mouth daily as needed for fluid.    Past Month at Unknown time  . gabapentin (NEURONTIN) 300 MG capsule Take 300 mg by mouth 3 (three) times daily.    10/13/2014 at Unknown time  . insulin aspart (NOVOLOG) 100 UNIT/ML injection Inject 0-1 Units into the skin 3 (three) times daily before meals. Patient uses sliding scale   10/12/2014 at Unknown time  . insulin lispro protamine-lispro (HUMALOG 75/25 MIX) (75-25) 100 UNIT/ML SUSP injection Inject 10-20 Units into the skin 2 (two) times daily with a meal. 20 units in am and 10 units in pm   10/13/2014 at Unknown time  . Maltodextrin-Xanthan Gum (RESOURCE THICKENUP CLEAR) POWD Take 1 g by mouth as needed (Per diet order.). 1 Can 0 10/13/2014 at Unknown time  . risperiDONE (RISPERDAL) 0.5 MG tablet Take 0.5 mg by mouth at bedtime. For hallucinations   10/12/2014 at Unknown time  . sertraline (ZOLOFT) 50 MG tablet Take 50 mg by mouth daily.   10/13/2014 at Unknown time  . Wheat Dextrin (BENEFIBER) CHEW Chew 1 tablet by mouth daily.   10/12/2014 at Unknown time  . amoxicillin-clavulanate (AUGMENTIN) 500-125 MG per tablet Take 1 tablet (500 mg total) by mouth every 12 (twelve) hours. 10 tablet 0   . Multiple Vitamin (MULTIVITAMIN WITH MINERALS) TABS tablet Take 1 tablet by mouth daily.   Past Week at Unknown time   Assessment: 81yo female with history of HTN, DM2, PVD, CKD, and dysphagia presents with AMS. Pharmacy is consulted to dose vancomycin and zosyn for suspected sepsis. Pt has tolerated antibiotics without noted complications.  She is afebrile and her WBC is now WNL.  No cultures obtained.  Regimen: Vancomycin 1gm IV every 24 hours Zosyn 3.375 gm every 8 hours  Goal of Therapy:  Vancomycin trough level 15-20 mcg/ml  Plan: Continue Vancomycin 1g IV q24h Continue Zosyn 3.375g IV q8h  Will need to determine LOT for IV antibiotics  Nadara Mustard, PharmD., MS Clinical Pharmacist Pager:   [phone] Thank you for allowing pharmacy to be part of this patients care team. 10/16/2014,11:16 AM

## 2014-10-16 NOTE — Progress Notes (Signed)
[place] TEAM 1 - Stepdown/ICU TEAM Progress Note  [name] ZOX:[id number] DOB: [birth year] DOA: 10/13/2014 PCP: [name], MD  Admit HPI / Brief Narrative: 81 y.o. BF widowed, lives with daughter with 24/7 supervision/assistance, nonambulatory for the last 8 months at least and wheelchair mobile PMHx   DM Type 2 HTN, HLD, Gout, CVA with residual left hemiparesis, dysphagia, CKD   Presented to [facility] ED on 10/13/14 with an episode of hypoglycemia and altered mental status. Patient unable to provide significant history secondary to altered mental status and possible dementia. She is alert and oriented partly to place and states that "I am fine" and denies complaints at this time. As per patient's daughter at bedside, patient was in her usual state of health until this morning. Fasting blood sugar was 103 mg per DL. Due to 3 visitors coming home this morning, family could not provide patient her morning breakfast. She however got her 70/25 insulin of 20 units at approximately 8 AM. At approximately 11 AM, daughter noticed that patient was cold and clammy and checked her CBG which was 30 mg per DL. Patient was sitting on chair and daughter started to feed her but she wouldn't take anything in. She attempted to give her some orange juice but patient was unable to swallow. She was drowsy but arousable and as per daughter, did not demonstrate any new stroke signs. As per EMS, CBG was 28 and found.reporting OJ in patient's mouth while patient still had food in the mouth. She was resuscitated with IV D 50 followed by IV D10. On arrival to the ED, patient was noted to be hypothermic, hypotensive. Lab work significant for sodium 146, CT head without acute abnormality and chest x-ray without acute abnormality. Sepsis protocol was initiated and patient was bolused with IV fluids and started on broad-spectrum IV antibiotics with the suspicion of aspiration pneumonitis/pneumonia. As per daughter, patient has  nearly returned to her baseline. Hospitalist admission was requested.   HPI/Subjective: 8/13 A/O 1, patient (does not know where, when, why), very calm, states ate a banana this morning.   Assessment/Plan: Type II DM/IDDM uncontrolled -6/10 hemoglobin A1c= 7.7 -Most likely secondary to insulin in the absence of a.m. Breakfast. -Patient eating very little secondary to extreme dislike of dysphagia 1 diet. At this may be more of a mental health benefit to allow patient to eat before choosing and except the risks associated with aspiration. This will need to be discussed with her family members - Encourage patient to eat dysphagia 1 diet, patient refuses.  -Place on sensitive SSI; would not try to control patient's CBGs closely as multiple studies have shown increased morbidity in older patients with tighter control. -Spoke with daughter last night and daughter was able to assist patient in eating her dinner. Informed daughter that if patient had food she ate at home which could be brought here we would feed patient that food.   Sepsis: Unspecified organism/CAP? -On admission Hypothermic, Hypotensive  -CXR negative -PCXR 8/13; consolidation LLL concerning for pneumonia -Continue current antibiotics; could change over to PO in a.m.  Chronic CVA -All strokes old, no new strokes found see MRI/CT scan below -Lt hemiparesis and mild dysphagia -PT/OT pending   Hypothermia:  -May be related to sepsis or hypoglycemia. -TSH WNL  Dehydration with mild hypernatremia -Resolved with hydration -Decrease D5-0.45% saline to 30 ml/hr  Essential Hypertension/Hypotension:  -Continue to hold antihypertensives, patient hypotensive for a 81 year old.  Prolonged QT interval - Avoid precipitating medications..  Chronic diastolic  CHF:  -Compensated. -Strict in and out+ 3.1 L -Daily weight  Severe LVH -Compensated  Pulmonary hypertension -Compensated  Dementia: -Baseline? Will discuss with  daughter, arrange for meeting tomorrow -Daughter to bring in food that patient enjoys eating, will store some meals in our refrigerator.   Stage III chronic kidney disease:  -Follow closely      Code Status: FULL Family Communication: no family present at time of exam Disposition Plan:     Consultants: NA  Procedure/Significant Events: 05/31/2013 echocardiogram - Left ventricle: severe focal basal and moderate concentric hypertrophy the septum. LVEF= 60% to 65%.  -(grade 1 diastolic dysfunction).  -Tricuspid valve: Moderate regurgitation. - Pulmonary arteries: PA peak pressure: 41mm Hg (S). 8/10 PCXR; enlarged heart. 8/10 CT head without contrast;-generalized cerebral atrophy. -Old left cerebellar infarct. 8/11 MRI brain without contrast;-Evolution of the right corona radiata and external capsule lacunar infarct seen in 2015.  -Superimposed chronic small vessel infarcts bilateral cerebellar hemispheres, the pons, and both thalami. -Suggestion of chronic blood products near the left frontal horn, 8/13 PCXR;Consolidation LLL concerning PNA -Possible small left pleural effusion.- Moderate cardiomegaly with pulmonary venous congestion,    Culture 8/10 blood left AC/RIGHT HAND NGTD 8/10 urine NGTD 8/10 MRSA by PCR negative 8/11 strep pneumo urine antigen pending 8/11 Legionella urine antigen pending   Antibiotics: Zosyn 8/10>> Vancomycin 8/10>>   DVT prophylaxis: Lovenox   Devices    LINES / TUBES:      Continuous Infusions: . dextrose 5 % and 0.45% NaCl 75 mL/hr at 10/16/14 0412    Objective: VITAL SIGNS: Temp: 97.9 F (36.6 C) (08/13 1205) Temp Source: Oral (08/13 1205) BP: 162/82 mmHg (08/13 1205) Pulse Rate: 51 (08/13 0848) SPO2; FIO2:   Intake/Output Summary (Last 24 hours) at 10/16/14 1450 Last data filed at 10/16/14 1200  Gross per 24 hour  Intake   1631 ml  Output      0 ml  Net   1631 ml     Exam: General: A/O 1, patient (does  not know where, when, why), patient is very alert and fixated on eating food that she enjoys. No acute respiratory distress Eyes: Negative headache, eye pain, double vision,negative scleral hemorrhage ENT: Negative Runny nose, negative ear pain, negative tinnitus, negative gingival bleeding Neck:  Negative scars, masses, torticollis, lymphadenopathy, JVD Lungs: Clear to auscultation bilaterally without wheezes or crackles Cardiovascular: Regular rate and rhythm without murmur gallop or rub normal S1 and S2 Abdomen:negative abdominal pain, negative dysphagia, nondistended, positive soft, bowel sounds, no rebound, no ascites, no appreciable mass Extremities: No significant cyanosis, clubbing, or edema bilateral lower extremities Psychiatric:  Negative depression, negative anxiety, negative fatigue, negative mania  Neurologic:  Cranial nerves II through XII intact, tongue/uvula midline, left hemi-paresis LUE/LLE strength 1-2/5 sensation intact. Right side extremities muscle strength 5/5, sensation intact,  negative dysarthria, negative expressive aphasia, negative receptive aphasia.      Data Reviewed: Basic Metabolic Panel:  Recent Labs Lab 10/13/14 1438 10/13/14 1830 10/14/14 0419 10/15/14 0450 10/16/14 0257  NA 146*  --  145 140 140  K 3.9  --  3.6 3.7 3.3*  CL 107  --  106 103 102  CO2 31  --  32 30 35*  GLUCOSE 102*  --  69 167* 167*  BUN 24*  --  CREATININE 1.18*  --  1.03* 1.00 0.85  CALCIUM 8.6*  --  8.2* 8.1* 8.4*  MG  --  2.2  --  1.9 1.8  Liver Function Tests:  Recent Labs Lab 10/13/14 1438 10/15/14 0450 10/16/14 0257  AST 22 18 18   ALT 9* 9* 11*  ALKPHOS 61 60 65  BILITOT 0.4 0.4 0.3  PROT 6.7 5.4* 6.4*  ALBUMIN 2.5* 2.1* 2.3*   No results for input(s): LIPASE, AMYLASE in the last 168 hours. No results for input(s): AMMONIA in the last 168 hours. CBC:  Recent Labs Lab 10/13/14 1438 10/14/14 0419 10/15/14 0450 10/16/14 0257  WBC 6.5 5.4 6.5  5.9  NEUTROABS 5.0  --  4.1 3.4  HGB 12.0 10.4* 9.9* 10.6*  HCT 39.5 33.9* 32.2* 34.3*  MCV 92.5 92.4 90.7 90.5  PLT 214 137* 207 210   Cardiac Enzymes:  Recent Labs Lab 10/14/14 2335 10/15/14 0450 10/15/14 0958  TROPONINI <0.03 <0.03 <0.03   BNP (last 3 results)  Recent Labs  05/18/14 2056 08/13/14 1230  BNP 83.8 158.9*    ProBNP (last 3 results) No results for input(s): PROBNP in the last 8760 hours.  CBG:  Recent Labs Lab 10/15/14 1230 10/15/14 1641 10/15/14 2126 10/16/14 0938 10/16/14 1210  GLUCAP 149* 180* 313* 189* 194*    Recent Results (from the past 240 hour(s))  Blood Culture (routine x 2)     Status: None (Preliminary result)   Collection Time: 10/13/14  2:00 PM  Result Value Ref Range Status   Specimen Description BLOOD LEFT ANTECUBITAL  Final   Special Requests BOTTLES DRAWN AEROBIC ONLY  Final   Culture NO GROWTH 3 DAYS  Final   Report Status PENDING  Incomplete  Blood Culture (routine x 2)     Status: None (Preliminary result)   Collection Time: 10/13/14  2:15 PM  Result Value Ref Range Status   Specimen Description BLOOD RIGHT HAND  Final   Special Requests BOTTLES DRAWN AEROBIC ONLY  Final   Culture NO GROWTH 3 DAYS  Final   Report Status PENDING  Incomplete  Urine culture     Status: None   Collection Time: 10/13/14  3:41 PM  Result Value Ref Range Status   Specimen Description URINE, CATHETERIZED  Final   Special Requests NONE  Final   Culture NO GROWTH 2 DAYS  Final   Report Status 10/15/2014 FINAL  Final  MRSA PCR Screening     Status: None   Collection Time: 10/13/14  6:51 PM  Result Value Ref Range Status   MRSA by PCR NEGATIVE NEGATIVE Final    Comment:        The GeneXpert MRSA Assay (FDA approved for NASAL specimens only), is one component of a comprehensive MRSA colonization surveillance program. It is not intended to diagnose MRSA infection nor to guide or monitor treatment for MRSA infections.       Studies:  Recent x-ray studies have been reviewed in detail by the Attending Physician  Scheduled Meds:  Scheduled Meds: . allopurinol  100 mg Oral BID  . antiseptic oral rinse  7 mL Mouth Rinse BID  . aspirin EC  81 mg Oral Daily  . atorvastatin  20 mg Oral Daily  . donepezil  10 mg Oral QHS  . enoxaparin (LOVENOX) injection  30 mg Subcutaneous Q24H  . gabapentin  300 mg Oral TID  . insulin aspart  0-9 Units Subcutaneous TID WC  . loratadine  10 mg Oral Daily  . piperacillin-tazobactam (ZOSYN)  IV  3.375 g Intravenous Q8H  . psyllium  1 packet Oral Daily  . risperiDONE  0.25 mg Oral QHS  .  sertraline  50 mg Oral Daily  . sodium chloride  3 mL Intravenous Q12H  . vancomycin  1,000 mg Intravenous Q24H    Time spent on care of this patient: 40 mins   WOODS, Roselind Messier , MD  [facility] Office  [phone] Pager - [phone]  On-Call/Text Page:      [email or web address]      password [email or web address]  If 7PM-7AM, please contact night-coverage [email or web address] Password [email or web address] 10/16/2014, 2:50 PM   LOS: 3 days   Care during the described time interval was provided by me .  I have reviewed this patient's available data, including medical history, events of note, physical examination, and all test results as part of my evaluation. I have personally reviewed and interpreted all radiology studies.   Carolyne Littles, MD [phone] Pager

## 2014-10-17 DIAGNOSIS — I517 Cardiomegaly: Secondary | ICD-10-CM

## 2014-10-17 DIAGNOSIS — J189 Pneumonia, unspecified organism: Secondary | ICD-10-CM | POA: Diagnosis present

## 2014-10-17 DIAGNOSIS — I639 Cerebral infarction, unspecified: Secondary | ICD-10-CM | POA: Diagnosis present

## 2014-10-17 LAB — CBC WITH DIFFERENTIAL/PLATELET
BASOS ABS: 0 10*3/uL (ref 0.0–0.1)
BASOS PCT: 1 % (ref 0–1)
EOS ABS: 0.4 10*3/uL (ref 0.0–0.7)
Eosinophils Relative: 6 % — ABNORMAL HIGH (ref 0–5)
HEMATOCRIT: 33.6 % — AB (ref 36.0–46.0)
Hemoglobin: 10.4 g/dL — ABNORMAL LOW (ref 12.0–15.0)
LYMPHS PCT: 27 % (ref 12–46)
Lymphs Abs: 1.7 10*3/uL (ref 0.7–4.0)
MCH: 28.3 pg (ref 26.0–34.0)
MCHC: 31 g/dL (ref 30.0–36.0)
MCV: 91.3 fL (ref 78.0–100.0)
Monocytes Absolute: 0.4 10*3/uL (ref 0.1–1.0)
Monocytes Relative: 6 % (ref 3–12)
Neutro Abs: 3.8 10*3/uL (ref 1.7–7.7)
Neutrophils Relative %: 60 % (ref 43–77)
PLATELETS: 217 10*3/uL (ref 150–400)
RBC: 3.68 MIL/uL — ABNORMAL LOW (ref 3.87–5.11)
RDW: 15.7 % — ABNORMAL HIGH (ref 11.5–15.5)
WBC: 6.2 10*3/uL (ref 4.0–10.5)

## 2014-10-17 LAB — COMPREHENSIVE METABOLIC PANEL
ALBUMIN: 2.3 g/dL — AB (ref 3.5–5.0)
ALK PHOS: 58 U/L (ref 38–126)
ALT: 11 U/L — AB (ref 14–54)
AST: 18 U/L (ref 15–41)
Anion gap: 6 (ref 5–15)
CHLORIDE: 98 mmol/L — AB (ref 101–111)
CO2: 35 mmol/L — AB (ref 22–32)
Calcium: 8.3 mg/dL — ABNORMAL LOW (ref 8.9–10.3)
Creatinine, Ser: 0.78 mg/dL (ref 0.44–1.00)
GFR calc Af Amer: >60 mL/min (ref >60)
GFR calc non Af Amer: >60 mL/min (ref >60)
GLUCOSE: 164 mg/dL — AB (ref 65–99)
POTASSIUM: 3.4 mmol/L — AB (ref 3.5–5.1)
SODIUM: 139 mmol/L (ref 135–145)
TOTAL PROTEIN: 6.4 g/dL — AB (ref 6.5–8.1)
Total Bilirubin: 0.5 mg/dL (ref 0.3–1.2)

## 2014-10-17 LAB — GLUCOSE, CAPILLARY
Glucose-Capillary: 119 mg/dL — ABNORMAL HIGH (ref 65–99)
Glucose-Capillary: 139 mg/dL — ABNORMAL HIGH (ref 65–99)
Glucose-Capillary: 161 mg/dL — ABNORMAL HIGH (ref 65–99)
Glucose-Capillary: 248 mg/dL — ABNORMAL HIGH (ref 65–99)

## 2014-10-17 LAB — MAGNESIUM: Magnesium: 1.7 mg/dL (ref 1.7–2.4)

## 2014-10-17 MED ORDER — AZITHROMYCIN 250 MG PO TABS
250.0000 mg | ORAL_TABLET | Freq: Every day | ORAL | Status: DC
  Administered 2014-10-17: 250 mg via ORAL
  Filled 2014-10-17 (×2): qty 1

## 2014-10-17 MED ORDER — CEFUROXIME AXETIL 250 MG PO TABS
250.0000 mg | ORAL_TABLET | Freq: Two times a day (BID) | ORAL | Status: DC
  Administered 2014-10-17: 250 mg via ORAL
  Filled 2014-10-17 (×4): qty 1

## 2014-10-17 NOTE — Progress Notes (Signed)
[place] TEAM 1 - Stepdown/ICU TEAM Progress Note  [name] ZOX:[id number] DOB: 1917/03/31 DOA: 10/13/2014 PCP: [name], MD  Admit HPI / Brief Narrative: 81 y.o. BF widowed, lives with daughter with 24/7 supervision/assistance, nonambulatory for the last 8 months at least and wheelchair mobile PMHx   DM Type 2 HTN, HLD, Gout, CVA with residual left hemiparesis, dysphagia, CKD   Presented to [facility] ED on 10/13/14 with an episode of hypoglycemia and altered mental status. Patient unable to provide significant history secondary to altered mental status and possible dementia. She is alert and oriented partly to place and states that "I am fine" and denies complaints at this time. As per patient's daughter at bedside, patient was in her usual state of health until this morning. Fasting blood sugar was 103 mg per DL. Due to 3 visitors coming home this morning, family could not provide patient her morning breakfast. She however got her 70/25 insulin of 20 units at approximately 8 AM. At approximately 11 AM, daughter noticed that patient was cold and clammy and checked her CBG which was 30 mg per DL. Patient was sitting on chair and daughter started to feed her but she wouldn't take anything in. She attempted to give her some orange juice but patient was unable to swallow. She was drowsy but arousable and as per daughter, did not demonstrate any new stroke signs. As per EMS, CBG was 28 and found.reporting OJ in patient's mouth while patient still had food in the mouth. She was resuscitated with IV D 50 followed by IV D10. On arrival to the ED, patient was noted to be hypothermic, hypotensive. Lab work significant for sodium 146, CT head without acute abnormality and chest x-ray without acute abnormality. Sepsis protocol was initiated and patient was bolused with IV fluids and started on broad-spectrum IV antibiotics with the suspicion of aspiration pneumonitis/pneumonia. As per daughter, patient has  nearly returned to her baseline. Hospitalist admission was requested.   HPI/Subjective: 8/14 A/O 1, patient (does not know where, when, why), very calm, states ate a banana this morning.   Assessment/Plan: Type II DM/IDDM uncontrolled -6/10 hemoglobin A1c= 7.7 -Most likely secondary to insulin in the absence of a.m. Breakfast. -Patient eating very little secondary to extreme dislike of dysphagia 1 diet. At this may be more of a mental health benefit to allow patient to eat before choosing and except the risks associated with aspiration. This will need to be discussed with her family members - Encourage patient to eat dysphagia 1 diet, patient refuses.  -Place on sensitive SSI; would not try to control patient's CBGs closely as multiple studies have shown increased morbidity in older patients with tighter control. -Spoke with daughter last night and daughter was able to assist patient in eating her dinner. Informed daughter that if patient had food she ate at home which could be brought here we would feed patient that food.   Sepsis: Unspecified organism/CAP? -On admission Hypothermic, Hypotensive  -PCXR 8/13; consolidation LLL concerning for pneumonia -Change to PO CAP ABx to finish out a seven-day course  CVA old hemiparesis -All strokes old, no new strokes found see MRI/CT scan below -Lt hemiparesis and mild dysphagia -PT/OT pending   Hypothermia:  -May be related to sepsis or hypoglycemia. -TSH WNL  Dehydration with mild hypernatremia -Resolved with hydration  Essential Hypertension/Hypotension:  -Continue to hold antihypertensives, patient hypotensive for a 81 year old.  Prolonged QT interval - Avoid precipitating medications.. -EKG 10/14 shows continued QT prolongation  Chronic diastolic  CHF:  -Compensated. -Strict in and out+ 5.9 L -Daily weight  Severe LVH -Compensated  Pulmonary hypertension -Compensated  Dementia: -Patient at Baseline per  daughter -Daughter to bring in food that patient enjoys eating, will store some meals in our refrigerator.   Stage III chronic kidney disease:  -Follow closely      Code Status: FULL Family Communication: no family present at time of exam Disposition Plan: Discharge next 24-48 hrs   Consultants: NA  Procedure/Significant Events: 05/31/2013 echocardiogram - Left ventricle: severe focal basal and moderate concentric hypertrophy the septum. LVEF= 60% to 65%.  -(grade 1 diastolic dysfunction).  -Tricuspid valve: Moderate regurgitation. - Pulmonary arteries: PA peak pressure: 41mm Hg (S). 8/10 PCXR; enlarged heart. 8/10 CT head without contrast;-generalized cerebral atrophy. -Old left cerebellar infarct. 8/11 MRI brain without contrast;-Evolution of the right corona radiata and external capsule lacunar infarct seen in 2015.  -Superimposed chronic small vessel infarcts bilateral cerebellar hemispheres, the pons, and both thalami. -Suggestion of chronic blood products near the left frontal horn, 8/13 PCXR;Consolidation LLL concerning PNA -Possible small left pleural effusion.- Moderate cardiomegaly with pulmonary venous congestion,    Culture 8/10 blood left AC/RIGHT HAND NGTD 8/10 urine NGTD 8/10 MRSA by PCR negative 8/11 strep pneumo urine antigen pending 8/11 Legionella urine antigen pending   Antibiotics: Zosyn 8/10>> stopped 8/14 Vancomycin 8/10>> stopped 8/14 Ceftin 8/14>> Azithromycin 8/14>>   DVT prophylaxis: Lovenox   Devices    LINES / TUBES:      Continuous Infusions:    Objective: VITAL SIGNS: Temp: 98.2 F (36.8 C) (08/14 1203) Temp Source: Oral (08/14 1203) BP: 162/79 mmHg (08/14 1500) Pulse Rate: 58 (08/14 1500) SPO2; FIO2:   Intake/Output Summary (Last 24 hours) at 10/17/14 2003 Last data filed at 10/17/14 1616  Gross per 24 hour  Intake    851 ml  Output      0 ml  Net    851 ml     Exam: General: A/O 1, patient (does not  know where, when, why), patient is very alert and fixated on eating food that she enjoys. No acute respiratory distress Eyes: Negative headache, eye pain, double vision,negative scleral hemorrhage ENT: Negative Runny nose, negative ear pain, negative tinnitus, negative gingival bleeding Neck:  Negative scars, masses, torticollis, lymphadenopathy, JVD Lungs: Clear to auscultation bilaterally without wheezes or crackles Cardiovascular: Regular rate and rhythm without murmur gallop or rub normal S1 and S2 Abdomen:negative abdominal pain, negative dysphagia, nondistended, positive soft, bowel sounds, no rebound, no ascites, no appreciable mass Extremities: No significant cyanosis, clubbing, or edema bilateral lower extremities Psychiatric:  Negative depression, negative anxiety, negative fatigue, negative mania  Neurologic:  Cranial nerves II through XII intact, tongue/uvula midline, left hemi-paresis LUE/LLE strength 1-2/5 sensation intact. Right side extremities muscle strength 5/5, sensation intact,  negative dysarthria, negative expressive aphasia, negative receptive aphasia.      Data Reviewed: Basic Metabolic Panel:  Recent Labs Lab 10/13/14 1438 10/13/14 1830 10/14/14 0419 10/15/14 0450 10/16/14 0257 10/17/14 0336  NA 146*  --  145 140 140 139  K 3.9  --  3.6 3.7 3.3* 3.4*  CL 107  --  106 103 102 98*  CO2 31  --  32 30 35* 35*  GLUCOSE 102*  --  69 167* 167* 164*  BUN 24*  --  18 13 7  <5*  CREATININE 1.18*  --  1.03* 1.00 0.85 0.78  CALCIUM 8.6*  --  8.2* 8.1* 8.4* 8.3*  MG  --  2.2  --  1.9 1.8 1.7   Liver Function Tests:  Recent Labs Lab 10/13/14 1438 10/15/14 0450 10/16/14 0257 10/17/14 0336  AST ALT 9* 9* 11* 11*  ALKPHOS 61 60 65 58  BILITOT 0.4 0.4 0.3 0.5  PROT 6.7 5.4* 6.4* 6.4*  ALBUMIN 2.5* 2.1* 2.3* 2.3*   No results for input(s): LIPASE, AMYLASE in the last 168 hours. No results for input(s): AMMONIA in the last 168  hours. CBC:  Recent Labs Lab 10/13/14 1438 10/14/14 0419 10/15/14 0450 10/16/14 0257 10/17/14 0336  WBC 6.5 5.4 6.5 5.9 6.2  NEUTROABS 5.0  --  4.1 3.4 3.8  HGB 12.0 10.4* 9.9* 10.6* 10.4*  HCT 39.5 33.9* 32.2* 34.3* 33.6*  MCV 92.5 92.4 90.7 90.5 91.3  PLT 214 137* 207 210 217   Cardiac Enzymes:  Recent Labs Lab 10/14/14 2335 10/15/14 0450 10/15/14 0958  TROPONINI <0.03 <0.03 <0.03   BNP (last 3 results)  Recent Labs  05/18/14 2056 08/13/14 1230  BNP 83.8 158.9*    ProBNP (last 3 results) No results for input(s): PROBNP in the last 8760 hours.  CBG:  Recent Labs Lab 10/16/14 1900 10/16/14 2019 10/17/14 0800 10/17/14 1137 10/17/14 1637  GLUCAP 180* 173* 119* 139* 161*    Recent Results (from the past 240 hour(s))  Blood Culture (routine x 2)     Status: None (Preliminary result)   Collection Time: 10/13/14  2:00 PM  Result Value Ref Range Status   Specimen Description BLOOD LEFT ANTECUBITAL  Final   Special Requests BOTTLES DRAWN AEROBIC ONLY  Final   Culture NO GROWTH 4 DAYS  Final   Report Status PENDING  Incomplete  Blood Culture (routine x 2)     Status: None (Preliminary result)   Collection Time: 10/13/14  2:15 PM  Result Value Ref Range Status   Specimen Description BLOOD RIGHT HAND  Final   Special Requests BOTTLES DRAWN AEROBIC ONLY  Final   Culture NO GROWTH 4 DAYS  Final   Report Status PENDING  Incomplete  Urine culture     Status: None   Collection Time: 10/13/14  3:41 PM  Result Value Ref Range Status   Specimen Description URINE, CATHETERIZED  Final   Special Requests NONE  Final   Culture NO GROWTH 2 DAYS  Final   Report Status 10/15/2014 FINAL  Final  MRSA PCR Screening     Status: None   Collection Time: 10/13/14  6:51 PM  Result Value Ref Range Status   MRSA by PCR NEGATIVE NEGATIVE Final    Comment:        The GeneXpert MRSA Assay (FDA approved for NASAL specimens only), is one component of a comprehensive  MRSA colonization surveillance program. It is not intended to diagnose MRSA infection nor to guide or monitor treatment for MRSA infections.      Studies:  Recent x-ray studies have been reviewed in detail by the Attending Physician  Scheduled Meds:  Scheduled Meds: . allopurinol  100 mg Oral BID  . antiseptic oral rinse  7 mL Mouth Rinse BID  . aspirin EC  81 mg Oral Daily  . atorvastatin  20 mg Oral Daily  . azithromycin  250 mg Oral Daily  . cefUROXime  250 mg Oral BID WC  . donepezil  10 mg Oral QHS  . enoxaparin (LOVENOX) injection  30 mg Subcutaneous Q24H  . gabapentin  300 mg Oral TID  . insulin aspart  0-9 Units  Subcutaneous TID WC  . loratadine  10 mg Oral Daily  . psyllium  1 packet Oral Daily  . risperiDONE  0.25 mg Oral QHS  . sertraline  50 mg Oral Daily  . sodium chloride  3 mL Intravenous Q12H    Time spent on care of this patient: 40 mins   WOODS, Roselind Messier , MD  [facility] Office  [phone] Pager [phone]  On-Call/Text Page:      [email or web address]      password [email or web address]  If 7PM-7AM, please contact night-coverage [email or web address] Password [email or web address] 10/17/2014, 8:03 PM   LOS: 4 days   Care during the described time interval was provided by me .  I have reviewed this patient's available data, including medical history, events of note, physical examination, and all test results as part of my evaluation. I have personally reviewed and interpreted all radiology studies.   Carolyne Littles, MD [phone] Pager

## 2014-10-17 NOTE — Progress Notes (Addendum)
[name], [name]- [place]-2C12-[facility] visit, W/E Social Work Liaison Visit  This is a related admission to her [facility] diagnosis of Alzheimer's Disease per Dr. Jamie Brookes. Patient is a Full Code.   Patient seen in room sitting up enjoying meal prepared by her daughter who is at bedside. Patient very focused on eating, frustrated by blood pressure cuff. Daughter reports she anticipates speaking w/bedside RN and MD later this afternoon. Daughter confirms plan is to return home when MD feels ready for discharge. Daughter confirms she has all equipment and medication needed for patient to return home. Reminded daughter to let [facility] know if patient returns home this evening. Per RN meds changed to PO today and no MD order for discharge at this time.   Updated [facility] social worker [place] on patient status.   Transfer summary and medication list remains on chart.   [facility] will continue to follow.  Please call with questions.   Forrestine Him, LCSW  [facility] Social Work Liaison [phone]

## 2014-10-18 DIAGNOSIS — T68XXXD Hypothermia, subsequent encounter: Secondary | ICD-10-CM

## 2014-10-18 DIAGNOSIS — R1310 Dysphagia, unspecified: Secondary | ICD-10-CM

## 2014-10-18 LAB — COMPREHENSIVE METABOLIC PANEL
ALK PHOS: 61 U/L (ref 38–126)
ALT: 10 U/L — AB (ref 14–54)
ANION GAP: 7 (ref 5–15)
AST: 16 U/L (ref 15–41)
Albumin: 2.4 g/dL — ABNORMAL LOW (ref 3.5–5.0)
BILIRUBIN TOTAL: 0.1 mg/dL — AB (ref 0.3–1.2)
BUN: <5 mg/dL — ABNORMAL LOW (ref 6–20)
CALCIUM: 8.6 mg/dL — AB (ref 8.9–10.3)
CO2: 38 mmol/L — AB (ref 22–32)
CREATININE: 0.73 mg/dL (ref 0.44–1.00)
Chloride: 96 mmol/L — ABNORMAL LOW (ref 101–111)
GFR calc non Af Amer: >60 mL/min (ref >60)
Glucose, Bld: 169 mg/dL — ABNORMAL HIGH (ref 65–99)
Potassium: 3.2 mmol/L — ABNORMAL LOW (ref 3.5–5.1)
Sodium: 141 mmol/L (ref 135–145)
TOTAL PROTEIN: 6.4 g/dL — AB (ref 6.5–8.1)

## 2014-10-18 LAB — GLUCOSE, CAPILLARY
GLUCOSE-CAPILLARY: 117 mg/dL — AB (ref 65–99)
GLUCOSE-CAPILLARY: 136 mg/dL — AB (ref 65–99)

## 2014-10-18 LAB — CBC WITH DIFFERENTIAL/PLATELET
BASOS ABS: 0 10*3/uL (ref 0.0–0.1)
BASOS PCT: 0 % (ref 0–1)
EOS ABS: 0.3 10*3/uL (ref 0.0–0.7)
Eosinophils Relative: 5 % (ref 0–5)
HEMATOCRIT: 36.3 % (ref 36.0–46.0)
HEMOGLOBIN: 11.1 g/dL — AB (ref 12.0–15.0)
Lymphocytes Relative: 22 % (ref 12–46)
Lymphs Abs: 1.6 10*3/uL (ref 0.7–4.0)
MCH: 27.9 pg (ref 26.0–34.0)
MCHC: 30.6 g/dL (ref 30.0–36.0)
MCV: 91.2 fL (ref 78.0–100.0)
MONO ABS: 0.5 10*3/uL (ref 0.1–1.0)
MONOS PCT: 6 % (ref 3–12)
NEUTROS ABS: 5 10*3/uL (ref 1.7–7.7)
NEUTROS PCT: 67 % (ref 43–77)
Platelets: 226 10*3/uL (ref 150–400)
RBC: 3.98 MIL/uL (ref 3.87–5.11)
RDW: 15.4 % (ref 11.5–15.5)
WBC: 7.4 10*3/uL (ref 4.0–10.5)

## 2014-10-18 LAB — CULTURE, BLOOD (ROUTINE X 2)
CULTURE: NO GROWTH
Culture: NO GROWTH

## 2014-10-18 LAB — MAGNESIUM: MAGNESIUM: 1.6 mg/dL — AB (ref 1.7–2.4)

## 2014-10-18 MED ORDER — INSULIN LISPRO PROT & LISPRO (75-25 MIX) 100 UNIT/ML ~~LOC~~ SUSP: SUBCUTANEOUS | Status: DC

## 2014-10-18 MED ORDER — POTASSIUM CHLORIDE CRYS ER 20 MEQ PO TBCR: 40.0000 meq | EXTENDED_RELEASE_TABLET | Freq: Once | ORAL | Status: DC

## 2014-10-18 MED ORDER — MAGNESIUM SULFATE 2 GM/50ML IV SOLN: 2.0000 g | Freq: Once | INTRAVENOUS | Status: DC

## 2014-10-18 NOTE — Care Management Note (Signed)
Case Management Note  Patient Details  Name: [name] MRN: [id number] Date of Birth: 1918/02/24  Subjective/Objective:             Adm w sepsis       Action/Plan:lives w da, act w [facility] [place]   Expected Discharge Date:  10/18/14               Expected Discharge Plan:  Home w Hospice Care  In-House Referral:     Discharge planning Services  CM Consult  Post Acute Care Choice:  Resumption of Svcs/PTA Provider Choice offered to:     DME Arranged:    DME Agency:     HH Arranged:  RN HH Agency:  [facility]  Status of Service:     Medicare Important Message Given:    Date Medicare IM Given:    Medicare IM give by:    Date Additional Medicare IM Given:    Additional Medicare Important Message give by:     If discussed at Long Length of Stay Meetings, dates discussed:    Additional Comments:called [facility] [place] to alert them of disch. Have faxed them dc summary.  Hanley Hays, RN 10/18/2014, 2:44 PM

## 2014-10-18 NOTE — Progress Notes (Signed)
Home Care SW made visit this morning with Patient.  She was sitting up in her chair and as soon as SW walked in she stated "I want to go home."  SW reviewed chart and talked to staff as Patient was requesting to get back in bed from sitting up.  SW did talk to Daughter by phone this morning and she stated she was waiting to hear if Patient was going to be coming home today.  Plan is for Patient to return home at time of discharge and SW will follow up when she returns home.  Maretta Los, LCSW [facility] [phone]

## 2014-10-18 NOTE — Discharge Instructions (Signed)
Please note, we have slightly lowered your home dose of insulin.  Our main focus should be to avoid extremes in your blood sugar (CBG).  A CBG of 70-250 is acceptable for you.  If your CBG is > 250 for more than 3 consecutive checks, >400 at any time, or < 70 for 2 consecutive checks, please consult with your primary care MD.   Hypoglycemia  Hypoglycemia occurs when the glucose in your blood is too low. Glucose is a type of sugar that is your body's main energy source. Hormones, such as insulin and glucagon, control the level of glucose in the blood. Insulin lowers blood glucose and glucagon increases blood glucose. Having too much insulin in your blood stream, or not eating enough food containing sugar, can result in hypoglycemia. Hypoglycemia can happen to people with or without diabetes. It can develop quickly and can be a medical emergency.  CAUSES   Missing or delaying meals.  Not eating enough carbohydrates at meals.  Taking too much diabetes medicine.  Not timing your oral diabetes medicine or insulin doses with meals, snacks, and exercise.  Nausea and vomiting.  Certain medicines.  Severe illnesses, such as hepatitis, kidney disorders, and certain eating disorders.  Increased activity or exercise without eating something extra or adjusting medicines.  Drinking too much alcohol.  A nerve disorder that affects body functions like your heart rate, blood pressure, and digestion (autonomic neuropathy).  A condition where the stomach muscles do not function properly (gastroparesis). Therefore, medicines and food may not absorb properly.  Rarely, a tumor of the pancreas can produce too much insulin. SYMPTOMS   Hunger.  Sweating (diaphoresis).  Change in body temperature.  Shakiness.  Headache.  Anxiety.  Lightheadedness.  Irritability.  Difficulty concentrating.  Dry mouth.  Tingling or numbness in the hands or feet.  Restless sleep or sleep  disturbances.  Altered speech and coordination.  Change in mental status.  Seizures or prolonged convulsions.  Combativeness.  Drowsiness (lethargic).  Weakness.  Increased heart rate or palpitations.  Confusion.  Pale, gray skin color.  Blurred or double vision.  Fainting. DIAGNOSIS  A physical exam and medical history will be performed. Your caregiver may make a diagnosis based on your symptoms. Blood tests and other lab tests may be performed to confirm a diagnosis. Once the diagnosis is made, your caregiver will see if your signs and symptoms go away once your blood glucose is raised.  TREATMENT  Usually, you can easily treat your hypoglycemia when you notice symptoms.  Check your blood glucose. If it is less than 70 mg/dl, take one of the following:   3-4 glucose tablets.    cup juice.    cup regular soda.   1 cup skim milk.   -1 tube of glucose gel.   5-6 hard candies.   Avoid high-fat drinks or food that may delay a rise in blood glucose levels.  Do not take more than the recommended amount of sugary foods, drinks, gel, or tablets. Doing so will cause your blood glucose to go too high.   Wait 10-15 minutes and recheck your blood glucose. If it is still less than 70 mg/dl or below your target range, repeat treatment.   Eat a snack if it is more than 1 hour until your next meal.  There may be a time when your blood glucose may go so low that you are unable to treat yourself at home when you start to notice symptoms. You may need someone  to help you. You may even faint or be unable to swallow. If you cannot treat yourself, someone will need to bring you to the hospital.  HOME CARE INSTRUCTIONS  If you have diabetes, follow your diabetes management plan by:  Taking your medicines as directed.  Following your exercise plan.  Following your meal plan. Do not skip meals. Eat on time.  Testing your blood glucose regularly. Check your blood  glucose before and after exercise. If you exercise longer or different than usual, be sure to check blood glucose more frequently.  Wearing your medical alert jewelry that says you have diabetes.  Identify the cause of your hypoglycemia. Then, develop ways to prevent the recurrence of hypoglycemia.  Do not take a hot bath or shower right after an insulin shot.  Always carry treatment with you. Glucose tablets are the easiest to carry.  If you are going to drink alcohol, drink it only with meals.  Tell friends or family members ways to keep you safe during a seizure. This may include removing hard or sharp objects from the area or turning you on your side.  Maintain a healthy weight. SEEK MEDICAL CARE IF:   You are having problems keeping your blood glucose in your target range.  You are having frequent episodes of hypoglycemia.  You feel you might be having side effects from your medicines.  You are not sure why your blood glucose is dropping so low.  You notice a change in vision or a new problem with your vision. SEEK IMMEDIATE MEDICAL CARE IF:   Confusion develops.  A change in mental status occurs.  The inability to swallow develops.  Fainting occurs. Document Released: 02/19/2005 Document Revised: 02/24/2013 Document Reviewed: 06/18/2011 [facility] Patient Information 2015 [place], [place]. This information is not intended to replace advice given to you by your health care provider. Make sure you discuss any questions you have with your health care provider.

## 2014-10-18 NOTE — Progress Notes (Signed)
Physical Therapy Treatment Patient Details Name: [name] MRN: [id number] DOB: 05/08/1917 Today's Date: 10/18/2014    History of Present Illness 81 y.o. female , widowed, lives with daughter with 24/7 supervision/assistance, nonambulatory for the last 8 months at least and wheelchair mobile, PMH of DM 2/IDDM, HTN, HLD, gout, CVA with residual left hemiparesis, dysphagia, chronic kidney disease presented to [facility] ED on 10/13/14 with an episode of hypoglycemia and altered mental status. On arrival to the ED, CT head negative for acute event.  Per daughter pt has nearly returned to baseline    PT Comments    Pt confused regarding situation and believes that all mobility is trying to make her fall despite education, cues and assist. Pt continues to require max -total assist for mobility and will continue to follow acutely. Pt educated for transfer technique, HEP and plan.   Follow Up Recommendations  SNF;Supervision/Assistance - 24 hour     Equipment Recommendations       Recommendations for Other Services       Precautions / Restrictions Precautions Precautions: Fall Restrictions Weight Bearing Restrictions: No    Mobility  Bed Mobility Overal bed mobility: Needs Assistance Bed Mobility: Rolling;Sidelying to Sit Rolling: Mod assist Sidelying to sit: Max assist       General bed mobility comments: cues for sequence with assist to rotate pelvis, cues to bend knees and assist to move legs off of bed and elevate trunk  Transfers Overall transfer level: Needs assistance   Transfers: Sit to/from Stand;Squat Pivot Transfers Sit to Stand: Total assist   Squat pivot transfers: Max assist     General transfer comment: pt with bil feet and knees blocked with assist for anterior translation and elevation able to rise from surface x 2 but maintained squat position and unable to achieve fully upright. With cues and max assist able to transfers with over the back assist of squat  pivot to pts right.   Ambulation/Gait                 Stairs            Wheelchair Mobility    Modified Rankin (Stroke Patients Only) Modified Rankin (Stroke Patients Only) Pre-Morbid Rankin Score: Severe disability Modified Rankin: Severe disability     Balance     Sitting balance-Leahy Scale: Poor       Standing balance-Leahy Scale: Zero                      Cognition Arousal/Alertness: Awake/alert Behavior During Therapy: WFL for tasks assessed/performed   Area of Impairment: Orientation;Memory;Safety/judgement;Awareness;Problem solving Orientation Level: Disoriented to;Situation   Memory: Decreased short-term memory Following Commands: Follows one step commands inconsistently;Follows one step commands with increased time Safety/Judgement: Decreased awareness of safety;Decreased awareness of deficits   Problem Solving: Slow processing;Decreased initiation;Difficulty sequencing;Requires verbal cues;Requires tactile cues General Comments: pt continues to be unaware of why she is in hospital and states all mobility is trying to make her fall. continues to state she walks daily    Exercises General Exercises - Lower Extremity Long Arc Quad: AROM;Seated;Both;10 reps Hip Flexion/Marching: AROM;Seated;Both;10 reps    General Comments        Pertinent Vitals/Pain Pain Assessment: No/denies pain  HR 67 sats 94% on 1L end of session, 94% on RA throughout transfers but dropping to 88% end of session on RA    Home Living  Prior Function            PT Goals (current goals can now be found in the care plan section) Progress towards PT goals: Progressing toward goals    Frequency  Min 2X/week    PT Plan Current plan remains appropriate;Frequency needs to be updated    Co-evaluation             End of Session Equipment Utilized During Treatment: Gait belt Activity Tolerance: Patient tolerated treatment  well Patient left: in chair;with call bell/phone within reach;with chair alarm set     Time: 1610-9604 PT Time Calculation (min) (ACUTE ONLY): 26 min  Charges:  $Therapeutic Exercise: 8-22 mins $Therapeutic Activity: 8-22 mins                    G Codes:      Delorse Lek 2014-11-06, 10:59 AM Delaney Meigs, PT [phone]

## 2014-10-18 NOTE — Discharge Summary (Signed)
DISCHARGE SUMMARY  [name]  MR#: [id number]  DOB:10/14/1917  Date of Admission: 10/13/2014 Date of Discharge: 10/18/2014  Attending Physician:Daniyah Fohl T  Patient's JYN:WGNFAOZ,HYQMV A, MD  Consults:  none  Disposition: D/C home w/ daughter   Follow-up Appts:     Follow-up Information    Follow up with AVBUERE,EDWIN A, MD. Schedule an appointment as soon as possible for a visit in 1 week.   Specialty:  Internal Medicine   Contact information:   [address] [place] [address] [phone]      Tests Needing Follow-up: -assessment of CBG log -assessment of BP trend  -assessment of volume status  -recheck K+ and Mg+   Discharge Diagnoses: Type II DM/IDDM uncontrolled w/ severe hypoglycemia  SIRS v/s Sepsis - LLL pneumonitis  Hx of CVA  Dehydration with mild hypernatremia Essential Hypertension/Hypotension Prolonged QT interval Chronic diastolic CHF  Pulmonary hypertension Dementia Stage III chronic kidney disease  Initial presentation: 81 y.o. F who lives with daughter with 24/7 supervision/assistance, nonambulatory for the last 8 months at least and wheelchair mobile w/ a HxDM2, HTN, HLD, Gout, CVA with residual left hemiparesis, dysphagia, and CKD who presented to [facility] ED on 10/13/14 with an episode of hypoglycemia and altered mental status. Patient was in her usual state of health until the morning of her admit. Fasting blood sugar on waking was 103. Due to visitors the family could not provide patient her morning breakfast. She however got her insulin of 20 units at 8 AM. At 11 AM, daughter noticed that patient was cold and clammy and checked her CBG which was 30. She attempted to give her some orange juice but patient was unable to swallow. As per EMS, CBG was 28. She was resuscitated with IV D50 followed by IV D10.   On arrival to the ED the patient was noted to be hypothermic and hypotensive. Lab work was significant for sodium 146, CT  head without acute abnormality and chest x-ray without acute abnormality. Sepsis protocol was initiated and patient was bolused with IV fluids and started on broad-spectrum IV antibiotics with the suspicion of aspiration pneumonitis/pneumonia. As per daughter, patient had nearly returned to her baseline by the time she was seen in the ED.   Hospital Course:  Type II DM/IDDM uncontrolled w/ severe hypoglycemia  -6/10 A1c 7.7 -severe hypoglycemia secondary to insulin in the absence of breakfast - this was the initial reason for hospitalization -Patient eating very little secondary to extreme dislike of dysphagia 1 diet -Placed on sensitive SSI during hospital stay; would not try to control patient's CBGs closely as studies have shown increased morbidity in older patients with tighter control - have lowered home insulin dose  SIRS v/s Sepsis - LLL pneumonitis  -On admission Hypothermic, Hypotensive likely due to hypoglycemia and not a true infection  -CXR negative initially, but f/u CXR 8/13 noted consolidation LLL  -suspect pt aspirated during her hypoglycemia episode - has completed 5 days of empiric abx tx - pt is afebrile/normothermic with a normal WBC  Hx of CVA -no new strokes found on MRI/CT -Lt hemiparesis and mild dysphagia -has 24hr assistance/support at home w/ daughter   Dehydration with mild hypernatremia -Resolved with hydration  Essential Hypertension/Hypotension:  -Continue to hold antihypertensives and allow for higher baseline BP in this 81 year old  Prolonged QT interval -Avoid precipitating medications  Chronic diastolic CHF  -ntoed via TTE March 2015 - compensated clinically - avoid schedule diuretics due to advanced age and poor intake  Pulmonary hypertension -  Compensated  Dementia -appears to have returned to her baseline mental status   Stage III chronic kidney disease:  -crt stable     Medication List    STOP taking these medications         amLODipine 10 MG tablet  Commonly known as:  NORVASC     amoxicillin-clavulanate 500-125 MG per tablet  Commonly known as:  AUGMENTIN      TAKE these medications        acetaminophen 325 MG tablet  Commonly known as:  TYLENOL  Take 2 tablets (650 mg total) by mouth every 6 (six) hours as needed for mild pain (or Fever >/= 101).     albuterol 108 (90 BASE) MCG/ACT inhaler  Commonly known as:  PROVENTIL HFA;VENTOLIN HFA  Inhale 2 puffs into the lungs every 6 (six) hours as needed for wheezing or shortness of breath.     allopurinol 100 MG tablet  Commonly known as:  ZYLOPRIM  Take 100 mg by mouth 2 (two) times daily.     aspirin 81 MG EC tablet  Take 1 tablet (81 mg total) by mouth daily.     atorvastatin 20 MG tablet  Commonly known as:  LIPITOR  TAKE 1 TABLET BY MOUTH EVERY DAY     BENEFIBER Chew  Chew 1 tablet by mouth daily.     cetirizine 10 MG tablet  Commonly known as:  ZYRTEC  Take 10 mg by mouth daily.     donepezil 10 MG tablet  Commonly known as:  ARICEPT  Take 10 mg by mouth at bedtime.     furosemide 20 MG tablet  Commonly known as:  LASIX  Take 20 mg by mouth daily as needed for fluid.     gabapentin 300 MG capsule  Commonly known as:  NEURONTIN  Take 300 mg by mouth 3 (three) times daily.     insulin aspart 100 UNIT/ML injection  Commonly known as:  novoLOG  Inject 0-1 Units into the skin 3 (three) times daily before meals. Patient uses sliding scale     insulin lispro protamine-lispro (75-25) 100 UNIT/ML Susp injection  Commonly known as:  HUMALOG 75/25 MIX  15 units in am and 5 units in pm     multivitamin with minerals Tabs tablet  Take 1 tablet by mouth daily.     RESOURCE THICKENUP CLEAR Powd  Take 1 g by mouth as needed (Per diet order.).     risperiDONE 0.5 MG tablet  Commonly known as:  RISPERDAL  Take 0.5 mg by mouth at bedtime. For hallucinations     sertraline 50 MG tablet  Commonly known as:  ZOLOFT  Take 50 mg by mouth  daily.       Day of Discharge BP 159/76 mmHg  Pulse 65  Temp(Src) 98.3 F (36.8 C) (Oral)  Resp 16  Wt 64 kg (141 lb 1.5 oz)  SpO2 100%  Physical Exam: General: No acute respiratory distress Lungs: Clear to auscultation bilaterally without wheezes or crackles Cardiovascular: Regular rate and rhythm without murmur gallop or rub normal S1 and S2 Abdomen: Nontender, nondistended, soft, bowel sounds positive, no rebound, no ascites, no appreciable mass Extremities: No significant cyanosis, clubbing, or edema bilateral lower extremities  Basic Metabolic Panel:  Recent Labs Lab 10/13/14 1830 10/14/14 0419 10/15/14 0450 10/16/14 0257 10/17/14 0336 10/18/14 0301  NA  --  145 140 140 139 141  K  --  3.6 3.7 3.3* 3.4* 3.2*  CL  --  106 103 102 98* 96*  CO2  --  32 30 35* 35* 38*  GLUCOSE  --  69 167* 167* 164* 169*  BUN  --  18 13 7  <5* <5*  CREATININE  --  1.03* 1.00 0.85 0.78 0.73  CALCIUM  --  8.2* 8.1* 8.4* 8.3* 8.6*  MG 2.2  --  1.9 1.8 1.7 1.6*    Liver Function Tests:  Recent Labs Lab 10/13/14 1438 10/15/14 0450 10/16/14 0257 10/17/14 0336 10/18/14 0301  AST 22 18 18 18 16   ALT 9* 9* 11* 11* 10*  ALKPHOS 61 60 65 58 61  BILITOT 0.4 0.4 0.3 0.5 0.1*  PROT 6.7 5.4* 6.4* 6.4* 6.4*  ALBUMIN 2.5* 2.1* 2.3* 2.3* 2.4*    CBC:  Recent Labs Lab 10/13/14 1438 10/14/14 0419 10/15/14 0450 10/16/14 0257 10/17/14 0336 10/18/14 0301  WBC 6.5 5.4 6.5 5.9 6.2 7.4  NEUTROABS 5.0  --  4.1 3.4 3.8 5.0  HGB 12.0 10.4* 9.9* 10.6* 10.4* 11.1*  HCT 39.5 33.9* 32.2* 34.3* 33.6* 36.3  MCV 92.5 92.4 90.7 90.5 91.3 91.2  PLT 214 137* 207 210 217 226    Cardiac Enzymes:  Recent Labs Lab 10/14/14 2335 10/15/14 0450 10/15/14 0958  TROPONINI <0.03 <0.03 <0.03   CBG:  Recent Labs Lab 10/17/14 0800 10/17/14 1137 10/17/14 1637 10/17/14 2029 10/18/14 0749  GLUCAP 119* 139* 161* 248* 117*    Recent Results (from the past 240 hour(s))  Blood Culture (routine x  2)     Status: None (Preliminary result)   Collection Time: 10/13/14  2:00 PM  Result Value Ref Range Status   Specimen Description BLOOD LEFT ANTECUBITAL  Final   Special Requests BOTTLES DRAWN AEROBIC ONLY  Final   Culture NO GROWTH 4 DAYS  Final   Report Status PENDING  Incomplete  Blood Culture (routine x 2)     Status: None (Preliminary result)   Collection Time: 10/13/14  2:15 PM  Result Value Ref Range Status   Specimen Description BLOOD RIGHT HAND  Final   Special Requests BOTTLES DRAWN AEROBIC ONLY  Final   Culture NO GROWTH 4 DAYS  Final   Report Status PENDING  Incomplete  Urine culture     Status: None   Collection Time: 10/13/14  3:41 PM  Result Value Ref Range Status   Specimen Description URINE, CATHETERIZED  Final   Special Requests NONE  Final   Culture NO GROWTH 2 DAYS  Final   Report Status 10/15/2014 FINAL  Final  MRSA PCR Screening     Status: None   Collection Time: 10/13/14  6:51 PM  Result Value Ref Range Status   MRSA by PCR NEGATIVE NEGATIVE Final    Comment:        The GeneXpert MRSA Assay (FDA approved for NASAL specimens only), is one component of a comprehensive MRSA colonization surveillance program. It is not intended to diagnose MRSA infection nor to guide or monitor treatment for MRSA infections.     Time spent in discharge (includes decision making & examination of pt): > 30 minutes  10/18/2014, 10:29 AM   Lonia Blood, MD [facility] Office  [phone] Pager [phone]  On-Call/Text Page:      [email or web address]      password [facility]

## 2014-10-18 NOTE — Progress Notes (Signed)
[name], [name] Visit-[name] RN  This is a related admission to her [facility] diagnosis of Alzheimer's Disease per Dr. Jamie Brookes. Patient is a Full Code. Per [facility] chart review, multiple discussions have occurred regarding code status and upon transfer to [facility] ED, Full Code still reflects patient's wishes. Patient transported to [facility] ED via EMS after a reported blood sugar of 30. She received D50 in transport. Upon arrival to ED, she was hypothermic and bradycardic. Clinical call center notified after EMS arrived in the home.  Patient sitting up in a chair when writer entered room. Pt. was asking to call her daughter and for a blanket. Another blanket was brought to patient and her shoulders were covered. Patient's IV has been d/c'd and the MD has written for discharge orders to go home today. Pt. Is alert and responds to questions appropriately. Her speech remains difficult to understand. Her abx have been changed to PO. She is now on Zithromax 250 mg PO daily and Ceftin 250 mg PO Bid. Her oxygen has been stopped. Pt's respirations are regular and unlabored. [facility] RN will be out to to f/u with [name] at her home tomorrow.  [facility] transfer summary and medication list remain on shadow chart.  Pease call with any questions.  Sharen Heck RN [facility] Liaison [phone]

## 2014-11-11 ENCOUNTER — Other Ambulatory Visit: Payer: Self-pay | Admitting: Oncology

## 2014-11-28 ENCOUNTER — Inpatient Hospital Stay (HOSPITAL_COMMUNITY)
Admission: EM | Admit: 2014-11-28 | Discharge: 2014-12-02 | DRG: 377 | Disposition: A | Discharge status: Hospice | Attending: Family Medicine | Admitting: Family Medicine

## 2014-11-28 ENCOUNTER — Encounter (HOSPITAL_COMMUNITY): Payer: Self-pay

## 2014-11-28 DIAGNOSIS — J810 Acute pulmonary edema: Secondary | ICD-10-CM | POA: Diagnosis not present

## 2014-11-28 DIAGNOSIS — M1990 Unspecified osteoarthritis, unspecified site: Secondary | ICD-10-CM | POA: Diagnosis present

## 2014-11-28 DIAGNOSIS — R1310 Dysphagia, unspecified: Secondary | ICD-10-CM | POA: Diagnosis present

## 2014-11-28 DIAGNOSIS — Z87440 Personal history of urinary (tract) infections: Secondary | ICD-10-CM

## 2014-11-28 DIAGNOSIS — E8770 Fluid overload, unspecified: Secondary | ICD-10-CM

## 2014-11-28 DIAGNOSIS — E1165 Type 2 diabetes mellitus with hyperglycemia: Secondary | ICD-10-CM | POA: Diagnosis present

## 2014-11-28 DIAGNOSIS — I5032 Chronic diastolic (congestive) heart failure: Secondary | ICD-10-CM | POA: Diagnosis present

## 2014-11-28 DIAGNOSIS — I129 Hypertensive chronic kidney disease with stage 1 through stage 4 chronic kidney disease, or unspecified chronic kidney disease: Secondary | ICD-10-CM | POA: Diagnosis present

## 2014-11-28 DIAGNOSIS — D62 Acute posthemorrhagic anemia: Secondary | ICD-10-CM | POA: Diagnosis present

## 2014-11-28 DIAGNOSIS — K921 Melena: Secondary | ICD-10-CM | POA: Diagnosis present

## 2014-11-28 DIAGNOSIS — J690 Pneumonitis due to inhalation of food and vomit: Secondary | ICD-10-CM | POA: Diagnosis present

## 2014-11-28 DIAGNOSIS — Z7982 Long term (current) use of aspirin: Secondary | ICD-10-CM | POA: Diagnosis not present

## 2014-11-28 DIAGNOSIS — I69359 Hemiplegia and hemiparesis following cerebral infarction affecting unspecified side: Secondary | ICD-10-CM

## 2014-11-28 DIAGNOSIS — E780 Pure hypercholesterolemia: Secondary | ICD-10-CM | POA: Diagnosis present

## 2014-11-28 DIAGNOSIS — R34 Anuria and oliguria: Secondary | ICD-10-CM | POA: Diagnosis not present

## 2014-11-28 DIAGNOSIS — N183 Chronic kidney disease, stage 3 unspecified: Secondary | ICD-10-CM | POA: Diagnosis present

## 2014-11-28 DIAGNOSIS — I69354 Hemiplegia and hemiparesis following cerebral infarction affecting left non-dominant side: Secondary | ICD-10-CM

## 2014-11-28 DIAGNOSIS — Z8249 Family history of ischemic heart disease and other diseases of the circulatory system: Secondary | ICD-10-CM

## 2014-11-28 DIAGNOSIS — E872 Acidosis: Secondary | ICD-10-CM | POA: Diagnosis present

## 2014-11-28 DIAGNOSIS — F329 Major depressive disorder, single episode, unspecified: Secondary | ICD-10-CM | POA: Diagnosis present

## 2014-11-28 DIAGNOSIS — I6529 Occlusion and stenosis of unspecified carotid artery: Secondary | ICD-10-CM | POA: Diagnosis present

## 2014-11-28 DIAGNOSIS — M109 Gout, unspecified: Secondary | ICD-10-CM | POA: Diagnosis present

## 2014-11-28 DIAGNOSIS — L89322 Pressure ulcer of left buttock, stage 2: Secondary | ICD-10-CM | POA: Diagnosis present

## 2014-11-28 DIAGNOSIS — R001 Bradycardia, unspecified: Secondary | ICD-10-CM | POA: Diagnosis present

## 2014-11-28 DIAGNOSIS — Z7189 Other specified counseling: Secondary | ICD-10-CM | POA: Diagnosis not present

## 2014-11-28 DIAGNOSIS — E785 Hyperlipidemia, unspecified: Secondary | ICD-10-CM | POA: Diagnosis present

## 2014-11-28 DIAGNOSIS — Z515 Encounter for palliative care: Secondary | ICD-10-CM | POA: Diagnosis not present

## 2014-11-28 DIAGNOSIS — L8990 Pressure ulcer of unspecified site, unspecified stage: Secondary | ICD-10-CM | POA: Insufficient documentation

## 2014-11-28 DIAGNOSIS — J9601 Acute respiratory failure with hypoxia: Secondary | ICD-10-CM | POA: Diagnosis not present

## 2014-11-28 DIAGNOSIS — E1140 Type 2 diabetes mellitus with diabetic neuropathy, unspecified: Secondary | ICD-10-CM | POA: Diagnosis present

## 2014-11-28 DIAGNOSIS — E1122 Type 2 diabetes mellitus with diabetic chronic kidney disease: Secondary | ICD-10-CM | POA: Diagnosis present

## 2014-11-28 DIAGNOSIS — F0390 Unspecified dementia without behavioral disturbance: Secondary | ICD-10-CM | POA: Diagnosis present

## 2014-11-28 DIAGNOSIS — I272 Other secondary pulmonary hypertension: Secondary | ICD-10-CM | POA: Diagnosis present

## 2014-11-28 DIAGNOSIS — E11649 Type 2 diabetes mellitus with hypoglycemia without coma: Secondary | ICD-10-CM | POA: Diagnosis not present

## 2014-11-28 DIAGNOSIS — K219 Gastro-esophageal reflux disease without esophagitis: Secondary | ICD-10-CM | POA: Diagnosis present

## 2014-11-28 DIAGNOSIS — K922 Gastrointestinal hemorrhage, unspecified: Secondary | ICD-10-CM | POA: Diagnosis not present

## 2014-11-28 DIAGNOSIS — IMO0002 Reserved for concepts with insufficient information to code with codable children: Secondary | ICD-10-CM | POA: Diagnosis present

## 2014-11-28 DIAGNOSIS — Z833 Family history of diabetes mellitus: Secondary | ICD-10-CM

## 2014-11-28 DIAGNOSIS — I959 Hypotension, unspecified: Secondary | ICD-10-CM | POA: Diagnosis present

## 2014-11-28 DIAGNOSIS — Z66 Do not resuscitate: Secondary | ICD-10-CM | POA: Diagnosis present

## 2014-11-28 DIAGNOSIS — R9431 Abnormal electrocardiogram [ECG] [EKG]: Secondary | ICD-10-CM | POA: Diagnosis present

## 2014-11-28 DIAGNOSIS — K254 Chronic or unspecified gastric ulcer with hemorrhage: Secondary | ICD-10-CM | POA: Diagnosis not present

## 2014-11-28 DIAGNOSIS — I4581 Long QT syndrome: Secondary | ICD-10-CM | POA: Diagnosis present

## 2014-11-28 DIAGNOSIS — I69398 Other sequelae of cerebral infarction: Secondary | ICD-10-CM

## 2014-11-28 DIAGNOSIS — K625 Hemorrhage of anus and rectum: Secondary | ICD-10-CM

## 2014-11-28 DIAGNOSIS — L89312 Pressure ulcer of right buttock, stage 2: Secondary | ICD-10-CM | POA: Diagnosis present

## 2014-11-28 LAB — CBC WITH DIFFERENTIAL/PLATELET
Basophils Absolute: 0 10*3/uL (ref 0.0–0.1)
Basophils Relative: 0 %
EOS PCT: 2 %
Eosinophils Absolute: 0.1 10*3/uL (ref 0.0–0.7)
HCT: 36 % (ref 36.0–46.0)
Hemoglobin: 11 g/dL — ABNORMAL LOW (ref 12.0–15.0)
LYMPHS ABS: 1.5 10*3/uL (ref 0.7–4.0)
LYMPHS PCT: 26 %
MCH: 27.4 pg (ref 26.0–34.0)
MCHC: 30.6 g/dL (ref 30.0–36.0)
MCV: 89.6 fL (ref 78.0–100.0)
MONO ABS: 0.3 10*3/uL (ref 0.1–1.0)
MONOS PCT: 5 %
Neutro Abs: 3.8 10*3/uL (ref 1.7–7.7)
Neutrophils Relative %: 67 %
PLATELETS: 219 10*3/uL (ref 150–400)
RBC: 4.02 MIL/uL (ref 3.87–5.11)
RDW: 16.9 % — AB (ref 11.5–15.5)
WBC: 5.6 10*3/uL (ref 4.0–10.5)

## 2014-11-28 LAB — COMPREHENSIVE METABOLIC PANEL
ALT: 8 U/L — ABNORMAL LOW (ref 14–54)
AST: 12 U/L — ABNORMAL LOW (ref 15–41)
Albumin: 2.3 g/dL — ABNORMAL LOW (ref 3.5–5.0)
Alkaline Phosphatase: 57 U/L (ref 38–126)
Anion gap: 8 (ref 5–15)
BILIRUBIN TOTAL: 0.3 mg/dL (ref 0.3–1.2)
BUN: 14 mg/dL (ref 6–20)
CHLORIDE: 106 mmol/L (ref 101–111)
CO2: 31 mmol/L (ref 22–32)
CREATININE: 0.93 mg/dL (ref 0.44–1.00)
Calcium: 8.5 mg/dL — ABNORMAL LOW (ref 8.9–10.3)
GFR, EST AFRICAN AMERICAN: 59 mL/min — AB (ref >60)
GFR, EST NON AFRICAN AMERICAN: 51 mL/min — AB (ref >60)
Glucose, Bld: 80 mg/dL (ref 65–99)
POTASSIUM: 3.8 mmol/L (ref 3.5–5.1)
Sodium: 145 mmol/L (ref 135–145)
TOTAL PROTEIN: 6.1 g/dL — AB (ref 6.5–8.1)

## 2014-11-28 LAB — I-STAT CG4 LACTIC ACID, ED: LACTIC ACID, VENOUS: 2.24 mmol/L — AB (ref 0.5–2.0)

## 2014-11-28 LAB — POC OCCULT BLOOD, ED: Fecal Occult Bld: POSITIVE — AB

## 2014-11-28 LAB — PREPARE RBC (CROSSMATCH)

## 2014-11-28 MED ORDER — INSULIN ASPART PROT & ASPART (70-30 MIX) 100 UNIT/ML ~~LOC~~ SUSP: 3.0000 [IU] | Freq: Two times a day (BID) | SUBCUTANEOUS | Status: DC

## 2014-11-28 MED ORDER — DONEPEZIL HCL 10 MG PO TABS
10.0000 mg | ORAL_TABLET | Freq: Every day | ORAL | Status: DC
  Filled 2014-11-28 (×3): qty 1

## 2014-11-28 MED ORDER — SODIUM CHLORIDE 0.9 % IV SOLN: 10.0000 mL/h | Freq: Once | INTRAVENOUS | Status: AC

## 2014-11-28 MED ORDER — SODIUM CHLORIDE 0.9 % IV SOLN
Freq: Once | INTRAVENOUS | Status: AC
  Administered 2014-11-29: 01:00:00 via INTRAVENOUS

## 2014-11-28 MED ORDER — ACETAMINOPHEN 325 MG PO TABS: 650.0000 mg | ORAL_TABLET | Freq: Four times a day (QID) | ORAL | Status: DC | PRN

## 2014-11-28 MED ORDER — RISPERIDONE 0.5 MG PO TABS
0.5000 mg | ORAL_TABLET | Freq: Two times a day (BID) | ORAL | Status: DC
  Filled 2014-11-28 (×5): qty 1

## 2014-11-28 MED ORDER — SODIUM CHLORIDE 0.9 % IV BOLUS (SEPSIS)
500.0000 mL | Freq: Once | INTRAVENOUS | Status: AC
  Administered 2014-11-28: 500 mL via INTRAVENOUS

## 2014-11-28 MED ORDER — PANTOPRAZOLE SODIUM 40 MG IV SOLR
40.0000 mg | Freq: Two times a day (BID) | INTRAVENOUS | Status: DC
  Administered 2014-11-28 – 2014-11-30 (×4): 40 mg via INTRAVENOUS
  Filled 2014-11-28 (×6): qty 40

## 2014-11-28 MED ORDER — SODIUM CHLORIDE 0.9 % IV SOLN
Freq: Once | INTRAVENOUS | Status: AC
  Administered 2014-11-28: 20:00:00 via INTRAVENOUS

## 2014-11-28 MED ORDER — SODIUM CHLORIDE 0.9 % IV BOLUS (SEPSIS)
1000.0000 mL | Freq: Once | INTRAVENOUS | Status: AC
  Administered 2014-11-28: 1000 mL via INTRAVENOUS

## 2014-11-28 MED ORDER — GABAPENTIN 300 MG PO CAPS
300.0000 mg | ORAL_CAPSULE | Freq: Three times a day (TID) | ORAL | Status: DC
  Filled 2014-11-28 (×4): qty 1

## 2014-11-28 MED ORDER — ALBUTEROL SULFATE (2.5 MG/3ML) 0.083% IN NEBU
2.5000 mg | INHALATION_SOLUTION | RESPIRATORY_TRACT | Status: DC | PRN
Start: 2014-11-28 — End: 2014-11-30

## 2014-11-28 MED ORDER — INSULIN ASPART 100 UNIT/ML ~~LOC~~ SOLN: 0.0000 [IU] | Freq: Three times a day (TID) | SUBCUTANEOUS | Status: DC

## 2014-11-28 MED ORDER — LORATADINE 10 MG PO TABS
10.0000 mg | ORAL_TABLET | Freq: Every day | ORAL | Status: DC
  Filled 2014-11-28 (×2): qty 1

## 2014-11-28 MED ORDER — ALLOPURINOL 100 MG PO TABS
100.0000 mg | ORAL_TABLET | Freq: Two times a day (BID) | ORAL | Status: DC
  Filled 2014-11-28 (×5): qty 1

## 2014-11-28 MED ORDER — HYDROXYZINE HCL 50 MG/ML IM SOLN
25.0000 mg | Freq: Four times a day (QID) | INTRAMUSCULAR | Status: DC | PRN
  Filled 2014-11-28: qty 0.5

## 2014-11-28 MED ORDER — ATORVASTATIN CALCIUM 20 MG PO TABS
20.0000 mg | ORAL_TABLET | Freq: Every day | ORAL | Status: DC
  Filled 2014-11-28 (×2): qty 1

## 2014-11-28 MED ORDER — SERTRALINE HCL 50 MG PO TABS
50.0000 mg | ORAL_TABLET | Freq: Every day | ORAL | Status: DC
  Filled 2014-11-28 (×2): qty 1

## 2014-11-28 MED ORDER — ACETAMINOPHEN 650 MG RE SUPP
650.0000 mg | Freq: Four times a day (QID) | RECTAL | Status: DC | PRN
  Filled 2014-11-28: qty 1

## 2014-11-28 NOTE — ED Provider Notes (Signed)
CSN: [id number]     Arrival date & time 11/28/14  1840 History   First MD Initiated Contact with Patient 11/28/14 1903     Chief Complaint  Patient presents with  . GI Bleeding     (Consider location/radiation/quality/duration/timing/severity/associated sxs/prior Treatment) HPI Comments: 81 y.o. Female with history of UTI, HTN, DM, high cholesterol, PVD presents for rectal bleeding.  The patient was seen by a hospice nurse at her home today where they noted that she was passing blood from her rectum.  The patient is a DNR and hospice is helping her family coordinate her care but the patient is still receiving treatment for medical problems.  She does not have a history of GI bleed that the family is aware of.  No abdominal pain per the patient who can answer simple questions and the daughter reports it has not appeared as though the patient has had abdominal pain.  No constipation or diarrhea before this.  No fevers or chills.  The patient is on ASA but no anticoagulants.     Past Medical History  Diagnosis Date  . UTI (urinary tract infection)   . Hypertension   . Diabetes mellitus   . Gout   . High cholesterol   . Complication of anesthesia   . Diabetic neuropathy   . PVD (peripheral vascular disease)   . Carotid artery occlusion   . Arthritis   . Chronic kidney disease   . GERD (gastroesophageal reflux disease)   . Vertigo   . Venous insufficiency   . Mood disorder   . Dysphagia   . Hemiparesis and alteration of sensations as late effects of stroke 12/09/2013   Past Surgical History  Procedure Laterality Date  . Hemorrhoid banding    . Abdominal hysterectomy    . Cardiac catheterization    . Lower extremity angiogram N/A 05/26/2013    Procedure: LOWER EXTREMITY ANGIOGRAM;  Surgeon: Nada Libman, MD;  Location: [facility] CATH LAB;  Service: Cardiovascular;  Laterality: N/A;   Family History  Problem Relation Age of Onset  . Heart disease Daughter   . Hypertension Daughter   .  Diabetes Son   . Heart disease Son   . Diabetes Father   . Diabetes Sister   . Diabetes Sister    Social History  Substance Use Topics  . Smoking status: Never Smoker   . Smokeless tobacco: Never Used  . Alcohol Use: No   OB History    No data available     Review of Systems  Constitutional: Negative for fever, chills and appetite change.  HENT: Negative for facial swelling and rhinorrhea.   Eyes: Negative for discharge and redness.  Respiratory: Negative for cough and wheezing.   Cardiovascular: Negative for leg swelling.  Gastrointestinal: Positive for blood in stool and anal bleeding. Negative for nausea, vomiting, abdominal pain and diarrhea.  Genitourinary: Negative for hematuria and decreased urine volume.  Musculoskeletal: Negative for myalgias and back pain.  Skin: Negative for rash.  Neurological: Negative for seizures and syncope.  Hematological: Bruises/bleeds easily (on ASA).      Allergies  Review of patient's allergies indicates no known allergies.  Home Medications   Prior to Admission medications   Medication Sig Start Date End Date Taking? Authorizing Cleatus Goodin  albuterol (PROVENTIL HFA;VENTOLIN HFA) 108 (90 BASE) MCG/ACT inhaler Inhale 2 puffs into the lungs every 6 (six) hours as needed for wheezing or shortness of breath.   Yes Historical Torres Hardenbrook, MD  allopurinol (ZYLOPRIM) 100 MG  tablet Take 100 mg by mouth 2 (two) times daily.   Yes Historical Trevaun Rendleman, MD  aspirin EC 81 MG EC tablet Take 1 tablet (81 mg total) by mouth daily. 11/26/11  Yes Catarina Hartshorn, MD  atorvastatin (LIPITOR) 20 MG tablet TAKE 1 TABLET BY MOUTH EVERY DAY 01/18/14  Yes Margit Hanks, MD  cetirizine (ZYRTEC) 10 MG tablet Take 10 mg by mouth daily.   Yes Historical Gean Larose, MD  donepezil (ARICEPT) 10 MG tablet Take 10 mg by mouth at bedtime.    Yes Historical Shakirra Buehler, MD  furosemide (LASIX) 20 MG tablet Take 20 mg by mouth daily as needed for fluid.    Yes Historical Hamid Brookens, MD   gabapentin (NEURONTIN) 300 MG capsule Take 300 mg by mouth 3 (three) times daily.    Yes Historical Jaz Mallick, MD  insulin lispro protamine-lispro (HUMALOG 75/25 MIX) (75-25) 100 UNIT/ML SUSP injection 15 units in am and 5 units in pm Patient taking differently: 5-20 Units. 20 units in am and 5 units in pm 10/18/14  Yes Lonia Blood, MD  Maltodextrin-Xanthan Gum (RESOURCE THICKENUP CLEAR) POWD Take 1 g by mouth as needed (Per diet order.). 08/16/14  Yes Jeralyn Bennett, MD  Multiple Vitamin (MULTIVITAMIN WITH MINERALS) TABS tablet Take 1 tablet by mouth daily. 05/21/14  Yes Christiane Ha, MD  risperiDONE (RISPERDAL) 0.5 MG tablet Take 0.5 mg by mouth 2 (two) times daily. For hallucinations   Yes Historical Dresden Lozito, MD  sertraline (ZOLOFT) 50 MG tablet Take 50 mg by mouth daily.   Yes Historical Etola Mull, MD  Wheat Dextrin (BENEFIBER) CHEW Chew 1 tablet by mouth daily.   Yes Historical Laporche Martelle, MD  acetaminophen (TYLENOL) 325 MG tablet Take 2 tablets (650 mg total) by mouth every 6 (six) hours as needed for mild pain (or Fever >/= 101). 05/21/14   Christiane Ha, MD   BP 95/49 mmHg  Pulse 59  Temp(Src) 96.8 F (36 C) (Axillary)  Resp 17  SpO2 100% Physical Exam  Constitutional: No distress.  HENT:  Head: Normocephalic and atraumatic.  Right Ear: External ear normal.  Left Ear: External ear normal.  Mouth/Throat: Oropharynx is clear and moist.  Eyes: EOM are normal. Pupils are equal, round, and reactive to light.  Neck: Normal range of motion. Neck supple.  Cardiovascular: Regular rhythm.  Bradycardia present.   Murmur heard. Pulmonary/Chest: Effort normal. No respiratory distress. She has no wheezes. She has no rales.  Abdominal: Soft. She exhibits no distension and no mass. There is no tenderness.  Genitourinary:  Patient with bright red blood per rectum without stool  Musculoskeletal: She exhibits no edema or tenderness.  Neurological: She is alert.  Patient able to  answer simple questions  Skin: Skin is warm and dry. She is not diaphoretic.  Vitals reviewed.   ED Course  Procedures (including critical care time) Labs Review Labs Reviewed  CBC WITH DIFFERENTIAL/PLATELET - Abnormal; Notable for the following:    Hemoglobin 11.0 (*)    RDW 16.9 (*)    All other components within normal limits  COMPREHENSIVE METABOLIC PANEL - Abnormal; Notable for the following:    Calcium 8.5 (*)    Total Protein 6.1 (*)    Albumin 2.3 (*)    AST 12 (*)    ALT 8 (*)    GFR calc non Af Amer 51 (*)    GFR calc Af Amer 59 (*)    All other components within normal limits  I-STAT CG4 LACTIC ACID, ED -  Abnormal; Notable for the following:    Lactic Acid, Venous 2.24 (*)    All other components within normal limits  POC OCCULT BLOOD, ED - Abnormal; Notable for the following:    Fecal Occult Bld POSITIVE (*)    All other components within normal limits  BRAIN NATRIURETIC PEPTIDE  CBC  CBC  CBC  PROTIME-INR  APTT  BASIC METABOLIC PANEL  CBC  CBC  I-STAT CG4 LACTIC ACID, ED  TYPE AND SCREEN  ABO/RH  PREPARE PLATELET PHERESIS  PREPARE RBC (CROSSMATCH)    Imaging Review No results found. I have personally reviewed and evaluated these images and lab results as part of my medical decision-making.   EKG Interpretation None      MDM  Patient was seen and evaluated with family at bedside.  Blood pressure was borderline low.  Bright red blood per rectum.  Elevated lactic acid.  Platelets ordered for ASA reversal.  PRBCs ordered in light of hemodynamics and active bleeding.  Hb noted to be 11.  Discussed at length goals of care with daughter and patient to be DNR - order placed in the chart.  Discussed with the GI physician on call for [facility] who said that they would see the patient in the morning in consultation.  Discussed with Dr. Clyde Lundborg who agreed with admission and patient was admitted to the step down unit under the care of Dr. Clyde Lundborg. Final diagnoses:   Gastrointestinal hemorrhage associated with anorectal source    1. GI bleed  2. Lactic acidosis    Leta Baptist, MD 11/29/14 [id number]

## 2014-11-28 NOTE — H&P (Addendum)
[facility] History and Physical  Akita Mckimmy ZOX:[id number] DOB: [birth year] DOA: 11/28/2014  Referring physician: ED physician PCP: [name], MD  Specialists:   Chief Complaint:  Dark stool  HPI: [name] is a 81 y.o. female with PMH of hemorrhoid, hypertension, hyperlipidemia, diabetes mellitus, GERD, gout, depression, PVD, neuropathy, carotid artery stenosis, arthritis, CKD-3, venous insufficient,, motor disorder, diastolic congestive heart failure (EF of 60-65% with grade 1 diastolic dysfunction), dementia, who presents with the stool.  Patient has dementia and is unable to provide accurate medical history, therefore, most of the history is obtained by discussing the case with ED physician and her daughter. Per daughter, pt started having dark stool at 10 AM, has had approximately 5 episodes BM with dark stool today. Pt dose not have nausea, vomiting, abdominal pain, chest pain, shortness chest, fever, chills. Patient does not have symptoms of UTI. Per daughter, patient has left sided weakness and L facial droop after previous stroke, which has not changed. Pt also has right hand weakness which is normal to patient.  In ED, patient was found to have softer blood pressure with systolic pressure 80s to 90s with MAP 65, hemoglobin 11.1 on 10/21/14-->11.0 today, FOBT positive, lactate 2.24, bradycardia, temperature normal, electrolytes okay, renal function stable. Patient is admitted to inpatient for further evaluation treatment. GI was consulted by ED, will see in morning.  Where does patient live?   At home   SNF    Assistant living facility   [facility]irement center Can patient participate in ADLs?  Yes     Barely     None  Little     Some   Review of Systems:   General: no fevers, chills, no changes in body weight, has fatigue HEENT: no blurry vision, hearing changes or sore throat Pulm: no dyspnea, coughing, wheezing CV: no chest pain, palpitations Abd: no nausea,  vomiting, abdominal pain, diarrhea, constipation GU: no dysuria, burning on urination, increased urinary frequency, hematuria. Has dark stool. Ext: no leg edema Neuro: has chronic Left sided weakness and facial droop, and R hand weakness, No vision change or hearing loss Skin: no rash MSK: No muscle spasm, no deformity, no limitation of range of movement in spin Heme: No easy bruising.  Travel history: No recent long distant travel.  Allergy: No Known Allergies  Past Medical History  Diagnosis Date  . UTI (urinary tract infection)   . Hypertension   . Diabetes mellitus   . Gout   . High cholesterol   . Complication of anesthesia   . Diabetic neuropathy   . PVD (peripheral vascular disease)   . Carotid artery occlusion   . Arthritis   . Chronic kidney disease   . GERD (gastroesophageal reflux disease)   . Vertigo   . Venous insufficiency   . Mood disorder   . Dysphagia   . Hemiparesis and alteration of sensations as late effects of stroke 12/09/2013    Past Surgical History  Procedure Laterality Date  . Hemorrhoid banding    . Abdominal hysterectomy    . Cardiac catheterization    . Lower extremity angiogram N/A 05/26/2013    Procedure: LOWER EXTREMITY ANGIOGRAM;  Surgeon: Nada Libman, MD;  Location: [facility] CATH LAB;  Service: Cardiovascular;  Laterality: N/A;    Social History:  reports that she has never smoked. She has never used smokeless tobacco. She reports that she does not drink alcohol or use illicit drugs.  Family History:  Family History  Problem Relation Age of  Onset  . Heart disease Daughter   . Hypertension Daughter   . Diabetes Son   . Heart disease Son   . Diabetes Father   . Diabetes Sister   . Diabetes Sister      Prior to Admission medications   Medication Sig Start Date End Date Taking? Authorizing Mialee Weyman  acetaminophen (TYLENOL) 325 MG tablet Take 2 tablets (650 mg total) by mouth every 6 (six) hours as needed for mild pain (or Fever >/=  101). 05/21/14   Christiane Ha, MD  albuterol (PROVENTIL HFA;VENTOLIN HFA) 108 (90 BASE) MCG/ACT inhaler Inhale 2 puffs into the lungs every 6 (six) hours as needed for wheezing or shortness of breath.    Historical Kaili Castille, MD  allopurinol (ZYLOPRIM) 100 MG tablet Take 100 mg by mouth 2 (two) times daily.    Historical Aira Sallade, MD  aspirin EC 81 MG EC tablet Take 1 tablet (81 mg total) by mouth daily. 11/26/11   Catarina Hartshorn, MD  atorvastatin (LIPITOR) 20 MG tablet TAKE 1 TABLET BY MOUTH EVERY DAY 01/18/14   Margit Hanks, MD  cetirizine (ZYRTEC) 10 MG tablet Take 10 mg by mouth daily.    Historical Emmanuelle Hibbitts, MD  donepezil (ARICEPT) 10 MG tablet Take 10 mg by mouth at bedtime.     Historical Imagene Boss, MD  furosemide (LASIX) 20 MG tablet Take 20 mg by mouth daily as needed for fluid.     Historical Suly Vukelich, MD  gabapentin (NEURONTIN) 300 MG capsule Take 300 mg by mouth 3 (three) times daily.     Historical Lawson Isabell, MD  insulin aspart (NOVOLOG) 100 UNIT/ML injection Inject 0-1 Units into the skin 3 (three) times daily before meals. Patient uses sliding scale    Historical Moxie Kalil, MD  insulin lispro protamine-lispro (HUMALOG 75/25 MIX) (75-25) 100 UNIT/ML SUSP injection 15 units in am and 5 units in pm 10/18/14   Lonia Blood, MD  Maltodextrin-Xanthan Gum (RESOURCE THICKENUP CLEAR) POWD Take 1 g by mouth as needed (Per diet order.). 08/16/14   Jeralyn Bennett, MD  Multiple Vitamin (MULTIVITAMIN WITH MINERALS) TABS tablet Take 1 tablet by mouth daily. 05/21/14   Christiane Ha, MD  risperiDONE (RISPERDAL) 0.5 MG tablet Take 0.5 mg by mouth at bedtime. For hallucinations    Historical Kalika Smay, MD  sertraline (ZOLOFT) 50 MG tablet Take 50 mg by mouth daily.    Historical Joscelynn Brutus, MD  Wheat Dextrin (BENEFIBER) CHEW Chew 1 tablet by mouth daily.    Historical Emerson Schreifels, MD    Physical Exam: Filed Vitals:   11/28/14 2233 11/28/14 2245 11/28/14 2254 11/28/14 2300  BP:  94/52  Pulse: 64 25 52 67  Temp: 96.8 F (36 C)     TempSrc: Axillary     Resp: SpO2: 98% 100% 100% 100%   General: Not in acute distress HEENT:       Eyes: PERRL, EOMI, no scleral icterus.       ENT: No discharge from the ears and nose, no pharynx injection, no tonsillar enlargement.        Neck: No JVD, no bruit, no mass felt. Heme: No neck lymph node enlargement. Cardiac: S1/S2, RRR, No murmurs, No gallops or rubs. Pulm:  No rales, wheezing, rhonchi or rubs. Abd: Soft, nondistended, nontender, no rebound pain, no organomegaly, BS present. Ext: No pitting leg edema bilaterally. 2+DP/PT pulse bilaterally. Musculoskeletal: No joint deformities, No joint redness or warmth, no limitation of ROM in spin. Skin:  No rashes.  Neuro: Alert, oriented to person, not to place and time, cranial nerves II-XII grossly intact except for L facial droop, left-sided weakness. Psych: Patient is not psychotic, no suicidal or hemocidal ideation.  Labs on Admission:  Basic Metabolic Panel:  Recent Labs Lab 11/28/14 1957  NA 145  K 3.8  CL 106  CO2 31  GLUCOSE 80  BUN 14  CREATININE 0.93  CALCIUM 8.5*   Liver Function Tests:  Recent Labs Lab 11/28/14 1957  AST 12*  ALT 8*  ALKPHOS 57  BILITOT 0.3  PROT 6.1*  ALBUMIN 2.3*   No results for input(s): LIPASE, AMYLASE in the last 168 hours. No results for input(s): AMMONIA in the last 168 hours. CBC:  Recent Labs Lab 11/28/14 1957  WBC 5.6  NEUTROABS 3.8  HGB 11.0*  HCT 36.0  MCV 89.6  PLT 219   Cardiac Enzymes: No results for input(s): CKTOTAL, CKMB, CKMBINDEX, TROPONINI in the last 168 hours.  BNP (last 3 results)  Recent Labs  05/18/14 2056 08/13/14 1230  BNP 83.8 158.9*    ProBNP (last 3 results) No results for input(s): PROBNP in the last 8760 hours.  CBG: No results for input(s): GLUCAP in the last 168 hours.  Radiological Exams on Admission: No results found.  EKG: Not done in ED, will get  one.   Assessment/Plan Principal Problem:   GIB (gastrointestinal bleeding) Active Problems:   Diabetic neuropathy   Dyslipidemia   CKD (chronic kidney disease) stage 3, GFR 30-59 ml/min   Hemiparesis and alteration of sensations as late effects of stroke   Dysphagia   Pulmonary hypertension   Chronic diastolic heart failure, NYHA class 1   Diabetes type 2, uncontrolled   Prolonged QT interval   CVA, old, hemiparesis  GIB (gastrointestinal bleeding): source of bleeding is not clear, likely due to upper GI bleeding given the stool is dark. Bp is soft on admission, but MAP OK. Has levated lactate, indicating tissue hypoperfusion. GI was consulted by ED.  - will admit to SDU - IVF resuscitation: 1.5 L normal saline bolus, followed by 1 25 mL per hour - ED ordered one units of blood and 2 units of platelet (patient was on aspirin). - GI consulted by Ed, will follow up recommendations - NPO for possible EGD - Start IV pantoprazole 40 mg bib - prn Hydroxyzin IV for nausea - Avoid NSAIDs and SQ heparin - hold ASA and lasix - Maintain IV access (2 large bore IVs if possible). - Monitor closely and follow q6h cbc, transfuse as necessary. - LaB: INR, PTT - trend Lactate  CKD (chronic kidney disease) stage 3:  stable. Cre=0.93. -Follow-up renal function. BMP  HLD: Last LDL was 117 on 05/30/13. -Continue home medications: Lipitor -Check FLP  Chronic diastolic heart failure, NYHA class 1: 2-D echo on 08/31/13 showed EF 60-65% with grade 1 diastolic dysfunction. Patient was on low-dose Lasix 20 mg when necessary at home. Has no leg edema. CHF is compensated. -Hold Lasix -check BNP  DM-II: Last A1c 7.7 on 08/13/14, not well controled. Patient is taking Humalog 75/25, 20 units in the morning and 5 units in evening at home -will decrease insulin dose to 10 units in the morning and 3 units in the evening  -SSI  Addendum at 1:01 AM: RN found patient is more confused and her CBG=42.  -will  d/c Humalog 75/25 -continue SSI -start d5-1/2 NS at 100 cc/h -cbg q1h -prn D50.  Hx of stroke: Has left-sided  weakness. No new acute issues. -Hold aspirin due to GI bleeding  Hx of Prolonged QT interval: -Check EKG  DVT ppx: SCD Code Status: DNR Family Communication:  Yes, patient's daguhter at bed side Disposition Plan: Admit to inpatient   Date of Service 11/28/2014    Lorretta Harp [facility] Pager [phone]  If 7PM-7AM, please contact night-coverage [email or web address] Password [facility] 11/28/2014, 11:46 PM

## 2014-11-28 NOTE — ED Notes (Signed)
Per EMS: Pt from home. Home health at home today. Family called EMS for lower GI bleed. EMS reports clots and frank blood. Pt hx. Of stroke, L sided weakness/facial droop. Low sats at home, EMS placed pt on Non-rebreather 15L. Sats 100% with non-rebreather. BP 99/50, 60bpm.

## 2014-11-29 ENCOUNTER — Inpatient Hospital Stay (HOSPITAL_COMMUNITY)

## 2014-11-29 ENCOUNTER — Encounter (HOSPITAL_COMMUNITY): Payer: Self-pay | Admitting: *Deleted

## 2014-11-29 DIAGNOSIS — J9601 Acute respiratory failure with hypoxia: Secondary | ICD-10-CM

## 2014-11-29 DIAGNOSIS — L8990 Pressure ulcer of unspecified site, unspecified stage: Secondary | ICD-10-CM | POA: Insufficient documentation

## 2014-11-29 DIAGNOSIS — K922 Gastrointestinal hemorrhage, unspecified: Secondary | ICD-10-CM

## 2014-11-29 DIAGNOSIS — J810 Acute pulmonary edema: Secondary | ICD-10-CM

## 2014-11-29 DIAGNOSIS — E11649 Type 2 diabetes mellitus with hypoglycemia without coma: Secondary | ICD-10-CM

## 2014-11-29 DIAGNOSIS — J181 Lobar pneumonia, unspecified organism: Secondary | ICD-10-CM

## 2014-11-29 LAB — CBC
HCT: 27.8 % — ABNORMAL LOW (ref 36.0–46.0)
HEMATOCRIT: 26.6 % — AB (ref 36.0–46.0)
HEMATOCRIT: 28.2 % — AB (ref 36.0–46.0)
HEMATOCRIT: 29.6 % — AB (ref 36.0–46.0)
HEMATOCRIT: 33 % — AB (ref 36.0–46.0)
HEMOGLOBIN: 10.2 g/dL — AB (ref 12.0–15.0)
HEMOGLOBIN: 8 g/dL — AB (ref 12.0–15.0)
HEMOGLOBIN: 8.6 g/dL — AB (ref 12.0–15.0)
HEMOGLOBIN: 8.9 g/dL — AB (ref 12.0–15.0)
Hemoglobin: 8.6 g/dL — ABNORMAL LOW (ref 12.0–15.0)
MCH: 27 pg (ref 26.0–34.0)
MCH: 27.1 pg (ref 26.0–34.0)
MCH: 27.5 pg (ref 26.0–34.0)
MCH: 27.9 pg (ref 26.0–34.0)
MCH: 28 pg (ref 26.0–34.0)
MCHC: 30.1 g/dL (ref 30.0–36.0)
MCHC: 30.1 g/dL (ref 30.0–36.0)
MCHC: 30.5 g/dL (ref 30.0–36.0)
MCHC: 30.9 g/dL (ref 30.0–36.0)
MCHC: 30.9 g/dL (ref 30.0–36.0)
MCV: 89.9 fL (ref 78.0–100.0)
MCV: 90.1 fL (ref 78.0–100.0)
MCV: 90.2 fL (ref 78.0–100.0)
MCV: 90.3 fL (ref 78.0–100.0)
MCV: 90.7 fL (ref 78.0–100.0)
PLATELETS: 266 10*3/uL (ref 150–400)
Platelets: 237 10*3/uL (ref 150–400)
Platelets: 273 10*3/uL (ref 150–400)
Platelets: 274 10*3/uL (ref 150–400)
Platelets: 289 10*3/uL (ref 150–400)
RBC: 2.96 MIL/uL — AB (ref 3.87–5.11)
RBC: 3.08 MIL/uL — AB (ref 3.87–5.11)
RBC: 3.13 MIL/uL — ABNORMAL LOW (ref 3.87–5.11)
RBC: 3.28 MIL/uL — AB (ref 3.87–5.11)
RBC: 3.64 MIL/uL — AB (ref 3.87–5.11)
RDW: 15.8 % — ABNORMAL HIGH (ref 11.5–15.5)
RDW: 15.9 % — ABNORMAL HIGH (ref 11.5–15.5)
RDW: 16.1 % — ABNORMAL HIGH (ref 11.5–15.5)
RDW: 16.1 % — ABNORMAL HIGH (ref 11.5–15.5)
RDW: 17 % — AB (ref 11.5–15.5)
WBC: 10.7 10*3/uL — ABNORMAL HIGH (ref 4.0–10.5)
WBC: 14 10*3/uL — AB (ref 4.0–10.5)
WBC: 15.5 10*3/uL — ABNORMAL HIGH (ref 4.0–10.5)
WBC: 15.8 10*3/uL — ABNORMAL HIGH (ref 4.0–10.5)
WBC: 17.2 10*3/uL — AB (ref 4.0–10.5)

## 2014-11-29 LAB — CBG MONITORING, ED
GLUCOSE-CAPILLARY: 152 mg/dL — AB (ref 65–99)
Glucose-Capillary: 42 mg/dL — CL (ref 65–99)

## 2014-11-29 LAB — PROTIME-INR
INR: 1.12 (ref 0.00–1.49)
PROTHROMBIN TIME: 14.6 s (ref 11.6–15.2)

## 2014-11-29 LAB — BRAIN NATRIURETIC PEPTIDE: B Natriuretic Peptide: 129.6 pg/mL — ABNORMAL HIGH (ref 0.0–100.0)

## 2014-11-29 LAB — BASIC METABOLIC PANEL
ANION GAP: 8 (ref 5–15)
BUN: 13 mg/dL (ref 6–20)
CALCIUM: 8.1 mg/dL — AB (ref 8.9–10.3)
CHLORIDE: 109 mmol/L (ref 101–111)
CO2: 29 mmol/L (ref 22–32)
CREATININE: 0.8 mg/dL (ref 0.44–1.00)
GFR calc non Af Amer: >60 mL/min (ref >60)
Glucose, Bld: 109 mg/dL — ABNORMAL HIGH (ref 65–99)
Potassium: 3.8 mmol/L (ref 3.5–5.1)
SODIUM: 146 mmol/L — AB (ref 135–145)

## 2014-11-29 LAB — APTT: aPTT: 28 seconds (ref 24–37)

## 2014-11-29 LAB — GLUCOSE, CAPILLARY
GLUCOSE-CAPILLARY: 105 mg/dL — AB (ref 65–99)
GLUCOSE-CAPILLARY: 106 mg/dL — AB (ref 65–99)
GLUCOSE-CAPILLARY: 128 mg/dL — AB (ref 65–99)
GLUCOSE-CAPILLARY: 102 mg/dL — AB (ref 65–99)
Glucose-Capillary: 106 mg/dL — ABNORMAL HIGH (ref 65–99)
Glucose-Capillary: 107 mg/dL — ABNORMAL HIGH (ref 65–99)

## 2014-11-29 LAB — I-STAT CG4 LACTIC ACID, ED: LACTIC ACID, VENOUS: 1.53 mmol/L (ref 0.5–2.0)

## 2014-11-29 LAB — MRSA PCR SCREENING: MRSA BY PCR: NEGATIVE

## 2014-11-29 LAB — ABO/RH: ABO/RH(D): O POS

## 2014-11-29 MED ORDER — DEXTROSE 50 % IV SOLN
25.0000 mL | INTRAVENOUS | Status: DC | PRN
  Administered 2014-11-29: 25 mL via INTRAVENOUS

## 2014-11-29 MED ORDER — CETYLPYRIDINIUM CHLORIDE 0.05 % MT LIQD
7.0000 mL | Freq: Two times a day (BID) | OROMUCOSAL | Status: DC
  Administered 2014-11-29 – 2014-12-01 (×4): 7 mL via OROMUCOSAL

## 2014-11-29 MED ORDER — FUROSEMIDE 10 MG/ML IJ SOLN
60.0000 mg | Freq: Once | INTRAMUSCULAR | Status: AC
  Administered 2014-11-29: 60 mg via INTRAVENOUS
  Filled 2014-11-29: qty 6

## 2014-11-29 MED ORDER — DEXTROSE 5 % IV SOLN
500.0000 mg | INTRAVENOUS | Status: DC
  Administered 2014-11-29 – 2014-11-30 (×2): 500 mg via INTRAVENOUS
  Filled 2014-11-29 (×4): qty 500

## 2014-11-29 MED ORDER — DEXTROSE 5 % IV SOLN
1.0000 g | INTRAVENOUS | Status: DC
  Administered 2014-11-29 – 2014-11-30 (×2): 1 g via INTRAVENOUS
  Filled 2014-11-29 (×4): qty 10

## 2014-11-29 MED ORDER — DEXTROSE 50 % IV SOLN
INTRAVENOUS | Status: AC
  Filled 2014-11-29: qty 50

## 2014-11-29 MED ORDER — CHLORHEXIDINE GLUCONATE 0.12 % MT SOLN
15.0000 mL | Freq: Two times a day (BID) | OROMUCOSAL | Status: DC
  Administered 2014-11-29 – 2014-12-01 (×3): 15 mL via OROMUCOSAL
  Filled 2014-11-29 (×6): qty 15

## 2014-11-29 MED ORDER — INSULIN ASPART 100 UNIT/ML ~~LOC~~ SOLN
0.0000 [IU] | SUBCUTANEOUS | Status: DC
  Administered 2014-11-29: 1 [IU] via SUBCUTANEOUS

## 2014-11-29 MED ORDER — FUROSEMIDE 10 MG/ML IJ SOLN: 40.0000 mg | Freq: Once | INTRAMUSCULAR | Status: DC

## 2014-11-29 MED ORDER — DEXTROSE-NACL 5-0.45 % IV SOLN
INTRAVENOUS | Status: DC
  Administered 2014-11-29: 01:00:00 via INTRAVENOUS

## 2014-11-29 NOTE — Progress Notes (Signed)
PT Cancellation Note  Patient Details Name: [name] MRN: [id number] DOB: [birth year]   Cancelled Treatment:    Reason Eval/Treat Not Completed: Medical issues which prohibited therapy (Pt on bedrest per order.  On nonrebreather mask.  Will check back tomorrow.  MD:  Please update activity level as appropriate.  Thanks.)   [name] F 11/29/2014, 10:39 AM  [name] [phone] (pager)

## 2014-11-29 NOTE — ED Notes (Signed)
Attempted report 

## 2014-11-29 NOTE — Progress Notes (Signed)
Home Care Social Worker (SW) made visit after learning Patient was admitted to the hospital.  Patient was asleep, staff was in drawing labs and the tech that was present talked about having to suction her today.  SW spoke to [name] by phone and she would like her to come home when it is time for discharge.  SW will continue to follow and provide support.  Maretta Los, LCSW [facility] [phone]

## 2014-11-29 NOTE — Progress Notes (Signed)
Notified dr Gonzella Lex on pt,sbp dropped to 90's, pt more lethargic then this am, not responding to painful stimuli, ask and rec'd orders to NTS. NTS patient and only rec'd a small amount of thick secretions. Will continue to monitor.

## 2014-11-29 NOTE — Progress Notes (Signed)
Inpatient [facility] Rm 3 S 10 [facility] RN visit. GIP related admission to [facility] Dx: Alzheimer's Dementia, Pt. Is a DNR code status. Pt. Seen briefly in room with non-rebreather mask on.No family present in the room. Pt's respirations regular yet rapid. [facility] will continue to follow daily. [facility] med list and transfer summary placed on shadow chart. Please call with any questions.  Sharen Heck RN [facility] Liaison [phone]

## 2014-11-29 NOTE — Consult Note (Signed)
[facility] Consult Note  Referring Provider: No ref. provider found Primary Care Physician:  Philis Fendt, MD Primary Gastroenterologist:  Dr.  Laurel Dimmer Complaint: dark stools HPI: [name] is an 81 y.o. black female  presented to the emergency room with reported blood in her stool, also described as dark stool by the ICU nurse. Her hemoglobin was 10.8, close to baseline, stool is guaiac positive and BUN and creatinine are normal. Currently she is on a 100% nonrebreather and having significant difficulties with her respiratory secretions. A discussion was apparently made with the patient's daughter during the night about possible palliative care options. She can currently not give a history and I do not see any record of previous colonoscopy or EGD.  Past Medical History  Diagnosis Date  . UTI (urinary tract infection)   . Hypertension   . Diabetes mellitus   . Gout   . High cholesterol   . Complication of anesthesia   . Diabetic neuropathy   . PVD (peripheral vascular disease)   . Carotid artery occlusion   . Arthritis   . Chronic kidney disease   . GERD (gastroesophageal reflux disease)   . Vertigo   . Venous insufficiency   . Mood disorder   . Dysphagia   . Hemiparesis and alteration of sensations as late effects of stroke 12/09/2013    Past Surgical History  Procedure Laterality Date  . Hemorrhoid banding    . Abdominal hysterectomy    . Cardiac catheterization    . Lower extremity angiogram N/A 05/26/2013    Procedure: LOWER EXTREMITY ANGIOGRAM;  Surgeon: Serafina Mitchell, MD;  Location: [facility] CATH LAB;  Service: Cardiovascular;  Laterality: N/A;    Medications Prior to Admission  Medication Sig Dispense Refill  . albuterol (PROVENTIL HFA;VENTOLIN HFA) 108 (90 BASE) MCG/ACT inhaler Inhale 2 puffs into the lungs every 6 (six) hours as needed for wheezing or shortness of breath.    . allopurinol (ZYLOPRIM) 100 MG tablet Take 100 mg by mouth 2 (two) times  daily.    Marland Kitchen aspirin EC 81 MG EC tablet Take 1 tablet (81 mg total) by mouth daily.    Marland Kitchen atorvastatin (LIPITOR) 20 MG tablet TAKE 1 TABLET BY MOUTH EVERY DAY 30 tablet 0  . cetirizine (ZYRTEC) 10 MG tablet Take 10 mg by mouth daily.    Marland Kitchen donepezil (ARICEPT) 10 MG tablet Take 10 mg by mouth at bedtime.     . furosemide (LASIX) 20 MG tablet Take 20 mg by mouth daily as needed for fluid.     Marland Kitchen gabapentin (NEURONTIN) 300 MG capsule Take 300 mg by mouth 3 (three) times daily.     . insulin lispro protamine-lispro (HUMALOG 75/25 MIX) (75-25) 100 UNIT/ML SUSP injection 15 units in am and 5 units in pm (Patient taking differently: 5-20 Units. 20 units in am and 5 units in pm) 10 mL 11  . Maltodextrin-Xanthan Gum (RESOURCE THICKENUP CLEAR) POWD Take 1 g by mouth as needed (Per diet order.). 1 Can 0  . Multiple Vitamin (MULTIVITAMIN WITH MINERALS) TABS tablet Take 1 tablet by mouth daily.    . risperiDONE (RISPERDAL) 0.5 MG tablet Take 0.5 mg by mouth 2 (two) times daily. For hallucinations    . sertraline (ZOLOFT) 50 MG tablet Take 50 mg by mouth daily.    . Wheat Dextrin (BENEFIBER) CHEW Chew 1 tablet by mouth daily.    Marland Kitchen acetaminophen (TYLENOL) 325 MG tablet Take 2 tablets (650 mg total) by mouth every 6 (  six) hours as needed for mild pain (or Fever >/= 101).      Allergies: No Known Allergies  Family History  Problem Relation Age of Onset  . Heart disease Daughter   . Hypertension Daughter   . Diabetes Son   . Heart disease Son   . Diabetes Father   . Diabetes Sister   . Diabetes Sister     Social History:  reports that she has never smoked. She has never used smokeless tobacco. She reports that she does not drink alcohol or use illicit drugs.  Review of Systems: negative except unobtainable   Blood pressure 134/61, pulse 90, temperature 97.4 F (36.3 C), temperature source Axillary, resp. rate 18, height 5' 5"  (1.651 m), weight 63.9 kg (140 lb 14 oz), SpO2 100 %. Head: Normocephalic,  without obvious abnormality, atraumatic Neck: no adenopathy, no carotid bruit, no JVD, supple, symmetrical, trachea midline and thyroid not enlarged, symmetric, no tenderness/mass/nodules Resp: clear to auscultation bilaterally Cardio: regular rate and rhythm, S1, S2 normal, no murmur, click, rub or gallop GI: Abdomen soft nondistended with normoactive bowel sounds. No hepatosplenomegaly mass or guarding. Extremities: extremities normal, atraumatic, no cyanosis or edema  Results for orders placed or performed during the hospital encounter of 11/28/14 (from the past 48 hour(s))  Type and screen     Status: None (Preliminary result)   Collection Time: 11/28/14  7:46 PM  Result Value Ref Range   ABO/RH(D) O POS    Antibody Screen NEG    Sample Expiration 12/01/2014    Unit Number [id number]    Blood Component Type RED CELLS,LR    Unit division 00    Status of Unit ISSUED    Transfusion Status OK TO TRANSFUSE    Crossmatch Result Compatible    Unit Number [id number]    Blood Component Type RED CELLS,LR    Unit division 00    Status of Unit ALLOCATED    Transfusion Status OK TO TRANSFUSE    Crossmatch Result Compatible   ABO/Rh     Status: None   Collection Time: 11/28/14  7:46 PM  Result Value Ref Range   ABO/RH(D) O POS   I-Stat CG4 Lactic Acid, ED     Status: Abnormal   Collection Time: 11/28/14  7:56 PM  Result Value Ref Range   Lactic Acid, Venous 2.24 (HH) 0.5 - 2.0 mmol/L   Comment NOTIFIED PHYSICIAN   POC occult blood, ED     Status: Abnormal   Collection Time: 11/28/14  7:56 PM  Result Value Ref Range   Fecal Occult Bld POSITIVE (A) NEGATIVE  CBC with Differential     Status: Abnormal   Collection Time: 11/28/14  7:57 PM  Result Value Ref Range   WBC 5.6 4.0 - 10.5 K/uL    Comment: WHITE COUNT CONFIRMED ON SMEAR   RBC 4.02 3.87 - 5.11 MIL/uL   Hemoglobin 11.0 (L) 12.0 - 15.0 g/dL   HCT 36.0 36.0 - 46.0 %   MCV 89.6 78.0 - 100.0 fL   MCH 27.4 26.0 - 34.0 pg    MCHC 30.6 30.0 - 36.0 g/dL   RDW 16.9 (H) 11.5 - 15.5 %   Platelets 219 150 - 400 K/uL    Comment: PLATELET COUNT CONFIRMED BY SMEAR   Neutrophils Relative % 67 %   Neutro Abs 3.8 1.7 - 7.7 K/uL   Lymphocytes Relative 26 %   Lymphs Abs 1.5 0.7 - 4.0 K/uL   Monocytes Relative 5 %  Monocytes Absolute 0.3 0.1 - 1.0 K/uL   Eosinophils Relative 2 %   Eosinophils Absolute 0.1 0.0 - 0.7 K/uL   Basophils Relative 0 %   Basophils Absolute 0.0 0.0 - 0.1 K/uL  Comprehensive metabolic panel     Status: Abnormal   Collection Time: 11/28/14  7:57 PM  Result Value Ref Range   Sodium 145 135 - 145 mmol/L   Potassium 3.8 3.5 - 5.1 mmol/L   Chloride 106 101 - 111 mmol/L   CO2 31 22 - 32 mmol/L   Glucose, Bld 80 65 - 99 mg/dL   BUN 14 6 - 20 mg/dL   Creatinine, Ser 0.93 0.44 - 1.00 mg/dL   Calcium 8.5 (L) 8.9 - 10.3 mg/dL   Total Protein 6.1 (L) 6.5 - 8.1 g/dL   Albumin 2.3 (L) 3.5 - 5.0 g/dL   AST 12 (L) 15 - 41 U/L   ALT 8 (L) 14 - 54 U/L   Alkaline Phosphatase 57 38 - 126 U/L   Total Bilirubin 0.3 0.3 - 1.2 mg/dL   GFR calc non Af Amer 51 (L) >60 mL/min   GFR calc Af Amer 59 (L) >60 mL/min    Comment: (NOTE) The eGFR has been calculated using the CKD EPI equation. This calculation has not been validated in all clinical situations. eGFR's persistently <60 mL/min signify possible Chronic Kidney Disease.    Anion gap 8 5 - 15  Prepare RBC     Status: None   Collection Time: 11/28/14  9:40 PM  Result Value Ref Range   Order Confirmation ORDER PROCESSED BY BLOOD BANK   Prepare Pheresed Platelets     Status: None (Preliminary result)   Collection Time: 11/28/14  9:41 PM  Result Value Ref Range   Unit Number [id number]    Blood Component Type PLTPHER LR1    Unit division 00    Status of Unit ISSUED    Transfusion Status OK TO TRANSFUSE    Unit Number [id number]    Blood Component Type PLTPHER LR1    Unit division 00    Status of Unit ISSUED    Transfusion Status OK TO  TRANSFUSE   I-Stat CG4 Lactic Acid, ED     Status: None   Collection Time: 11/29/14 12:25 AM  Result Value Ref Range   Lactic Acid, Venous 1.53 0.5 - 2.0 mmol/L  CBC     Status: Abnormal   Collection Time: 11/29/14 12:37 AM  Result Value Ref Range   WBC 10.7 (H) 4.0 - 10.5 K/uL   RBC 2.96 (L) 3.87 - 5.11 MIL/uL   Hemoglobin 8.0 (L) 12.0 - 15.0 g/dL    Comment: DELTA CHECK NOTED   HCT 26.6 (L) 36.0 - 46.0 %   MCV 89.9 78.0 - 100.0 fL   MCH 27.0 26.0 - 34.0 pg   MCHC 30.1 30.0 - 36.0 g/dL   RDW 17.0 (H) 11.5 - 15.5 %   Platelets 237 150 - 400 K/uL  Protime-INR     Status: None   Collection Time: 11/29/14 12:37 AM  Result Value Ref Range   Prothrombin Time 14.6 11.6 - 15.2 seconds   INR 1.12 0.00 - 1.49  APTT     Status: None   Collection Time: 11/29/14 12:37 AM  Result Value Ref Range   aPTT 28 24 - 37 seconds  CBG monitoring, ED     Status: Abnormal   Collection Time: 11/29/14 12:54 AM  Result Value Ref Range  Glucose-Capillary 42 (LL) 65 - 99 mg/dL  CBG monitoring, ED     Status: Abnormal   Collection Time: 11/29/14  1:09 AM  Result Value Ref Range   Glucose-Capillary 152 (H) 65 - 99 mg/dL  MRSA PCR Screening     Status: None   Collection Time: 11/29/14  2:44 AM  Result Value Ref Range   MRSA by PCR NEGATIVE NEGATIVE    Comment:        The GeneXpert MRSA Assay (FDA approved for NASAL specimens only), is one component of a comprehensive MRSA colonization surveillance program. It is not intended to diagnose MRSA infection nor to guide or monitor treatment for MRSA infections.   Glucose, capillary     Status: Abnormal   Collection Time: 11/29/14  4:42 AM  Result Value Ref Range   Glucose-Capillary 105 (H) 65 - 99 mg/dL  Brain natriuretic peptide     Status: Abnormal   Collection Time: 11/29/14  7:54 AM  Result Value Ref Range   B Natriuretic Peptide 129.6 (H) 0.0 - 100.0 pg/mL  CBC     Status: Abnormal   Collection Time: 11/29/14  7:54 AM  Result Value Ref  Range   WBC 15.5 (H) 4.0 - 10.5 K/uL   RBC 3.64 (L) 3.87 - 5.11 MIL/uL   Hemoglobin 10.2 (L) 12.0 - 15.0 g/dL    Comment: POST TRANSFUSION SPECIMEN   HCT 33.0 (L) 36.0 - 46.0 %   MCV 90.7 78.0 - 100.0 fL   MCH 28.0 26.0 - 34.0 pg   MCHC 30.9 30.0 - 36.0 g/dL   RDW 15.9 (H) 11.5 - 15.5 %   Platelets 289 150 - 400 K/uL  Basic metabolic panel     Status: Abnormal   Collection Time: 11/29/14  7:54 AM  Result Value Ref Range   Sodium 146 (H) 135 - 145 mmol/L   Potassium 3.8 3.5 - 5.1 mmol/L   Chloride 109 101 - 111 mmol/L   CO2 29 22 - 32 mmol/L   Glucose, Bld 109 (H) 65 - 99 mg/dL   BUN 13 6 - 20 mg/dL   Creatinine, Ser 0.80 0.44 - 1.00 mg/dL   Calcium 8.1 (L) 8.9 - 10.3 mg/dL   GFR calc non Af Amer >60 >60 mL/min   GFR calc Af Amer >60 >60 mL/min    Comment: (NOTE) The eGFR has been calculated using the CKD EPI equation. This calculation has not been validated in all clinical situations. eGFR's persistently <60 mL/min signify possible Chronic Kidney Disease.    Anion gap 8 5 - 15  Glucose, capillary     Status: Abnormal   Collection Time: 11/29/14  8:13 AM  Result Value Ref Range   Glucose-Capillary 106 (H) 65 - 99 mg/dL   Dg Chest Port 1 View  11/29/2014   CLINICAL DATA:  Gurgling sounds in throat.  EXAM: PORTABLE CHEST 1 VIEW  COMPARISON:  10/16/2014  FINDINGS: Lungs are hypoinflated with opacification over the left perihilar region left upper lung with obscuration of the left lateral border of the aortic arch. Mild opacification in the left retrocardiac region. Calcified AP window lymph node unchanged. No evidence of effusion or pneumothorax. Mild stable cardiomegaly. Calcified plaque over the aortic arch unchanged. Remainder of the exam is unchanged.  IMPRESSION: Opacification over left perihilar region and left upper lung with obscuration left lateral border of the aortic arch. Mild left retrocardiac opacification. Findings likely due to pneumonia, however would recommend  contrast-enhanced chest CT to  exclude acute aortic process.  Mild stable cardiomegaly.   Electronically Signed   By: Marin Olp M.D.   On: 11/29/2014 08:41    Assessment: Rectal bleeding, appearance not clear thus far as to whether dark stools or bright red blood. Does not appear to be destabilizing, primary problem at present seems to be respiratory. Plan:  Is not a candidate for either upper or lower endoscopy at present. Given her age I would be disinclined to pursue endoscopic workup even if her respiratory status improves, unless evidence of active or destabilizing GI bleeding. We'll follow at a distance. Please call if urgent endoscopy requested. HAYES,JOHN C 11/29/2014, 8:51 AM  Pager [phone] If no answer or after 5 PM call [phone]

## 2014-11-29 NOTE — Progress Notes (Signed)
[facility] PROGRESS NOTE  Lilliane Sposito ZOX:[id number] DOB: 1917-10-14 DOA: 11/28/2014 PCP: [name], MD  Brief narrative 81 year old female with history of hypertension, hyperlipidemia, diabetes mellitus, depression, carotid artery stenosis, history of stroke with left facial droop and sided weakness, chronic kidney disease stage III, diastolic CHF, dementia, GERD, who presented with Doxil vitals (total 5 episodes) without associated nausea, vomiting, abdominal pain, chest pain, shortness of breath, fevers or chills. On presentation she was hypotensive with systolic blood pressure in the 80s, hemoglobin of 11 (around baseline), lactic acid 4, bradycardic. [facility] consulted from the ED. Subsequent hemoglobin dropped to 8. Patient transfused 1 unit PRBC and 2 units of platelets. And admitted to stepdown.   Assessment/Plan: GI bleed with symptomatic anemia Drop in hemoglobin to 8. Received one unit PRBC . I don't understand why she got 2 units of platelets. Patient also brought IV hydration in the ED. Monitor serial H&H. Continue IV PPI. Given her respiratory status patient is not interested to get EGD or colonoscopy at this time. Appreciate GI recommendation. We'll monitor for now.  Acute hypoxic respiratory failure Possibly in the setting of underlying pneumonia and volume overload in the setting of IV fluids and transfusions. Fluid discontinued and ordered 60 mg IV Lasix. Continue NRB. Monitor respiratory function closely. Discussed with radiologist on the phone who recommends that could be some mediastinal widening and follow-up with contrast chest CT. Patient hemodynamically stable at this time and will not pursue further imaging. If respiratory function does not improve or deteriorate despite IV Lasix the goal may be to transition her to full comfort. I have discussed this with her daughter and she agrees.  Community acquired pneumonia/aspiration pneumonia As outlined on chest  x-ray. Placed on empiric Rocephin and azithromycin.  Chronic kidney disease Stable  Chronic diastolic CHF Presented with hypotension but developed fluid overload. Monitor with IV Lasix.  Type 2 diabetes mellitus CBG of 42 this morning. Discontinue her home insulin. Monitor closely.  History of stroke Aspirin on hold.   Diet: Nothing by mouth DVT prophylaxis: SCDs  Code Status: DO NOT RESUSCITATE, patient followed by home hospice. Discussed guarded prognosis with daughter and she understands. She agrees with treating reversible conditions that would improve her quality of life. Agrees that if her condition deteriorates she should be made full comfort. Condition is guarded Family Communication: Discussed with daughter Dois Davenport on the phone  Disposition Plan: Continue step down monitoring. If unimproved or deteriorates with transition to medical floor for full comfort.   Consultants:  [facility]  Procedures:  None  Antibiotics:  IV Rocephin and azithromycin  HPI/Subjective: Seen and examined. Patient on nonrebreather with respiratory distress. Discontinue IV fluids in order 60 mg IV Lasix and sats chest x-ray.  Objective: Filed Vitals:   11/29/14 0806  BP:   Pulse:   Temp: 97.4 F (36.3 C)  Resp:     Intake/Output Summary (Last 24 hours) at 11/29/14 1043 Last data filed at 11/29/14 1027  Gross per 24 hour  Intake 2487.75 ml  Output      2 ml  Net 2485.75 ml   Filed Weights   11/29/14 0300  Weight: 63.9 kg (140 lb 14 oz)    Exam:   General: Elderly female lying in bed on nonrebreather, poorly responsive with eye opening only, grunting respirations  HEENT: Poor eye opening, on nonrebreather  Chest: Coarse breath sounds bilaterally with crackles and increased work of breathing  CVS: S1 and S2 normal, no murmurs rub or gallop  GI: Soft, nondistended, nontender, bowel sounds present  Musculoskeletal: Warm, no edema  CNS: Awake to commands with eye  opening only , non oriented    : Data Reviewed: Basic Metabolic Panel:  Recent Labs Lab 11/28/14 1957 11/29/14 0754  NA 145 146*  K 3.8 3.8  CL 106 109  CO2 31 29  GLUCOSE 80 109*  BUN 14 13  CREATININE 0.93 0.80  CALCIUM 8.5* 8.1*   Liver Function Tests:  Recent Labs Lab 11/28/14 1957  AST 12*  ALT 8*  ALKPHOS 57  BILITOT 0.3  PROT 6.1*  ALBUMIN 2.3*   No results for input(s): LIPASE, AMYLASE in the last 168 hours. No results for input(s): AMMONIA in the last 168 hours. CBC:  Recent Labs Lab 11/28/14 1957 11/29/14 0037 11/29/14 0754  WBC 5.6 10.7* 15.5*  NEUTROABS 3.8  --   --   HGB 11.0* 8.0* 10.2*  HCT 36.0 26.6* 33.0*  MCV 89.6 89.9 90.7  PLT 219 237 289   Cardiac Enzymes: No results for input(s): CKTOTAL, CKMB, CKMBINDEX, TROPONINI in the last 168 hours. BNP (last 3 results)  Recent Labs  05/18/14 2056 08/13/14 1230 11/29/14 0754  BNP 83.8 158.9* 129.6*    ProBNP (last 3 results) No results for input(s): PROBNP in the last 8760 hours.  CBG:  Recent Labs Lab 11/29/14 0054 11/29/14 0109 11/29/14 0145 11/29/14 0442 11/29/14 0813  GLUCAP 42* 152* 106* 105* 106*    Recent Results (from the past 240 hour(s))  MRSA PCR Screening     Status: None   Collection Time: 11/29/14  2:44 AM  Result Value Ref Range Status   MRSA by PCR NEGATIVE NEGATIVE Final    Comment:        The GeneXpert MRSA Assay (FDA approved for NASAL specimens only), is one component of a comprehensive MRSA colonization surveillance program. It is not intended to diagnose MRSA infection nor to guide or monitor treatment for MRSA infections.      Studies: Dg Chest Port 1 View  11/29/2014   ADDENDUM REPORT: 11/29/2014 09:56 ADDENDUM: These results were called by telephone at the time of interpretation on 11/29/2014 at 9:20 am to Dr. Eddie North , who verbally acknowledged these results. Electronically Signed   By: Elberta Fortis M.D.   On: 11/29/2014  09:56  11/29/2014   CLINICAL DATA:  Gurgling sounds in throat.  EXAM: PORTABLE CHEST 1 VIEW  COMPARISON:  10/16/2014  FINDINGS: Lungs are hypoinflated with opacification over the left perihilar region left upper lung with obscuration of the left lateral border of the aortic arch. Mild opacification in the left retrocardiac region. Calcified AP window lymph node unchanged. No evidence of effusion or pneumothorax. Mild stable cardiomegaly. Calcified plaque over the aortic arch unchanged. Remainder of the exam is unchanged.  IMPRESSION: Opacification over left perihilar region and left upper lung with obscuration left lateral border of the aortic arch. Mild left retrocardiac opacification. Findings likely due to pneumonia, however would recommend contrast-enhanced chest CT to exclude acute aortic process.  Mild stable cardiomegaly.  Electronically Signed: By: Elberta Fortis M.D. On: 11/29/2014 08:41    Scheduled Meds: . allopurinol  100 mg Oral BID  . antiseptic oral rinse  7 mL Mouth Rinse q12n4p  . atorvastatin  20 mg Oral Daily  . azithromycin  500 mg Intravenous Q24H  . cefTRIAXone (ROCEPHIN)  IV  1 g Intravenous Q24H  . chlorhexidine  15 mL Mouth Rinse BID  . donepezil  10 mg  Oral QHS  . insulin aspart  0-9 Units Subcutaneous 6 times per day  . loratadine  10 mg Oral Daily  . pantoprazole (PROTONIX) IV  40 mg Intravenous Q12H  . risperiDONE  0.5 mg Oral BID  . sertraline  50 mg Oral Daily   Continuous Infusions:      Time spent: 35 minutes    DHUNGEL, NISHANT  [facility] Pager [phone] If 7PM-7AM, please contact night-coverage at [email or web address], password [facility] 11/29/2014, 10:43 AM  LOS: 1 day

## 2014-11-29 NOTE — Progress Notes (Signed)
OT Cancellation Note  Patient Details Name: [name] MRN: [id number] DOB: 22-Nov-1917   Cancelled Treatment:    Reason Eval/Treat Not Completed: Patient not medically ready Pt has active bedrest orders. Please update activity orders when appropriate for therapy. Thanks [facility] Ward, OTR/L  [phone] 11/29/2014 11/29/2014, 7:56 AM

## 2014-11-29 NOTE — ED Notes (Signed)
This rn during blood administration, documenting neuro check and pt not responding as well as before and lethargic. Not following commands, blood sugar checked and found to be 42 and dr Clyde Lundborg made aware and pt given 25 mg of d 50

## 2014-11-29 NOTE — Progress Notes (Signed)
Utilization Review Completed.[name] T9/26/2016  

## 2014-11-30 DIAGNOSIS — R34 Anuria and oliguria: Secondary | ICD-10-CM

## 2014-11-30 DIAGNOSIS — J189 Pneumonia, unspecified organism: Secondary | ICD-10-CM

## 2014-11-30 LAB — GLUCOSE, CAPILLARY
GLUCOSE-CAPILLARY: 117 mg/dL — AB (ref 65–99)
GLUCOSE-CAPILLARY: 108 mg/dL — AB (ref 65–99)
GLUCOSE-CAPILLARY: 85 mg/dL (ref 65–99)
GLUCOSE-CAPILLARY: 94 mg/dL (ref 65–99)
Glucose-Capillary: 142 mg/dL — ABNORMAL HIGH (ref 65–99)
Glucose-Capillary: 56 mg/dL — ABNORMAL LOW (ref 65–99)
Glucose-Capillary: 79 mg/dL (ref 65–99)
Glucose-Capillary: 88 mg/dL (ref 65–99)

## 2014-11-30 LAB — CBC
HEMATOCRIT: 27.3 % — AB (ref 36.0–46.0)
Hemoglobin: 8.5 g/dL — ABNORMAL LOW (ref 12.0–15.0)
MCH: 27.8 pg (ref 26.0–34.0)
MCHC: 31.1 g/dL (ref 30.0–36.0)
MCV: 89.2 fL (ref 78.0–100.0)
PLATELETS: 258 10*3/uL (ref 150–400)
RBC: 3.06 MIL/uL — ABNORMAL LOW (ref 3.87–5.11)
RDW: 16.4 % — AB (ref 11.5–15.5)
WBC: 12.1 10*3/uL — ABNORMAL HIGH (ref 4.0–10.5)

## 2014-11-30 LAB — PREPARE PLATELET PHERESIS
UNIT DIVISION: 0
UNIT DIVISION: 0

## 2014-11-30 MED ORDER — FUROSEMIDE 10 MG/ML IJ SOLN
80.0000 mg | Freq: Once | INTRAMUSCULAR | Status: AC
  Administered 2014-11-30: 80 mg via INTRAVENOUS
  Filled 2014-11-30: qty 8

## 2014-11-30 MED ORDER — ALBUTEROL SULFATE (2.5 MG/3ML) 0.083% IN NEBU: 2.5000 mg | INHALATION_SOLUTION | Freq: Four times a day (QID) | RESPIRATORY_TRACT | Status: DC | PRN

## 2014-11-30 MED ORDER — DEXTROSE 50 % IV SOLN
INTRAVENOUS | Status: AC
  Administered 2014-11-30: 25 mL
  Filled 2014-11-30: qty 50

## 2014-11-30 MED ORDER — SCOPOLAMINE 1 MG/3DAYS TD PT72
1.0000 | MEDICATED_PATCH | TRANSDERMAL | Status: DC
  Administered 2014-11-30: 1.5 mg via TRANSDERMAL
  Filled 2014-11-30: qty 1

## 2014-11-30 MED ORDER — MORPHINE SULFATE (PF) 2 MG/ML IV SOLN
0.5000 mg | INTRAVENOUS | Status: DC | PRN
  Administered 2014-12-01: 0.5 mg via INTRAVENOUS
  Filled 2014-11-30: qty 1

## 2014-11-30 MED ORDER — PANTOPRAZOLE SODIUM 40 MG IV SOLR
40.0000 mg | INTRAVENOUS | Status: DC
  Administered 2014-12-01: 40 mg via INTRAVENOUS
  Filled 2014-11-30: qty 40

## 2014-11-30 NOTE — Progress Notes (Signed)
OT Cancellation Note  Patient Details Name: [name] MRN: [id number] DOB: 02/01/1918   Cancelled Treatment:    Reason Eval/Treat Not Completed:  Pt with minimal responsiveness, on non rebreather and remains on strict bedrest. Plan is for pt to return home with hospice care. Signing off.  Please reorder as appropriate.  Evern Bio 11/30/2014, 9:14 AM  [phone]

## 2014-11-30 NOTE — Clinical Documentation Improvement (Signed)
Internal Medicine  Gastroenterology  Can the diagnosis of anemia be further specified?  Acute blood loss anemia  Acute blood loss anemia on chronic anemia (please specify suspected or known cause of chronic anemia)  Iron deficiency Anemia  Nutritional anemia, including the nutrition or mineral deficits  Chronic Anemia, including the suspected or known cause  Anemia of chronic disease, including the associated chronic disease state  Other  Clinically Undetermined  Document any associated diagnoses/conditions.  Supporting Information: GI bleed with symptomatic anemia currently documented.   Component      Hemoglobin HCT  Latest Ref Rng      12.0 - 15.0 g/dL 19.1 - 47.8 %  2/95/6213     7:57 PM 11.0 (L) 36.0  11/29/2014     12:37 AM 8.0 (L) 26.6 (L)  11/29/2014     7:54 AM 10.2 (L) 33.0 (L)  11/29/2014     2:25 PM 8.9 (L) 29.6 (L)  11/29/2014     3:54 PM 8.6 (L) 27.8 (L)  11/29/2014     11:30 PM 8.6 (L) 28.2 (L)  11/30/2014     4:41 AM 8.5 (L) 27.3 (L)     Please exercise your independent, professional judgment when responding. A specific answer is not anticipated or expected.  Thank you, Doy Mince, RN [phone] Clinical Documentation Specialist

## 2014-11-30 NOTE — Progress Notes (Addendum)
Hypoglycemic Event  CBG: 56  Treatment: 25 ml Dextrose 50%  Symptoms: Lethargy  Follow-up CBG: [id number] CBG Result:117  Possible Reasons for Event: Poor oral intake  Comments/MD notified: Yes    TEPE, AYABA [place]  Remember to initiate Hypoglycemia Order Set & complete

## 2014-11-30 NOTE — Progress Notes (Signed)
Inpatient [facility] Rm 3 S 10 [facility] RN visit. GIP related admission to [facility] Dx: Alzheimer's Dementia, Pt. Is a DNR code status. Pt. Seen briefly in room with non-rebreather mask on.No family present in the room. Pt's respirations regular. Staff nurse reports pt. will be transferred to the floor today but room # has not been assigned. She reports pt. opens her eyes but does not respond in any other way. TC to [facility] LCSW to update on patient. [facility] will continue to follow daily. Please call with any questions.  Sharen Heck RN  [facility] Liaison [phone]

## 2014-11-30 NOTE — Progress Notes (Addendum)
[facility] PROGRESS NOTE  [name] ZOX:[id number] DOB: 1917-12-09 DOA: 11/28/2014 PCP: [name], MD  Brief narrative 81 year old female with history of hypertension, hyperlipidemia, diabetes mellitus, depression, carotid artery stenosis, history of stroke with left facial droop and sided weakness, chronic kidney disease stage III, diastolic CHF, dementia, GERD, who presented with dark stools (total 5 episodes) without associated nausea, vomiting, abdominal pain, chest pain, shortness of breath, fevers or chills. On presentation she was hypotensive with systolic blood pressure in the 80s, hemoglobin of 11 (around baseline), lactic acid 4, bradycardic. [facility] consulted from the ED. Subsequent hemoglobin dropped to 8. Patient transfused 1 unit PRBC and 2 units of platelets and  admitted to stepdown.   Assessment/Plan:   Acute hypoxic respiratory failure Secondary to  underlying pneumonia and volume overload in the setting of IV fluids and transfusions. On NRB with increased secretions. Has poor mental status. Oliguria despite fluids and lasix.  Discussed with radiologist on the phone who recommends that could be some mediastinal widening and follow-up with contrast chest CT. Patient hemodynamically stable at this time and will not pursue further imaging. Has poor overall prognosis. Will plan for full comfort. Discussed with daughter on the phone and explained. She understands pts poor prognosis and agrees for comfort measures.  transfer to medical floor. continue NRB for now. No further blood draws. Prn IV morphine for comfort and SOB. Scopolamine patch. Prn lasix for comfort. consult palliative care for symptomatic management.   GI bleed with symptomatic anemia Drop in hemoglobin to 8. Received one unit PRBC and 2 units of platelets??. Hydrated in ED with NS.  IV PPI. Serial H&H after transfusion without further drop. Not a candidate for procedure or interventions given her  resp status.     Community acquired pneumonia/aspiration pneumonia As outlined on chest x-ray.  on empiric Rocephin and azithromycin.  Chronic kidney disease Stable  Chronic diastolic CHF Presented with hypotension but developed fluid overload. Order another dose of IV lasix and see for improvement.  Type 2 diabetes mellitus CBG of 42 this morning. Discontinue her home insulin. Monitor  History of stroke Aspirin on hold.   Diet: Nothing by mouth DVT prophylaxis: SCDs  Code Status: DO NOT RESUSCITATE, pt on home hospice. Full comfort  Family Communication: Discussed with daughter Dois Davenport on the phone, she is undecided at this time if she wants pt to come home or go to residential hospice  Disposition Plan: poor prognosis. tx to medical floor for comfort   Consultants:  [facility]  Procedures:  None  Antibiotics:  IV Rocephin and azithromycin  HPI/Subjective: Remains SOB on NRB wit increase secretions and grunting. Low BP with poor UOP.  Objective: Filed Vitals:   11/30/14 0745  BP:   Pulse:   Temp: 98.8 F (37.1 C)  Resp:     Intake/Output Summary (Last 24 hours) at 11/30/14 0911 Last data filed at 11/30/14 0725  Gross per 24 hour  Intake    300 ml  Output    190 ml  Net    110 ml   Filed Weights   11/29/14 0300  Weight: 63.9 kg (140 lb 14 oz)    Exam:   General: Elderly female lying in bed on nonrebreather, poorly responsive with eye opening only, grunting respirations  HEENT: Poor eye opening, on nonrebreather  Chest: Coarse breath sounds bilaterally   CVS: S1 and S2 normal, no murmurs rub or gallop  GI: Soft, nondistended, nontender, bowel sounds present  Musculoskeletal: Warm, no  edema  CNS: Awake to commands with eye opening only , non oriented    : Data Reviewed: Basic Metabolic Panel:  Recent Labs Lab 11/28/14 1957 11/29/14 0754  NA 145 146*  K 3.8 3.8  CL 106 109  CO2 31 29  GLUCOSE 80 109*  BUN 14 13  CREATININE  0.93 0.80  CALCIUM 8.5* 8.1*   Liver Function Tests:  Recent Labs Lab 11/28/14 1957  AST 12*  ALT 8*  ALKPHOS 57  BILITOT 0.3  PROT 6.1*  ALBUMIN 2.3*   No results for input(s): LIPASE, AMYLASE in the last 168 hours. No results for input(s): AMMONIA in the last 168 hours. CBC:  Recent Labs Lab 11/28/14 1957  11/29/14 0754 11/29/14 1425 11/29/14 1554 11/29/14 2330 11/30/14 0441  WBC 5.6  < > 15.5* 15.8* 17.2* 14.0* 12.1*  NEUTROABS 3.8  --   --   --   --   --   --   HGB 11.0*  < > 10.2* 8.9* 8.6* 8.6* 8.5*  HCT 36.0  < > 33.0* 29.6* 27.8* 28.2* 27.3*  MCV 89.6  < > 90.7 90.2 90.3 90.1 89.2  PLT 219  < > 289 274 266 273 258  < > = values in this interval not displayed. Cardiac Enzymes: No results for input(s): CKTOTAL, CKMB, CKMBINDEX, TROPONINI in the last 168 hours. BNP (last 3 results)  Recent Labs  05/18/14 2056 08/13/14 1230 11/29/14 0754  BNP 83.8 158.9* 129.6*    ProBNP (last 3 results) No results for input(s): PROBNP in the last 8760 hours.  CBG:  Recent Labs Lab 11/29/14 1935 11/29/14 2343 11/30/14 0345 11/30/14 0802 11/30/14 0834  GLUCAP 107* 88 79 56* 117*    Recent Results (from the past 240 hour(s))  MRSA PCR Screening     Status: None   Collection Time: 11/29/14  2:44 AM  Result Value Ref Range Status   MRSA by PCR NEGATIVE NEGATIVE Final    Comment:        The GeneXpert MRSA Assay (FDA approved for NASAL specimens only), is one component of a comprehensive MRSA colonization surveillance program. It is not intended to diagnose MRSA infection nor to guide or monitor treatment for MRSA infections.      Studies: Dg Chest Port 1 View  11/29/2014   ADDENDUM REPORT: 11/29/2014 09:56 ADDENDUM: These results were called by telephone at the time of interpretation on 11/29/2014 at 9:20 am to Dr. Eddie North , who verbally acknowledged these results. Electronically Signed   By: Elberta Fortis M.D.   On: 11/29/2014 09:56  11/29/2014    CLINICAL DATA:  Gurgling sounds in throat.  EXAM: PORTABLE CHEST 1 VIEW  COMPARISON:  10/16/2014  FINDINGS: Lungs are hypoinflated with opacification over the left perihilar region left upper lung with obscuration of the left lateral border of the aortic arch. Mild opacification in the left retrocardiac region. Calcified AP window lymph node unchanged. No evidence of effusion or pneumothorax. Mild stable cardiomegaly. Calcified plaque over the aortic arch unchanged. Remainder of the exam is unchanged.  IMPRESSION: Opacification over left perihilar region and left upper lung with obscuration left lateral border of the aortic arch. Mild left retrocardiac opacification. Findings likely due to pneumonia, however would recommend contrast-enhanced chest CT to exclude acute aortic process.  Mild stable cardiomegaly.  Electronically Signed: By: Elberta Fortis M.D. On: 11/29/2014 08:41    Scheduled Meds: . allopurinol  100 mg Oral BID  . antiseptic oral rinse  7  mL Mouth Rinse q12n4p  . atorvastatin  20 mg Oral Daily  . azithromycin  500 mg Intravenous Q24H  . cefTRIAXone (ROCEPHIN)  IV  1 g Intravenous Q24H  . chlorhexidine  15 mL Mouth Rinse BID  . donepezil  10 mg Oral QHS  . insulin aspart  0-9 Units Subcutaneous 6 times per day  . loratadine  10 mg Oral Daily  . pantoprazole (PROTONIX) IV  40 mg Intravenous Q12H  . risperiDONE  0.5 mg Oral BID  . sertraline  50 mg Oral Daily   Continuous Infusions:      Time spent: 25 minutes    Eddie North  [facility] Pager [phone] If 7PM-7AM, please contact night-coverage at [email or web address], password [email or web address] 11/30/2014, 9:11 AM  LOS: 2 days

## 2014-11-30 NOTE — Progress Notes (Signed)
Pt transferred to 6N08. Daughter Dois Davenport updated.

## 2014-11-30 NOTE — Progress Notes (Signed)
PT Cancellation Note  Patient Details Name: [name] MacphersonRN: [id number] DOB: August 04, 1917   Cancelled Treatment:    Reason Eval/Treat Not Completed: Medical issues which prohibited therapy.  Pt with minimal responsiveness, on non rebreather and remains on strict bedrest. Plan is for pt to return home with hospice care. Signing off. Please reorder as appropriate. 11/30/2014  [place] Bing, PT [phone]  (pager)    Tekesha Almgren, Eliseo Gum 11/30/2014, 10:24 AM

## 2014-12-01 DIAGNOSIS — Z7189 Other specified counseling: Secondary | ICD-10-CM

## 2014-12-01 DIAGNOSIS — L8990 Pressure ulcer of unspecified site, unspecified stage: Secondary | ICD-10-CM

## 2014-12-01 DIAGNOSIS — Z515 Encounter for palliative care: Secondary | ICD-10-CM

## 2014-12-01 LAB — GLUCOSE, CAPILLARY
GLUCOSE-CAPILLARY: 95 mg/dL (ref 65–99)
GLUCOSE-CAPILLARY: 95 mg/dL (ref 65–99)
GLUCOSE-CAPILLARY: 123 mg/dL — AB (ref 65–99)
GLUCOSE-CAPILLARY: 116 mg/dL — AB (ref 65–99)
Glucose-Capillary: 90 mg/dL (ref 65–99)

## 2014-12-01 MED ORDER — SODIUM CHLORIDE 0.9 % IJ SOLN: 3.0000 mL | INTRAMUSCULAR | Status: DC | PRN

## 2014-12-01 MED ORDER — LEVOFLOXACIN 25 MG/ML PO SOLN: 500.0000 mg | Freq: Every day | ORAL | Status: DC

## 2014-12-01 MED ORDER — ONDANSETRON HCL 4 MG/2ML IJ SOLN: 4.0000 mg | Freq: Four times a day (QID) | INTRAMUSCULAR | Status: DC | PRN

## 2014-12-01 MED ORDER — HALOPERIDOL LACTATE 2 MG/ML PO CONC
0.5000 mg | ORAL | Status: DC | PRN
  Filled 2014-12-01: qty 0.3

## 2014-12-01 MED ORDER — HALOPERIDOL LACTATE 5 MG/ML IJ SOLN
0.5000 mg | INTRAMUSCULAR | Status: DC | PRN
Start: 2014-12-01 — End: 2014-12-02

## 2014-12-01 MED ORDER — HALOPERIDOL 1 MG PO TABS: 0.5000 mg | ORAL_TABLET | ORAL | Status: DC | PRN

## 2014-12-01 MED ORDER — BISACODYL 10 MG RE SUPP
10.0000 mg | Freq: Every day | RECTAL | Status: DC | PRN
Start: 2014-12-01 — End: 2014-12-02

## 2014-12-01 MED ORDER — LORAZEPAM 1 MG PO TABS: 1.0000 mg | ORAL_TABLET | ORAL | Status: DC | PRN

## 2014-12-01 MED ORDER — GLYCOPYRROLATE 0.2 MG/ML IJ SOLN: 0.2000 mg | INTRAMUSCULAR | Status: DC | PRN

## 2014-12-01 MED ORDER — SODIUM CHLORIDE 0.9 % IJ SOLN
3.0000 mL | Freq: Two times a day (BID) | INTRAMUSCULAR | Status: DC
  Administered 2014-12-01 – 2014-12-02 (×3): 3 mL via INTRAVENOUS

## 2014-12-01 MED ORDER — POLYVINYL ALCOHOL 1.4 % OP SOLN
1.0000 [drp] | Freq: Four times a day (QID) | OPHTHALMIC | Status: DC | PRN
  Filled 2014-12-01: qty 15

## 2014-12-01 MED ORDER — SODIUM CHLORIDE 0.9 % IV SOLN: 250.0000 mL | INTRAVENOUS | Status: DC | PRN

## 2014-12-01 MED ORDER — BIOTENE DRY MOUTH MT LIQD: 15.0000 mL | OROMUCOSAL | Status: DC | PRN

## 2014-12-01 MED ORDER — LORAZEPAM 2 MG/ML PO CONC: 1.0000 mg | ORAL | Status: DC | PRN

## 2014-12-01 MED ORDER — GLYCOPYRROLATE 1 MG PO TABS
1.0000 mg | ORAL_TABLET | ORAL | Status: DC | PRN
  Filled 2014-12-01: qty 1

## 2014-12-01 MED ORDER — LORAZEPAM 2 MG/ML IJ SOLN: 1.0000 mg | INTRAMUSCULAR | Status: DC | PRN

## 2014-12-01 MED ORDER — MORPHINE SULFATE (CONCENTRATE) 10 MG/0.5ML PO SOLN: 5.0000 mg | ORAL | Status: DC | PRN

## 2014-12-01 MED ORDER — ONDANSETRON 4 MG PO TBDP: 4.0000 mg | ORAL_TABLET | Freq: Four times a day (QID) | ORAL | Status: DC | PRN

## 2014-12-01 NOTE — Progress Notes (Signed)
Home Care SW made visit with Patient, Daughter [name], Palliative Care MD, and Home Care RN for Goals of Care meeting.  Patient was in bed and would open her eyes when SW touched her arm but she closed them almost immediately.  [name] asked MD about feeding and he, RN, and SW provided education on food and nutrition at end of life and how deciding what kind of care to provide right now was the big question.  SW provided education that the food she takes in can go into her lungs if she is not alert enough to swallow and MD talked about comfort medications vs continuing on antibiotics.  [name] stated she wants her mother to be comfortable and while she would like her to be at home she is not sure she has confidence in the people who are there to take care of her when she cannot be there.  She decided she would like her mother to go to [facility].  SW called and spoke to [facility] who stated there was a bed for her tomorrow.  SW let Hospice RN [place], and Palliative Care MD know of bed offer for tomorrow.  SW will follow up for transition visit later in the week after she gets to [facility].  Maretta Los, LCSW [facility] [phone]

## 2014-12-01 NOTE — Progress Notes (Addendum)
Inpatient [facility] Rm 6N 08 [facility] RN visit. GIP related admission to [facility] Dx: Alzheimer's Dementia, Pt. Is a DNR code status. Pt. lying in bed with her eyes open. Pt. Asked if she could squeeze writer's had and pt. replied no. Pt. Did not respond to any more questions. No spontaneous movement observed. Skin warm and dry. Pt. advised [facility] LCSW [place] and her daughter will be in around 115 pm to visit. Pt. remains NPO. O2 now at 2 L n/c. Resps regular and unlabored. [facility] will continue to follow daily. Please call with any questions.  Sharen Heck RN [facility] Liaison [phone]

## 2014-12-01 NOTE — Progress Notes (Signed)
I met with [name] (patient's daughter), [name] ([place] social worker), and Engineer, structural ([facility] home care RN).    We discussed Ms. [name]'s clinical course to this point in time. [name] reports that she would like to focus on her mother's comfort, but she has been having concerns about not feeding her mother. We discussed comfort care and nutrition at the end-of-life (including allowing her mother to eat and drink as she desired but not forcing foods), and her daughter agrees that his best plan moving forward. She was in agreement with discontinuing medications and interventions not specifically focused on her mother's comfort.  We talked about places that this care could occur including at home with hospice versus a residential hospice facility such as [facility]. While [name] reports she would want to take her mother home, she is concerned that she will not be able to give the level of care that her mother requires and would like to pursue placement at [facility].  [facility] will have bed available tomorrow. I informed the attending hospitalist. I also placed referred to social work to let them know the plan is to go to [facility].  I also updated patient's care regimen to focus on comfort medications.  Micheline Rough, MD [place] Team [phone]

## 2014-12-01 NOTE — Progress Notes (Addendum)
[facility] PROGRESS NOTE  [name] UJW:[id number] DOB: [birth year] DOA: 11/28/2014 PCP: [name], MD  Brief narrative 81 year old female with history of hypertension, hyperlipidemia, diabetes mellitus, depression, carotid artery stenosis, history of stroke with left facial droop and sided weakness, chronic kidney disease stage III, diastolic CHF, dementia, GERD, who presented with dark stools (total 5 episodes) without associated nausea, vomiting, abdominal pain, chest pain, shortness of breath, fevers or chills. On presentation she was hypotensive with systolic blood pressure in the 80s, hemoglobin of 11 (around baseline), lactic acid 4, bradycardic. [facility] consulted from the ED. Subsequent hemoglobin dropped to 8. Patient transfused 1 unit PRBC and 2 units of platelets and  admitted to stepdown.   Assessment/Plan:   Acute hypoxic respiratory failure Secondary to underlying aspiration PNA and volume overload in the setting of IV fluids and transfusions. On NRB with increased secretions.  Oliguria despite fluids and lasix.   Discussed with radiologist on the phone who recommends that could be some mediastinal widening and follow-up with contrast chest CT.  Patient hemodynamically stable at this time and will not pursue further imaging. Has poor overall prognosis.  Will plan for full comfort.  Discussed with daughter on the phone and explained.  She understands pts poor prognosis and agrees for comfort measures.  transfer to medical floor.  continue NRB for now. No further blood draws.  Prn IV morphine for comfort and SOB.  Scopolamine patch. Prn lasix for comfort. consult palliative care for symptomatic management. Will be hopefully d/c to Hospice free-standing   GI bleed with symptomatic acute blood loss anemia Drop in hemoglobin to 8. Received one unit PRBC and 2 units of platelets?? Hydrated in ED with NS.  IV PPI.  Serial H&H after transfusion without  further drop.  Not a candidate for procedure or interventions given her resp status.   Aspiration pneumonia As outlined on chest x-ray.  on empiric Rocephin and azithromycin. - i have d/c Abx 9/28  Chronic kidney disease Stable  Chronic diastolic CHF Presented with hypotension but developed fluid overload.  No further work-up  Type 2 diabetes mellitus CBG of 42 this morning. Discontinue her home insulin. Monitor  History of stroke Aspirin on hold.   Diet: Nothing by mouth DVT prophylaxis: SCDs  Code Status: DO NOT RESUSCITATE, pt on home hospice. Full comfort  Family Communication: Discussed with palliative medicine team.  Aim for d/c to Free-standing Hopsice in am  Disposition Plan: poor prognosis. tx to medical floor for comfort   Consultants:  [facility]  Procedures:  None  Antibiotics:  IV Rocephin and azithromycin  HPI/Subjective:  Awake but disoriented Not able to verbalise   Objective: Filed Vitals:   12/01/14 1500  BP: 116/68  Pulse: 75  Temp: 98.8 F (37.1 C)  Resp: 23    Intake/Output Summary (Last 24 hours) at 12/01/14 1524 Last data filed at 12/01/14 1500  Gross per 24 hour  Intake      0 ml  Output   1425 ml  Net  -1425 ml   Filed Weights   11/29/14 0300  Weight: 63.9 kg (140 lb 14 oz)    Exam:   General: Elderly female lying in bed on nonrebreather, poorly responsive with eye opening only, grunting respirations  HEENT: Poor eye opening, on nonrebreather  Chest: Coarse breath sounds bilaterally   CVS: S1 and S2 normal, no murmurs rub or gallop  GI: Soft, nondistended, nontender, bowel sounds present   : Data Reviewed: Basic Metabolic Panel:  Recent Labs Lab 11/28/14 1957 11/29/14 0754  NA 145 146*  K 3.8 3.8  CL 106 109  CO2 31 29  GLUCOSE 80 109*  BUN 14 13  CREATININE 0.93 0.80  CALCIUM 8.5* 8.1*   Liver Function Tests:  Recent Labs Lab 11/28/14 1957  AST 12*  ALT 8*  ALKPHOS 57  BILITOT 0.3   PROT 6.1*  ALBUMIN 2.3*   No results for input(s): LIPASE, AMYLASE in the last 168 hours. No results for input(s): AMMONIA in the last 168 hours. CBC:  Recent Labs Lab 11/28/14 1957  11/29/14 0754 11/29/14 1425 11/29/14 1554 11/29/14 2330 11/30/14 0441  WBC 5.6  < > 15.5* 15.8* 17.2* 14.0* 12.1*  NEUTROABS 3.8  --   --   --   --   --   --   HGB 11.0*  < > 10.2* 8.9* 8.6* 8.6* 8.5*  HCT 36.0  < > 33.0* 29.6* 27.8* 28.2* 27.3*  MCV 89.6  < > 90.7 90.2 90.3 90.1 89.2  PLT 219  < > 289 274 266 273 258  < > = values in this interval not displayed. Cardiac Enzymes: No results for input(s): CKTOTAL, CKMB, CKMBINDEX, TROPONINI in the last 168 hours. BNP (last 3 results)  Recent Labs  05/18/14 2056 08/13/14 1230 11/29/14 0754  BNP 83.8 158.9* 129.6*    ProBNP (last 3 results) No results for input(s): PROBNP in the last 8760 hours.  CBG:  Recent Labs Lab 11/30/14 1959 11/30/14 2325 12/01/14 0343 12/01/14 0805 12/01/14 1213  GLUCAP 85 94 95 90 95    Recent Results (from the past 240 hour(s))  MRSA PCR Screening     Status: None   Collection Time: 11/29/14  2:44 AM  Result Value Ref Range Status   MRSA by PCR NEGATIVE NEGATIVE Final    Comment:        The GeneXpert MRSA Assay (FDA approved for NASAL specimens only), is one component of a comprehensive MRSA colonization surveillance program. It is not intended to diagnose MRSA infection nor to guide or monitor treatment for MRSA infections.      Studies: No results found.  Scheduled Meds: . scopolamine  1 patch Transdermal Q72H  . sodium chloride  3 mL Intravenous Q12H   Continuous Infusions:    Time spent: 25 minutes  Pleas Koch, MD [facility] [phone]

## 2014-12-01 NOTE — Consult Note (Addendum)
Consultation Note Date: 12/01/2014   Patient Name: [name]  DOB: [birth year]  MRN: [id number]  Age / Sex: 81 y.o., female   PCP: Fleet Contras, MD Referring Physician: Rhetta Mura, MD  Reason for Consultation: Terminal care  Palliative Care Assessment and Plan Summary of Established Goals of Care and Medical Treatment Preferences   Clinical Assessment/Narrative: 82 year old female with history of hypertension, hyperlipidemia, diabetes mellitus, depression, carotid artery stenosis, history of stroke with left facial droop and left sided weakness, chronic kidney disease stage III, diastolic CHF, dementia, GERD, who presented with dark stools (total 5 episodes).  Her hospital course has been complicated by acute hypoxic respiratory failure, CAP versus aspiration pneumonia, and anemia related to her GI bleed. Most recent hemoglobin was stable at 8.5. Review of chart reveals plan for comfort care only. Palliative consulted to ensure good symptom management with plan for eventual home with hospice versus residential hospice placement.  I saw [name] this morning. She does open her eyes to verbal or tactile stimulation but is nonverbal. She does not follow commands and is unable to indicate answers to any questions or provide any history.  I called spoke with her daughter, [name]. She reports that she has been working with [facility] and has been very happy with the care her mother has been receiving with them. She has developed a good relationship with the social worker, [name], and is planning to meet with her this afternoon around 1:15.  I asked her to review with me her understanding of her mother's situation at this time. She was able to report to me that her mother was not doing well and was having respiratory difficulty in addition to her GI bleeding. I asked what is most important to her mother moving forward and she reported that comfort is what she would want. She then began ask  about her mother's most recent lab work and plans for artificial nutrition (including placement of feeding tube).   I advised her that I would recommend we do not do any further lab work as this would not influence our treatment plan moving forward if we are truly focusing on only things that add to her mother's comfort.  I also discussed with her the use of feeding tubes and artificial nutrition at the end-of-life and how this would add more burden with no significant benefit. I recommended that we begin giving her intake as requested as comfort feeds.   We talked about the fact that her mother is approaching the end of her life and that this is something that will not likely change despite any medical therapy that we could offer. I advised her that we can probably best serve her mother by working on a plan to ensure that the time she has left is focused on her comfort and is spent in a place that she would want to be at the end of her life. We talked briefly about home hospice versus residential hospice facility.  - [name], [name], reports that she will need to discuss further with hospice social worker prior to making any other changes to her mother's care moving forward.  Her mother is currently comfort care but still receiving antibiotics. She has no nutritional intake which is been a big concern for [name]. I told her that my recommendations based upon stated goal of comfort would be to discontinue antibiotics, not do any further lab work, and allow her mother to eat as she desires for comfort. - The Child psychotherapist  from [facility], [name], is meeting with the patient's daughter at 1:15 this afternoon. I will plan on attending as well in order to assist with goals moving forward if this will be of benefit.  Contacts/Participants in Discussion: Primary Decision Maker: [name], [name] Person  HCPOA: None on chart  Code Status/Advance Care Planning:  DO NOT RESUSCITATE  Symptom  Management:   Dyspnea: Patient currently ordered morphine on as-needed basis. She received 1 dose this morning and is resting comfortably at time of my examination. I spoke with her daughter this morning, and she would like to hold on changing any medications until she has had a chance to meet with hospice social worker this afternoon. The patient appears comfortable at this time. I would recommend addition of Roxanol 5 mg PO every 3 hours when necessary for shortness of breath if she is going to be discharged home as this will be her likely home regimen.   Excess secretions: Appears to be improved today compared to documentation from yesterday. Would continue scopolamine patch.   Anxiety: Does not appear be an issue for patient at this time. Recommend addition of lorazepam on as-needed basis, but will hold at this time per daughter's request to meet with hospice social worker prior to making any medication changes.  Palliative Prophylaxis: Recommend Dulcolax suppository as needed on discharge   Psycho-social/Spiritual:   Support System: Patients daughter  Desire for further Chaplaincy support:no  Prognosis: < 2 weeks. [name] is admitted with acute respiratory failure, pneumonia, GI bleed (Hgb stable last check). She has been noted to be comfort care only by attending yesterday, however she remains on antibiotics. She has no significant nutritional intake at this time. Even with continued aggressive care her likely prognosis would be less than 2 weeks based upon comorbid conditions and poor nutritional status. If antibiotics are stopped, this would likely be an even shorter period of time. I did discuss discontinuing antibiotics, SCDs, and insulin with her daughter this morning, however she wants to meet with social worker from hospice prior to making any other changes to her mother's medical regimen.   Discharge Planning:  To be determined. Patient to meet with social worker from hospice  agency after lunch. I briefly discussed options for discharge of home with hospice versus residential hospice facility.       Chief Complaint/History of Present Illness:  81 year old female with history of hypertension, hyperlipidemia, diabetes mellitus, depression, carotid artery stenosis, history of stroke with left facial droop and sided weakness, chronic kidney disease stage III, diastolic CHF, dementia, GERD, who presented with dark stools (total 5 episodes). She is a home hospice patient with [facility].  Primary Diagnoses  Present on Admission:  . Pulmonary hypertension . Prolonged QT interval . Dyslipidemia . Diabetic neuropathy . Diabetes type 2, uncontrolled . CKD (chronic kidney disease) stage 3, GFR 30-59 ml/min . Chronic diastolic heart failure, NYHA class 1 . GIB (gastrointestinal bleeding)  Palliative Review of Systems: Patient is nonverbal I have reviewed the medical record, interviewed the patient and family, and examined the patient. The following aspects are pertinent.  Past Medical History  Diagnosis Date  . UTI (urinary tract infection)   . Hypertension   . Diabetes mellitus   . Gout   . High cholesterol   . Complication of anesthesia   . Diabetic neuropathy   . PVD (peripheral vascular disease)   . Carotid artery occlusion   . Arthritis   . Chronic kidney disease   . GERD (gastroesophageal  reflux disease)   . Vertigo   . Venous insufficiency   . Mood disorder   . Dysphagia   . Hemiparesis and alteration of sensations as late effects of stroke 12/09/2013   Social History   Social History  . Marital Status: Widowed    Spouse Name: N/A  . Number of Children: 4  . Years of Education: unknown   Occupational History  . Retired    Social History Main Topics  . Smoking status: Never Smoker   . Smokeless tobacco: Never Used  . Alcohol Use: No  . Drug Use: No  . Sexual Activity: No   Other Topics Concern  . None   Social History Narrative    Family History  Problem Relation Age of Onset  . Heart disease Daughter   . Hypertension Daughter   . Diabetes Son   . Heart disease Son   . Diabetes Father   . Diabetes Sister   . Diabetes Sister    Scheduled Meds: . antiseptic oral rinse  7 mL Mouth Rinse q12n4p  . azithromycin  500 mg Intravenous Q24H  . cefTRIAXone (ROCEPHIN)  IV  1 g Intravenous Q24H  . chlorhexidine  15 mL Mouth Rinse BID  . insulin aspart  0-9 Units Subcutaneous 6 times per day  . pantoprazole (PROTONIX) IV  40 mg Intravenous Q24H  . scopolamine  1 patch Transdermal Q72H   Continuous Infusions:  PRN Meds:.acetaminophen **OR** acetaminophen, albuterol, dextrose, hydrOXYzine, morphine injection Medications Prior to Admission:  Prior to Admission medications   Medication Sig Start Date End Date Taking? Authorizing Provider  albuterol (PROVENTIL HFA;VENTOLIN HFA) 108 (90 BASE) MCG/ACT inhaler Inhale 2 puffs into the lungs every 6 (six) hours as needed for wheezing or shortness of breath.   Yes Historical Provider, MD  allopurinol (ZYLOPRIM) 100 MG tablet Take 100 mg by mouth 2 (two) times daily.   Yes Historical Provider, MD  aspirin EC 81 MG EC tablet Take 1 tablet (81 mg total) by mouth daily. 11/26/11  Yes Catarina Hartshorn, MD  atorvastatin (LIPITOR) 20 MG tablet TAKE 1 TABLET BY MOUTH EVERY DAY 01/18/14  Yes Margit Hanks, MD  cetirizine (ZYRTEC) 10 MG tablet Take 10 mg by mouth daily.   Yes Historical Provider, MD  donepezil (ARICEPT) 10 MG tablet Take 10 mg by mouth at bedtime.    Yes Historical Provider, MD  furosemide (LASIX) 20 MG tablet Take 20 mg by mouth daily as needed for fluid.    Yes Historical Provider, MD  gabapentin (NEURONTIN) 300 MG capsule Take 300 mg by mouth 3 (three) times daily.    Yes Historical Provider, MD  insulin lispro protamine-lispro (HUMALOG 75/25 MIX) (75-25) 100 UNIT/ML SUSP injection 15 units in am and 5 units in pm Patient taking differently: 5-20 Units. 20 units in am and 5  units in pm 10/18/14  Yes Lonia Blood, MD  Maltodextrin-Xanthan Gum (RESOURCE THICKENUP CLEAR) POWD Take 1 g by mouth as needed (Per diet order.). 08/16/14  Yes Jeralyn Bennett, MD  Multiple Vitamin (MULTIVITAMIN WITH MINERALS) TABS tablet Take 1 tablet by mouth daily. 05/21/14  Yes Christiane Ha, MD  risperiDONE (RISPERDAL) 0.5 MG tablet Take 0.5 mg by mouth 2 (two) times daily. For hallucinations   Yes Historical Provider, MD  sertraline (ZOLOFT) 50 MG tablet Take 50 mg by mouth daily.   Yes Historical Provider, MD  Wheat Dextrin (BENEFIBER) CHEW Chew 1 tablet by mouth daily.   Yes Historical Provider, MD  acetaminophen (TYLENOL) 325 MG tablet Take 2 tablets (650 mg total) by mouth every 6 (six) hours as needed for mild pain (or Fever >/= 101). 05/21/14   Christiane Ha, MD   No Known Allergies CBC:    Component Value Date/Time   WBC 12.1* 11/30/2014 0441   HGB 8.5* 11/30/2014 0441   HCT 27.3* 11/30/2014 0441   PLT 258 11/30/2014 0441   MCV 89.2 11/30/2014 0441   NEUTROABS 3.8 11/28/2014 1957   LYMPHSABS 1.5 11/28/2014 1957   MONOABS 0.3 11/28/2014 1957   EOSABS 0.1 11/28/2014 1957   BASOSABS 0.0 11/28/2014 1957   Comprehensive Metabolic Panel:    Component Value Date/Time   NA 146* 11/29/2014 0754   K 3.8 11/29/2014 0754   CL 109 11/29/2014 0754   CO2 29 11/29/2014 0754   BUN 13 11/29/2014 0754   CREATININE 0.80 11/29/2014 0754   GLUCOSE 109* 11/29/2014 0754   CALCIUM 8.1* 11/29/2014 0754   AST 12* 11/28/2014 1957   ALT 8* 11/28/2014 1957   ALKPHOS 57 11/28/2014 1957   BILITOT 0.3 11/28/2014 1957   PROT 6.1* 11/28/2014 1957   ALBUMIN 2.3* 11/28/2014 1957    Physical Exam: Vital Signs: BP 102/56 mmHg  Pulse 76  Temp(Src) 98 F (36.7 C) (Axillary)  Resp 20  Ht  (1.651 m)  Wt 63.9 kg (140 lb 14 oz)  BMI 23.44 kg/m2  SpO2 96% SpO2: SpO2: 96 % O2 Device: O2 Device: Nasal Cannula O2 Flow Rate: O2 Flow Rate (L/min): 2 L/min Intake/output summary:    Intake/Output Summary (Last 24 hours) at 12/01/14 0954 Last data filed at 12/01/14 0447  Gross per 24 hour  Intake      0 ml  Output   1825 ml  Net  -1825 ml   LBM: Last BM Date: 11/30/14 Baseline Weight: Weight: 63.9 kg (140 lb 14 oz) Most recent weight: Weight: 63.9 kg (140 lb 14 oz)  Exam Findings:   General: Elderly female lying in bed on nasal canula, opens eyes to voice or tactile stimulation, no acute respiratory distress noted, cachectic  HEENT: Sclera nonicteric, mucous membranes tacky  Chest: Coarse breath sounds bilaterally   CV: Regular rate and rhythm, no murmur appreciated  GI: Soft, nondistended, nontender, bowel sounds present  Musculoskeletal: Warm, no edema  CNS: Opens eyes but does not follow any commands, nonverbal         Palliative Performance Scale: 10-20%               Additional Data Reviewed: Recent Labs     11/28/14  1957   11/29/14  0754   11/29/14  2330  11/30/14  0441  WBC  5.6   < >  15.5*   < >  14.0*  12.1*  HGB  11.0*   < >  10.2*   < >  8.6*  8.5*  PLT  219   < >  289   < >  273  258  NA  145   --   146*   --    --    --   BUN  14   --   13   --    --    --   CREATININE  0.93   --   0.80   --    --    --    < > = values in this interval not displayed.     Time In: 0850 Time Out: 1005  Time Total: 75 Greater than 50%  of this time was spent counseling and coordinating care related to the above assessment and plan.  Signed by: Romie Minus, MD  Romie Minus, MD  12/01/2014, 9:54 AM  Please contact Palliative Medicine Team phone at [phone] for questions and concerns.

## 2014-12-01 NOTE — Progress Notes (Signed)
Nutrition Brief Note  Chart reviewed. Pt now transitioning to comfort care.  No further nutrition interventions warranted at this time.  Please re-consult as needed.   [name], RD, LDN, CDE Pager: [phone] After hours Pager: [phone]  

## 2014-12-02 DIAGNOSIS — K254 Chronic or unspecified gastric ulcer with hemorrhage: Secondary | ICD-10-CM

## 2014-12-02 DIAGNOSIS — R1310 Dysphagia, unspecified: Secondary | ICD-10-CM

## 2014-12-02 DIAGNOSIS — Z515 Encounter for palliative care: Secondary | ICD-10-CM | POA: Insufficient documentation

## 2014-12-02 DIAGNOSIS — I69398 Other sequelae of cerebral infarction: Secondary | ICD-10-CM

## 2014-12-02 LAB — TYPE AND SCREEN
ABO/RH(D): O POS
Antibody Screen: NEGATIVE
UNIT DIVISION: 0
UNIT DIVISION: 0

## 2014-12-02 LAB — GLUCOSE, CAPILLARY
GLUCOSE-CAPILLARY: 134 mg/dL — AB (ref 65–99)
Glucose-Capillary: 127 mg/dL — ABNORMAL HIGH (ref 65–99)
Glucose-Capillary: 128 mg/dL — ABNORMAL HIGH (ref 65–99)

## 2014-12-02 MED ORDER — MORPHINE SULFATE (CONCENTRATE) 10 MG/0.5ML PO SOLN: 5.0000 mg | ORAL | Status: AC | PRN

## 2014-12-02 MED ORDER — BISACODYL 10 MG RE SUPP: 10.0000 mg | Freq: Every day | RECTAL | Status: AC | PRN

## 2014-12-02 MED ORDER — LORAZEPAM 2 MG/ML PO CONC: 1.0000 mg | ORAL | Status: AC | PRN

## 2014-12-02 MED ORDER — SCOPOLAMINE 1 MG/3DAYS TD PT72: 1.0000 | MEDICATED_PATCH | TRANSDERMAL | Status: AC

## 2014-12-02 MED ORDER — GLYCOPYRROLATE 1 MG PO TABS: 1.0000 mg | ORAL_TABLET | ORAL | Status: AC | PRN

## 2014-12-02 MED ORDER — HALOPERIDOL 0.5 MG PO TABS: 0.5000 mg | ORAL_TABLET | ORAL | Status: AC | PRN

## 2014-12-02 NOTE — Discharge Summary (Addendum)
Physician Discharge Summary  [name] ZOX:[id number] DOB: 1917-06-10 DOA: 11/28/2014  PCP: [name], MD  Admit date: 11/28/2014 Discharge date: 12/02/2014  Time spent: 35 minutes  Recommendations for Outpatient Follow-up:  1. [name] now on full Hospice trajectory with intent of goals of care being comfort only 2. [name] will be transferred or discharged to [facility] place a standing hospice 3. Simplified many medications this admission   Discharge Diagnoses:  Principal Problem:   GIB (gastrointestinal bleeding) Active Problems:   Diabetic neuropathy   Dyslipidemia   CKD (chronic kidney disease) stage 3, GFR 30-59 ml/min   Hemiparesis and alteration of sensations as late effects of stroke   Dysphagia   Pulmonary hypertension   Chronic diastolic heart failure, NYHA class 1   Diabetes type 2, uncontrolled   Prolonged QT interval   CVA, old, hemiparesis   Pressure ulcer   Discharge Condition: Guarded  Diet recommendation: Comfort feeds  Filed Weights   11/29/14 0300  Weight: 63.9 kg (140 lb 14 oz)    History of present illness:  [name] with history of hypertension, hyperlipidemia, diabetes mellitus, depression, carotid artery stenosis, history of stroke with left facial droop and sided weakness, chronic kidney disease stage III, diastolic CHF, dementia, GERD, who presented with dark stools (total 5 episodes) without associated nausea, vomiting, abdominal pain, chest pain, shortness of breath, fevers or chills.  On presentation she was hypotensive with systolic blood pressure in the 80s, hemoglobin of 11 (around baseline), lactic acid 4, bradycardic. [facility] consulted from the ED. Subsequent hemoglobin dropped to 8. [name] transfused 1 unit PRBC and 2 units of platelets and admitted to stepdown unit and gastroenterology was consulted Long discussions were carried out with family about goals of care  Hospital Course:   Hospice eligible Palliative  care was consulted and made recommendations We have simplified many medications and discontinued her statin, insulin, gabapentin, Aricept, aspirin, Zoloft She will be prescribed as new medications and given a hard prescription for Ativan under the tongue, Haldol, scopolamine, Robinul and morphine sulfate She will be discharged to the freestanding hospice facility [facility] place when bed available hopefully today  Acute hypoxic respiratory failure Secondary to underlying aspiration pneumonia and volume overload in the setting of IV fluids and transfusions. On NRB with increased secretions.  Oliguria despite fluids and lasix.  Discussed with radiologist on the phone who recommends that could be some mediastinal widening and follow-up with contrast chest CT.  [name] hemodynamically stable at this time and will not pursue further imaging. She received antibiotics for 2 days IV and these were discontinued after palliative care at seen the [name] Has poor overall prognosis.  Will plan for full comfort. Discussed with daughter on the phone and explained.  She understands pts poor prognosis and agrees for comfort measures. transferred to medical floor.  continue NRB for now. No further blood draws.  Prn IV morphine for comfort and SOB.  Scopolamine patch. Prn lasix for comfort. consult palliative care for symptomatic management. Will be hopefully d/c to Hospice free-standing   GI bleed with symptomatic acute blood loss anemia Drop in hemoglobin to 8. Received one unit PRBC and 2 units of platelets?? Hydrated in ED with NS.  IV PPI  Given initiallyNot a candidate for procedure or interventions given her resp status as per gastroenterology   Aspiration pneumonia As outlined on chest x-ray. on empiric Rocephin and azithromycin. - i have d/c Abx 9/28 - simplified medications   Chronic kidney disease Stable  Chronic  diastolic CHF Presented with hypotension but developed fluid  overload.  No further work-up  Type 2 diabetes mellitus CBG of 42 this morning. Discontinue her home insulin. Monitor  History of stroke Aspirin on hold.   Consultations:  Palliative care and hospice  Discharge Exam: Filed Vitals:   12/01/14 1500  BP: 116/68  Pulse: 75  Temp: 98.8 F (37.1 C)  Resp: 23    General: Minimally responsive on oxygen Cardiovascular: S1-S2 no murmur rub or gallop Respiratory: Crackles bilaterally No lower extremity edema  Discharge Instructions    Current Discharge Medication List    START taking these medications   Details  bisacodyl (DULCOLAX) 10 MG suppository Place 1 suppository (10 mg total) rectally daily as needed for moderate constipation. Qty: 12 suppository, Refills: 0    glycopyrrolate (ROBINUL) 1 MG tablet Take 1 tablet (1 mg total) by mouth every 4 (four) hours as needed (excessive secretions). Qty: 30 tablet, Refills: 0    haloperidol (HALDOL) 0.5 MG tablet Take 1 tablet (0.5 mg total) by mouth every 4 (four) hours as needed for agitation (or delirium). Qty: 30 tablet, Refills: 0    LORazepam (ATIVAN) 2 MG/ML concentrated solution Place 0.5 mLs (1 mg total) under the tongue every 4 (four) hours as needed for anxiety. Qty: 30 mL, Refills: 0    Morphine Sulfate (MORPHINE CONCENTRATE) 10 MG/0.5ML SOLN concentrated solution Take 0.25 mLs (5 mg total) by mouth every 2 (two) hours as needed for moderate pain (or dyspnea). Qty: 42 mL, Refills: 0    scopolamine (TRANSDERM-SCOP) 1 MG/3DAYS Place 1 patch (1.5 mg total) onto the skin every 3 (three) days. Qty: 10 patch, Refills: 12      CONTINUE these medications which have NOT CHANGED   Details  albuterol (PROVENTIL HFA;VENTOLIN HFA) 108 (90 BASE) MCG/ACT inhaler Inhale 2 puffs into the lungs every 6 (six) hours as needed for wheezing or shortness of breath.    allopurinol (ZYLOPRIM) 100 MG tablet Take 100 mg by mouth 2 (two) times daily.    furosemide (LASIX) 20 MG tablet  Take 20 mg by mouth daily as needed for fluid.     risperiDONE (RISPERDAL) 0.5 MG tablet Take 0.5 mg by mouth 2 (two) times daily. For hallucinations    Wheat Dextrin (BENEFIBER) CHEW Chew 1 tablet by mouth daily.    acetaminophen (TYLENOL) 325 MG tablet Take 2 tablets (650 mg total) by mouth every 6 (six) hours as needed for mild pain (or Fever >/= 101).      STOP taking these medications     aspirin EC 81 MG EC tablet      atorvastatin (LIPITOR) 20 MG tablet      cetirizine (ZYRTEC) 10 MG tablet      donepezil (ARICEPT) 10 MG tablet      gabapentin (NEURONTIN) 300 MG capsule      insulin lispro protamine-lispro (HUMALOG 75/25 MIX) (75-25) 100 UNIT/ML SUSP injection      Maltodextrin-Xanthan Gum (RESOURCE THICKENUP CLEAR) POWD      Multiple Vitamin (MULTIVITAMIN WITH MINERALS) TABS tablet      sertraline (ZOLOFT) 50 MG tablet        No Known Allergies    The results of significant diagnostics from this hospitalization (including imaging, microbiology, ancillary and laboratory) are listed below for reference.    Significant Diagnostic Studies: Dg Chest Port 1 View  11/29/2014   ADDENDUM REPORT: 11/29/2014 09:56 ADDENDUM: These results were called by telephone at the time of interpretation on 11/29/2014  at 9:20 am to Dr. Eddie North , who verbally acknowledged these results. Electronically Signed   By: Elberta Fortis M.D.   On: 11/29/2014 09:56  11/29/2014   CLINICAL DATA:  Gurgling sounds in throat.  EXAM: PORTABLE CHEST 1 VIEW  COMPARISON:  10/16/2014  FINDINGS: Lungs are hypoinflated with opacification over the left perihilar region left upper lung with obscuration of the left lateral border of the aortic arch. Mild opacification in the left retrocardiac region. Calcified AP window lymph node unchanged. No evidence of effusion or pneumothorax. Mild stable cardiomegaly. Calcified plaque over the aortic arch unchanged. Remainder of the exam is unchanged.  IMPRESSION:  Opacification over left perihilar region and left upper lung with obscuration left lateral border of the aortic arch. Mild left retrocardiac opacification. Findings likely due to pneumonia, however would recommend contrast-enhanced chest CT to exclude acute aortic process.  Mild stable cardiomegaly.  Electronically Signed: By: Elberta Fortis M.D. On: 11/29/2014 08:41    Microbiology: Recent Results (from the past 240 hour(s))  MRSA PCR Screening     Status: None   Collection Time: 11/29/14  2:44 AM  Result Value Ref Range Status   MRSA by PCR NEGATIVE NEGATIVE Final    Comment:        The GeneXpert MRSA Assay (FDA approved for NASAL specimens only), is one component of a comprehensive MRSA colonization surveillance program. It is not intended to diagnose MRSA infection nor to guide or monitor treatment for MRSA infections.      Labs: Basic Metabolic Panel:  Recent Labs Lab 11/28/14 1957 11/29/14 0754  NA 145 146*  K 3.8 3.8  CL 106 109  CO2 31 29  GLUCOSE 80 109*  BUN 14 13  CREATININE 0.93 0.80  CALCIUM 8.5* 8.1*   Liver Function Tests:  Recent Labs Lab 11/28/14 1957  AST 12*  ALT 8*  ALKPHOS 57  BILITOT 0.3  PROT 6.1*  ALBUMIN 2.3*   No results for input(s): LIPASE, AMYLASE in the last 168 hours. No results for input(s): AMMONIA in the last 168 hours. CBC:  Recent Labs Lab 11/28/14 1957  11/29/14 0754 11/29/14 1425 11/29/14 1554 11/29/14 2330 11/30/14 0441  WBC 5.6  < > 15.5* 15.8* 17.2* 14.0* 12.1*  NEUTROABS 3.8  --   --   --   --   --   --   HGB 11.0*  < > 10.2* 8.9* 8.6* 8.6* 8.5*  HCT 36.0  < > 33.0* 29.6* 27.8* 28.2* 27.3*  MCV 89.6  < > 90.7 90.2 90.3 90.1 89.2  PLT 219  < > 289 274 266 273 258  < > = values in this interval not displayed. Cardiac Enzymes: No results for input(s): CKTOTAL, CKMB, CKMBINDEX, TROPONINI in the last 168 hours. BNP: BNP (last 3 results)  Recent Labs  05/18/14 2056 08/13/14 1230 11/29/14 0754  BNP 83.8  158.9* 129.6*    ProBNP (last 3 results) No results for input(s): PROBNP in the last 8760 hours.  CBG:  Recent Labs Lab 12/01/14 1727 12/01/14 1958 12/02/14 0024 12/02/14 0430 12/02/14 0716  GLUCAP 123* 116* 128* 134* 127*       Signed:  Rhetta Mura  [facility] 12/02/2014, 7:59 AM

## 2014-12-02 NOTE — Progress Notes (Signed)
Daily Progress Note   Patient Name: [name]       Date: 12/02/2014 DOB: 1917-12-17  Age: 81 y.o. MRN#: [id number] Attending Physician: Rhetta Mura, MD Primary Care Physician: [name], MD Admit Date: 11/28/2014  Reason for Consultation/Follow-up: Establishing goals of care and Terminal care  Subjective: 81 year old female with history of hypertension, hyperlipidemia, diabetes mellitus, depression, carotid artery stenosis, history of stroke with left facial droop and left sided weakness, chronic kidney disease stage III, diastolic CHF, dementia, GERD, who presented with dark stools (total 5 episodes).  Her hospital course has been complicated by acute hypoxic respiratory failure, CAP versus aspiration pneumonia, and anemia related to her GI bleed. Most recent hemoglobin was stable at 8.5. Plan for comfort care with transition to [facility] today.  Interval Events: Resting comfortably during my examination.  Remains nonverbal.  Makes eye contact, but no meaningful communication.  Length of Stay: 4 days  Current Medications: Scheduled Meds:  . scopolamine  1 patch Transdermal Q72H  . sodium chloride  3 mL Intravenous Q12H    Continuous Infusions:    PRN Meds: sodium chloride, acetaminophen **OR** acetaminophen, albuterol, antiseptic oral rinse, bisacodyl, dextrose, glycopyrrolate **OR** glycopyrrolate **OR** glycopyrrolate, haloperidol **OR** haloperidol **OR** haloperidol lactate, LORazepam **OR** LORazepam **OR** LORazepam, morphine injection, morphine CONCENTRATE **OR** morphine CONCENTRATE, ondansetron **OR** ondansetron (ZOFRAN) IV, polyvinyl alcohol, sodium chloride  Palliative Performance Scale: 20%     Vital Signs: BP 116/68 mmHg  Pulse 75  Temp(Src) 98.8 F (37.1 C) (Axillary)  Resp 23  Ht  (1.651 m)  Wt 63.9 kg (140 lb 14 oz)  BMI 23.44 kg/m2  SpO2 97% SpO2: SpO2: 97 % O2 Device: O2 Device: Nasal Cannula O2 Flow Rate: O2 Flow Rate  (L/min): 2 L/min  Intake/output summary:  Intake/Output Summary (Last 24 hours) at 12/02/14 0932 Last data filed at 12/02/14 0518  Gross per 24 hour  Intake      0 ml  Output    950 ml  Net   -950 ml   LBM:   Baseline Weight: Weight: 63.9 kg (140 lb 14 oz) Most recent weight: Weight: 63.9 kg (140 lb 14 oz)  Physical Exam:  General: Elderly female lying in bed, opens eyes to voice, no acute respiratory distress noted  HEENT: Sclera nonicteric, mucous membranes tacky  GI: Nondistended  Musculoskeletal: Warm, no edema  CNS: Opens eyes but does not follow any commands, nonverbal          Additional Data Reviewed: Recent Labs     11/29/14  2330  11/30/14  0441  WBC  14.0*  12.1*  HGB  8.6*  8.5*  PLT  273  258     Problem List:  Patient Active Problem List   Diagnosis Date Noted  . Pressure ulcer 11/29/2014  . GIB (gastrointestinal bleeding) 11/28/2014  . LVH (left ventricular hypertrophy)   . CVA, old, hemiparesis   . Diabetes type 2, uncontrolled   . Prolonged QT interval   . HCAP (healthcare-associated pneumonia) 08/13/2014  . Dysphagia 08/13/2014  . Aspiration pneumonia 08/13/2014  . Pulmonary hypertension 08/13/2014  . Leukocytosis 08/13/2014  . Chronic diastolic heart failure, NYHA class 1 08/13/2014  . Ulcer of right heel 08/13/2014  . Dementia 08/13/2014  . Sepsis due to pneumonia 08/13/2014  . Acute respiratory failure with hypoxemia 05/18/2014  . Hemiparesis and alteration of sensations as late effects of stroke 12/09/2013  . Acute lacunar infarction 06/05/2013  . Dyslipidemia 06/05/2013  . CKD (chronic kidney  disease) stage 3, GFR 30-59 ml/min 06/05/2013  . Diabetic neuropathy      Palliative Care Assessment & Plan    Code Status:  DNR  Goals of Care:  To [facility] today  Symptom Management:  Comfortable on exam.  Recommend comfort meds on discharge: Ativan, Haldol, scopolamine, Robinul and morphine sulfate  Palliative  Prophylaxis:  Dulcolax as needed  Psycho-social/Spiritual:  Desire for further Chaplaincy support:no   Prognosis: < 2 weeks Discharge Planning: Hospice facility   Care plan was discussed with Misty Stanley from [facility]  Thank you for allowing the Palliative Medicine Team to assist in the care of this patient.   Time In: 0915 Time Out: 0930 Total Time 15 Prolonged Time Billed No    Greater than 50%  of this time was spent counseling and coordinating care related to the above assessment and plan.   Romie Minus, MD  12/02/2014, 9:32 AM  Please contact Palliative Medicine Team phone at [phone] for questions and concerns.

## 2014-12-02 NOTE — Progress Notes (Signed)
PTAR present to transfer patient to [facility]. Report called and given to nurse Judeth Cornfield. All documents sent with patient. Patient ready for discharge.

## 2014-12-02 NOTE — Progress Notes (Addendum)
Inpatient [facility] Rm 6N 08 [facility] RN visit. GIP related admission to [facility] Dx: Alzheimer's Dementia, Pt. Is a DNR code status. Pt.lying in bed and awakens to her name. She does not respond to commands nor appears able to talk. O2 still at 2 L n/c with regular respirations. Pt. advised she would be moving to [facility] later today. Foley catheter will remain in place.  Please call with any questions.  RN please call report to [facility] at [phone]. CSW please fax discharge summary to [phone].    Sharen Heck RN [facility] Liaison [phone]

## 2014-12-02 NOTE — Clinical Social Work Note (Addendum)
CSW received request for patient to have transportation set up for discharge to [facility].  CSW faxed discharge summary to [facility], scheduled transportation and informed [facility] hospital Liason Sharen Heck that transportation has been scheduled.  CSW to sign off, please reconsult if other social work needs arise.  Ervin Knack. Anterhaus, MSW, LCSWA [phone] 12/02/2014 11:11 AM

## 2015-01-04 DEATH — deceased

## 2016-05-06 IMAGING — DX DG CHEST 2V
2 series · 2 of 2 positions shown · non-contrast
Comparison: 04/10/2014.

CLINICAL DATA: Shortness of breath and wheezing.

EXAM:
CHEST  2 VIEW

[chest lat]
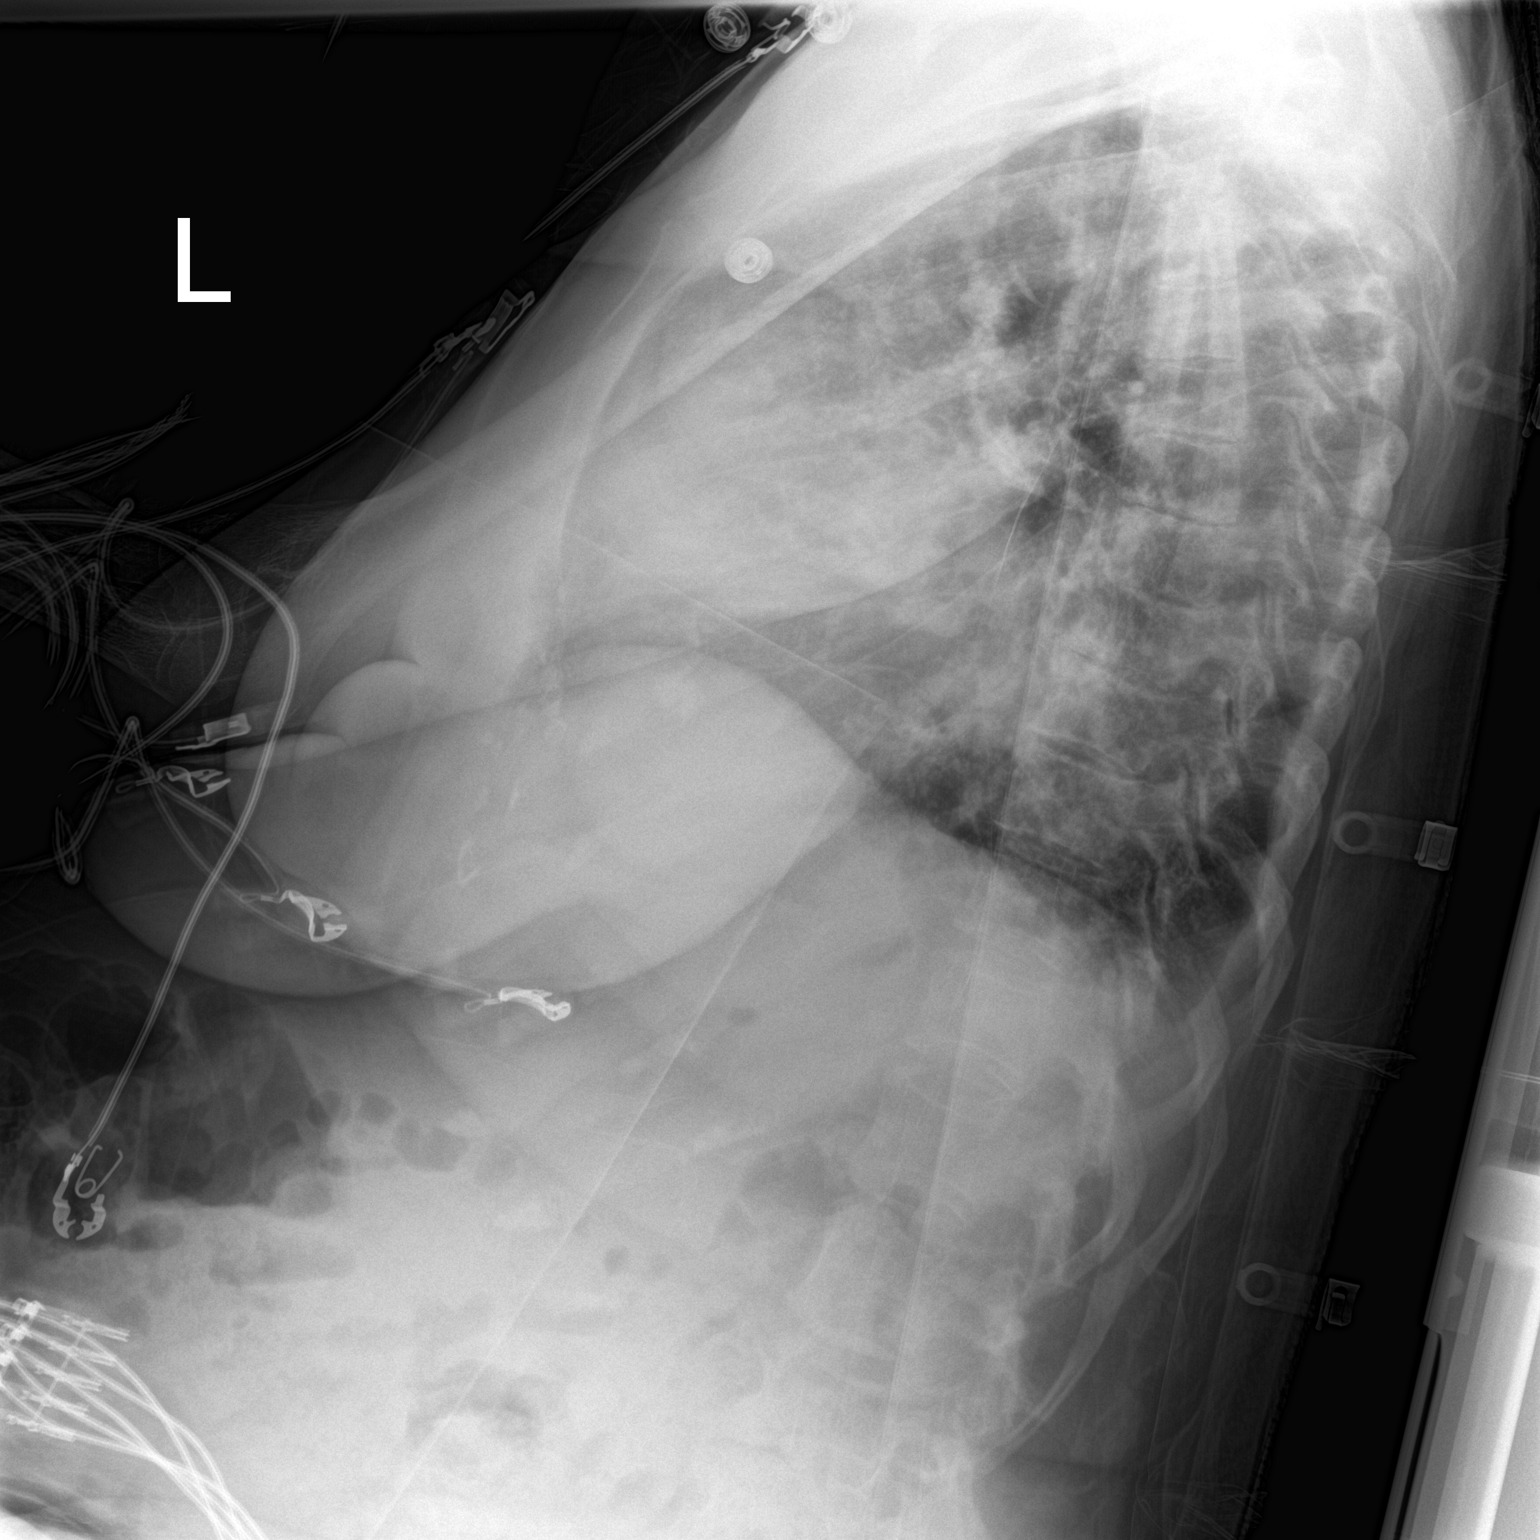

[chest ap]
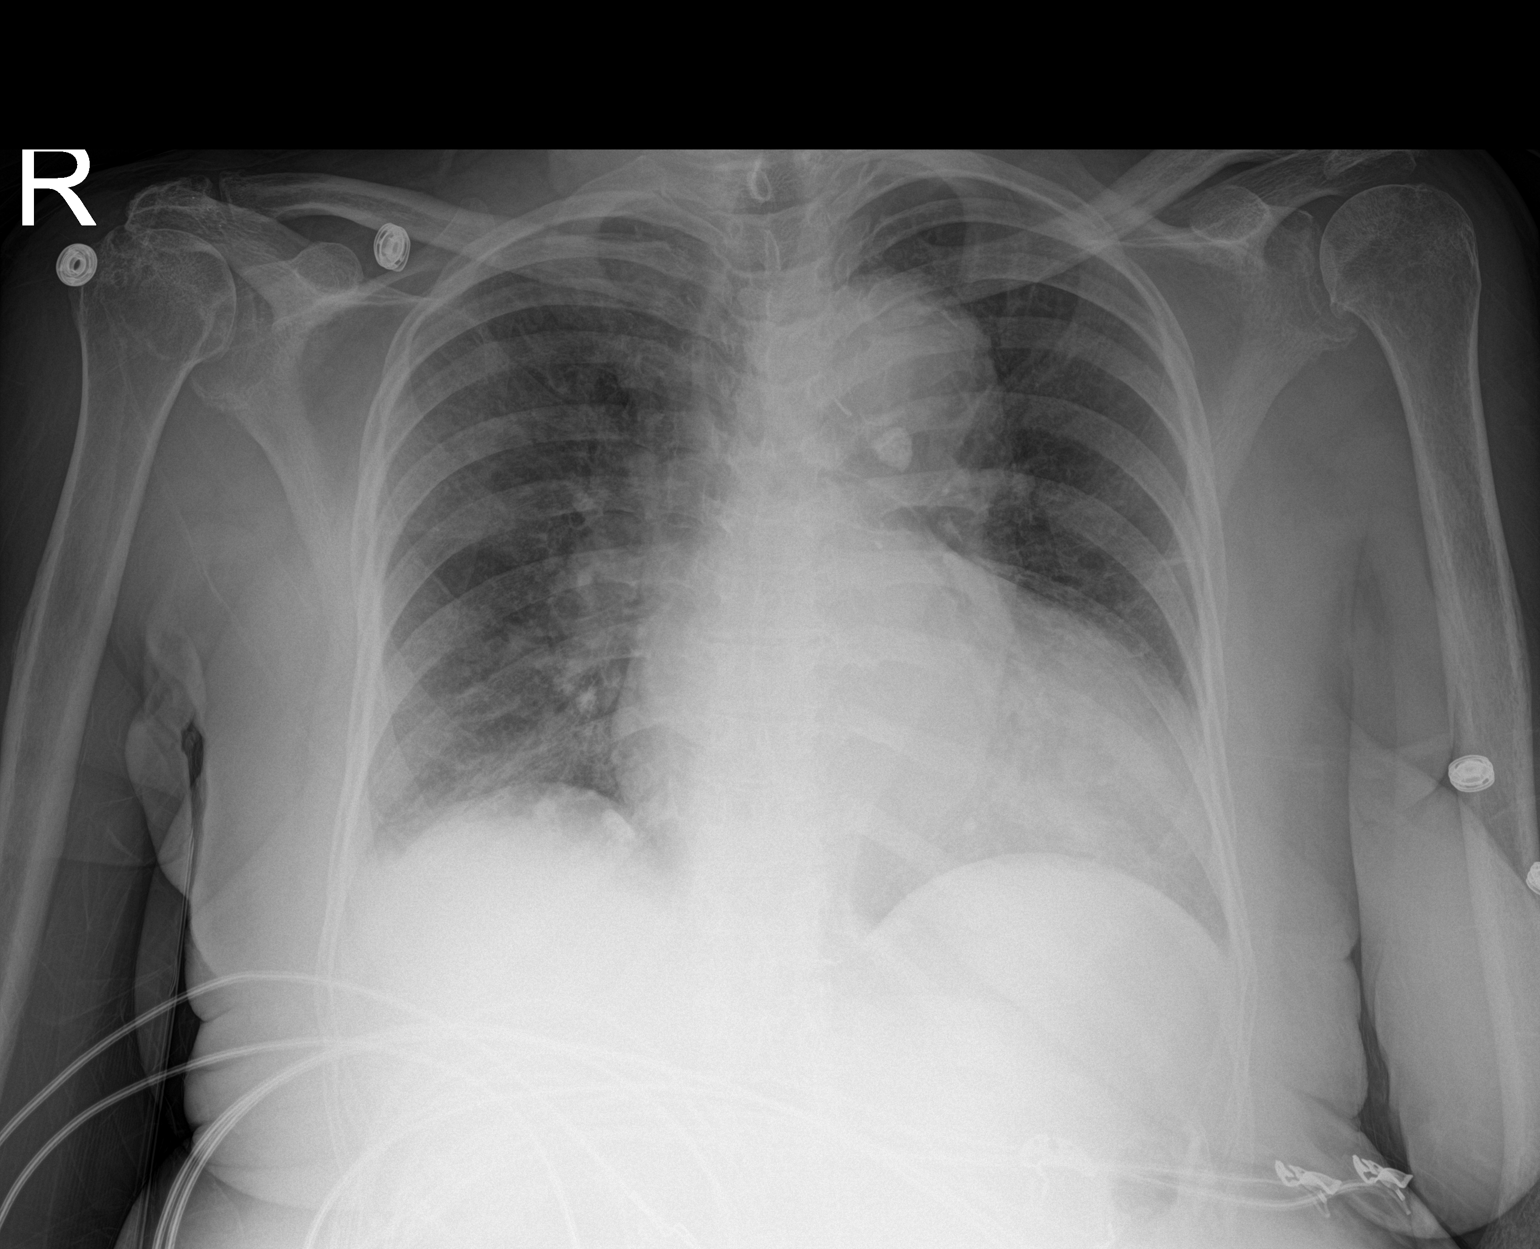

[2 of 2 positions shown; findings below may reference images not displayed]

FINDINGS: The heart is enlarged but stable. There is marked tortuosity,
ectasia and calcification of the thoracic aorta. Stable calcified
mediastinal lymph node. Chronic lung changes without definite acute
overlying pulmonary process. The bony thorax is grossly intact.
IMPRESSION: Stable cardiac enlargement and tortuous ectatic thoracic aorta.

Chronic lung changes without definite acute overlying pulmonary
process.

## 2016-05-06 IMAGING — CT CT HEAD W/O CM
2 series · 15 of 30 positions shown, 19 images · non-contrast
Comparison: CT head 12/09/2014.  MRI and MRA brain 05/31/2013

CLINICAL DATA: Slurred speech and generalized weakness, onset 3
days ago. Difficulty with transverse. Decreased urinary output.
Stroke symptoms.

EXAM:
CT HEAD WITHOUT CONTRAST
TECHNIQUE: Contiguous axial images were obtained from the base of the skull
through the vertex without intravenous contrast.

[Series 301: head w/o, idose (1) · axial · non-contrast · 0.49mm/px · z∈[+101,+226]mm · 13 of 31 slices shown, 17 images]
[im 3/31  brain]
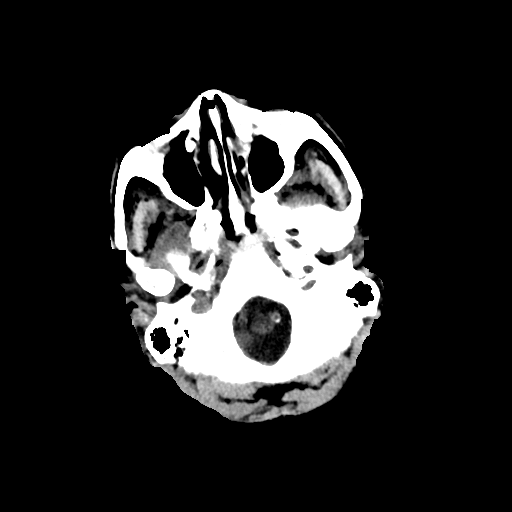
[im 3/31  bone]
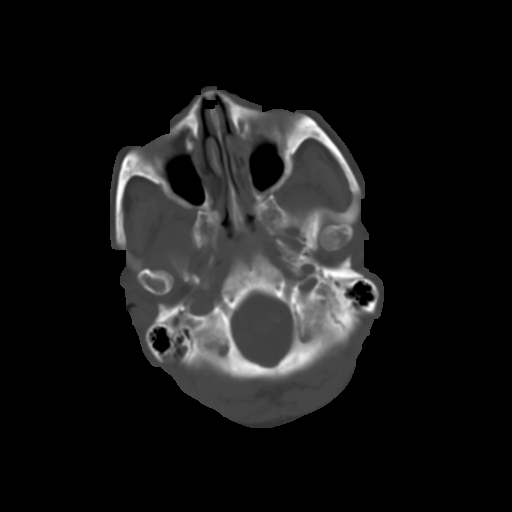
[im 5/31  brain]
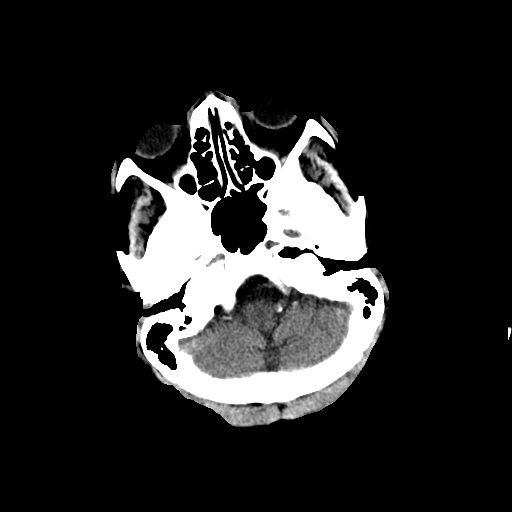
[im 7/31  brain]
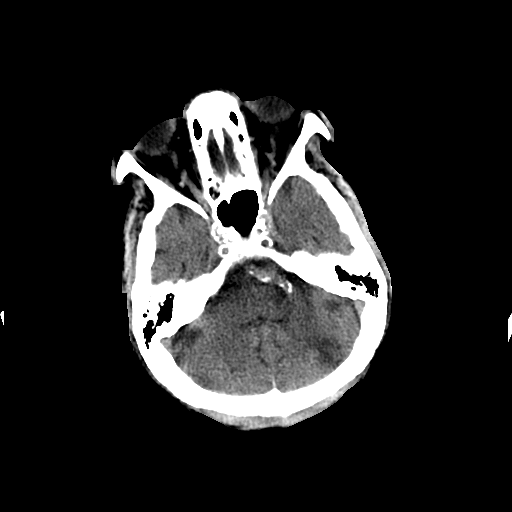
[im 9/31  brain]
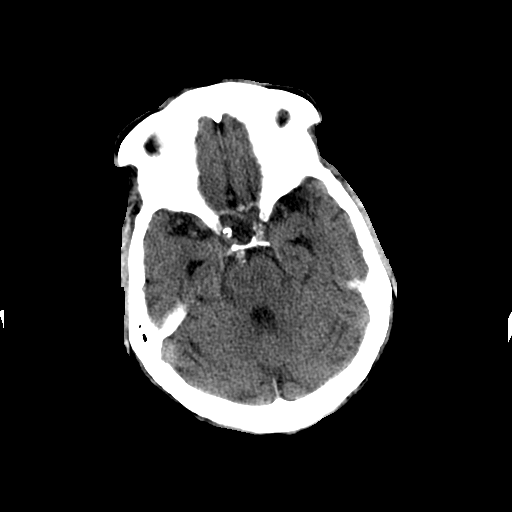
[im 11/31  brain]
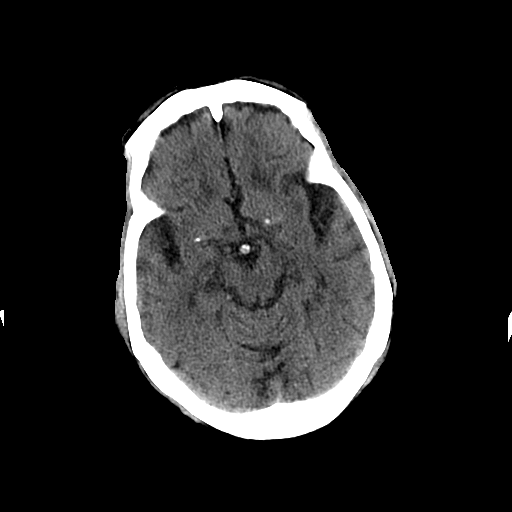
[im 11/31  bone]
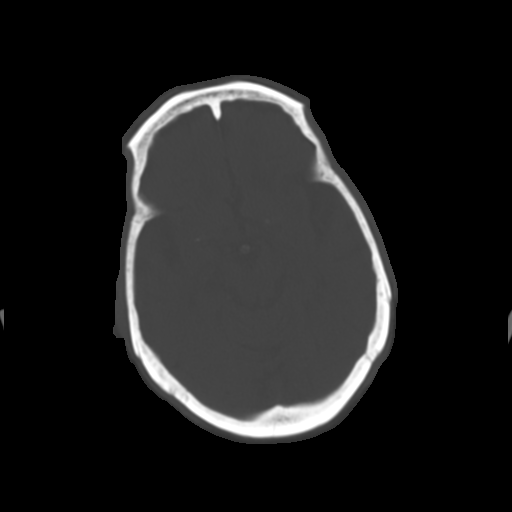
[im 13/31  brain]
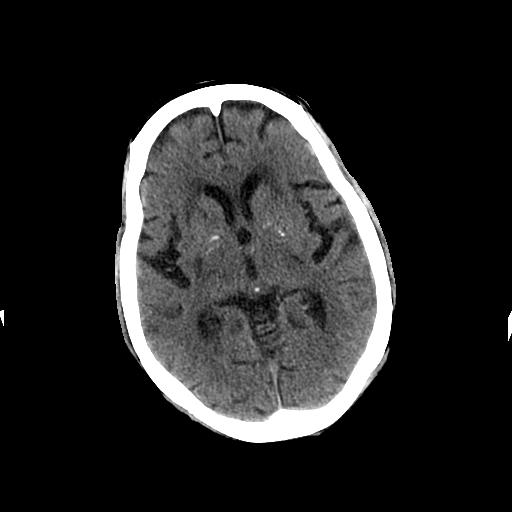
[im 16/31  brain]
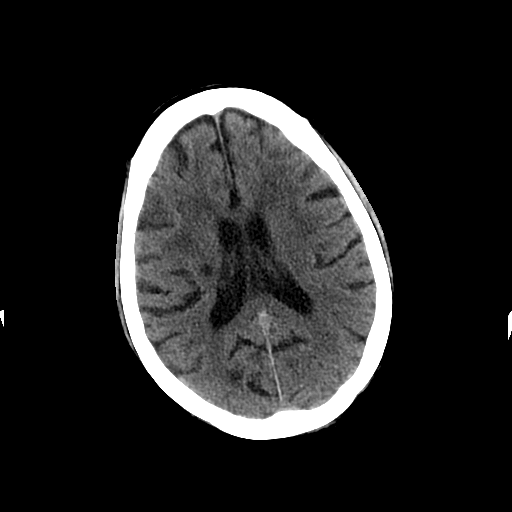
[im 18/31  brain]
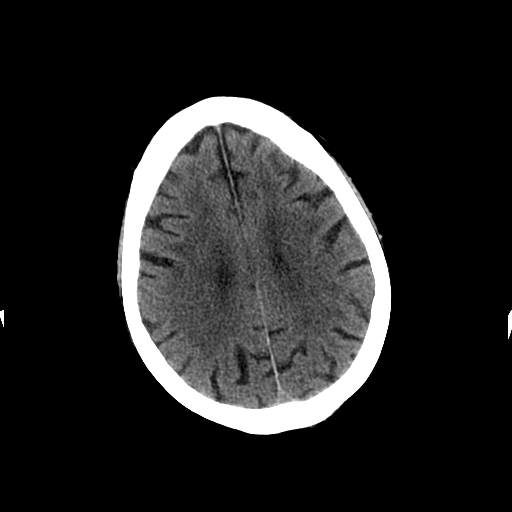
[im 20/31  brain]
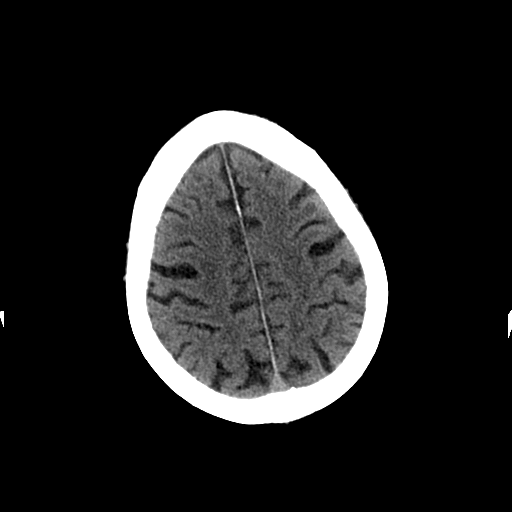
[im 20/31  bone]
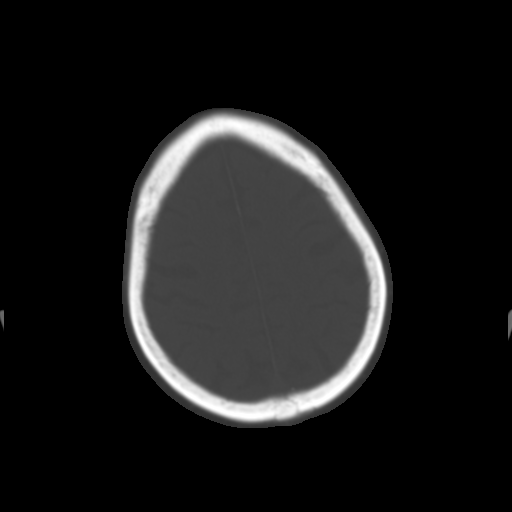
[im 22/31  brain]
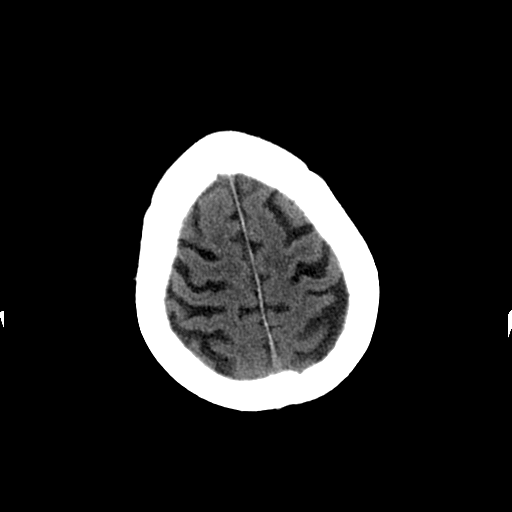
[im 24/31  brain]
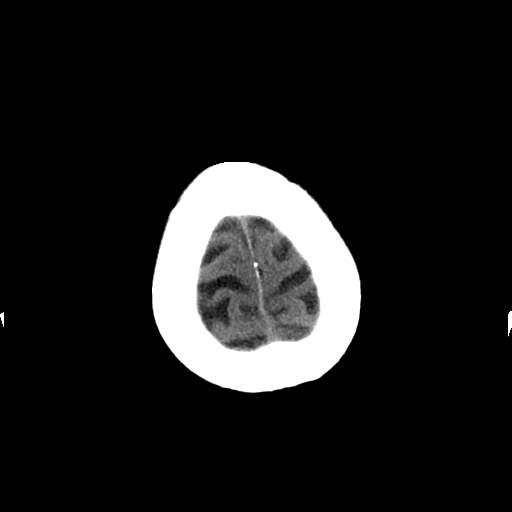
[im 26/31  brain]
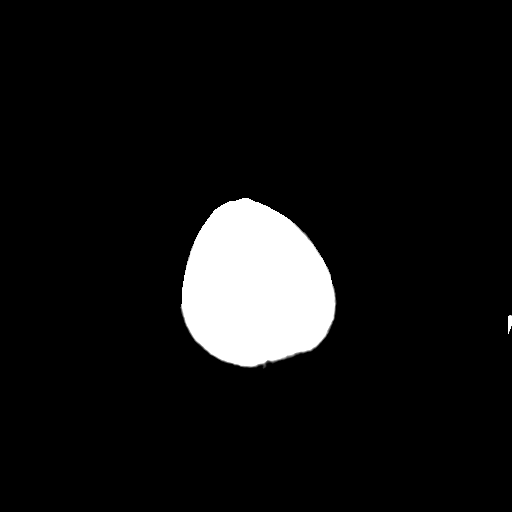
[im 28/31  brain]
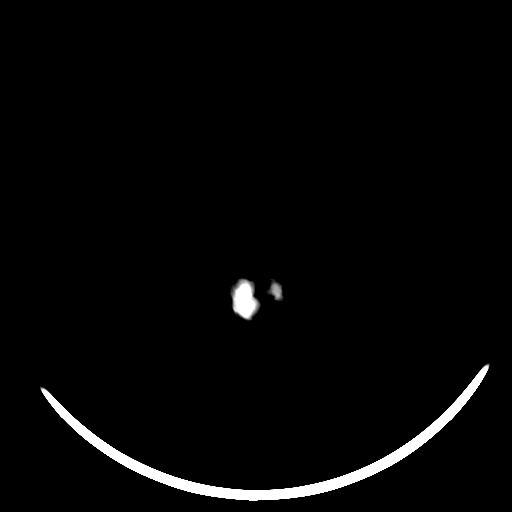
[im 28/31  bone]
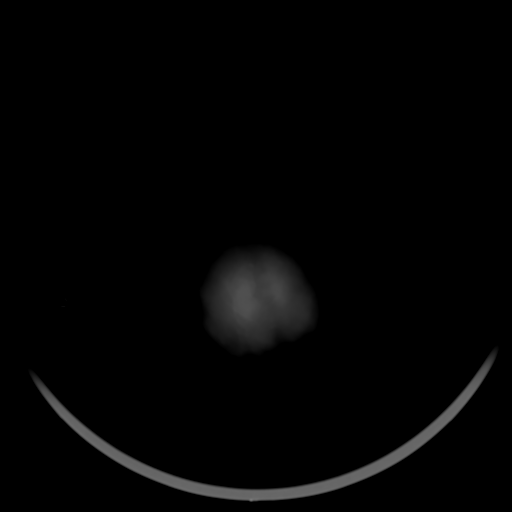

[Series 302: head w/o bone, idose (1) · axial · non-contrast · 0.49mm/px · z∈[+101,+121]mm · 2 of 31 slices shown]
[im 3/31  bone]
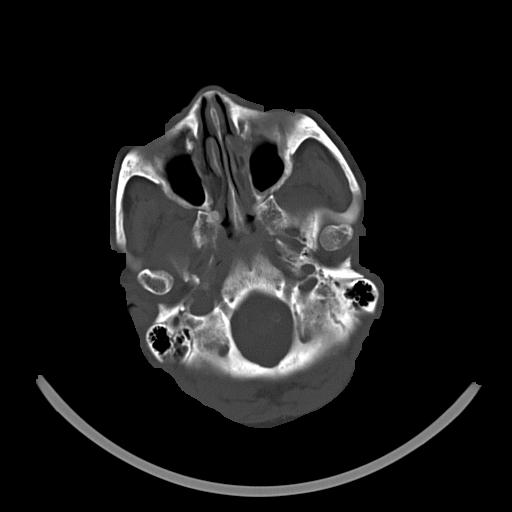
[im 7/31  bone]
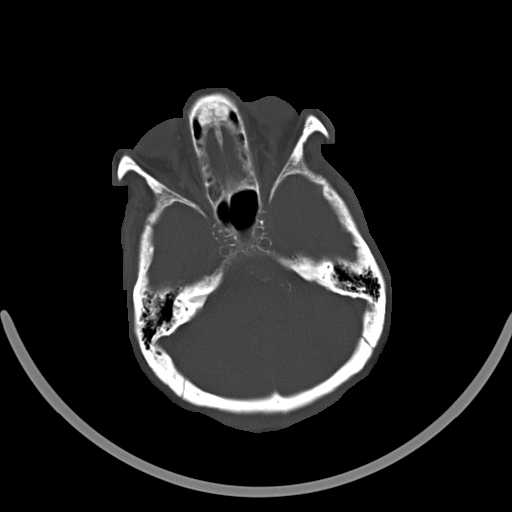

[15 of 30 positions shown; findings below may reference images not displayed]

FINDINGS: Diffuse cerebral atrophy. Mild ventricular dilatation consistent
with central atrophy. Low-attenuation changes throughout the deep
white matter consistent with small vessel ischemia. Old lacunar
infarct in the right deep white matter. Probable old cerebellar
infarcts. No mass effect or midline shift. No abnormal extra-axial
fluid collections. Gray-white matter junctions are distinct. Basal
cisterns are not effaced. No evidence of acute intracranial
hemorrhage. Calcification in the basal ganglia. Prominent vascular
calcifications in the internal carotid and basilar arteries.
Tortuous basilar artery. 6 mm aneurysm at the basilar tip
corresponding to known aneurysm shown on previous MRA. Mucosal
thickening in the paranasal sinuses with partial opacification of
the left maxillary antrum. Mastoid air cells are not opacified.
Calvarium appears intact.
IMPRESSION: No acute intracranial abnormalities. Chronic atrophy and small
vessel ischemic changes. Basilar tip aneurysm unchanged since prior
studies. Chronic inflammatory changes in the paranasal sinuses.
